# Patient Record
Sex: Female | Born: 1955 | Race: Black or African American | Hispanic: No | Marital: Married | Smoking: Never smoker
Health system: Southern US, Community
[De-identification: ages and names within clinical notes are randomized; demographics above are authoritative.]

## PROBLEM LIST (undated history)

## (undated) DIAGNOSIS — T451X5A Adverse effect of antineoplastic and immunosuppressive drugs, initial encounter: Secondary | ICD-10-CM

## (undated) DIAGNOSIS — D509 Iron deficiency anemia, unspecified: Secondary | ICD-10-CM

## (undated) DIAGNOSIS — I10 Essential (primary) hypertension: Secondary | ICD-10-CM

## (undated) DIAGNOSIS — C7800 Secondary malignant neoplasm of unspecified lung: Secondary | ICD-10-CM

## (undated) DIAGNOSIS — G620 Drug-induced polyneuropathy: Secondary | ICD-10-CM

## (undated) DIAGNOSIS — C569 Malignant neoplasm of unspecified ovary: Secondary | ICD-10-CM

## (undated) DIAGNOSIS — C541 Malignant neoplasm of endometrium: Secondary | ICD-10-CM

## (undated) DIAGNOSIS — I82409 Acute embolism and thrombosis of unspecified deep veins of unspecified lower extremity: Secondary | ICD-10-CM

## (undated) DIAGNOSIS — Z803 Family history of malignant neoplasm of breast: Secondary | ICD-10-CM

## (undated) DIAGNOSIS — R188 Other ascites: Secondary | ICD-10-CM

## (undated) DIAGNOSIS — Z86018 Personal history of other benign neoplasm: Secondary | ICD-10-CM

## (undated) DIAGNOSIS — Z8742 Personal history of other diseases of the female genital tract: Secondary | ICD-10-CM

## (undated) DIAGNOSIS — C779 Secondary and unspecified malignant neoplasm of lymph node, unspecified: Secondary | ICD-10-CM

## (undated) DIAGNOSIS — Z923 Personal history of irradiation: Secondary | ICD-10-CM

## (undated) DIAGNOSIS — Z860101 Personal history of adenomatous and serrated colon polyps: Secondary | ICD-10-CM

## (undated) DIAGNOSIS — Z86010 Personal history of colonic polyps: Secondary | ICD-10-CM

## (undated) HISTORY — DX: Essential (primary) hypertension: I10

## (undated) HISTORY — PX: UMBILICAL HERNIA REPAIR: SHX196

## (undated) HISTORY — PX: TUBAL LIGATION: SHX77

## (undated) HISTORY — DX: Personal history of irradiation: Z92.3

## (undated) HISTORY — DX: Family history of malignant neoplasm of breast: Z80.3

---

## 1997-08-11 ENCOUNTER — Ambulatory Visit (HOSPITAL_COMMUNITY): Admission: RE | Admit: 1997-08-11 | Discharge: 1997-08-11 | Payer: Self-pay | Admitting: Obstetrics & Gynecology

## 2004-12-14 ENCOUNTER — Other Ambulatory Visit: Admission: RE | Admit: 2004-12-14 | Discharge: 2004-12-14 | Payer: Self-pay | Admitting: Obstetrics and Gynecology

## 2007-02-11 ENCOUNTER — Emergency Department (HOSPITAL_COMMUNITY): Admission: EM | Admit: 2007-02-11 | Discharge: 2007-02-11 | Payer: Self-pay | Admitting: Emergency Medicine

## 2008-01-02 HISTORY — PX: ABDOMINAL HYSTERECTOMY: SHX81

## 2008-01-31 ENCOUNTER — Inpatient Hospital Stay (HOSPITAL_COMMUNITY): Admission: EM | Admit: 2008-01-31 | Discharge: 2008-02-04 | Payer: Self-pay | Admitting: Emergency Medicine

## 2008-02-23 ENCOUNTER — Ambulatory Visit: Payer: Self-pay | Admitting: Internal Medicine

## 2008-02-25 ENCOUNTER — Ambulatory Visit: Payer: Self-pay | Admitting: *Deleted

## 2008-03-03 ENCOUNTER — Other Ambulatory Visit: Payer: Self-pay | Admitting: Emergency Medicine

## 2008-03-03 ENCOUNTER — Inpatient Hospital Stay (HOSPITAL_COMMUNITY): Admission: AD | Admit: 2008-03-03 | Discharge: 2008-03-05 | Payer: Self-pay | Admitting: Obstetrics and Gynecology

## 2008-03-18 ENCOUNTER — Ambulatory Visit: Payer: Self-pay | Admitting: Obstetrics & Gynecology

## 2008-03-18 ENCOUNTER — Inpatient Hospital Stay (HOSPITAL_COMMUNITY): Admission: RE | Admit: 2008-03-18 | Discharge: 2008-03-20 | Payer: Self-pay | Admitting: Obstetrics & Gynecology

## 2008-03-18 ENCOUNTER — Encounter: Payer: Self-pay | Admitting: Obstetrics & Gynecology

## 2008-03-18 HISTORY — PX: OTHER SURGICAL HISTORY: SHX169

## 2008-03-24 ENCOUNTER — Ambulatory Visit: Payer: Self-pay | Admitting: Oncology

## 2008-03-25 ENCOUNTER — Ambulatory Visit (HOSPITAL_COMMUNITY): Admission: RE | Admit: 2008-03-25 | Discharge: 2008-03-25 | Payer: Self-pay | Admitting: Obstetrics & Gynecology

## 2008-03-25 ENCOUNTER — Inpatient Hospital Stay (HOSPITAL_COMMUNITY): Admission: AD | Admit: 2008-03-25 | Discharge: 2008-03-29 | Payer: Self-pay | Admitting: Obstetrics & Gynecology

## 2008-04-19 LAB — COMPREHENSIVE METABOLIC PANEL
Albumin: 4.2 g/dL (ref 3.5–5.2)
CO2: 23 mEq/L (ref 19–32)
Glucose, Bld: 95 mg/dL (ref 70–99)
Potassium: 3.3 mEq/L — ABNORMAL LOW (ref 3.5–5.3)
Sodium: 141 mEq/L (ref 135–145)
Total Protein: 7.3 g/dL (ref 6.0–8.3)

## 2008-04-19 LAB — CA 125: CA 125: 16.2 U/mL (ref 0.0–30.2)

## 2008-04-19 LAB — CBC WITH DIFFERENTIAL/PLATELET
Basophils Absolute: 0 10*3/uL (ref 0.0–0.1)
Eosinophils Absolute: 0.2 10*3/uL (ref 0.0–0.5)
HGB: 11.9 g/dL (ref 11.6–15.9)
LYMPH%: 16.8 % (ref 14.0–49.7)
MONO#: 0.5 10*3/uL (ref 0.1–0.9)
NEUT#: 5.1 10*3/uL (ref 1.5–6.5)
Platelets: 368 10*3/uL (ref 145–400)
RBC: 4.51 10*6/uL (ref 3.70–5.45)
RDW: 17.7 % — ABNORMAL HIGH (ref 11.2–14.5)
WBC: 6.9 10*3/uL (ref 3.9–10.3)

## 2008-04-19 LAB — IRON AND TIBC: %SAT: 16 % — ABNORMAL LOW (ref 20–55)

## 2008-04-30 LAB — CBC WITH DIFFERENTIAL/PLATELET
Basophils Absolute: 0 10*3/uL (ref 0.0–0.1)
Eosinophils Absolute: 0.2 10*3/uL (ref 0.0–0.5)
HGB: 11.1 g/dL — ABNORMAL LOW (ref 11.6–15.9)
MCV: 76.4 fL — ABNORMAL LOW (ref 79.5–101.0)
MONO#: 0 10*3/uL — ABNORMAL LOW (ref 0.1–0.9)
MONO%: 0.8 % (ref 0.0–14.0)
NEUT#: 3.6 10*3/uL (ref 1.5–6.5)
Platelets: 216 10*3/uL (ref 145–400)
RDW: 16.1 % — ABNORMAL HIGH (ref 11.2–14.5)
WBC: 5.1 10*3/uL (ref 3.9–10.3)

## 2008-05-06 ENCOUNTER — Ambulatory Visit: Payer: Self-pay | Admitting: Oncology

## 2008-05-10 LAB — COMPREHENSIVE METABOLIC PANEL
AST: 11 U/L (ref 0–37)
Albumin: 3.9 g/dL (ref 3.5–5.2)
Alkaline Phosphatase: 99 U/L (ref 39–117)
Calcium: 9.3 mg/dL (ref 8.4–10.5)
Chloride: 107 mEq/L (ref 96–112)
Glucose, Bld: 102 mg/dL — ABNORMAL HIGH (ref 70–99)
Potassium: 3.4 mEq/L — ABNORMAL LOW (ref 3.5–5.3)
Sodium: 143 mEq/L (ref 135–145)
Total Protein: 6.8 g/dL (ref 6.0–8.3)

## 2008-05-10 LAB — CBC WITH DIFFERENTIAL/PLATELET
Basophils Absolute: 0.1 10*3/uL (ref 0.0–0.1)
EOS%: 2.5 % (ref 0.0–7.0)
HCT: 32 % — ABNORMAL LOW (ref 34.8–46.6)
HGB: 10.7 g/dL — ABNORMAL LOW (ref 11.6–15.9)
MCH: 25.8 pg (ref 25.1–34.0)
MONO#: 0.6 10*3/uL (ref 0.1–0.9)
NEUT#: 1.3 10*3/uL — ABNORMAL LOW (ref 1.5–6.5)
NEUT%: 36 % — ABNORMAL LOW (ref 38.4–76.8)
RDW: 16.7 % — ABNORMAL HIGH (ref 11.2–14.5)
WBC: 3.6 10*3/uL — ABNORMAL LOW (ref 3.9–10.3)
lymph#: 1.5 10*3/uL (ref 0.9–3.3)

## 2008-05-14 LAB — CBC WITH DIFFERENTIAL/PLATELET
BASO%: 0 % (ref 0.0–2.0)
Basophils Absolute: 0 10*3/uL (ref 0.0–0.1)
EOS%: 0 % (ref 0.0–7.0)
MCH: 26.1 pg (ref 25.1–34.0)
MCHC: 34.2 g/dL (ref 31.5–36.0)
MCV: 76.4 fL — ABNORMAL LOW (ref 79.5–101.0)
MONO%: 1.3 % (ref 0.0–14.0)
RBC: 4.67 10*6/uL (ref 3.70–5.45)
RDW: 16.6 % — ABNORMAL HIGH (ref 11.2–14.5)
lymph#: 1.7 10*3/uL (ref 0.9–3.3)

## 2008-05-14 LAB — BASIC METABOLIC PANEL
BUN: 15 mg/dL (ref 6–23)
Potassium: 3.5 mEq/L (ref 3.5–5.3)
Sodium: 139 mEq/L (ref 135–145)

## 2008-06-01 LAB — CBC WITH DIFFERENTIAL/PLATELET
BASO%: 0.7 % (ref 0.0–2.0)
LYMPH%: 28.5 % (ref 14.0–49.7)
MCHC: 34.1 g/dL (ref 31.5–36.0)
MONO#: 0.7 10*3/uL (ref 0.1–0.9)
Platelets: 321 10*3/uL (ref 145–400)
RBC: 3.69 10*6/uL — ABNORMAL LOW (ref 3.70–5.45)
RDW: 20.1 % — ABNORMAL HIGH (ref 11.2–14.5)
WBC: 5.5 10*3/uL (ref 3.9–10.3)

## 2008-06-01 LAB — COMPREHENSIVE METABOLIC PANEL
ALT: 14 U/L (ref 0–35)
Alkaline Phosphatase: 90 U/L (ref 39–117)
CO2: 23 mEq/L (ref 19–32)
Potassium: 3.3 mEq/L — ABNORMAL LOW (ref 3.5–5.3)
Sodium: 142 mEq/L (ref 135–145)
Total Bilirubin: 0.4 mg/dL (ref 0.3–1.2)
Total Protein: 7 g/dL (ref 6.0–8.3)

## 2008-06-01 LAB — CA 125: CA 125: 5.1 U/mL (ref 0.0–30.2)

## 2008-06-10 ENCOUNTER — Ambulatory Visit: Payer: Self-pay | Admitting: Obstetrics & Gynecology

## 2008-06-16 ENCOUNTER — Ambulatory Visit (HOSPITAL_COMMUNITY): Admission: RE | Admit: 2008-06-16 | Discharge: 2008-06-16 | Payer: Self-pay | Admitting: Oncology

## 2008-06-21 ENCOUNTER — Ambulatory Visit: Payer: Self-pay | Admitting: Oncology

## 2008-06-23 LAB — CA 125: CA 125: 4.5 U/mL (ref 0.0–30.2)

## 2008-06-23 LAB — COMPREHENSIVE METABOLIC PANEL
ALT: 12 U/L (ref 0–35)
BUN: 18 mg/dL (ref 6–23)
CO2: 21 mEq/L (ref 19–32)
Calcium: 9.5 mg/dL (ref 8.4–10.5)
Chloride: 106 mEq/L (ref 96–112)
Creatinine, Ser: 1.22 mg/dL — ABNORMAL HIGH (ref 0.40–1.20)
Glucose, Bld: 102 mg/dL — ABNORMAL HIGH (ref 70–99)
Total Bilirubin: 0.4 mg/dL (ref 0.3–1.2)

## 2008-06-23 LAB — CBC WITH DIFFERENTIAL/PLATELET
BASO%: 0.8 % (ref 0.0–2.0)
Basophils Absolute: 0 10*3/uL (ref 0.0–0.1)
Eosinophils Absolute: 0.1 10*3/uL (ref 0.0–0.5)
HCT: 28.9 % — ABNORMAL LOW (ref 34.8–46.6)
HGB: 10.4 g/dL — ABNORMAL LOW (ref 11.6–15.9)
LYMPH%: 35.1 % (ref 14.0–49.7)
MCHC: 36.1 g/dL — ABNORMAL HIGH (ref 31.5–36.0)
MONO#: 0.6 10*3/uL (ref 0.1–0.9)
NEUT%: 50.8 % (ref 38.4–76.8)
Platelets: 251 10*3/uL (ref 145–400)
WBC: 4.9 10*3/uL (ref 3.9–10.3)
lymph#: 1.7 10*3/uL (ref 0.9–3.3)

## 2008-07-12 LAB — CBC WITH DIFFERENTIAL/PLATELET
BASO%: 0.5 % (ref 0.0–2.0)
Eosinophils Absolute: 0 10*3/uL (ref 0.0–0.5)
LYMPH%: 32.1 % (ref 14.0–49.7)
MONO#: 0.8 10*3/uL (ref 0.1–0.9)
NEUT#: 2.6 10*3/uL (ref 1.5–6.5)
Platelets: 326 10*3/uL (ref 145–400)
RBC: 3.06 10*6/uL — ABNORMAL LOW (ref 3.70–5.45)
WBC: 5.1 10*3/uL (ref 3.9–10.3)
lymph#: 1.6 10*3/uL (ref 0.9–3.3)

## 2008-07-12 LAB — COMPREHENSIVE METABOLIC PANEL
Albumin: 4 g/dL (ref 3.5–5.2)
Alkaline Phosphatase: 85 U/L (ref 39–117)
BUN: 22 mg/dL (ref 6–23)
CO2: 22 mEq/L (ref 19–32)
Calcium: 9.5 mg/dL (ref 8.4–10.5)
Chloride: 106 mEq/L (ref 96–112)
Glucose, Bld: 97 mg/dL (ref 70–99)
Potassium: 3.4 mEq/L — ABNORMAL LOW (ref 3.5–5.3)
Sodium: 140 mEq/L (ref 135–145)
Total Protein: 6.7 g/dL (ref 6.0–8.3)

## 2008-07-12 LAB — CA 125: CA 125: 4.2 U/mL (ref 0.0–30.2)

## 2008-08-02 ENCOUNTER — Ambulatory Visit: Payer: Self-pay | Admitting: Oncology

## 2008-08-04 LAB — CBC WITH DIFFERENTIAL/PLATELET
BASO%: 0.3 % (ref 0.0–2.0)
EOS%: 0.9 % (ref 0.0–7.0)
Eosinophils Absolute: 0 10*3/uL (ref 0.0–0.5)
LYMPH%: 25.6 % (ref 14.0–49.7)
MCHC: 34.9 g/dL (ref 31.5–36.0)
MCV: 87.9 fL (ref 79.5–101.0)
MONO%: 15.3 % — ABNORMAL HIGH (ref 0.0–14.0)
NEUT#: 2.3 10*3/uL (ref 1.5–6.5)
Platelets: 343 10*3/uL (ref 145–400)
RBC: 3.17 10*6/uL — ABNORMAL LOW (ref 3.70–5.45)
RDW: 20.5 % — ABNORMAL HIGH (ref 11.2–14.5)
WBC: 4 10*3/uL (ref 3.9–10.3)

## 2008-08-04 LAB — COMPREHENSIVE METABOLIC PANEL
AST: 12 U/L (ref 0–37)
Alkaline Phosphatase: 99 U/L (ref 39–117)
BUN: 11 mg/dL (ref 6–23)
Creatinine, Ser: 1.14 mg/dL (ref 0.40–1.20)
Potassium: 3.4 mEq/L — ABNORMAL LOW (ref 3.5–5.3)
Total Bilirubin: 0.6 mg/dL (ref 0.3–1.2)

## 2008-08-13 LAB — COMPREHENSIVE METABOLIC PANEL
Albumin: 3.9 g/dL (ref 3.5–5.2)
Alkaline Phosphatase: 85 U/L (ref 39–117)
BUN: 16 mg/dL (ref 6–23)
CO2: 21 mEq/L (ref 19–32)
Glucose, Bld: 101 mg/dL — ABNORMAL HIGH (ref 70–99)
Potassium: 3.5 mEq/L (ref 3.5–5.3)
Total Bilirubin: 0.4 mg/dL (ref 0.3–1.2)

## 2008-08-13 LAB — CBC WITH DIFFERENTIAL/PLATELET
Basophils Absolute: 0 10*3/uL (ref 0.0–0.1)
Eosinophils Absolute: 0 10*3/uL (ref 0.0–0.5)
HGB: 8.9 g/dL — ABNORMAL LOW (ref 11.6–15.9)
LYMPH%: 30.5 % (ref 14.0–49.7)
MCV: 89.2 fL (ref 79.5–101.0)
MONO%: 2.1 % (ref 0.0–14.0)
NEUT#: 2.1 10*3/uL (ref 1.5–6.5)
Platelets: 200 10*3/uL (ref 145–400)

## 2008-08-13 LAB — CA 125: CA 125: 3.4 U/mL (ref 0.0–30.2)

## 2008-08-16 ENCOUNTER — Encounter: Payer: Self-pay | Admitting: Nurse Practitioner

## 2008-09-08 ENCOUNTER — Ambulatory Visit (HOSPITAL_COMMUNITY): Admission: RE | Admit: 2008-09-08 | Discharge: 2008-09-08 | Payer: Self-pay | Admitting: Oncology

## 2008-09-08 ENCOUNTER — Encounter: Payer: Self-pay | Admitting: Nurse Practitioner

## 2008-09-13 ENCOUNTER — Ambulatory Visit: Payer: Self-pay | Admitting: Oncology

## 2008-09-15 ENCOUNTER — Ambulatory Visit: Admission: RE | Admit: 2008-09-15 | Discharge: 2008-09-15 | Payer: Self-pay | Admitting: Gynecologic Oncology

## 2008-09-15 ENCOUNTER — Encounter: Payer: Self-pay | Admitting: Nurse Practitioner

## 2008-09-15 LAB — CBC WITH DIFFERENTIAL/PLATELET
BASO%: 0.5 % (ref 0.0–2.0)
Basophils Absolute: 0 10*3/uL (ref 0.0–0.1)
EOS%: 1.8 % (ref 0.0–7.0)
MCH: 30.6 pg (ref 25.1–34.0)
MCHC: 34.7 g/dL (ref 31.5–36.0)
MCV: 88.2 fL (ref 79.5–101.0)
MONO%: 10 % (ref 0.0–14.0)
NEUT%: 57.5 % (ref 38.4–76.8)
RDW: 14.6 % — ABNORMAL HIGH (ref 11.2–14.5)
lymph#: 1.7 10*3/uL (ref 0.9–3.3)

## 2008-09-21 ENCOUNTER — Encounter: Payer: Self-pay | Admitting: Nurse Practitioner

## 2008-09-21 ENCOUNTER — Telehealth: Payer: Self-pay | Admitting: Gastroenterology

## 2008-09-21 ENCOUNTER — Ambulatory Visit (HOSPITAL_COMMUNITY): Admission: RE | Admit: 2008-09-21 | Discharge: 2008-09-21 | Payer: Self-pay | Admitting: Gynecologic Oncology

## 2008-09-23 ENCOUNTER — Ambulatory Visit: Payer: Self-pay | Admitting: Gastroenterology

## 2008-09-23 DIAGNOSIS — I10 Essential (primary) hypertension: Secondary | ICD-10-CM | POA: Insufficient documentation

## 2008-09-23 DIAGNOSIS — R933 Abnormal findings on diagnostic imaging of other parts of digestive tract: Secondary | ICD-10-CM

## 2008-09-24 ENCOUNTER — Telehealth (INDEPENDENT_AMBULATORY_CARE_PROVIDER_SITE_OTHER): Payer: Self-pay | Admitting: *Deleted

## 2008-09-27 ENCOUNTER — Encounter: Payer: Self-pay | Admitting: Gastroenterology

## 2008-09-27 ENCOUNTER — Ambulatory Visit: Payer: Self-pay | Admitting: Nurse Practitioner

## 2008-09-27 ENCOUNTER — Ambulatory Visit: Payer: Self-pay | Admitting: Gastroenterology

## 2008-09-27 HISTORY — PX: COLONOSCOPY W/ POLYPECTOMY: SHX1380

## 2008-09-27 LAB — CONVERTED CEMR LAB: CEA: 0.8 ng/mL (ref 0.0–5.0)

## 2008-09-29 ENCOUNTER — Encounter: Payer: Self-pay | Admitting: Gastroenterology

## 2008-10-05 ENCOUNTER — Ambulatory Visit: Admission: RE | Admit: 2008-10-05 | Discharge: 2008-11-28 | Payer: Self-pay | Admitting: Radiation Oncology

## 2008-10-07 ENCOUNTER — Encounter (HOSPITAL_COMMUNITY): Admission: RE | Admit: 2008-10-07 | Discharge: 2008-12-30 | Payer: Self-pay | Admitting: Oncology

## 2008-10-07 LAB — CBC WITH DIFFERENTIAL/PLATELET
BASO%: 0.3 % (ref 0.0–2.0)
Basophils Absolute: 0 10*3/uL (ref 0.0–0.1)
EOS%: 1.9 % (ref 0.0–7.0)
HGB: 6.9 g/dL — CL (ref 11.6–15.9)
MCH: 30.8 pg (ref 25.1–34.0)
RBC: 2.24 10*6/uL — ABNORMAL LOW (ref 3.70–5.45)
RDW: 14.9 % — ABNORMAL HIGH (ref 11.2–14.5)
lymph#: 1.8 10*3/uL (ref 0.9–3.3)
nRBC: 0 % (ref 0–0)

## 2008-10-07 LAB — COMPREHENSIVE METABOLIC PANEL
ALT: 24 U/L (ref 0–35)
AST: 27 U/L (ref 0–37)
Alkaline Phosphatase: 84 U/L (ref 39–117)
CO2: 21 mEq/L (ref 19–32)
Creatinine, Ser: 1.12 mg/dL (ref 0.40–1.20)
Sodium: 140 mEq/L (ref 135–145)
Total Bilirubin: 0.3 mg/dL (ref 0.3–1.2)
Total Protein: 6.2 g/dL (ref 6.0–8.3)

## 2008-10-07 LAB — CA 125: CA 125: 4.8 U/mL (ref 0.0–30.2)

## 2008-10-11 ENCOUNTER — Ambulatory Visit: Payer: Self-pay | Admitting: Oncology

## 2008-10-13 LAB — CBC WITH DIFFERENTIAL/PLATELET
BASO%: 0.8 % (ref 0.0–2.0)
Basophils Absolute: 0 10*3/uL (ref 0.0–0.1)
EOS%: 1.8 % (ref 0.0–7.0)
HCT: 30.5 % — ABNORMAL LOW (ref 34.8–46.6)
HGB: 10.6 g/dL — ABNORMAL LOW (ref 11.6–15.9)
LYMPH%: 15.5 % (ref 14.0–49.7)
MCH: 31.6 pg (ref 25.1–34.0)
MCHC: 34.8 g/dL (ref 31.5–36.0)
MCV: 90.8 fL (ref 79.5–101.0)
NEUT%: 72.7 % (ref 38.4–76.8)
Platelets: 274 10*3/uL (ref 145–400)

## 2008-11-30 ENCOUNTER — Ambulatory Visit: Payer: Self-pay | Admitting: Oncology

## 2008-12-06 ENCOUNTER — Ambulatory Visit: Admission: RE | Admit: 2008-12-06 | Discharge: 2008-12-31 | Payer: Self-pay | Admitting: Radiation Oncology

## 2008-12-06 LAB — CBC WITH DIFFERENTIAL/PLATELET
Eosinophils Absolute: 0.2 10*3/uL (ref 0.0–0.5)
MCH: 31.5 pg (ref 25.1–34.0)
MCHC: 35.4 g/dL (ref 31.5–36.0)
MCV: 88.8 fL (ref 79.5–101.0)
Platelets: 218 10*3/uL (ref 145–400)
RBC: 3.18 10*6/uL — ABNORMAL LOW (ref 3.70–5.45)
RDW: 15.1 % — ABNORMAL HIGH (ref 11.2–14.5)

## 2008-12-06 LAB — COMPREHENSIVE METABOLIC PANEL
ALT: 21 U/L (ref 0–35)
Albumin: 3.9 g/dL (ref 3.5–5.2)
BUN: 21 mg/dL (ref 6–23)
CO2: 22 mEq/L (ref 19–32)
Calcium: 9.1 mg/dL (ref 8.4–10.5)
Chloride: 105 mEq/L (ref 96–112)
Creatinine, Ser: 1.2 mg/dL (ref 0.40–1.20)

## 2008-12-06 LAB — CA 125: CA 125: 4.5 U/mL (ref 0.0–30.2)

## 2009-01-28 ENCOUNTER — Ambulatory Visit: Payer: Self-pay | Admitting: Oncology

## 2009-02-11 LAB — CBC WITH DIFFERENTIAL/PLATELET
Basophils Absolute: 0 10*3/uL (ref 0.0–0.1)
Eosinophils Absolute: 0.2 10*3/uL (ref 0.0–0.5)
HCT: 30.3 % — ABNORMAL LOW (ref 34.8–46.6)
HGB: 10.4 g/dL — ABNORMAL LOW (ref 11.6–15.9)
MCV: 88.7 fL (ref 79.5–101.0)
MONO%: 11.5 % (ref 0.0–14.0)
NEUT#: 2.8 10*3/uL (ref 1.5–6.5)
NEUT%: 70.1 % (ref 38.4–76.8)
RDW: 15.2 % — ABNORMAL HIGH (ref 11.2–14.5)

## 2009-02-11 LAB — COMPREHENSIVE METABOLIC PANEL
Albumin: 4 g/dL (ref 3.5–5.2)
Alkaline Phosphatase: 80 U/L (ref 39–117)
BUN: 15 mg/dL (ref 6–23)
Calcium: 8.9 mg/dL (ref 8.4–10.5)
Chloride: 107 mEq/L (ref 96–112)
Creatinine, Ser: 1.07 mg/dL (ref 0.40–1.20)
Glucose, Bld: 101 mg/dL — ABNORMAL HIGH (ref 70–99)
Potassium: 3.5 mEq/L (ref 3.5–5.3)

## 2009-02-25 ENCOUNTER — Ambulatory Visit: Payer: Self-pay | Admitting: Oncology

## 2009-03-08 ENCOUNTER — Ambulatory Visit: Admission: RE | Admit: 2009-03-08 | Discharge: 2009-03-08 | Payer: Self-pay | Admitting: Radiation Oncology

## 2009-03-08 ENCOUNTER — Other Ambulatory Visit: Admission: RE | Admit: 2009-03-08 | Discharge: 2009-03-08 | Payer: Self-pay | Admitting: Radiation Oncology

## 2009-04-06 ENCOUNTER — Ambulatory Visit: Payer: Self-pay | Admitting: Oncology

## 2009-04-25 LAB — CBC WITH DIFFERENTIAL/PLATELET
BASO%: 0.7 % (ref 0.0–2.0)
Basophils Absolute: 0 10*3/uL (ref 0.0–0.1)
Eosinophils Absolute: 0.2 10*3/uL (ref 0.0–0.5)
HCT: 30.3 % — ABNORMAL LOW (ref 34.8–46.6)
HGB: 10.5 g/dL — ABNORMAL LOW (ref 11.6–15.9)
LYMPH%: 11.9 % — ABNORMAL LOW (ref 14.0–49.7)
MCHC: 34.7 g/dL (ref 31.5–36.0)
MONO#: 0.5 10*3/uL (ref 0.1–0.9)
NEUT#: 3.8 10*3/uL (ref 1.5–6.5)
NEUT%: 74 % (ref 38.4–76.8)
Platelets: 224 10*3/uL (ref 145–400)
WBC: 5.1 10*3/uL (ref 3.9–10.3)
lymph#: 0.6 10*3/uL — ABNORMAL LOW (ref 0.9–3.3)

## 2009-05-11 ENCOUNTER — Ambulatory Visit: Admission: RE | Admit: 2009-05-11 | Discharge: 2009-05-11 | Payer: Self-pay | Admitting: Gynecologic Oncology

## 2009-05-11 ENCOUNTER — Other Ambulatory Visit: Admission: RE | Admit: 2009-05-11 | Discharge: 2009-05-11 | Payer: Self-pay | Admitting: Gynecologic Oncology

## 2009-06-02 ENCOUNTER — Ambulatory Visit: Payer: Self-pay | Admitting: Oncology

## 2009-07-20 ENCOUNTER — Ambulatory Visit: Payer: Self-pay | Admitting: Oncology

## 2009-07-22 LAB — CBC WITH DIFFERENTIAL/PLATELET
BASO%: 0 % (ref 0.0–2.0)
HCT: 31.3 % — ABNORMAL LOW (ref 34.8–46.6)
LYMPH%: 26.1 % (ref 14.0–49.7)
MCH: 29.3 pg (ref 25.1–34.0)
MCHC: 34.7 g/dL (ref 31.5–36.0)
MCV: 84.5 fL (ref 79.5–101.0)
MONO#: 0.2 10*3/uL (ref 0.1–0.9)
MONO%: 3.6 % (ref 0.0–14.0)
NEUT%: 67.6 % (ref 38.4–76.8)
Platelets: 214 10*3/uL (ref 145–400)

## 2009-07-22 LAB — COMPREHENSIVE METABOLIC PANEL
ALT: 13 U/L (ref 0–35)
Alkaline Phosphatase: 92 U/L (ref 39–117)
CO2: 21 mEq/L (ref 19–32)
Creatinine, Ser: 1.08 mg/dL (ref 0.40–1.20)
Total Bilirubin: 0.4 mg/dL (ref 0.3–1.2)

## 2009-07-22 LAB — CA 125: CA 125: 4.4 U/mL (ref 0.0–30.2)

## 2009-08-02 ENCOUNTER — Ambulatory Visit (HOSPITAL_COMMUNITY): Admission: RE | Admit: 2009-08-02 | Discharge: 2009-08-02 | Payer: Self-pay | Admitting: Oncology

## 2009-09-06 ENCOUNTER — Ambulatory Visit: Payer: Self-pay | Admitting: Oncology

## 2009-09-08 ENCOUNTER — Encounter (INDEPENDENT_AMBULATORY_CARE_PROVIDER_SITE_OTHER): Payer: Self-pay | Admitting: *Deleted

## 2009-09-09 LAB — CBC WITH DIFFERENTIAL/PLATELET
EOS%: 2.8 % (ref 0.0–7.0)
HGB: 10.8 g/dL — ABNORMAL LOW (ref 11.6–15.9)
MCH: 27.5 pg (ref 25.1–34.0)
MCV: 82.4 fL (ref 79.5–101.0)
MONO%: 8.3 % (ref 0.0–14.0)
NEUT#: 3.2 10*3/uL (ref 1.5–6.5)
RBC: 3.93 10*6/uL (ref 3.70–5.45)
RDW: 15.5 % — ABNORMAL HIGH (ref 11.2–14.5)
lymph#: 1.5 10*3/uL (ref 0.9–3.3)
nRBC: 0 % (ref 0–0)

## 2009-09-09 LAB — COMPREHENSIVE METABOLIC PANEL
ALT: 12 U/L (ref 0–35)
AST: 15 U/L (ref 0–37)
Albumin: 3.9 g/dL (ref 3.5–5.2)
Alkaline Phosphatase: 108 U/L (ref 39–117)
Potassium: 3.5 mEq/L (ref 3.5–5.3)
Sodium: 141 mEq/L (ref 135–145)
Total Bilirubin: 0.3 mg/dL (ref 0.3–1.2)
Total Protein: 6.5 g/dL (ref 6.0–8.3)

## 2009-09-12 ENCOUNTER — Ambulatory Visit: Admission: RE | Admit: 2009-09-12 | Discharge: 2009-09-12 | Payer: Self-pay | Admitting: Radiation Oncology

## 2009-09-12 ENCOUNTER — Other Ambulatory Visit: Admission: RE | Admit: 2009-09-12 | Discharge: 2009-09-12 | Payer: Self-pay | Admitting: Radiation Oncology

## 2009-09-14 ENCOUNTER — Ambulatory Visit (HOSPITAL_COMMUNITY): Admission: RE | Admit: 2009-09-14 | Discharge: 2009-09-14 | Payer: Self-pay | Admitting: Oncology

## 2009-11-02 ENCOUNTER — Ambulatory Visit: Payer: Self-pay | Admitting: Oncology

## 2009-11-10 ENCOUNTER — Ambulatory Visit
Admission: RE | Admit: 2009-11-10 | Discharge: 2009-11-10 | Payer: Self-pay | Source: Home / Self Care | Admitting: Gynecologic Oncology

## 2009-12-06 ENCOUNTER — Ambulatory Visit (HOSPITAL_COMMUNITY)
Admission: RE | Admit: 2009-12-06 | Discharge: 2009-12-06 | Payer: Self-pay | Source: Home / Self Care | Attending: Gynecologic Oncology | Admitting: Gynecologic Oncology

## 2009-12-28 ENCOUNTER — Ambulatory Visit: Payer: Self-pay | Admitting: Oncology

## 2010-01-22 ENCOUNTER — Encounter: Payer: Self-pay | Admitting: Internal Medicine

## 2010-01-31 NOTE — Letter (Signed)
Summary: Colonoscopy Letter  [facility]  [address] [place], [place] [address]   Phone: [phone]  Fax: [phone]      September 08, 2009 MRN: [id number]   DIANDRA CIMINI [address] [place], [place]  [address]   Dear Ms. WORTHINGTON,   According to your medical record, it is time for you to schedule a Colonoscopy. The American Cancer Society recommends this procedure as a method to detect early colon cancer. Patients with a family history of colon cancer, or a personal history of colon polyps or inflammatory bowel disease are at increased risk.  This letter has beeen generated based on the recommendations made at the time of your procedure. If you feel that in your particular situation this may no longer apply, please contact our office.  Please call our office at [phone] to schedule this appointment or to update your records at your earliest convenience.  Thank you for cooperating with [place] to provide you with the very best care possible.   Sincerely,   Vania Rea. Jarold Motto, M.D.  [facility] [phone]

## 2010-02-20 ENCOUNTER — Encounter (HOSPITAL_BASED_OUTPATIENT_CLINIC_OR_DEPARTMENT_OTHER): Payer: Self-pay | Admitting: Oncology

## 2010-02-20 ENCOUNTER — Other Ambulatory Visit: Payer: Self-pay | Admitting: Oncology

## 2010-02-20 DIAGNOSIS — G5790 Unspecified mononeuropathy of unspecified lower limb: Secondary | ICD-10-CM

## 2010-02-20 DIAGNOSIS — C549 Malignant neoplasm of corpus uteri, unspecified: Secondary | ICD-10-CM

## 2010-02-20 DIAGNOSIS — C569 Malignant neoplasm of unspecified ovary: Secondary | ICD-10-CM

## 2010-02-20 LAB — CBC WITH DIFFERENTIAL/PLATELET
Basophils Absolute: 0 10*3/uL (ref 0.0–0.1)
EOS%: 3 % (ref 0.0–7.0)
HCT: 31.3 % — ABNORMAL LOW (ref 34.8–46.6)
HGB: 10.7 g/dL — ABNORMAL LOW (ref 11.6–15.9)
MONO#: 0.4 10*3/uL (ref 0.1–0.9)
NEUT#: 3 10*3/uL (ref 1.5–6.5)
NEUT%: 63.2 % (ref 38.4–76.8)
RDW: 16 % — ABNORMAL HIGH (ref 11.2–14.5)
WBC: 4.8 10*3/uL (ref 3.9–10.3)
lymph#: 1.2 10*3/uL (ref 0.9–3.3)

## 2010-02-20 LAB — COMPREHENSIVE METABOLIC PANEL
ALT: 25 U/L (ref 0–35)
AST: 26 U/L (ref 0–37)
Albumin: 3.9 g/dL (ref 3.5–5.2)
BUN: 12 mg/dL (ref 6–23)
CO2: 24 mEq/L (ref 19–32)
Calcium: 9.1 mg/dL (ref 8.4–10.5)
Chloride: 107 mEq/L (ref 96–112)
Creatinine, Ser: 0.89 mg/dL (ref 0.40–1.20)
Potassium: 3.3 mEq/L — ABNORMAL LOW (ref 3.5–5.3)

## 2010-02-20 LAB — CA 125: CA 125: 3.2 U/mL (ref 0.0–30.2)

## 2010-03-14 LAB — CA 125: CA 125: 3.1 U/mL (ref 0.0–30.2)

## 2010-03-14 LAB — BUN: BUN: 16 mg/dL (ref 6–23)

## 2010-04-06 LAB — CROSSMATCH

## 2010-04-13 LAB — CROSSMATCH
ABO/RH(D): O POS
ABO/RH(D): O POS
Antibody Screen: NEGATIVE
Antibody Screen: NEGATIVE
Antibody Screen: NEGATIVE

## 2010-04-13 LAB — POCT I-STAT, CHEM 8
BUN: 9 mg/dL (ref 6–23)
Calcium, Ion: 1.11 mmol/L — ABNORMAL LOW (ref 1.12–1.32)
Chloride: 105 mEq/L (ref 96–112)
HCT: 22 % — ABNORMAL LOW (ref 36.0–46.0)
Potassium: 3.4 mEq/L — ABNORMAL LOW (ref 3.5–5.1)

## 2010-04-13 LAB — CBC
HCT: 22.3 % — ABNORMAL LOW (ref 36.0–46.0)
HCT: 25.1 % — ABNORMAL LOW (ref 36.0–46.0)
HCT: 28 % — ABNORMAL LOW (ref 36.0–46.0)
HCT: 32.2 % — ABNORMAL LOW (ref 36.0–46.0)
HCT: 22.3 % — ABNORMAL LOW (ref 36.0–46.0)
HCT: 34.7 % — ABNORMAL LOW (ref 36.0–46.0)
Hemoglobin: 10.4 g/dL — ABNORMAL LOW (ref 12.0–15.0)
Hemoglobin: 10.7 g/dL — ABNORMAL LOW (ref 12.0–15.0)
Hemoglobin: 7.4 g/dL — CL (ref 12.0–15.0)
Hemoglobin: 9.2 g/dL — ABNORMAL LOW (ref 12.0–15.0)
Hemoglobin: 4.2 g/dL — CL (ref 12.0–15.0)
Hemoglobin: 11.5 g/dL — ABNORMAL LOW (ref 12.0–15.0)
Hemoglobin: 7.3 g/dL — CL (ref 12.0–15.0)
MCHC: 32.3 g/dL (ref 30.0–36.0)
MCHC: 32.7 g/dL (ref 30.0–36.0)
MCHC: 33 g/dL (ref 30.0–36.0)
MCHC: 33.2 g/dL (ref 30.0–36.0)
MCHC: 33.3 g/dL (ref 30.0–36.0)
MCV: 84 fL (ref 78.0–100.0)
MCV: 86.4 fL (ref 78.0–100.0)
MCV: 86.6 fL (ref 78.0–100.0)
MCV: 82.7 fL (ref 78.0–100.0)
Platelets: 310 10*3/uL (ref 150–400)
Platelets: 415 10*3/uL — ABNORMAL HIGH (ref 150–400)
Platelets: 719 10*3/uL — ABNORMAL HIGH (ref 150–400)
Platelets: 726 10*3/uL — ABNORMAL HIGH (ref 150–400)
Platelets: 362 10*3/uL (ref 150–400)
RBC: 2.71 MIL/uL — ABNORMAL LOW (ref 3.87–5.11)
RBC: 3.36 MIL/uL — ABNORMAL LOW (ref 3.87–5.11)
RBC: 3.72 MIL/uL — ABNORMAL LOW (ref 3.87–5.11)
RBC: 1.73 MIL/uL — ABNORMAL LOW (ref 3.87–5.11)
RBC: 4.03 MIL/uL (ref 3.87–5.11)
RDW: 16.2 % — ABNORMAL HIGH (ref 11.5–15.5)
RDW: 16.4 % — ABNORMAL HIGH (ref 11.5–15.5)
RDW: 16.7 % — ABNORMAL HIGH (ref 11.5–15.5)
RDW: 16.9 % — ABNORMAL HIGH (ref 11.5–15.5)
RDW: 17.1 % — ABNORMAL HIGH (ref 11.5–15.5)
RDW: 18.1 % — ABNORMAL HIGH (ref 11.5–15.5)
RDW: 17.6 % — ABNORMAL HIGH (ref 11.5–15.5)
RDW: 22.9 % — ABNORMAL HIGH (ref 11.5–15.5)
WBC: 10.4 10*3/uL (ref 4.0–10.5)
WBC: 16.2 10*3/uL — ABNORMAL HIGH (ref 4.0–10.5)
WBC: 9 10*3/uL (ref 4.0–10.5)
WBC: 13.5 10*3/uL — ABNORMAL HIGH (ref 4.0–10.5)

## 2010-04-13 LAB — COMPREHENSIVE METABOLIC PANEL
ALT: 10 U/L (ref 0–35)
Alkaline Phosphatase: 73 U/L (ref 39–117)
BUN: 8 mg/dL (ref 6–23)
BUN: 4 mg/dL — ABNORMAL LOW (ref 6–23)
CO2: 23 mEq/L (ref 19–32)
CO2: 26 mEq/L (ref 19–32)
Calcium: 8.2 mg/dL — ABNORMAL LOW (ref 8.4–10.5)
Creatinine, Ser: 0.76 mg/dL (ref 0.4–1.2)
GFR calc non Af Amer: >60 mL/min (ref >60)
GFR calc non Af Amer: >60 mL/min (ref >60)
Glucose, Bld: 107 mg/dL — ABNORMAL HIGH (ref 70–99)
Glucose, Bld: 115 mg/dL — ABNORMAL HIGH (ref 70–99)
Potassium: 3 mEq/L — ABNORMAL LOW (ref 3.5–5.1)
Sodium: 136 mEq/L (ref 135–145)
Total Protein: 5.2 g/dL — ABNORMAL LOW (ref 6.0–8.3)

## 2010-04-13 LAB — URINALYSIS, ROUTINE W REFLEX MICROSCOPIC
Nitrite: NEGATIVE
Specific Gravity, Urine: 1.031 — ABNORMAL HIGH (ref 1.005–1.030)
Urobilinogen, UA: 0.2 mg/dL (ref 0.0–1.0)

## 2010-04-13 LAB — BASIC METABOLIC PANEL
BUN: 2 mg/dL — ABNORMAL LOW (ref 6–23)
CO2: 20 mEq/L (ref 19–32)
CO2: 22 mEq/L (ref 19–32)
Calcium: 8.6 mg/dL (ref 8.4–10.5)
Chloride: 109 mEq/L (ref 96–112)
Creatinine, Ser: 0.67 mg/dL (ref 0.4–1.2)
Creatinine, Ser: 0.87 mg/dL (ref 0.4–1.2)
GFR calc Af Amer: >60 mL/min (ref >60)
Glucose, Bld: 107 mg/dL — ABNORMAL HIGH (ref 70–99)

## 2010-04-13 LAB — URINE CULTURE

## 2010-04-13 LAB — DIFFERENTIAL
Basophils Relative: 1 % (ref 0–1)
Eosinophils Absolute: 0.2 10*3/uL (ref 0.0–0.7)
Eosinophils Relative: 1 % (ref 0–5)
Lymphocytes Relative: 6 % — ABNORMAL LOW (ref 12–46)
Neutrophils Relative %: 87 % — ABNORMAL HIGH (ref 43–77)

## 2010-04-13 LAB — POCT PREGNANCY, URINE: Preg Test, Ur: NEGATIVE

## 2010-04-13 LAB — ABO/RH: ABO/RH(D): O POS

## 2010-04-13 LAB — URINE MICROSCOPIC-ADD ON

## 2010-04-13 LAB — PROTIME-INR
INR: 1.2 (ref 0.00–1.49)
Prothrombin Time: 15.2 seconds (ref 11.6–15.2)

## 2010-04-13 LAB — APTT: aPTT: 27 seconds (ref 24–37)

## 2010-04-13 LAB — PREPARE RBC (CROSSMATCH)

## 2010-04-13 LAB — ELECTROLYTE PANEL: Sodium: 139 mEq/L (ref 135–145)

## 2010-04-17 LAB — CROSSMATCH
ABO/RH(D): O POS
Antibody Screen: NEGATIVE

## 2010-04-17 LAB — RETICULOCYTES
RBC.: 3.87 MIL/uL (ref 3.87–5.11)
Retic Count, Absolute: 34.8 10*3/uL (ref 19.0–186.0)

## 2010-04-17 LAB — DIFFERENTIAL
Basophils Absolute: 0 10*3/uL (ref 0.0–0.1)
Eosinophils Absolute: 0.2 10*3/uL (ref 0.0–0.7)
Lymphs Abs: 1.3 10*3/uL (ref 0.7–4.0)
Monocytes Absolute: 0.8 10*3/uL (ref 0.1–1.0)
Neutro Abs: 8.9 10*3/uL — ABNORMAL HIGH (ref 1.7–7.7)

## 2010-04-17 LAB — ABO/RH: ABO/RH(D): O POS

## 2010-04-17 LAB — CBC
Hemoglobin: 5 g/dL — CL (ref 12.0–15.0)
MCV: 64.2 fL — ABNORMAL LOW (ref 78.0–100.0)
RBC: 2.62 MIL/uL — ABNORMAL LOW (ref 3.87–5.11)
WBC: 11.2 10*3/uL — ABNORMAL HIGH (ref 4.0–10.5)

## 2010-04-17 LAB — URINE MICROSCOPIC-ADD ON

## 2010-04-17 LAB — COMPREHENSIVE METABOLIC PANEL
ALT: 22 U/L (ref 0–35)
AST: 19 U/L (ref 0–37)
CO2: 23 mEq/L (ref 19–32)
Chloride: 106 mEq/L (ref 96–112)
GFR calc Af Amer: >60 mL/min (ref >60)
GFR calc non Af Amer: >60 mL/min (ref >60)
Glucose, Bld: 121 mg/dL — ABNORMAL HIGH (ref 70–99)
Sodium: 138 mEq/L (ref 135–145)
Total Bilirubin: 0.7 mg/dL (ref 0.3–1.2)

## 2010-04-17 LAB — IRON AND TIBC
Iron: 12 ug/dL — ABNORMAL LOW (ref 42–135)
UIBC: 328 ug/dL

## 2010-04-17 LAB — RAPID URINE DRUG SCREEN, HOSP PERFORMED
Amphetamines: NOT DETECTED
Barbiturates: NOT DETECTED
Tetrahydrocannabinol: NOT DETECTED

## 2010-04-17 LAB — URINALYSIS, ROUTINE W REFLEX MICROSCOPIC
Bilirubin Urine: NEGATIVE
Specific Gravity, Urine: 1.01 (ref 1.005–1.030)
Urobilinogen, UA: 0.2 mg/dL (ref 0.0–1.0)
pH: 7 (ref 5.0–8.0)

## 2010-04-17 LAB — VITAMIN B12: Vitamin B-12: 642 pg/mL (ref 211–911)

## 2010-04-17 LAB — PREPARE RBC (CROSSMATCH)

## 2010-04-17 LAB — HEMOGLOBIN AND HEMATOCRIT, BLOOD: Hemoglobin: 9.2 g/dL — ABNORMAL LOW (ref 12.0–15.0)

## 2010-04-18 LAB — BASIC METABOLIC PANEL
BUN: 11 mg/dL (ref 6–23)
CO2: 23 mEq/L (ref 19–32)
CO2: 24 mEq/L (ref 19–32)
Calcium: 8.5 mg/dL (ref 8.4–10.5)
Calcium: 8.8 mg/dL (ref 8.4–10.5)
Chloride: 100 mEq/L (ref 96–112)
Creatinine, Ser: 1.14 mg/dL (ref 0.4–1.2)
GFR calc Af Amer: >60 mL/min (ref >60)
GFR calc non Af Amer: 49 mL/min — ABNORMAL LOW (ref >60)
GFR calc non Af Amer: >60 mL/min (ref >60)
Glucose, Bld: 111 mg/dL — ABNORMAL HIGH (ref 70–99)
Glucose, Bld: 93 mg/dL (ref 70–99)
Potassium: 3.5 mEq/L (ref 3.5–5.1)
Sodium: 130 mEq/L — ABNORMAL LOW (ref 135–145)
Sodium: 138 mEq/L (ref 135–145)

## 2010-04-18 LAB — CBC
HCT: 30.1 % — ABNORMAL LOW (ref 36.0–46.0)
Hemoglobin: 8.8 g/dL — ABNORMAL LOW (ref 12.0–15.0)
Hemoglobin: 9.6 g/dL — ABNORMAL LOW (ref 12.0–15.0)
MCHC: 32.2 g/dL (ref 30.0–36.0)
MCV: 75.9 fL — ABNORMAL LOW (ref 78.0–100.0)
RBC: 3.61 MIL/uL — ABNORMAL LOW (ref 3.87–5.11)
RBC: 3.94 MIL/uL (ref 3.87–5.11)
RDW: 29.8 % — ABNORMAL HIGH (ref 11.5–15.5)
WBC: 11.2 10*3/uL — ABNORMAL HIGH (ref 4.0–10.5)

## 2010-05-08 ENCOUNTER — Other Ambulatory Visit (HOSPITAL_COMMUNITY)
Admission: RE | Admit: 2010-05-08 | Discharge: 2010-05-08 | Disposition: A | Discharge status: Home / Self Care | Payer: Self-pay | Source: Ambulatory Visit | Attending: Radiation Oncology | Admitting: Radiation Oncology

## 2010-05-08 ENCOUNTER — Ambulatory Visit: Payer: Self-pay | Attending: Radiation Oncology | Admitting: Radiation Oncology

## 2010-05-08 ENCOUNTER — Other Ambulatory Visit: Payer: Self-pay | Admitting: Radiation Oncology

## 2010-05-08 DIAGNOSIS — C549 Malignant neoplasm of corpus uteri, unspecified: Secondary | ICD-10-CM | POA: Insufficient documentation

## 2010-05-16 NOTE — Group Therapy Note (Signed)
NAME:  [name], MATURA NO.:  [id number]   MEDICAL RECORD NO.:  [id number]          PATIENT TYPE:  WOC   LOCATION:  [facility]                   FACILITY:  [facility]   PHYSICIAN:  Allie Bossier, MD        DATE OF BIRTH:  14-Sep-1955   DATE OF SERVICE:                                  CLINIC NOTE   [name] is a 55 year old married lady who underwent a TAH-BSO and  partial omentectomy 12 weeks ago.  I initially was doing a TAH-BSO for  dysfunctional uterine bleeding and an ovarian cyst.  However, during the  case, it became apparent that she had invasive ovarian cancer.  Her  surgery was uncomplicated.  The pathology came back as a primary ovarian  cancer and a primary uterine cancer.  She has been seeing the GYN  oncologist at [facility], and she is now status post three of her six  rounds of chemotherapy.  Besides the hair loss and a few body aches, she  is doing amazingly well.  She has intended to and has successfully lost  30 pounds, taking her weight from 200 to 170 pounds for her 5 feet 2  inch frame.  She is exercising and actually feels well.   On exam, her cuff is well-healed.  Her vagina has significant atrophy,  and her incision is certainly well-healed.   ASSESSMENT/PLAN:  Postoperatively stable.  She will follow up here as  necessary.  I have told that I would be happy to take care of any exams  the oncologists feel comfortable with having done here instead of [facility].      Allie Bossier, MD     MCD/MEDQ  D:  06/10/2008  T:  06/10/2008  Job:  [id number]

## 2010-05-16 NOTE — Discharge Summary (Signed)
[name], [name] NO.:  [id number]   MEDICAL RECORD NO.:  [id number]          PATIENT TYPE:  INP   LOCATION:  9372                          [facility]   PHYSICIAN:  Tilda Burrow, M.D. DATE OF BIRTH:  11-Jan-1955   DATE OF ADMISSION:  03/25/2008  DATE OF DISCHARGE:  03/29/2008                               DISCHARGE SUMMARY   ADMISSION DIAGNOSES:  1. Partial small bowel obstruction.  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  3. 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   DISCHARGE DIAGNOSES:  1. Postoperative partial small bowel obstruction versus ileus,      resolved.  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  3. 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   PROCEDURES:  Nasogastric suction times two days.   DISCHARGE MEDICATIONS:  1. Clonidine 0.1 mg p.o. b.i.d.  2. Lisinopril 20 mg one p.o. daily.  3. Zantac 150 mg p.o. b.i.d.  4. Tandem Plus one tablet p.o. daily.  5. Megestrol AC 20 mg p.o. as directed.   HOSPITAL SUMMARY/HISTORY OF PRESENT ILLNESS:  This 55 year old female,  gravida 3, para 3 status post total abdominal hysterectomy and bilateral  salpingo oophorectomy and omentectomy by Dr. Nicholaus Bloom on March 18, 2008, for menorrhagia and a complex cystic structure, felt to be a  hydrosalpinx, was found at the time of surgery to have high grade  ovarian as well as high grade endometrial cancers as described in that  HPI.  After discharged from the hospital in stable condition tolerating  p.o., she was readmitted on March 25, 2008, after presenting for CT of  the abdomen, pelvis, and chest as part of the metastatic workup and  being found to have two days of symptomatic nausea and vomiting with x-  rays suggesting partial small bowel obstruction.   PAST MEDICAL HISTORY:  Hypertension.   PAST SURGICAL HISTORY:  1. Tubal ligation.  2. Umbilical hernia repair.  3.  TAH BSO.  4. Omentectomy on March 18, 2008, through a Pfannenstiel incision.   FAMILY HISTORY:  Negative for breast tumor and colon malignancies.   SOCIAL HISTORY:  Negative for tobacco, alcohol and drug use.   PHYSICAL EXAMINATION:  ABDOMEN:  No rebound or guarding, but had absent  bowel sounds.  X-rays suggested ileus versus small bowel obstruction.  The patient was placed on n.p.o. with IV fluids, nasogastric tube and IV  Protonix with conversion of antihypertensive to Labetalol IV and  Clonidine patch.   LABORATORY DATA:  Potassium 3.0, BUN 8, creatinine 0.80, EGFR greater  than 60 with hemoglobin 9.2, hematocrit 28.0, platelets 733,000.  White  count 9300, albumin low at 3.1.  The patient remained afebrile during  the hospital course.  On March 26, 2007, the patient had nasogastric  suction obtaining 50 cc of bilious fluid per hour initially with gradual  progression and improvement.  This diminished significantly by March 26, 2008.  First days efforts involved significant manipulation of  antihypertensives in order to control her blood  pressures which ranged  from 170-190/80-95.  She was given clonidine patch, initially attempted  on labetalol 20 mg IV doses as needed alternating with hydralazine 10 mg  IV doses.  This was supplemented by placing her on an ace inhibitor  intravenously.  Surgical consult was obtained as well.  The patient was  placed on Enalapril (Vasotec) 1.5 mg IV q.6 h increasing at 2.5 mg IV  q.6 h.  When pressures persisted and elevated into the 170s systolic  with wide pulse pressure, Labetalol drip was initiated, and it became  necessary to reduce the labetalol drip due to overcorrection of blood  pressures.  On March 28, 2008, the patient was tolerating p.o.  adequately.  Nasogastric tube removed, and she was restarted on her oral  medications with Vasotec and intravenous dosing discontinued.  The  patient was placed on lisinopril 20 mg p.o. daily as  well as resumption  of clonidine 0.1 mg p.o. b.i.d.   Hemoglobin diminished to 8.4 and 25.9 and so the patient was transfused  two units of paced cells on March 28, 2008, and post transfusion  hemoglobin returned 10.4 and hematocrit 31.4.  The patient was observed  overnight and is stable for discharge this a.m.  She has follow up  appointment at 10 a.m. today with Dr. Cleda Mccreedy at [facility]  GYN[facility]On[facility], and will be discharged this a.m. for follow up  consultation and planning of subsequent Oncology management.      Tilda Burrow, M.D.  Electronically Signed     JVF/MEDQ  D:  03/29/2008  T:  03/29/2008  Job:  [id number]   cc:   De Blanch, M.D.  [address]  [place]  [place] [address]

## 2010-05-16 NOTE — Discharge Summary (Signed)
[name], [name]             ACCOUNT NO.:  [id number]   MEDICAL RECORD NO.:  [id number]          PATIENT TYPE:  INP   LOCATION:  9304                          [facility]   PHYSICIAN:  Lesly Dukes, M.D. DATE OF BIRTH:  [birth year]   DATE OF ADMISSION:  03/18/2008  DATE OF DISCHARGE:  03/20/2008                               DISCHARGE SUMMARY   The patient is a 55 year old female who was operated on by Dr. Marice Potter on  March 18, 2008, for dysfunctional uterine bleeding that had required  blood transfusion in the past.  During the operation, the ovaries looked  worrisome for cancer and a frozen section showed borderline ovarian  cancer/tumor of low malignant potential.  She also had her washings come  back positive later for adenocarcinoma.  The patient had a TAH BSO and  omentectomy.  There was no other staging done due to lack of  GYN/oncologist in this hospital.  Postoperatively, the patient did well.  She has slight oozing of small amount of blood from her incision on  March 19, 2008.  She was kept an extra day, her incision is still  intact.  She remained afebrile, voiding, passing flatus, and ambulating.  The patient also tolerated orals well.  Her discharge hemoglobin is 7.4,  this is stable from a hemoglobin of 7.3 done earlier on the day of  discharge.   CONDITION AT DISCHARGE:  Stable.   MEDICATIONS:  1. Clonidine 0.1 mg p.o. daily.  2. Vicodin 1-2 tablets p.o. q.4-6 h. p.r.n., prescription given.   FOLLOWUP APPOINTMENT:  In 1 week for incision check and then in 5 weeks  for postoperative check.   DISCHARGE INSTRUCTIONS:  1. No heavy lifting for 6 weeks.  2. Vaginal rest for 6 weeks.  3. No driving while on Vicodin.  4. Follow up with Dr. De Blanch at the  [facility]      for cancer treatment.  5. Return to ER for severe abdominal pain, intractable nausea or      vomiting, opening of incision.      Lesly Dukes, M.D.  Electronically  Signed     KHL/MEDQ  D:  03/20/2008  T:  03/21/2008  Job:  [id number]

## 2010-05-16 NOTE — Discharge Summary (Signed)
[name], [name]             ACCOUNT NO.:  [id number]   MEDICAL RECORD NO.:  [id number]          PATIENT TYPE:  INP   LOCATION:  9304                          [facility]   PHYSICIAN:  Allie Bossier, MD        DATE OF BIRTH:  01-29-1955   DATE OF ADMISSION:  03/03/2008  DATE OF DISCHARGE:  03/05/2008                               DISCHARGE SUMMARY   DISCHARGE DIAGNOSES:  1. Dysfunctional uterine bleeding.  2. Anemia.  3. Mixed fibroid uterus.  4. Bilateral ovarian cysts.   REASON FOR ADMISSION:  [name] is a 56 year old female who  presented to the MAU with a history of worsening vaginal bleeding and  abdominal cramping.  She notes that her vaginal bleeding has became much  heavier over the last few days, soaking according to the patient up to  several pads per hour.  She had been passing blood and clots vaginally.  She was recently told she had fibroids in her uterus.  On evaluation in  the MAU, she was found to have a hemoglobin of 4.2 and she was admitted  for transfusion and further evaluation.   HOSPITAL COURSE:  The patient was admitted and received a transfusion  for total of 6 units of packed red blood cells.  A transabdominal  ultrasound of her pelvis revealed a 9 x 5 x 9 uterus with a 6 x 6 x 5  fibroid, a 4 x 3 x 3 fibroid, and a 3 x 2 x 2 fibroids.  Her hemoglobin  after the transfusions was 11.5, hematocrit 34.7, platelets 355.  The  patient also received a shot of Depo-Provera by the time of discharge.  She complains of still having moderate bleeding.   MEDICATIONS AT DISCHARGE:  The patient will continue her clonidine for  her hypertension.  The patient was given a prescription for Megace to  take 40 mg twice daily for 5 days followed by 20 mg twice daily for 5  days, then 20 mg daily.   The patient was instructed on use for medications.  Additionally, she  was instructed that she has a surgical date and was given instructions  on when and  where to show up for that.  Instructed to call our clinic if  she has any concerns or problems in the interim or to return to the  [facility] to the Admissions Unit for bleeding that is worsening.  The patient voiced understanding of these instructions and her questions  are answered.   CONDITION AT DISCHARGE:  Good.     Odie Sera, DO  Electronically Signed     ______________________________  Allie Bossier, MD   MC/MEDQ  D:  03/05/2008  T:  03/06/2008  Job:  [id number]

## 2010-05-16 NOTE — Discharge Summary (Signed)
[name], [name]             ACCOUNT NO.:  [id number]   MEDICAL RECORD NO.:  [id number]          PATIENT TYPE:  INP   LOCATION:  1444                         FACILITY:  [facility]   PHYSICIAN:  Hillery Aldo, M.D.   DATE OF BIRTH:  [birth year]   DATE OF ADMISSION:  01/31/2008  DATE OF DISCHARGE:  02/04/2008                               DISCHARGE SUMMARY   PRIMARY CARE PHYSICIAN:  Dr. Della Goo.   GYNECOLOGIST:  Dr. Estanislado Pandy.   DISCHARGE DIAGNOSES:  1. Chronic blood loss anemia.  2. Dysfunctional uterine bleeding.  3. Bronchitis.  4. Acute renal insufficiency.  5. Hyponatremia.  6. Hypokalemia.  7. Hypertension.  8. Leukocytosis.  9. Uterine fibroid disease.  10.Bilateral complex cysts of the ovary.  Follow-up recommended.   DISCHARGE MEDICATIONS:  1. Albuterol HFA 2 puffs q.4-6 hours p.r.n. wheezing or shortness of      breath.  2. Clonidine 0.1 mg b.i.d.  3. Femcon 1 tablet t.i.d. through January 05, 2008, then decrease to      b.i.d. x3 days, then daily.  4. Phenergan 25 mg q.6 hours p.r.n. nausea.  5. Vicodin 5/500 one to two tabs q.6 hours p.r.n. pain.  6. Iron sulfate 325 mg t.i.d.   CONSULTATIONS:  None in-house.  Telephone consultation made with Dr.  Estanislado Pandy.   PROCEDURES AND DIAGNOSTIC STUDIES:  Pelvic/transvaginal ultrasound on  February 02, 2008 showed uterine fibroid disease with dominant 5.4-cm  fundal fibroid.  A 5-cm complex cystic abnormality of the left  ovary/adnexa requiring follow-up ultrasound.  Cystic neoplasm cannot be  completely excluded.  Follow-up recommended in 6-8 weeks.  A 4-cm  complex cyst of the right ovary.  Follow-up also recommended.  Endometrium was homogenous and measured approximately 4 mm in thickness.   DISCHARGE LABORATORY VALUES:  Sodium was 130, potassium 4.0, chloride  100, bicarb 23, BUN 13, creatinine 1.14, glucose 93.  White blood cell  count was 16.5, hemoglobin 8.8, hematocrit 27.4, platelets 501.   HOSPITAL  COURSE BY PROBLEM:  1. Chronic blood loss anemia:  The patient's blood loss is due to      severe menorrhagia and dysfunctional uterine bleeding.  Her      hemoglobin on presentation was 5.0.  The patient received 4 units      of packed red blood cells and her hemoglobin has remained stable,      though trending down over time.  At this point, the bleeding has      stopped with hormonal treatment.  The patient does have a follow-up      GYN appointment with Dr. Estanislado Pandy this Friday where she will need an      endometrial biopsy and further treatment.  The patient was also      started on iron supplementation therapy.  2. Dysfunctional uterine bleeding/uterine fibroids:  The patient was      admitted, and her anemia was addressed by transfusion.  Telephone      consultation was made with Dr. Estanislado Pandy who recommended Premarin 25      mg IV q.6 hours until vaginal bleeding stops.  She also  recommended      initiation of Femcon 1 tablet t.i.d. x3 days, then b.i.d. x3 days,      then daily.  The patient did not have any history of blood clotting      disorder.  There was no family history of blood clots.  The patient      is a nonsmoker, and this therapy was felt to be acceptable.  By      February 04, 2008 the patient reported the vaginal bleeding had all      but stopped.  Premarin is being discontinued, and she will continue      with Femcon as outlined above.  3. Bronchitis.  The patient originally presented to the hospital with      complaints of weakness, cough, and wheezing.  She was treated with      azithromycin for bronchitis.  The patient is no longer complaining      of significant cough or wheeze.  The azithromycin will be      discontinued on discharge, and she can continue to take albuterol      HFA 2 puffs q.4 hours p.r.n.  4. Renal insufficiency:  The patient was noted to be renally      insufficient when started on hydrochlorothiazide therapy.  Her      initial creatinine was  0.74 and after initiating      hydrochlorothiazide, it went up to 1.16.  The hydrochlorothiazide      has now been discontinued, and her blood pressure has been managed      with an alternative regimen.  5. Hyponatremia:  The patient was not hyponatremic on admission.  This      was also felt to be due to a side effect of hydrochlorothiazide      which has been discontinued.  Her sodium should trend back up into      the normal range.  6. Hypokalemia:  Again, likely due to therapy with      hydrochlorothiazide.  She was appropriately repleted, and the      hydrochlorothiazide was discontinued.  7. Hypertension:  The patient was initially put on hydrochlorothiazide      and clonidine.  Clonidine appears to be controlling her blood      pressure adequately.  The hydrochlorothiazide has been discontinued      due to the above-noted problems.  8. Leukocytosis:  The patient's white blood cell count has risen over      time.  This may be a stress response to her significant acute on      chronic blood loss anemia.  She should follow up with Dr. Lovell Sheehan,      and have a repeat of her CBC done in 1-2 weeks to ensure that it is      trending down.  9. Complex bilateral cysts on the ovaries:  The patient will follow up      with Dr. Estanislado Pandy where she can have a further GYN evaluation done.   DISPOSITION:  The patient is medically stable, and will be discharged  home.  She has follow-up scheduled with Dr. Estanislado Pandy on February 06, 2008  at 1 p.m.Marland Kitchen  She is to call Dr. Lovell Sheehan and arrange for follow-up with  her in 1-2 weeks.      Hillery Aldo, M.D.  Electronically Signed     CR/MEDQ  D:  02/04/2008  T:  02/04/2008  Job:  [id number]   cc:   Della Goo,  M.D.  Fax: [phone]   Dois Davenport A. Rivard, M.D.  Fax: [phone]

## 2010-05-16 NOTE — Op Note (Signed)
[name], [name]             ACCOUNT NO.:  [id number]   MEDICAL RECORD NO.:  [id number]          PATIENT TYPE:  INP   LOCATION:  9304                          [facility]   PHYSICIAN:  Allie Bossier, MD        DATE OF BIRTH:  04/11/1955   DATE OF PROCEDURE:  DATE OF DISCHARGE:                               OPERATIVE REPORT   PREOPERATIVE DIAGNOSES:  1. Fibroids.  2. Severe anemia requiring transfusion of 6 units of packed cells.   POSTOPERATIVE DIAGNOSES:  1. Fibroids.  2. Severe anemia requiring transfusion of 6 units of packed cells.  3. Ovarian cancer.   PROCEDURES:  1. Total abdominal hysterectomy.  2. Pelvic washings.  3. Exploratory laparotomy.  4. Bilateral salpingo-oophorectomy.  5. Partial omentectomy   SURGEON:  Myra C. Marice Potter, MD   ASSISTANT:  Marden Noble, MD.   ANESTHESIA:  Dorena Bodo Tyrone Apple Malen Gauze, MD   COMPLICATIONS:  None.   ESTIMATED BLOOD LOSS:  500 mL.   SPECIMENS:  Uterus, tubes, ovaries, and omentum.   Frozen section done on the 10-cm right ovarian mass showed borderline  cancer.  The left ovary appeared to have frankly gross carcinoma.  The  oviducts appeared normal.  The uterus had an approximately 6-cm fundal  fibroid.  The omentum appeared to be free of disease.   DETAILED PROCEDURE AND FINDINGS:  Preoperatively, the risks, benefits,  and alternatives of the surgery were explained, understood and accepted.  I discussed common risks of surgery including but not limited to  infection, bleeding, damage to bowel, bladder, and ureters.  Her SCDs  were placed and IV Ancef was given.  She was taken to the operating room  and general anesthesia was applied without complication.  Her abdomen  and vagina were prepped and draped in usual sterile fashion.  A Foley  catheter was placed, which drained clear urine throughout the case.  A  transverse incision was made approximately 2 cm above the symphysis  pubis.  Incision was carried down through  the subcutaneous tissue to the  fascia.  Fascia was scored in the midline.  Fascial incision was  extended bilaterally and curved slightly upwards.  The middle 50% of the  rectus muscles were separated in transverse fashion using  electrosurgical technique.  The inferior epigastric vessels were left  intact.  Preoperatively, we were able to feel what we believed to be the  uterus as a 14-week size uterus.  Upon entering the peritoneum, there  was a moderate amount of ascitic fluid and a large right ovarian mass  was noted.  It appeared to have some features that would be suspicious  for ovarian cancer and I did pelvic washings.  I identified the ureter  on her right and the infundibulopelvic ligament on her right and the  utero-ovarian ligament, removing the right adnexal mass was sent for  frozen section, which came back as listed above.  At this point, I then  packed the bowel out of the abdomen placed an O'Connor-O'Sullivan self-  retaining retractor and visualized the pelvis.  The left ovary had gross  papillations and fronds growing from it.  It was adherent to the uterus  and it was very suspicious for cancer.  The ureter on the left was  identified and the IP ligament on the left was clamped, cut, and doubly  ligated.  Please note that 2-0 Vicryl sutures used throughout this case  unless otherwise specified.  The round ligaments were then clamped, cut,  and ligated.  A bladder flap was created anteriorly.  The uterine  vessels were identified, skeletonized, clamped, cut, and doubly ligated.  The relatively long cervix was then separated from its pelvic  attachments using clamp, cut, and ligate technique.  The cervix was  removed intact and sent to Pathology.  The remainder of the vaginal cuff  was closed with a 0 Vicryl running-locking suture.  Excellent hemostasis  was noted.  The pelvis was irrigated with 2 liters of warm sterile  water.  All the pedicles were inspected and noted  to be hemostatic.  A  perineal closure over the pedicles and the cuff was done with a 2-0  Vicryl running nonlocking suture.  At this point, the pelvis was clean  and I removed the packing and the self-retaining retractor.  By  maximizing her muscle relaxants, I was able to adequately visualize the  omentum up to the point of the transverse colon.  I initially  contemplated making a J incision for a vertical incision at this point;  however, I felt that I could get adequate visualization of her omentum  and safely remove it.  With excellent retraction, I was able to  painstakingly and carefully remove the omentum with using Kelly clamps  and 2-0 Vicryl sutures.  I tagged the first and last sutures on the  omentum and inspected it at the end.  There was no bleeding from any of  the sites.  I then rechecked the pelvis.  It was all hemostatic.  I  closed the fascia with a 0 Vicryl running nonlocking suture.  Subcutaneous tissue was irrigated, cleaned, and dried.  It was then  infiltrated with 30 mL of 0.5% Marcaine.  A subcuticular closure was  done with 3-0 Vicryl suture.  Steri-Strips placed across her incision.  Her Foley catheter drained clear urine throughout.  Please note that at  the end of the case I visualized the functioning ureters.  The  instruments, sponge, and needle counts were correct.  She was taken to  recovery room in stable condition.      Allie Bossier, MD  Electronically Signed     MCD/MEDQ  D:  03/18/2008  T:  03/19/2008  Job:  [id number]

## 2010-05-16 NOTE — Discharge Summary (Signed)
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  Pending final pathology.   CONDITION:  Stable.   DISPOSITION:  Home.   DIET:  As tolerated.   FOLLOWUP:  She is to follow up with the GYN oncologist in [place] on  March 29, 2008.  Contact number and information has been given to Ms.  Balentine.  She will follow up with me in 6 weeks or sooner as necessary.   MEDICATIONS:  Newly prescribed medication is Vicodin one p.o. q.4 h  p.r.n. pain #30, no refills.   BRIEF HISTORY OF PRESENT ILLNESS AND HOSPITAL COURSE:  [name] is a  menstruating 55 year old married, black gravida 3, para 3 who was first  seen by me on March 03, 2008, in the MAU when she came in with  dysfunctional uterine bleeding and hemoglobin of 4.2.  She was admitted  overnight and given 6 units of packed red blood cells and scheduled for  surgery.  Please note her ultrasound at that time showed a 6.5 cm fundal  fibroid.  The left ovary was not visualized and there was a right  adnexal complex seen.  It was mentioned that it might be a hydrosalpinx.  Please note during her admission on March 03, 2008, she was given a Depo-  Provera shot.  When she came in for her surgery on the 18th, her  bleeding was much better and her  hemoglobin was actually up to 10.7.  She underwent exploratory laparotomy and TAH-BSO.  Her abdomen was  opened for her hysterectomy on March 18, 2008 under general anesthesia.  Upon opening the abdomen, the right ovary was clearly not normal.  I  obtained pelvic washing and the removed the right ovary without cyst  spillage and had it sent for frozen section.  The frozen section showed  at least borderline tumor.  Upon inspection, the left ovary was much  smaller than the right ovary, but it did appear to have features  consistent with invasive ovarian cancer.  At this time a CA-125 was  drawn and sent.  This result has subsequently come back as 173.  There  was a small amount of ascitic fluid in her pelvis upon opening the  abdomen.  The omentum appeared free from disease as did the remainder of  her abdominopelvic exam.  After completing the TAH-BSO portion of the  case, I did a partial omentectomy without complication.  Her hemoglobin  preop was 10.7.  On postoperative day #1, it was 8.4 and by  postoperative day #2, it was 7.3, but repeat on that was stable at 7.4.  She remained afebrile throughout the case and had normal vital signs.  She was ambulating, tolerating p.o., and voiding without dysuria by the  day of discharge.  Her abdominal exam  revealed a benign abdomen with normoactive bowel sounds.  There was no  distention and nontender.  The final pathology is not available at the  time of this dictation.   I did speak with Ms. Galster yesterday and she is doing well  postoperatively.      Allie Bossier, MD  Electronically Signed     MCD/MEDQ  D:  03/24/2008  T:  03/25/2008  Job:  [id number]

## 2010-05-16 NOTE — H&P (Signed)
NAMEMarland Kitchen  TONA, QUALLEY NO.:  [id number]   MEDICAL RECORD NO.:  [id number]          PATIENT TYPE:  INP   LOCATION:  9318                          [facility]ACILITY:  [facility]   PHYSICIAN:  Lesly Dukes, M.D. DATE OF BIRTH:  1955/03/17   DATE OF ADMISSION:  03/25/2008  DATE OF DISCHARGE:                              HISTORY & PHYSICAL   The patient is a 55 year old para 3 female who was operated on by Dr.  Nicholaus Bloom on March 18, 2008, for menorrhagia and a complex cystic  structure though it was felt to be hydrosalpinx and that the patient has  high-grade ovarian/endometrial cancer.  So, she was discharged from the  hospital on the 20th, afebrile, tolerating p.o., ambulating, and passing  flatus.  We got her appointment with Dr. Stanford Breed in [facility]  and on a preappointment CT of the abdomen, pelvis, and chest, it was  found that the patient has a small bowel obstruction.  There is no dye  extravasating.  The patient was symptomatic with nausea and vomiting.  She told this to the radiologist, she did not call our office.  She has  been having this nausea and vomiting for 2 days.  The patient denies  fevers or severe abdominal pain, chest pain, shortness of breath.  The  patient was called to come in for admission and treatment for small  bowel obstruction.   PAST MEDICAL HISTORY:  Hypertension.   PAST SURGICAL HISTORY:  Tubal ligation, umbilical hernia repair, and  TAH/BSO and omentectomy which turned out to be a GYN malignancy.   FAMILY HISTORY:  Negative for breast, GYN, and colon malignancies.   SOCIAL HISTORY:  Negative for tobacco, alcohol, or drug use.   ALLERGIES:  No known drug allergies.   MEDICATIONS:  1. Iron.  2. Clonidine 0.1 mg p.o. b.i.d.  3. Percocet.  4. Docusate sodium.   REVIEW OF SYSTEMS:  Nausea and vomiting as described above.  No  headache, chest pain, shortness of breath, vaginal bleeding, extremity  pain.   PHYSICAL EXAMINATION:   VITAL SIGNS:  Afebrile.  Blood pressure 163/100,  it was likely due to not being able to take clonidine.  GENERAL:  A well-nourished, well-developed in no apparent distress.  HEENT:  Normocephalic and atraumatic.  HEART:  Tachycardic.  Regular rhythm.  LUNGS:  Clear to auscultation bilaterally.  ABDOMEN:  Slightly distended, no rebound, no guarding.  No bowel sounds.  Incision has Steri-Strips about it and still looks intact.  EXTREMITIES:  Nontender, negative Homans'.   ASSESSMENT AND PLAN:  A 55 year old female status post total abdominal  hysterectomy and bilateral salpingo-oophorectomy on March 18, 2008 with  small bowel obstruction.   1. IV fluids, n.p.o., NG-tube, Protonix.  2. We will treat blood pressure p.r.n. labetalol IV, we will try to      pass oral medications in a day.  3. General Surgery consult in a.m.  4. CBC and CMP.      Lesly Dukes, M.D.  Electronically Signed     KHL/MEDQ  D:  03/25/2008  T:  03/26/2008  Job:  [id number] 

## 2010-05-16 NOTE — H&P (Signed)
[name], [name]             ACCOUNT NO.:  [id number]   MEDICAL RECORD NO.:  [id number]          PATIENT TYPE:  INP   LOCATION:  1444                         FACILITY:  [facility]   PHYSICIAN:  Della Goo, M.D. DATE OF BIRTH:  09-Jul-1955   DATE OF ADMISSION:  01/31/2008  DATE OF DISCHARGE:                              HISTORY & PHYSICAL   PRIMARY CARE PHYSICIAN:  None.   CHIEF COMPLAINT:  Weakness, cough and wheezing.   HISTORY OF PRESENT ILLNESS:  This is a 55 year old female who presents  to the emergency department with complaints of worsening weakness over  the past 2 weeks.  The patient reports having fatigue and shortness of  breath as well.  She also reports being lightheaded.  She also states  that she began to have an upper respiratory infection, Chest congestion,  wheezing, cough for the past 6 days.  She reports having chills, but  denies having any fevers that she knows of.  She does report having  transient nausea, vomiting and mild loose stools.  She denies having any  chest pain.  She denies having any hematemesis, hematochezia or melena  passage.   The patient reports having heavy menses and dysfunctional uterine  bleeding which she was diagnosed with a few years ago.  She states that  she was also told that she has uterine fibroids.  Her last menstrual  period started 2 weeks ago, and she also states still continuing to have  bleeding at this time.   PAST MEDICAL HISTORY:  Self-reported history of uterine fibroids and  also history of menorrhagia.   PAST SURGICAL HISTORY:  None.   MEDICATIONS:  None.   ALLERGIES:  NO KNOWN DRUG ALLERGIES.   SOCIAL HISTORY:  The patient is married.  She is a nonsmoker,  nondrinker.   FAMILY HISTORY:  Positive for hypertension and bone cancer in her  maternal grandmother.  No history of coronary artery disease or diabetes  mellitus in her family that she knows of.   REVIEW OF SYSTEMS:  Pertinents are mentioned  above.  All other systems  are negative.   PHYSICAL EXAMINATION FINDINGS:  GENERAL:  This is a 55 year old obese  female in mild discomfort but no acute distress.  VITAL SIGNS:  Temperature 99.0, blood pressure 178/86, heart rate 93,  respirations 20, O2 saturations 96-100%.  HEENT:  Normocephalic, atraumatic.  There is no scleral icterus.  There  is mild conjunctival pallor.  Pupils are equally round, reactive to  light.  Extraocular movements are intact.  Funduscopic benign.  Oropharynx is clear.  NECK:  Supple, full range of motion.  No thyromegaly, adenopathy or  jugular venous distention.  CARDIOVASCULAR:  Regular rate and rhythm.  No murmurs, gallops or rubs.  LUNGS:  Clear to auscultation bilaterally.  ABDOMEN:  Positive bowel sounds, soft, nontender, nondistended.  EXTREMITIES:  Without cyanosis, clubbing or edema.  NEUROLOGIC:  The patient is alert and oriented x3.  There are no focal  deficits.   LABORATORY STUDIES:  White blood cell count 11.2, hemoglobin 5.0,  hematocrit 16.8, MCV 64.2, platelets 477, neutrophils 79%, lymphocytes  12%.  Chemistry with a sodium of 138, potassium 3.2, chloride 106,  carbon dioxide 23, BUN 6, creatinine 0.74, calcium 8.6, albumin 3.0, AST  19, ALT 22, alcohol level less than 5, protime 14.0, INR 1.1.  PTT 22.  Urinalysis reveals moderate urine hemoglobin, leukocyte esterase trace,  urine nitrites are negative.  Please note the patient reports that she  does have current vaginal bleeding.  Which is mild.   LABORATORY STUDIES:  Urine HCG is negative.   ASSESSMENT:  A 55 year old female being admitted with:  1. Severe microcytic anemia which appears to be chronic.  2. Menorrhagia.  3. Dysfunctional uterine bleeding.  4. Upper respiratory infection/acute bronchitis.  5. Mild hypokalemia.  6. Elevated blood pressure.   PLAN:  The patient will be admitted and transfused a total of 4 units of  packed red blood cells, one unit will be placed  on hold.  An anemia  panel will also be ordered.  A transvaginal ultrasound study will also  be ordered.  The patient will be placed in SCDs for DVT prophylaxis and  GI prophylaxis has also been ordered and her potassium will be repleted.  Further workup regarding her dysfunctional uterine bleeding will be  arranged while the patient is hospitalized whether this is to be done as  an inpatient or arranged as an outpatient.      Della Goo, M.D.  Electronically Signed     HJ/MEDQ  D:  02/01/2008  T:  02/01/2008  Job:  [id number]

## 2010-05-16 NOTE — H&P (Signed)
[name], LAMISON NO.:  [id number]   MEDICAL RECORD NO.:  [id number]          PATIENT TYPE:  INP   LOCATION:  9304                          FACILITY:  [facility]   PHYSICIAN:  Allie Bossier, MD        DATE OF BIRTH:  1955-02-07   DATE OF ADMISSION:  03/18/2008  DATE OF DISCHARGE:                              HISTORY & PHYSICAL   Ms. Mentel is a 55 year old married white gravida 3, para 3.  I met her  first in the MAU on March 03, 2008 when she came in with dysfunctional  uterine bleeding and a hemoglobin of 4.2.  She says that she was soaking  several pads up to the hour.  She has actually been having cyclic  bleeding, but never this heavy.  An ultrasound was done during that MAU  visit that showed her uterus measuring 9.4 x 9.6 cm with a 6.5 cm fundal  fibroid.  There were few other fibroids as well.  Endometrial lining  measured 9.9 mm with fluid in the endometrial cavity.  The right ovary  was read as measuring 3.6 x 2.2 cm and the left ovary was not  visualized.  On an ultrasound done last month, the left ovary was  visualized, also dictated was adjacent to the right ovary appearing to  be separate from the right ovary is a complex cystic structure which may  represent a hydrosalpinx.  She was admitted to the hospital that day and  given 6 units of packed red blood cells along with a shot of Depo-  Provera to help with her bleeding.  She was also discharged home with  Megace and she was scheduled for surgery and given that date and was  instructed to follow up.   PAST MEDICAL HISTORY:  Obesity and hypertension.   PAST SURGICAL HISTORY:  Tubal ligation and umbilical hernia repair.   FAMILY HISTORY:  Negative for breast, GYN, and colon malignancy.   SOCIAL HISTORY:  Negative for tobacco, alcohol, or drug use.   ALLERGIES:  No known drug allergies.   MEDICATIONS:  1. Iron daily.  She did not have her iron pills for the last 3 days      due to constipation.  2. Megace daily.  3. Clonidine 1.1 mg b.i.d.   REVIEW OF SYSTEMS:  She is not currently sexually active due to the  vaginal bleeding, but prior to that had no dyspareunia.   PHYSICAL EXAMINATION:  VITAL SIGNS:  Stable.  Afebrile.  GENERAL:  A very pleasant female in no apparent distress.  HEENT:  Normal.  HEART:  Regular rate and rhythm.  LUNGS:  Clear to auscultation bilaterally.  ABDOMEN:  No hepatosplenomegaly palpable.  The uterus appeared to be  about 14 weeks' size uterus.  BIMANUAL:  Uterus is palpable to 14 week's size, I was unable to feel  either adnexa separately.   ASSESSMENT AND PLAN:  Dysfunctional uterine bleeding resulting in severe  anemia requiring 6 units of packed red blood cells with a 6.5-cm fibroid  seen on ultrasound, have gotten her scheduled for a TAH  and BSO.  I have  also  recommended that due to her age and the finding on ultrasound that we  removed her ovaries and tubes as well.  She has no objection to this.  I  have discussed the risks of surgery including but not limited to  infection, bleeding, damage to bowel, bladder, and ureters.       Allie Bossier, MD  Electronically Signed     MCD/MEDQ  D:  03/18/2008  T:  03/19/2008  Job:  [id number]

## 2010-05-16 NOTE — Consult Note (Signed)
NAMERASHEA, HOSKIE NO.:  [id number]   MEDICAL RECORD NO.:  [id number]          PATIENT TYPE:  INP   LOCATION:  9318                          FACILITY:  [facility]   PHYSICIAN:  Ollen Gross. Vernell Morgans, M.D. DATE OF BIRTH:  1955-11-05   DATE OF CONSULTATION:  03/26/2008  DATE OF DISCHARGE:                                 CONSULTATION   NOTATION:  This is actually at [facility].   REASON FOR CONSULTATION:  Ms. Escalona is a 55 year old black female who  is now eight days out from a TAH/BSO and omentectomy for ovarian cancer.  She initially did well and went home a couple of days after surgery.  At  that point in time, she was tolerating her diet, passing gas and things  seemed to be going well.  Then three days ago, she started throwing up  again.  She came back to the hospital.  She had a follow-up CT scan done  for ovarian cancer, and it was suggestive of a small bowel obstruction.  She was admitted last night and had an NG tube placed, as they began  correcting her electrolytes.  This morning she did feel as though she  passed a little gas.  She is not really having any complaints, other  than just some nausea.   PAST MEDICAL HISTORY:  1. Significant for ovarian cancer.  2. Obesity.  3. Hypertension.   PAST SURGICAL HISTORY:  1. Significant for a tubal ligation.  2. Umbilical hernia repair.  3. TAH/BSO.  4. Omentectomy.   MEDICATIONS:  1. Iron.  2. Megace.  3. Clonidine.   ALLERGIES:  No known drug allergies.   SOCIAL HISTORY:  She denies the use of alcohol or tobacco products.   FAMILY HISTORY:  Noncontributory.   PHYSICAL EXAMINATION:  VITAL SIGNS:  Temperature 98.4 degrees, pulse 80,  blood pressure 180/90.  GENERAL:  She is a well-developed and well-nourished black female, in no  acute distress.  SKIN:  Warm and dry, no jaundice.  HEENT:  Eyes:  Extraocular movements intact.  Pupils equal, round, react  to light.  Sclerae anicteric.  LUNGS:   Clear bilaterally with no use of accessory respiratory muscles.  HEART:  Regular rate and rhythm with no impulse in the left chest.  ABDOMEN:  Soft, mild distention.  Nontender and quiet.  She has a well-  healed low transverse incision.  EXTREMITIES:  No clubbing, cyanosis or edema.  NEUROLOGIC:  Good strength in her arms and legs.  PSYCHOLOGIC:  She is alert and oriented x3 with no anxiety or  depression.   LABORATORY DATA:  White count is normal.  Potassium is 3.3.   A CT scan was suggestive of a partial obstruction.   ASSESSMENT/PLAN:  This is a 55 year old black female, eight days out  from a previous abdominal surgery, with what looks like a partial small  bowel obstruction:  I think at this point the most likely cause is  probably adhesions or ileus.  It seems less likely that it could be  caused by the previous abdominal closure, given that she actually  did  well and opened up before going home and was passing lots of gas and  able to tolerate her diet.  I would agree at this point with bowel rest  and NG suctioning and correction of her electrolytes.  Will repeat some  abdominal x-rays in the morning and continue to follow her closely with  you.      Ollen Gross. Vernell Morgans, M.D.  Electronically Signed     PST/MEDQ  D:  03/26/2008  T:  03/26/2008  Job:  [id number]

## 2010-05-23 ENCOUNTER — Encounter (HOSPITAL_BASED_OUTPATIENT_CLINIC_OR_DEPARTMENT_OTHER): Payer: Self-pay | Admitting: Oncology

## 2010-05-23 DIAGNOSIS — Z452 Encounter for adjustment and management of vascular access device: Secondary | ICD-10-CM

## 2010-05-23 DIAGNOSIS — C569 Malignant neoplasm of unspecified ovary: Secondary | ICD-10-CM

## 2010-05-23 DIAGNOSIS — C549 Malignant neoplasm of corpus uteri, unspecified: Secondary | ICD-10-CM

## 2010-06-02 HISTORY — PX: OTHER SURGICAL HISTORY: SHX169

## 2010-06-07 ENCOUNTER — Other Ambulatory Visit (HOSPITAL_COMMUNITY): Payer: Self-pay | Admitting: General Surgery

## 2010-06-07 ENCOUNTER — Encounter (HOSPITAL_COMMUNITY)
Admission: RE | Admit: 2010-06-07 | Discharge: 2010-06-07 | Disposition: A | Discharge status: Home / Self Care | Payer: Self-pay | Source: Ambulatory Visit | Attending: General Surgery | Admitting: General Surgery

## 2010-06-07 ENCOUNTER — Ambulatory Visit (HOSPITAL_COMMUNITY)
Admission: RE | Admit: 2010-06-07 | Discharge: 2010-06-07 | Disposition: A | Discharge status: Home / Self Care | Payer: Self-pay | Source: Ambulatory Visit | Attending: General Surgery | Admitting: General Surgery

## 2010-06-07 DIAGNOSIS — I517 Cardiomegaly: Secondary | ICD-10-CM | POA: Insufficient documentation

## 2010-06-07 DIAGNOSIS — C569 Malignant neoplasm of unspecified ovary: Secondary | ICD-10-CM

## 2010-06-07 DIAGNOSIS — Z01812 Encounter for preprocedural laboratory examination: Secondary | ICD-10-CM | POA: Insufficient documentation

## 2010-06-07 DIAGNOSIS — Z01810 Encounter for preprocedural cardiovascular examination: Secondary | ICD-10-CM | POA: Insufficient documentation

## 2010-06-07 DIAGNOSIS — Z01818 Encounter for other preprocedural examination: Secondary | ICD-10-CM | POA: Insufficient documentation

## 2010-06-07 LAB — SURGICAL PCR SCREEN
MRSA, PCR: NEGATIVE
Staphylococcus aureus: NEGATIVE

## 2010-06-07 LAB — CBC
MCH: 27.6 pg (ref 26.0–34.0)
MCV: 81.1 fL (ref 78.0–100.0)
Platelets: 223 10*3/uL (ref 150–400)
RBC: 4.28 MIL/uL (ref 3.87–5.11)
RDW: 15.4 % (ref 11.5–15.5)

## 2010-06-07 LAB — BASIC METABOLIC PANEL
BUN: 15 mg/dL (ref 6–23)
Calcium: 9.4 mg/dL (ref 8.4–10.5)
Chloride: 106 mEq/L (ref 96–112)
Creatinine, Ser: 1 mg/dL (ref 0.4–1.2)
GFR calc Af Amer: >60 mL/min (ref >60)
GFR calc non Af Amer: 57 mL/min — ABNORMAL LOW (ref >60)

## 2010-06-09 ENCOUNTER — Other Ambulatory Visit: Payer: Self-pay | Admitting: General Surgery

## 2010-06-09 ENCOUNTER — Ambulatory Visit (HOSPITAL_COMMUNITY)
Admission: RE | Admit: 2010-06-09 | Discharge: 2010-06-09 | Disposition: A | Discharge status: Home / Self Care | Payer: Self-pay | Source: Ambulatory Visit | Attending: General Surgery | Admitting: General Surgery

## 2010-06-09 DIAGNOSIS — C549 Malignant neoplasm of corpus uteri, unspecified: Secondary | ICD-10-CM | POA: Insufficient documentation

## 2010-06-09 DIAGNOSIS — C569 Malignant neoplasm of unspecified ovary: Secondary | ICD-10-CM | POA: Insufficient documentation

## 2010-06-09 DIAGNOSIS — Z452 Encounter for adjustment and management of vascular access device: Secondary | ICD-10-CM | POA: Insufficient documentation

## 2010-06-16 NOTE — Op Note (Signed)
  NAMEROSEANA, RHINE NO.:  [id number]  MEDICAL RECORD NO.:  [id number]  LOCATION:                                 FACILITY:  PHYSICIAN:  Gabrielle Dare. Janee Morn, M.D.DATE OF BIRTH:  12/02/1955  DATE OF PROCEDURE:  06/09/2010 DATE OF DISCHARGE:                              OPERATIVE REPORT   PREOPERATIVE DIAGNOSIS:  Port-A-Cath in place no longer needed.  POSTOPERATIVE DIAGNOSIS:  Port-A-Cath in place no longer needed.  PROCEDURE:  Removal of Port-A-Cath.  SURGEON:  Gabrielle Dare. Janee Morn, MD  ANESTHESIA:  MAC.  HISTORY OF PRESENT ILLNESS:  Ms. Komperda is a 55 year old female who completed treatment for ovarian and endometrial carcinoma.  She no longer needs her Port-A-Cath.  She presents for Port-A-Cath removal.  PROCEDURE IN DETAIL:  Informed consent was obtained.  The patient was identified in the preop holding area.  Her site was marked.  She received intravenous antibiotics.  She was brought to the operating room.  MAC anesthesia was administered by the anesthesia staff.  Her right upper chest and neck were prepped and draped in sterile fashion. Marcaine 0.25% with epinephrine was injected along her previous scar just above her palpable Port-A-Cath.  Incision was made along her previous scar.  Subcutaneous tissues were dissected down revealing the Port-A-Cath.  It had somewhat of a fibrous capsule around it.  This was dissected around.  Any sutures holding that in place seemed to be partially dissolved.  These were trimmed away from the Port-A-Cath.  The catheter was then removed from the vein, it came out in one piece. Pressure was then held for several minutes of the entry site in her IJ. Next, the remainder of the fibrous capsule was divided off the Port-A- Cath and it was removed intact and sent to Pathology for gross identification only.  The wound was irrigated.  Meticulous hemostasis was ensured.  Skin was then closed with running 4-0  Monocryl subcuticular stitch followed by Dermabond.  Sponge, needle, and instrument counts were all correct.  The patient tolerated the procedure well without apparent complication and was taken to recovery room in stable condition.     Gabrielle Dare Janee Morn, M.D.    BET/MEDQ  D:  06/09/2010  T:  06/09/2010  Job:  [id number]  cc:   Reece Packer, M.D.  Electronically Signed by Violeta Gelinas M.D. on 06/16/2010 06:32:54 PM

## 2010-08-02 ENCOUNTER — Other Ambulatory Visit: Payer: Self-pay | Admitting: Oncology

## 2010-08-02 ENCOUNTER — Encounter: Payer: Self-pay | Admitting: Oncology

## 2010-09-22 LAB — CBC
HCT: 32.3 — ABNORMAL LOW
Hemoglobin: 10.7 — ABNORMAL LOW
MCV: 76.6 — ABNORMAL LOW
Platelets: 371
WBC: 6.9

## 2010-09-22 LAB — DIFFERENTIAL
Basophils Relative: 1
Eosinophils Relative: 2
Lymphocytes Relative: 22
Lymphs Abs: 1.5
Monocytes Relative: 8
Neutro Abs: 4.6

## 2010-09-22 LAB — WET PREP, GENITAL
Trich, Wet Prep: NONE SEEN
Yeast Wet Prep HPF POC: NONE SEEN

## 2010-11-13 ENCOUNTER — Other Ambulatory Visit: Payer: Self-pay | Admitting: Lab

## 2010-11-13 ENCOUNTER — Ambulatory Visit: Payer: Self-pay | Admitting: Oncology

## 2010-11-20 ENCOUNTER — Encounter: Payer: Self-pay | Admitting: *Deleted

## 2010-11-20 ENCOUNTER — Ambulatory Visit: Payer: Self-pay | Admitting: Radiation Oncology

## 2010-11-20 DIAGNOSIS — C563 Malignant neoplasm of bilateral ovaries: Secondary | ICD-10-CM | POA: Insufficient documentation

## 2010-11-20 DIAGNOSIS — C561 Malignant neoplasm of right ovary: Secondary | ICD-10-CM | POA: Insufficient documentation

## 2010-11-20 DIAGNOSIS — C541 Malignant neoplasm of endometrium: Secondary | ICD-10-CM

## 2010-11-21 ENCOUNTER — Encounter: Payer: Self-pay | Admitting: Radiation Oncology

## 2010-11-21 ENCOUNTER — Other Ambulatory Visit (HOSPITAL_COMMUNITY)
Admission: RE | Admit: 2010-11-21 | Discharge: 2010-11-21 | Disposition: A | Discharge status: Home / Self Care | Payer: Self-pay | Source: Ambulatory Visit | Attending: Radiation Oncology | Admitting: Radiation Oncology

## 2010-11-21 ENCOUNTER — Ambulatory Visit: Admission: RE | Admit: 2010-11-21 | Payer: Self-pay | Source: Ambulatory Visit | Admitting: Radiation Oncology

## 2010-11-21 ENCOUNTER — Ambulatory Visit
Admission: RE | Admit: 2010-11-21 | Discharge: 2010-11-21 | Disposition: A | Discharge status: Home / Self Care | Payer: Self-pay | Source: Ambulatory Visit | Attending: Radiation Oncology | Admitting: Radiation Oncology

## 2010-11-21 VITALS — BP 150/84 | HR 78 | Temp 98.0°F | Resp 20 | Wt 205.5 lb

## 2010-11-21 DIAGNOSIS — Z01419 Encounter for gynecological examination (general) (routine) without abnormal findings: Secondary | ICD-10-CM | POA: Insufficient documentation

## 2010-11-21 DIAGNOSIS — C541 Malignant neoplasm of endometrium: Secondary | ICD-10-CM

## 2010-11-21 NOTE — Progress Notes (Signed)
Encounter addended by: Delynn Flavin, RN on: 11/21/2010  3:07 PM<BR>     Documentation filed: Orders

## 2010-11-21 NOTE — Progress Notes (Signed)
Pt here for f/u endometria & ovar y ca, radiationtherapy tx 10/21/08-11/23/08 and 12/6,12/13/and 12/23-10 Here for pap collection also No pain,  nkda

## 2010-11-21 NOTE — Progress Notes (Signed)
CC:   Lisa A. Duard Brady, MD Lisa Bossier, MD [name], M.D.  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  INTERVAL SINCE RADIATION THERAPY:  One year and 11 months.  NARRATIVE:  [name] comes in today for routine followup.  She clinically seems to be doing well at this time.  The patient continues to follow up with Dr. Duard Madden approximately every 6 months.  The patient denies any new medical issues.  She denies any pelvic pain, abdominal pain or cramping.  She denies any rectal bleeding, vaginal bleeding or blood in her urine.  The patient continues to use her vaginal dilator as recommended.  PHYSICAL EXAMINATION:  The patient's weight is 205 pounds, which is up 3 pounds since her weighing in May of this year.  Examination of the neck and supraclavicular region reveals no evidence of adenopathy.  The axillary areas are free of adenopathy.  Examination of the lungs reveals them to be clear.  The heart has regular rhythm and rate.  Examination of the abdomen reveals it to be soft and nontender with normal bowel sounds.  There is no obvious hepatosplenomegaly.  The inguinal areas are free of adenopathy.  On pelvic examination the external genitalia are unremarkable.  A speculum exam was performed.  Only a small speculum could be used for the exam.  There were no mucosal lesions noted in the vaginal vault.  A Pap smear was obtained of the vaginal cuff region.  On bimanual and rectovaginal examination there are no pelvic masses appreciated.  IMPRESSION AND PLAN:  Clinically NED (no evidence of disease), Pap smear pending.  The patient will return for routine followup in 6 months and in the interim will see Dr. Duard Madden.    ______________________________ Billie Lade, Ph.D., M.D. JDK/MEDQ  D:  11/21/2010  T:  11/21/2010  Job:  [id number]

## 2011-05-07 ENCOUNTER — Ambulatory Visit: Payer: Self-pay | Admitting: Radiation Oncology

## 2011-05-09 ENCOUNTER — Ambulatory Visit: Admission: RE | Admit: 2011-05-09 | Payer: Self-pay | Source: Ambulatory Visit | Admitting: Radiation Oncology

## 2011-05-14 ENCOUNTER — Ambulatory Visit: Payer: Self-pay | Admitting: Radiation Oncology

## 2011-05-21 ENCOUNTER — Ambulatory Visit: Payer: Self-pay | Admitting: Radiation Oncology

## 2011-05-28 ENCOUNTER — Ambulatory Visit: Payer: Self-pay | Admitting: Radiation Oncology

## 2011-09-18 ENCOUNTER — Encounter: Payer: Self-pay | Admitting: Gastroenterology

## 2012-01-22 ENCOUNTER — Other Ambulatory Visit: Payer: Self-pay | Admitting: Oncology

## 2012-01-22 DIAGNOSIS — Z1231 Encounter for screening mammogram for malignant neoplasm of breast: Secondary | ICD-10-CM

## 2012-02-07 ENCOUNTER — Ambulatory Visit (HOSPITAL_COMMUNITY)
Admission: RE | Admit: 2012-02-07 | Discharge: 2012-02-07 | Disposition: A | Discharge status: Home / Self Care | Payer: Self-pay | Source: Ambulatory Visit | Attending: Oncology | Admitting: Oncology

## 2012-02-07 DIAGNOSIS — Z1231 Encounter for screening mammogram for malignant neoplasm of breast: Secondary | ICD-10-CM

## 2014-06-18 ENCOUNTER — Encounter: Payer: Self-pay | Admitting: Gastroenterology

## 2015-02-02 DIAGNOSIS — R188 Other ascites: Secondary | ICD-10-CM

## 2015-02-02 HISTORY — DX: Other ascites: R18.8

## 2015-02-12 ENCOUNTER — Inpatient Hospital Stay (HOSPITAL_COMMUNITY)
Admission: EM | Admit: 2015-02-12 | Discharge: 2015-02-17 | DRG: 264 | Disposition: A | Discharge status: Home / Self Care | Payer: Medicaid Other | Attending: Internal Medicine | Admitting: Internal Medicine

## 2015-02-12 ENCOUNTER — Emergency Department (HOSPITAL_COMMUNITY): Payer: Medicaid Other

## 2015-02-12 ENCOUNTER — Encounter (HOSPITAL_COMMUNITY): Payer: Self-pay | Admitting: Emergency Medicine

## 2015-02-12 DIAGNOSIS — I878 Other specified disorders of veins: Secondary | ICD-10-CM

## 2015-02-12 DIAGNOSIS — R591 Generalized enlarged lymph nodes: Secondary | ICD-10-CM

## 2015-02-12 DIAGNOSIS — C801 Malignant (primary) neoplasm, unspecified: Secondary | ICD-10-CM

## 2015-02-12 DIAGNOSIS — M7989 Other specified soft tissue disorders: Secondary | ICD-10-CM | POA: Diagnosis present

## 2015-02-12 DIAGNOSIS — Z808 Family history of malignant neoplasm of other organs or systems: Secondary | ICD-10-CM | POA: Diagnosis not present

## 2015-02-12 DIAGNOSIS — Z8543 Personal history of malignant neoplasm of ovary: Secondary | ICD-10-CM

## 2015-02-12 DIAGNOSIS — Z90710 Acquired absence of both cervix and uterus: Secondary | ICD-10-CM | POA: Diagnosis not present

## 2015-02-12 DIAGNOSIS — R918 Other nonspecific abnormal finding of lung field: Secondary | ICD-10-CM | POA: Diagnosis present

## 2015-02-12 DIAGNOSIS — Z79899 Other long term (current) drug therapy: Secondary | ICD-10-CM | POA: Diagnosis not present

## 2015-02-12 DIAGNOSIS — Z923 Personal history of irradiation: Secondary | ICD-10-CM

## 2015-02-12 DIAGNOSIS — D72829 Elevated white blood cell count, unspecified: Secondary | ICD-10-CM | POA: Diagnosis present

## 2015-02-12 DIAGNOSIS — I10 Essential (primary) hypertension: Secondary | ICD-10-CM | POA: Diagnosis present

## 2015-02-12 DIAGNOSIS — C55 Malignant neoplasm of uterus, part unspecified: Secondary | ICD-10-CM | POA: Diagnosis present

## 2015-02-12 DIAGNOSIS — E669 Obesity, unspecified: Secondary | ICD-10-CM | POA: Diagnosis present

## 2015-02-12 DIAGNOSIS — T502X5A Adverse effect of carbonic-anhydrase inhibitors, benzothiadiazides and other diuretics, initial encounter: Secondary | ICD-10-CM | POA: Diagnosis present

## 2015-02-12 DIAGNOSIS — C774 Secondary and unspecified malignant neoplasm of inguinal and lower limb lymph nodes: Secondary | ICD-10-CM | POA: Diagnosis present

## 2015-02-12 DIAGNOSIS — R109 Unspecified abdominal pain: Secondary | ICD-10-CM | POA: Diagnosis present

## 2015-02-12 DIAGNOSIS — Z9119 Patient's noncompliance with other medical treatment and regimen: Secondary | ICD-10-CM

## 2015-02-12 DIAGNOSIS — I82492 Acute embolism and thrombosis of other specified deep vein of left lower extremity: Secondary | ICD-10-CM

## 2015-02-12 DIAGNOSIS — Z6832 Body mass index (BMI) 32.0-32.9, adult: Secondary | ICD-10-CM

## 2015-02-12 DIAGNOSIS — C541 Malignant neoplasm of endometrium: Secondary | ICD-10-CM

## 2015-02-12 DIAGNOSIS — I82402 Acute embolism and thrombosis of unspecified deep veins of left lower extremity: Secondary | ICD-10-CM

## 2015-02-12 DIAGNOSIS — R5383 Other fatigue: Secondary | ICD-10-CM | POA: Diagnosis present

## 2015-02-12 DIAGNOSIS — D638 Anemia in other chronic diseases classified elsewhere: Secondary | ICD-10-CM | POA: Diagnosis present

## 2015-02-12 DIAGNOSIS — E876 Hypokalemia: Secondary | ICD-10-CM | POA: Diagnosis present

## 2015-02-12 DIAGNOSIS — D509 Iron deficiency anemia, unspecified: Secondary | ICD-10-CM

## 2015-02-12 DIAGNOSIS — E43 Unspecified severe protein-calorie malnutrition: Secondary | ICD-10-CM | POA: Diagnosis present

## 2015-02-12 DIAGNOSIS — C569 Malignant neoplasm of unspecified ovary: Secondary | ICD-10-CM

## 2015-02-12 DIAGNOSIS — R599 Enlarged lymph nodes, unspecified: Secondary | ICD-10-CM | POA: Diagnosis present

## 2015-02-12 LAB — PROTIME-INR
INR: 1.19 (ref 0.00–1.49)
PROTHROMBIN TIME: 15.2 s (ref 11.6–15.2)

## 2015-02-12 LAB — CBC
HCT: 27.8 % — ABNORMAL LOW (ref 36.0–46.0)
Hemoglobin: 8.4 g/dL — ABNORMAL LOW (ref 12.0–15.0)
MCH: 21.3 pg — ABNORMAL LOW (ref 26.0–34.0)
MCHC: 30.2 g/dL (ref 30.0–36.0)
MCV: 70.4 fL — AB (ref 78.0–100.0)
PLATELETS: 404 10*3/uL — AB (ref 150–400)
RBC: 3.95 MIL/uL (ref 3.87–5.11)
RDW: 19.7 % — AB (ref 11.5–15.5)
WBC: 10.6 10*3/uL — AB (ref 4.0–10.5)

## 2015-02-12 LAB — BASIC METABOLIC PANEL
ANION GAP: 12 (ref 5–15)
BUN: 14 mg/dL (ref 6–20)
CALCIUM: 9.2 mg/dL (ref 8.9–10.3)
CO2: 26 mmol/L (ref 22–32)
Chloride: 101 mmol/L (ref 101–111)
Creatinine, Ser: 0.89 mg/dL (ref 0.44–1.00)
GFR calc Af Amer: >60 mL/min (ref >60)
GLUCOSE: 106 mg/dL — AB (ref 65–99)
Potassium: 3.1 mmol/L — ABNORMAL LOW (ref 3.5–5.1)
Sodium: 139 mmol/L (ref 135–145)

## 2015-02-12 LAB — APTT: aPTT: 31 seconds (ref 24–37)

## 2015-02-12 LAB — POC OCCULT BLOOD, ED: Fecal Occult Bld: NEGATIVE

## 2015-02-12 MED ORDER — IOHEXOL 300 MG/ML  SOLN
100.0000 mL | Freq: Once | INTRAMUSCULAR | Status: AC | PRN
  Administered 2015-02-12: 100 mL via INTRAVENOUS

## 2015-02-12 MED ORDER — IOHEXOL 300 MG/ML  SOLN
25.0000 mL | Freq: Once | INTRAMUSCULAR | Status: AC | PRN
  Administered 2015-02-12: 25 mL via ORAL

## 2015-02-12 MED ORDER — HEPARIN BOLUS VIA INFUSION
4000.0000 [IU] | Freq: Once | INTRAVENOUS | Status: AC
  Administered 2015-02-12: 4000 [IU] via INTRAVENOUS
  Filled 2015-02-12: qty 4000

## 2015-02-12 MED ORDER — HYDROMORPHONE HCL 1 MG/ML IJ SOLN
1.0000 mg | Freq: Once | INTRAMUSCULAR | Status: AC
  Administered 2015-02-12: 1 mg via INTRAVENOUS
  Filled 2015-02-12: qty 1

## 2015-02-12 MED ORDER — HYDRALAZINE HCL 20 MG/ML IJ SOLN
10.0000 mg | INTRAMUSCULAR | Status: DC | PRN
  Administered 2015-02-13 (×2): 10 mg via INTRAVENOUS
  Filled 2015-02-12 (×2): qty 1

## 2015-02-12 MED ORDER — DIPHENHYDRAMINE HCL 50 MG/ML IJ SOLN
25.0000 mg | Freq: Once | INTRAMUSCULAR | Status: AC
  Administered 2015-02-12: 25 mg via INTRAVENOUS
  Filled 2015-02-12: qty 1

## 2015-02-12 MED ORDER — ONDANSETRON HCL 4 MG/2ML IJ SOLN
4.0000 mg | Freq: Once | INTRAMUSCULAR | Status: AC
  Administered 2015-02-12: 4 mg via INTRAVENOUS
  Filled 2015-02-12: qty 2

## 2015-02-12 MED ORDER — POTASSIUM CHLORIDE CRYS ER 20 MEQ PO TBCR
40.0000 meq | EXTENDED_RELEASE_TABLET | Freq: Once | ORAL | Status: AC
  Administered 2015-02-12: 40 meq via ORAL
  Filled 2015-02-12: qty 2

## 2015-02-12 MED ORDER — HEPARIN (PORCINE) IN NACL 100-0.45 UNIT/ML-% IJ SOLN
1400.0000 [IU]/h | INTRAMUSCULAR | Status: DC
  Administered 2015-02-12: 1200 [IU]/h via INTRAVENOUS
  Administered 2015-02-13: 1350 [IU]/h via INTRAVENOUS
  Administered 2015-02-14 – 2015-02-15 (×2): 1400 [IU]/h via INTRAVENOUS
  Filled 2015-02-12 (×4): qty 250

## 2015-02-12 NOTE — H&P (Signed)
[facility] Admission History and Physical       [name] [id number] DOB: 1955-02-23 DOA: 02/12/2015  Referring physician: EDP PCP: No primary care provider on file.  Specialists:   Chief Complaint: Left Leg Swelling     HPI: [name] is a 60 y.o. female with a history of Uterine Cancer dx in 2010 S/P TAH/BSO , XRT and Chemotherapy, history of HTN who presents to the ED with complaints of left leg swelling and pain x 3 weeks.   She also reports having ABD and Flank pain for the past 3 months and a "knot" in her right groin area x 1.5  To 2 months.    A Ct scan of the ABD was performed in the ED and revealed evidence of recurrence of neoplastic disease, along with an acute DVT of the LLE.    An IV heparin drip was started and she was referred for admission.     Review of Systems:  Constitutional: No Weight Loss, No Weight Gain, Night Sweats, Fevers, Chills, Dizziness, Light Headedness, Fatigue, or Generalized Weakness HEENT: No Headaches, Difficulty Swallowing,Tooth/Dental Problems,Sore Throat,  No Sneezing, Rhinitis, Ear Ache, Nasal Congestion, or Post Nasal Drip,  Cardio-vascular:  No Chest pain, Orthopnea, PND, +Edema in Lower Extremities, Anasarca, Dizziness, Palpitations  Resp: No Dyspnea, No DOE, No Productive Cough, No Non-Productive Cough, No Hemoptysis, No Wheezing.    GI: No Heartburn, Indigestion, +Abdominal Pain, Nausea, Vomiting, Diarrhea, Constipation, Hematemesis, Hematochezia, Melena, Change in Bowel Habits,  Loss of Appetite  GU: No Dysuria, No Change in Color of Urine, No Urgency or Urinary Frequency, +Right Groin Pain.  Musculoskeletal: No Joint Pain or Swelling, No Decreased Range of Motion, +Back Pain.  Neurologic: No Syncope, No Seizures, Muscle Weakness, Paresthesia, Vision Disturbance or Loss, No Diplopia, No Vertigo, No Difficulty Walking,  Skin: No Rash or Lesions. Psych: No Change in Mood or Affect, No Depression or Anxiety, No Memory  loss, No Confusion, or Hallucinations   Past Medical History  Diagnosis Date  . History of radiation therapy 10/21/08-11/23/08 & 12/07/10/13/12/23 2010    ENDOMETRIOID   . Uterine cancer (HCC)     ENDOMETRIAL CA& OVARY CANCER  . Hypertension      Past Surgical History  Procedure Laterality Date  . Tubal ligation        Prior to Admission medications   Medication Sig Start Date End Date Taking? Authorizing Provider  cloNIDine (CATAPRES) 0.1 MG tablet Take 0.1 mg by mouth at bedtime.     Yes Historical Provider, MD  hydrochlorothiazide (HYDRODIURIL) 25 MG tablet Take 25 mg by mouth daily.   11/01/08  Yes Historical Provider, MD  naproxen sodium (ANAPROX) 220 MG tablet Take 440 mg by mouth daily as needed (pain).    Yes Historical Provider, MD     No Known Allergies  Social History:  reports that she has never smoked. She does not have any smokeless tobacco history on file. She reports that she does not drink alcohol or use illicit drugs.    Family History  Problem Relation Age of Onset  . Bone cancer Paternal Grandmother     OSSEOUS METASTASIS       Physical Exam:  GEN:  Pleasant Obese 60 y.o. African American female examined and in no acute distress; cooperative with exam Filed Vitals:   02/12/15 1945 02/12/15 2000 02/12/15 2100 02/12/15 2138  BP:    226/92  Pulse: 100 87 77 76  Temp:    97.7 F (36.5  C)  TempSrc:    Oral  Resp: 16 19 20 14   SpO2: 98% 97% 93% 89%   Blood pressure 226/92, pulse 76, temperature 97.7 F (36.5 C), temperature source Oral, resp. rate 14, SpO2 89 %. PSYCH: She is alert and oriented x4; does not appear anxious does not appear depressed; affect is normal HEENT: Normocephalic and Atraumatic, Mucous membranes pink; PERRLA; EOM intact; Fundi:  Benign;  No scleral icterus, Nares: Patent, Oropharynx: Clear,+ Upper palate Torus present, Fair Dentition,    Neck:  FROM, No Cervical Lymphadenopathy nor Thyromegaly or Carotid Bruit; No  JVD; Breasts:: Not examined CHEST WALL: No tenderness CHEST: Normal respiration, clear to auscultation bilaterally HEART: Regular rate and rhythm; no murmurs rubs or gallops BACK: No kyphosis or scoliosis; No CVA tenderness ABDOMEN: Positive Bowel Sounds, Scaphoid, Obese, Soft Non-Tender, No Rebound or Guarding; No Masses, No Organomegaly, . Rectal Exam: Not done EXTREMITIES: No Cyanosis, Clubbing, or +LLE 2+Edema; Genitalia: not examined PULSES: 2+ and symmetric SKIN: Normal hydration no rash or ulceration CNS:  Alert and Oriented x 4, No Focal Deficits Vascular: pulses palpable throughout    Labs on Admission:  Basic Metabolic Panel:  Recent Labs Lab 02/12/15 1615  NA 139  K 3.1*  CL 101  CO2 26  GLUCOSE 106*  BUN 14  CREATININE 0.89  CALCIUM 9.2   Liver Function Tests: No results for input(s): AST, ALT, ALKPHOS, BILITOT, PROT, ALBUMIN in the last 168 hours. No results for input(s): LIPASE, AMYLASE in the last 168 hours. No results for input(s): AMMONIA in the last 168 hours. CBC:  Recent Labs Lab 02/12/15 1615  WBC 10.6*  HGB 8.4*  HCT 27.8*  MCV 70.4*  PLT 404*   Cardiac Enzymes: No results for input(s): CKTOTAL, CKMB, CKMBINDEX, TROPONINI in the last 168 hours.  BNP (last 3 results) No results for input(s): BNP in the last 8760 hours.  ProBNP (last 3 results) No results for input(s): PROBNP in the last 8760 hours.  CBG: No results for input(s): GLUCAP in the last 168 hours.  Radiological Exams on Admission: Ct Abdomen Pelvis W Contrast  02/12/2015  CLINICAL DATA:  Back and flank pain, left leg swelling/ pain, palpable abnormality in right groin, history of uterine cancer status post XRT EXAM: CT ABDOMEN AND PELVIS WITH CONTRAST TECHNIQUE: Multidetector CT imaging of the abdomen and pelvis was performed using the standard protocol following bolus administration of intravenous contrast. CONTRAST:  139mL OMNIPAQUE IOHEXOL 300 MG/ML  SOLN COMPARISON:   12/06/2009 FINDINGS: Lower chest: 2.4 x 1.9 cm left lower lobe nodule (series 6/ image 3), suspicious for metastasis. Additional subcentimeter nodules in the visualized lung bases. Hepatobiliary: Liver is within normal limits. No suspicious/enhancing hepatic lesions. Gallbladder is unremarkable. No intrahepatic or extrahepatic ductal dilatation. Pancreas: Within normal limits. Spleen: Within normal limits. Adrenals/Urinary Tract: Adrenal glands are within normal limits. 5.8 cm cyst in the lateral right lower kidney (series 2/ image 35). Additional 10 mm lateral right lower pole renal cyst (series 2/image 28). 6 mm nonobstructing right upper pole renal calculus (series 2/ image 26). No hydronephrosis. Left kidney is within normal limits. Bladder is within normal limits. Stomach/Bowel: Stomach is notable for a small hiatal hernia. No evidence of bowel obstruction. Normal appendix (series 2/image 55). Vascular/Lymphatic: No evidence of abdominal aortic aneurysm. Thrombus within the left external iliac vein (series 2/image 56) extending into the left common femoral vein (series 2/ image 68) and distally into the left lower extremity (series 2/image 81), likely secondary to  extrinsic compression by pelvic tumor. Retroperitoneal/pelvic nodal metastases, including: --1.8 cm short axis left para-aortic node (series 2/ image 29) --1.0 cm short axis inferior aortocaval node (series 2/image 39) --2.7 cm short axis necrotic left external iliac node (series 2/image 63) --8.5 x 7.1 cm necrotic right inguinal node (series 2/image 33), corresponding the palpable abnormality Reproductive: Status post hysterectomy. Bilateral ovaries are favored to be surgically absent. Other: No abdominopelvic ascites. Tiny fat containing periumbilical hernia (series 2/ image 65). Musculoskeletal: 4.7 x 5.2 x 6.1 cm low-density lesion involving the left psoas muscle (series 2/ image 47), favored to reflect tumor, less likely infection. Associated  sclerosis involving the left aspect of the L4 and L5 vertebral bodies (series 2/ images 43 and 47), favored to reflect osseous metastases, less likely infection. IMPRESSION: Status post hysterectomy. 8.5 x 7.1 cm necrotic right inguinal node, corresponding to the palpable abnormality, suspicious for nodal metastasis. Additional retroperitoneal/ left pelvic nodal metastases, as above. 6.1 cm low-density lesion in the left psoas muscle, favored to reflect tumor, less likely infection. Secondary involvement of the left L4 and L5 vertebral bodies. Associated acute deep venous thrombosis involving the left external iliac vein and extending into the left lower extremity, likely secondary to extrinsic compression by pelvic tumor. Pulmonary nodules/metastases at the lung bases, measuring up to 2.4 cm in the left lower lobe. These results were called by telephone at the time of interpretation on 02/12/2015 at 9:37 pm to Dr. Lacretia Leigh, who verbally acknowledged these results. Electronically Signed   By: Julian Hy M.D.   On: 02/12/2015 21:39       Assessment/Plan:     60 y.o. female with  Principal Problem:    1.    DVT (deep venous thrombosis), left    IV Heparin Drip   Active Problems:     2.   Uterine cancer [facility])- recurrence and spread on CT scan    Consult Oncology in AM to evaluate for plan of care       3.   Hypokalemia- due to HCTZ Rx    Replace with Oral KCl    Check Magnesium level    4.   Essential hypertension    Continue on Clonidine and HCTZ Rx   5.   DVT  Prophylaxis    On IV Heparin Drip    Code Status:     FULL CODE       Family Communication:   Daughter at Bedside    Disposition Plan:    Inpatient  Status        Time spent:  62 Minutes      Theressa Millard [facility] Pager [phone]   If [place] Please Contact the Day Rounding Team MD for [facility]  If 7PM-7AM, Please Contact Night-Floor Coverage  [email or web address] Password [email or web address] 02/12/2015,  10:23 PM     ADDENDUM:   Patient was seen and examined on 02/12/2015

## 2015-02-12 NOTE — ED Notes (Signed)
Pt reports left leg swelling/pain and knot to right groin; both complaint for 3 weeks.

## 2015-02-12 NOTE — ED Notes (Signed)
Pt has swelling in left leg with increased pain. Pt is able to ambulate. Pulses are palpable

## 2015-02-12 NOTE — ED Notes (Signed)
Hospitalist at bedside 

## 2015-02-12 NOTE — Progress Notes (Signed)
ANTICOAGULATION CONSULT NOTE - Initial Consult  Pharmacy Consult for Heparin Indication: DVT  No Known Allergies  Patient Measurements: Height: 5\' 2"  (157.5 cm) Weight: 205 lb (92.987 kg) IBW/kg (Calculated) : 50.1 Heparin Dosing Weight: 71 kg  Vital Signs: Temp: 97.7 F (36.5 C) (02/11 2138) Temp Source: Oral (02/11 2138) BP: 226/92 mmHg (02/11 2138) Pulse Rate: 76 (02/11 2138)  Labs:  Recent Labs  02/12/15 1615  HGB 8.4*  HCT 27.8*  PLT 404*  CREATININE 0.89    Estimated Creatinine Clearance: 70.5 mL/min (by C-G formula based on Cr of 0.89).   Medical History: Past Medical History  Diagnosis Date  . History of radiation therapy 10/21/08-11/23/08 & 12/07/10/13/12/23 2010    ENDOMETRIOID   . Uterine cancer (HCC)     ENDOMETRIAL CA& OVARY CANCER  . Hypertension     Medications:  Scheduled:  . heparin  4,000 Units Intravenous Once   Infusions:  . heparin      Assessment:  60 yr female with h/o uterine cancer presents with left leg swelling/pain and knot to groin  Abdominal CT shows +DVT  Pt on no oral anticoagulation PTA  Goal of Therapy:  Heparin level 0.3-0.7 units/ml Monitor platelets by anticoagulation protocol: Yes   Plan:   Obtain baseline aPTT and PT/INR  Heparin 4000 unit IV bolus x 1 followed by infusion @ 1200 units/hr  Check heparin level 6 hr after heparin started  Follow heparin level & CBC daily   [name], Toribio Harbour, PharmD 02/12/2015,10:50 PM

## 2015-02-12 NOTE — ED Provider Notes (Signed)
CSN: [id number]     Arrival date & time 02/12/15  1552 History   First MD Initiated Contact with Patient 02/12/15 1917     Chief Complaint  Patient presents with  . Leg Swelling  . Knot on Leg      (Consider location/radiation/quality/duration/timing/severity/associated sxs/prior Treatment) HPI Comments: Patient here complaining of three-week history of left-sided flank pain that radiates to her left thigh. She also complains of having swelling to her right groin times several months. The right groin swelling was thought to be an abscess and was lanced by her daughter whose pediatrician. She noted copious drainage at that time. Says that gradually over the past several weeks it is a reticulated. She denies any fever or chills. Denies any dysuria or hematuria. Pain is sharp and persistent and worse with standing and better with rest. No treatment used prior to arrival  The history is provided by the patient.    Past Medical History  Diagnosis Date  . History of radiation therapy 10/21/08-11/23/08 & 12/07/10/13/12/23 2010    ENDOMETRIOID   . Uterine cancer (HCC)     ENDOMETRIAL CA& OVARY CANCER  . Hypertension    Past Surgical History  Procedure Laterality Date  . Tubal ligation     Family History  Problem Relation Age of Onset  . Bone cancer Paternal Grandmother     OSSEOUS METASTASIS   Social History  Substance Use Topics  . Smoking status: Never Smoker   . Smokeless tobacco: None  . Alcohol Use: No   OB History    No data available     Review of Systems  All other systems reviewed and are negative.     Allergies  Review of patient's allergies indicates no known allergies.  Home Medications   Prior to Admission medications   Medication Sig Start Date End Date Taking? Authorizing Provider  cloNIDine (CATAPRES) 0.1 MG tablet Take 0.1 mg by mouth at bedtime.     Yes Historical Provider, MD  hydrochlorothiazide (HYDRODIURIL) 25 MG tablet Take 25 mg by mouth daily.    11/01/08  Yes Historical Provider, MD  naproxen sodium (ANAPROX) 220 MG tablet Take 440 mg by mouth daily as needed (pain).    Yes Historical Provider, MD   BP 173/93 mmHg  Pulse 109  Temp(Src) 98 F (36.7 C) (Oral)  Resp 18  SpO2 100% Physical Exam  Constitutional: She is oriented to person, place, and time. She appears well-developed and well-nourished.  Non-toxic appearance. No distress.  HENT:  Head: Normocephalic and atraumatic.  Eyes: Conjunctivae, EOM and lids are normal. Pupils are equal, round, and reactive to light.  Neck: Normal range of motion. Neck supple. No tracheal deviation present. No thyroid mass present.  Cardiovascular: Regular rhythm and normal heart sounds.  Tachycardia present.  Exam reveals no gallop.   No murmur heard. Pulmonary/Chest: Effort normal and breath sounds normal. No stridor. No respiratory distress. She has no decreased breath sounds. She has no wheezes. She has no rhonchi. She has no rales.  Abdominal: Soft. Normal appearance and bowel sounds are normal. She exhibits no distension. There is no tenderness. There is no rebound and no CVA tenderness.    Musculoskeletal: Normal range of motion. She exhibits no edema or tenderness.       Legs: Neurological: She is alert and oriented to person, place, and time. She has normal strength. No cranial nerve deficit or sensory deficit. GCS eye subscore is 4. GCS verbal subscore is 5. GCS motor subscore  is 6.  Skin: Skin is warm and dry. No abrasion and no rash noted.  Psychiatric: She has a normal mood and affect. Her speech is normal and behavior is normal.  Nursing note and vitals reviewed.   ED Course  Procedures (including critical care time) Labs Review Labs Reviewed  CBC - Abnormal; Notable for the following:    WBC 10.6 (*)    Hemoglobin 8.4 (*)    HCT 27.8 (*)    MCV 70.4 (*)    MCH 21.3 (*)    RDW 19.7 (*)    Platelets 404 (*)    All other components within normal limits  BASIC METABOLIC  PANEL - Abnormal; Notable for the following:    Potassium 3.1 (*)    Glucose, Bld 106 (*)    All other components within normal limits  POC OCCULT BLOOD, ED    Imaging Review No results found. I have personally reviewed and evaluated these images and lab results as part of my medical decision-making.   EKG Interpretation None      MDM   Final diagnoses:  None    Patient's abdominal CT noted and is positive for metastatic uterine cancer as well as DVT. She was given pain medication here as well as her potassium was replenished. Will be admitted to the hospitalist    Lacretia Leigh, MD 02/12/15 2157

## 2015-02-13 DIAGNOSIS — I82422 Acute embolism and thrombosis of left iliac vein: Secondary | ICD-10-CM

## 2015-02-13 DIAGNOSIS — R591 Generalized enlarged lymph nodes: Secondary | ICD-10-CM

## 2015-02-13 LAB — BASIC METABOLIC PANEL
Anion gap: 11 (ref 5–15)
BUN: 11 mg/dL (ref 6–20)
CALCIUM: 8.9 mg/dL (ref 8.9–10.3)
CHLORIDE: 102 mmol/L (ref 101–111)
CO2: 25 mmol/L (ref 22–32)
CREATININE: 0.73 mg/dL (ref 0.44–1.00)
GFR calc Af Amer: >60 mL/min (ref >60)
GFR calc non Af Amer: >60 mL/min (ref >60)
GLUCOSE: 117 mg/dL — AB (ref 65–99)
Potassium: 3.7 mmol/L (ref 3.5–5.1)
Sodium: 138 mmol/L (ref 135–145)

## 2015-02-13 LAB — CBC
HCT: 25.2 % — ABNORMAL LOW (ref 36.0–46.0)
Hemoglobin: 7.6 g/dL — ABNORMAL LOW (ref 12.0–15.0)
MCH: 21.3 pg — AB (ref 26.0–34.0)
MCHC: 30.2 g/dL (ref 30.0–36.0)
MCV: 70.6 fL — AB (ref 78.0–100.0)
PLATELETS: 379 10*3/uL (ref 150–400)
RBC: 3.57 MIL/uL — ABNORMAL LOW (ref 3.87–5.11)
RDW: 19.7 % — ABNORMAL HIGH (ref 11.5–15.5)
WBC: 13.9 10*3/uL — ABNORMAL HIGH (ref 4.0–10.5)

## 2015-02-13 LAB — IRON AND TIBC
IRON: 14 ug/dL — AB (ref 28–170)
SATURATION RATIOS: 7 % — AB (ref 10.4–31.8)
TIBC: 214 ug/dL — AB (ref 250–450)
UIBC: 200 ug/dL

## 2015-02-13 LAB — MAGNESIUM: Magnesium: 1.9 mg/dL (ref 1.7–2.4)

## 2015-02-13 LAB — HEPARIN LEVEL (UNFRACTIONATED)
HEPARIN UNFRACTIONATED: 0.31 [IU]/mL (ref 0.30–0.70)
Heparin Unfractionated: 0.2 IU/mL — ABNORMAL LOW (ref 0.30–0.70)
Heparin Unfractionated: 0.46 IU/mL (ref 0.30–0.70)

## 2015-02-13 LAB — FERRITIN: FERRITIN: 216 ng/mL (ref 11–307)

## 2015-02-13 LAB — TSH: TSH: 1.67 u[IU]/mL (ref 0.350–4.500)

## 2015-02-13 LAB — FOLATE: FOLATE: 14.8 ng/mL (ref >5.9)

## 2015-02-13 LAB — VITAMIN B12: Vitamin B-12: 399 pg/mL (ref 180–914)

## 2015-02-13 MED ORDER — SODIUM CHLORIDE 0.9 % IV SOLN: 250.0000 mL | INTRAVENOUS | Status: DC | PRN

## 2015-02-13 MED ORDER — ACETAMINOPHEN 650 MG RE SUPP: 650.0000 mg | Freq: Four times a day (QID) | RECTAL | Status: DC | PRN

## 2015-02-13 MED ORDER — SODIUM CHLORIDE 0.9% FLUSH
3.0000 mL | Freq: Two times a day (BID) | INTRAVENOUS | Status: DC
  Administered 2015-02-13 – 2015-02-16 (×5): 3 mL via INTRAVENOUS

## 2015-02-13 MED ORDER — DIPHENHYDRAMINE HCL 25 MG PO CAPS
25.0000 mg | ORAL_CAPSULE | Freq: Four times a day (QID) | ORAL | Status: DC | PRN
  Administered 2015-02-13 – 2015-02-16 (×5): 25 mg via ORAL
  Filled 2015-02-13 (×5): qty 1

## 2015-02-13 MED ORDER — CLONIDINE HCL 0.1 MG PO TABS
0.1000 mg | ORAL_TABLET | Freq: Three times a day (TID) | ORAL | Status: DC
  Administered 2015-02-13 – 2015-02-17 (×13): 0.1 mg via ORAL
  Filled 2015-02-13 (×13): qty 1

## 2015-02-13 MED ORDER — ZOLPIDEM TARTRATE 5 MG PO TABS
5.0000 mg | ORAL_TABLET | Freq: Every evening | ORAL | Status: DC | PRN
  Administered 2015-02-13 – 2015-02-15 (×4): 5 mg via ORAL
  Filled 2015-02-13 (×4): qty 1

## 2015-02-13 MED ORDER — OXYCODONE HCL 5 MG PO TABS: 5.0000 mg | ORAL_TABLET | ORAL | Status: DC | PRN

## 2015-02-13 MED ORDER — ALUM & MAG HYDROXIDE-SIMETH 200-200-20 MG/5ML PO SUSP: 30.0000 mL | Freq: Four times a day (QID) | ORAL | Status: DC | PRN

## 2015-02-13 MED ORDER — CLONIDINE HCL 0.1 MG PO TABS
0.1000 mg | ORAL_TABLET | Freq: Every day | ORAL | Status: DC
  Administered 2015-02-13: 0.1 mg via ORAL
  Filled 2015-02-13: qty 1

## 2015-02-13 MED ORDER — HYDROMORPHONE HCL 1 MG/ML IJ SOLN
0.5000 mg | INTRAMUSCULAR | Status: DC | PRN
  Administered 2015-02-13 – 2015-02-14 (×2): 1 mg via INTRAVENOUS
  Filled 2015-02-13 (×2): qty 1

## 2015-02-13 MED ORDER — HYDROMORPHONE HCL 1 MG/ML IJ SOLN
0.5000 mg | INTRAMUSCULAR | Status: DC | PRN
  Administered 2015-02-13: 1 mg via INTRAVENOUS
  Filled 2015-02-13: qty 1

## 2015-02-13 MED ORDER — OXYCODONE HCL 5 MG PO TABS
5.0000 mg | ORAL_TABLET | ORAL | Status: DC | PRN
  Administered 2015-02-13: 5 mg via ORAL
  Filled 2015-02-13: qty 1

## 2015-02-13 MED ORDER — ACETAMINOPHEN 325 MG PO TABS
650.0000 mg | ORAL_TABLET | Freq: Four times a day (QID) | ORAL | Status: DC | PRN
Start: 2015-02-13 — End: 2015-02-17

## 2015-02-13 MED ORDER — ONDANSETRON HCL 4 MG PO TABS
4.0000 mg | ORAL_TABLET | Freq: Four times a day (QID) | ORAL | Status: DC | PRN
Start: 2015-02-13 — End: 2015-02-17
  Administered 2015-02-15: 4 mg via ORAL
  Filled 2015-02-13: qty 1

## 2015-02-13 MED ORDER — ONDANSETRON HCL 4 MG/2ML IJ SOLN
4.0000 mg | Freq: Four times a day (QID) | INTRAMUSCULAR | Status: DC | PRN
  Administered 2015-02-13 – 2015-02-14 (×3): 4 mg via INTRAVENOUS
  Filled 2015-02-13 (×3): qty 2

## 2015-02-13 MED ORDER — LABETALOL HCL 100 MG PO TABS
100.0000 mg | ORAL_TABLET | Freq: Three times a day (TID) | ORAL | Status: DC
  Administered 2015-02-13 – 2015-02-16 (×11): 100 mg via ORAL
  Filled 2015-02-13 (×12): qty 1

## 2015-02-13 MED ORDER — HYDROCHLOROTHIAZIDE 25 MG PO TABS
25.0000 mg | ORAL_TABLET | Freq: Every day | ORAL | Status: DC
  Administered 2015-02-13 – 2015-02-17 (×6): 25 mg via ORAL
  Filled 2015-02-13 (×6): qty 1

## 2015-02-13 MED ORDER — SODIUM CHLORIDE 0.9% FLUSH
3.0000 mL | Freq: Two times a day (BID) | INTRAVENOUS | Status: DC
  Administered 2015-02-13 – 2015-02-17 (×8): 3 mL via INTRAVENOUS

## 2015-02-13 MED ORDER — ENSURE ENLIVE PO LIQD
237.0000 mL | Freq: Two times a day (BID) | ORAL | Status: DC
  Administered 2015-02-13 – 2015-02-17 (×6): 237 mL via ORAL

## 2015-02-13 MED ORDER — SODIUM CHLORIDE 0.9% FLUSH: 3.0000 mL | INTRAVENOUS | Status: DC | PRN

## 2015-02-13 NOTE — Progress Notes (Signed)
Initial Nutrition Assessment  DOCUMENTATION CODES:   Severe malnutrition in context of chronic illness, Obesity unspecified  INTERVENTION:   -Continue Ensure Enlive po BID, each supplement provides 350 kcal and 20 grams of protein -Place on meal order with assist per patient request -Encourage PO intake -RD to continue to monitor  NUTRITION DIAGNOSIS:   Malnutrition related to chronic illness as evidenced by energy intake < or equal to 75% for > or equal to 1 month, percent weight loss.  GOAL:   Patient will meet greater than or equal to 90% of their needs  MONITOR:   PO intake, Supplement acceptance, Labs, Weight trends, I & O's  REASON FOR ASSESSMENT:   Malnutrition Screening Tool    ASSESSMENT:   60 y.o. female with a history of Uterine Cancer dx in 2010 S/P TAH/BSO , XRT and Chemotherapy, history of HTN who presents to the ED with complaints of left leg swelling and pain x 3 weeks. She also reports having ABD and Flank pain for the past 3 months and a "knot" in her right groin area x 1.5 To 2 months.   Patient in room with family present at bedside. Pt reports poor appetite for the past 1.5 months which has resulted in weight loss. Pt states that she has been in so much pain she has been drinking Ensure supplements mainly. Pt has not eaten anything today as she has been falling asleep and forgetting to order meals. Will place on meal order with assist. She did drink 1 Ensure. Per patient, she weighed 205 lb 1.5 months ago (12% weight loss x 1.5 months, significant for time frame). Nutrition focused physical exam shows no sign of depletion of muscle mass or body fat.  Labs and meds reviewed.   Diet Order:  Diet Heart Room service appropriate?: Yes; Fluid consistency:: Thin Diet NPO time specified Except for: Sips with Meds  Skin:  Reviewed, no issues  Last BM:  2/11  Height:   Ht Readings from Last 1 Encounters:  02/13/15 5\' 2"  (1.575 m)    Weight:   Wt  Readings from Last 1 Encounters:  02/13/15 180 lb 3.2 oz (81.738 kg)    Ideal Body Weight:  50 kg  BMI:  Body mass index is 32.95 kg/(m^2).  Estimated Nutritional Needs:   Kcal:  1600-1800  Protein:  70-80g  Fluid:  1.8L/day  EDUCATION NEEDS:   No education needs identified at this time  Clayton Bibles, MS, RD, LDN Pager: [phone] After Hours Pager: [phone]

## 2015-02-13 NOTE — H&P (Signed)
Chief Complaint: Enlarged groin lymph node  Referring Physician(s): SHORT, MACKENZIE  History of Present Illness: [name] is a 60 y.o. female with a history of Uterine Cancer dx in 2010 S/P TAH/BSO , XRT and Chemotherapy who presented to the ED with complaints of left leg swelling and pain x 3 weeks.   She also reports having ABD and Flank pain for the past 3 months and a "knot" in her right groin area x 1.5 To 2 months.   CTscan revealed evidence of recurrence of neoplastic disease, along with an acute DVT of the LLE.   We are asked to evaluate her for biopsy.  An IV heparin drip was started to treat the DVT.  She denies any fever or chills.  She did not previously take any blood thinners.  Past Medical History  Diagnosis Date  . History of radiation therapy 10/21/08-11/23/08 & 12/07/10/13/12/23 2010    ENDOMETRIOID   . Uterine cancer (HCC)     ENDOMETRIAL CA& OVARY CANCER  . Hypertension     Past Surgical History  Procedure Laterality Date  . Tubal ligation      Allergies: Review of patient's allergies indicates no known allergies.  Medications: Prior to Admission medications   Medication Sig Start Date End Date Taking? Authorizing Provider  cloNIDine (CATAPRES) 0.1 MG tablet Take 0.1 mg by mouth at bedtime.     Yes Historical Provider, MD  hydrochlorothiazide (HYDRODIURIL) 25 MG tablet Take 25 mg by mouth daily.   11/01/08  Yes Historical Provider, MD  naproxen sodium (ANAPROX) 220 MG tablet Take 440 mg by mouth daily as needed (pain).    Yes Historical Provider, MD     Family History  Problem Relation Age of Onset  . Bone cancer Paternal Grandmother     OSSEOUS METASTASIS    Social History   Social History  . Marital Status: Married    Spouse Name: N/A  . Number of Children: N/A  . Years of Education: N/A   Social History Main Topics  . Smoking status: Never Smoker   . Smokeless tobacco: None  . Alcohol Use: No  . Drug Use: No   . Sexual Activity: Not Asked   Other Topics Concern  . None   Social History Narrative    Review of Systems: A 12 point ROS discussed and pertinent positives are indicated in the HPI above.  All other systems are negative.  Review of Systems  Constitutional: Negative for fever, chills, activity change, appetite change and fatigue.  HENT: Negative.   Respiratory: Negative for chest tightness, shortness of breath and wheezing.   Cardiovascular: Positive for leg swelling. Negative for chest pain.  Gastrointestinal: Negative for nausea, vomiting, abdominal pain and abdominal distention.  Musculoskeletal:       "Knot in right groin"  Skin: Negative.     Vital Signs: BP 184/72 mmHg  Pulse 89  Temp(Src) 98.4 F (36.9 C) (Oral)  Resp 16  Ht 5\' 2"  (1.575 m)  Wt 180 lb 3.2 oz (81.738 kg)  BMI 32.95 kg/m2  SpO2 98%  Physical Exam  Constitutional: She is oriented to person, place, and time. She appears well-developed and well-nourished.  HENT:  Head: Normocephalic and atraumatic.  Eyes: EOM are normal.  Neck: Normal range of motion. Neck supple.  Cardiovascular: Normal rate, regular rhythm and normal heart sounds.   Pulmonary/Chest: Effort normal and breath sounds normal.  Abdominal: Soft. Bowel sounds are normal. She exhibits no distension. There is no  tenderness.  Musculoskeletal: Normal range of motion.  Large palpable lymph node right groin  Neurological: She is alert and oriented to person, place, and time.  Skin: Skin is warm and dry.  Psychiatric: She has a normal mood and affect. Her behavior is normal. Judgment and thought content normal.  Vitals reviewed.   Mallampati Score:  MD Evaluation Airway: WNL Heart: WNL Abdomen: WNL Chest/ Lungs: WNL ASA  Classification: 3 Mallampati/Airway Score: Two  Imaging: Ct Abdomen Pelvis W Contrast  02/12/2015  CLINICAL DATA:  Back and flank pain, left leg swelling/ pain, palpable abnormality in right groin, history of  uterine cancer status post XRT EXAM: CT ABDOMEN AND PELVIS WITH CONTRAST TECHNIQUE: Multidetector CT imaging of the abdomen and pelvis was performed using the standard protocol following bolus administration of intravenous contrast. CONTRAST:  175mL OMNIPAQUE IOHEXOL 300 MG/ML  SOLN COMPARISON:  12/06/2009 FINDINGS: Lower chest: 2.4 x 1.9 cm left lower lobe nodule (series 6/ image 3), suspicious for metastasis. Additional subcentimeter nodules in the visualized lung bases. Hepatobiliary: Liver is within normal limits. No suspicious/enhancing hepatic lesions. Gallbladder is unremarkable. No intrahepatic or extrahepatic ductal dilatation. Pancreas: Within normal limits. Spleen: Within normal limits. Adrenals/Urinary Tract: Adrenal glands are within normal limits. 5.8 cm cyst in the lateral right lower kidney (series 2/ image 35). Additional 10 mm lateral right lower pole renal cyst (series 2/image 28). 6 mm nonobstructing right upper pole renal calculus (series 2/ image 26). No hydronephrosis. Left kidney is within normal limits. Bladder is within normal limits. Stomach/Bowel: Stomach is notable for a small hiatal hernia. No evidence of bowel obstruction. Normal appendix (series 2/image 55). Vascular/Lymphatic: No evidence of abdominal aortic aneurysm. Thrombus within the left external iliac vein (series 2/image 56) extending into the left common femoral vein (series 2/ image 68) and distally into the left lower extremity (series 2/image 81), likely secondary to extrinsic compression by pelvic tumor. Retroperitoneal/pelvic nodal metastases, including: --1.8 cm short axis left para-aortic node (series 2/ image 29) --1.0 cm short axis inferior aortocaval node (series 2/image 39) --2.7 cm short axis necrotic left external iliac node (series 2/image 63) --8.5 x 7.1 cm necrotic right inguinal node (series 2/image 33), corresponding the palpable abnormality Reproductive: Status post hysterectomy. Bilateral ovaries are  favored to be surgically absent. Other: No abdominopelvic ascites. Tiny fat containing periumbilical hernia (series 2/ image 86). Musculoskeletal: 4.7 x 5.2 x 6.1 cm low-density lesion involving the left psoas muscle (series 2/ image 47), favored to reflect tumor, less likely infection. Associated sclerosis involving the left aspect of the L4 and L5 vertebral bodies (series 2/ images 43 and 47), favored to reflect osseous metastases, less likely infection. IMPRESSION: Status post hysterectomy. 8.5 x 7.1 cm necrotic right inguinal node, corresponding to the palpable abnormality, suspicious for nodal metastasis. Additional retroperitoneal/ left pelvic nodal metastases, as above. 6.1 cm low-density lesion in the left psoas muscle, favored to reflect tumor, less likely infection. Secondary involvement of the left L4 and L5 vertebral bodies. Associated acute deep venous thrombosis involving the left external iliac vein and extending into the left lower extremity, likely secondary to extrinsic compression by pelvic tumor. Pulmonary nodules/metastases at the lung bases, measuring up to 2.4 cm in the left lower lobe. These results were called by telephone at the time of interpretation on 02/12/2015 at 9:37 pm to Dr. Lacretia Leigh, who verbally acknowledged these results. Electronically Signed   By: Julian Hy M.D.   On: 02/12/2015 21:39    Labs:  CBC:  Recent Labs  02/12/15 1615 02/13/15 0127  WBC 10.6* 13.9*  HGB 8.4* 7.6*  HCT 27.8* 25.2*  PLT 404* 379    COAGS:  Recent Labs  02/12/15 2305  INR 1.19  APTT 31    BMP:  Recent Labs  02/12/15 1615 02/13/15 0127  NA 139 138  K 3.1* 3.7  CL 101 102  CO2 26 25  GLUCOSE 106* 117*  BUN 14 11  CALCIUM 9.2 8.9  CREATININE 0.89 0.73  GFRNONAA >60 >60  GFRAA >60 >60    LIVER FUNCTION TESTS: No results for input(s): BILITOT, AST, ALT, ALKPHOS, PROT, ALBUMIN in the last 8760 hours.  TUMOR MARKERS: No results for input(s): AFPTM, CEA,  CA199, CHROMGRNA in the last 8760 hours.  Assessment and Plan:  History of GYN cancer  Now with large lymph node in the right groin concerning for metastatic disease. Also retroperitoneal node.  Dr. Pascal Lux has reviewed CT scan.  Will proceed with either US guided biopsy of the right groin node or CT guided biopsy of the retroperitoneal node.  Thank you for this interesting consult.  I greatly enjoyed meeting [name] and look forward to participating in their care.  A copy of this report was sent to the requesting provider on this date.  Electronically Signed: Murrell Redden PA-C 02/13/2015, 11:39 AM   I spent a total of 40 Minutes in face to face in clinical consultation, greater than 50% of which was counseling/coordinating care for image guided biopsy.

## 2015-02-13 NOTE — Consult Note (Signed)
Reason for Consult: History of IC clear cell ovarian cancer, IB uterine cancer with likely metastatic disease Referring Physician: Janece Canterbury, MD  [name] is an 60 y.o. female. 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  She subsequently was treated with 6 cycles of carboplatin/paclitaxel and radiation therapy.  She had a good response to treatment and was felt to have no evidence of disease.  She presents now with several weeks of left lower extremity swelling and a mass in her right groin.  She also complains of fatigue and weight loss.  She denies any changes in her bowel or bladder habits.       Pertinent Gynecological History:  Previous GYN Procedures: 2010--TAH/BSO/omentectomy  Last mammogram: normal Date: 2014 Last pap: normal Date: 2012    Menstrual History:  No LMP recorded. Patient has had a hysterectomy.    Past Medical History  Diagnosis Date  . History of radiation therapy 10/21/08-11/23/08 & 12/07/10/13/12/23 2010    ENDOMETRIOID   . Uterine cancer (HCC)     ENDOMETRIAL CA& OVARY CANCER  . Hypertension     Past Surgical History  Procedure Laterality Date  . Tubal ligation      Family History  Problem Relation Age of Onset  . Bone cancer Paternal Grandmother     OSSEOUS METASTASIS    Social History:  reports that she has never smoked. She does not have any smokeless tobacco history on file. She reports that she does not drink alcohol or use illicit drugs.  Allergies: No Known Allergies  Medications:  Current facility-administered medications:  .  0.9 %  sodium chloride infusion, 250 mL, Intravenous, PRN, Theressa Millard, MD .  acetaminophen (TYLENOL) tablet 650 mg, 650 mg, Oral, Q6H PRN **OR** acetaminophen (TYLENOL) suppository 650 mg, 650 mg, Rectal, Q6H PRN,  Theressa Millard, MD .  alum & mag hydroxide-simeth (MAALOX/MYLANTA) 200-200-20 MG/5ML suspension 30 mL, 30 mL, Oral, Q6H PRN, Theressa Millard, MD .  cloNIDine (CATAPRES) tablet 0.1 mg, 0.1 mg, Oral, TID, Janece Canterbury, MD, 0.1 mg at 02/13/15 1545 .  diphenhydrAMINE (BENADRYL) capsule 25 mg, 25 mg, Oral, Q6H PRN, Janece Canterbury, MD, 25 mg at 02/13/15 1456 .  feeding supplement (ENSURE ENLIVE) (ENSURE ENLIVE) liquid 237 mL, 237 mL, Oral, BID BM, Janece Canterbury, MD, 237 mL at 02/13/15 1406 .  [COMPLETED] heparin bolus via infusion 4,000 Units, 4,000 Units, Intravenous, Once, 4,000 Units at 02/12/15 2324 **FOLLOWED BY** heparin ADULT infusion 100 units/mL (25000 units/250 mL), 1,400 Units/hr, Intravenous, Continuous, Janece Canterbury, MD, Last Rate: 14 mL/hr at 02/13/15 1625, 1,400 Units/hr at 02/13/15 1625 .  hydrALAZINE (APRESOLINE) injection 10 mg, 10 mg, Intravenous, Q4H PRN, Theressa Millard, MD, 10 mg at 02/13/15 1042 .  hydrochlorothiazide (HYDRODIURIL) tablet 25 mg, 25 mg, Oral, Daily, Theressa Millard, MD, 25 mg at 02/13/15 0827 .  HYDROmorphone (DILAUDID) injection 0.5-1 mg, 0.5-1 mg, Intravenous, Q3H PRN, Janece Canterbury, MD, 1 mg at 02/13/15 1456 .  labetalol (NORMODYNE) tablet 100 mg, 100 mg, Oral, TID, Janece Canterbury, MD, 100 mg at 02/13/15 1545 .  ondansetron (ZOFRAN) tablet 4 mg, 4 mg, Oral, Q6H PRN **OR** ondansetron (ZOFRAN) injection 4 mg, 4 mg, Intravenous, Q6H PRN, Theressa Millard, MD, 4 mg at 02/13/15 1456 .  oxyCODONE (Oxy IR/ROXICODONE) immediate release tablet 5-10 mg, 5-10 mg, Oral, Q4H  PRN, Janece Canterbury, MD .  sodium chloride flush (NS) 0.9 % injection 3 mL, 3 mL, Intravenous, Q12H, Theressa Millard, MD, 3 mL at 02/13/15 1000 .  sodium chloride flush (NS) 0.9 % injection 3 mL, 3 mL, Intravenous, Q12H, Theressa Millard, MD, 3 mL at 02/13/15 1000 .  sodium chloride flush (NS) 0.9 % injection 3 mL, 3 mL, Intravenous, PRN, Theressa Millard, MD .  zolpidem  (AMBIEN) tablet 5 mg, 5 mg, Oral, QHS PRN, Dianne Dun, NP, 5 mg at 02/13/15 0123  Review of Systems  Constitutional: Positive for weight loss and malaise/fatigue.  Respiratory: Negative for shortness of breath.   Gastrointestinal: Negative for nausea, vomiting, abdominal pain and constipation.    Blood pressure 158/67, pulse 88, temperature 98.3 F (36.8 C), temperature source Other (Comment), resp. rate 16, height _0  (1.575 m), weight 180 lb 3.2 oz (81.738 kg), SpO2 98 %. Physical Exam  Constitutional: She appears well-developed.  HENT:  Head: Normocephalic.  Cardiovascular: Normal rate, regular rhythm and normal heart sounds.   Respiratory: Effort normal and breath sounds normal.  GI: Soft. She exhibits no distension. There is no tenderness.  Musculoskeletal: Edema: LLE.  Lymphadenopathy:       Right: Inguinal (large- 5 x 3 cm) adenopathy present.    Results for orders placed or performed during the hospital encounter of 02/12/15 (from the past 48 hour(s))  CBC     Status: Abnormal   Collection Time: 02/12/15  4:15 PM  Result Value Ref Range   WBC 10.6 (H) 4.0 - 10.5 K/uL   RBC 3.95 3.87 - 5.11 MIL/uL   Hemoglobin 8.4 (L) 12.0 - 15.0 g/dL   HCT 27.8 (L) 36.0 - 46.0 %   MCV 70.4 (L) 78.0 - 100.0 fL   MCH 21.3 (L) 26.0 - 34.0 pg   MCHC 30.2 30.0 - 36.0 g/dL   RDW 19.7 (H) 11.5 - 15.5 %   Platelets 404 (H) 150 - 400 K/uL  Basic metabolic panel     Status: Abnormal   Collection Time: 02/12/15  4:15 PM  Result Value Ref Range   Sodium 139 135 - 145 mmol/L   Potassium 3.1 (L) 3.5 - 5.1 mmol/L   Chloride 101 101 - 111 mmol/L   CO2 26 22 - 32 mmol/L   Glucose, Bld 106 (H) 65 - 99 mg/dL   BUN 14 6 - 20 mg/dL   Creatinine, Ser 0.89 0.44 - 1.00 mg/dL   Calcium 9.2 8.9 - 10.3 mg/dL   GFR calc non Af Amer >60 >60 mL/min   GFR calc Af Amer >60 >60 mL/min    Comment: (NOTE) The eGFR has been calculated using the CKD EPI equation. This calculation has not been  validated in all clinical situations. eGFR's persistently <60 mL/min signify possible Chronic Kidney Disease.    Anion gap 12 5 - 15  POC occult blood, ED RN will collect     Status: None   Collection Time: 02/12/15  7:48 PM  Result Value Ref Range   Fecal Occult Bld NEGATIVE NEGATIVE  APTT     Status: None   Collection Time: 02/12/15 11:05 PM  Result Value Ref Range   aPTT 31 24 - 37 seconds  Protime-INR     Status: None   Collection Time: 02/12/15 11:05 PM  Result Value Ref Range   Prothrombin Time 15.2 11.6 - 15.2 seconds   INR 1.19 0.00 - 1.49  Heparin level (unfractionated)  Status: Abnormal   Collection Time: 02/12/15 11:13 PM  Result Value Ref Range   Heparin Unfractionated <0.10 (L) 0.30 - 0.70 IU/mL    Comment:        IF HEPARIN RESULTS ARE BELOW EXPECTED VALUES, AND PATIENT DOSAGE HAS BEEN CONFIRMED, SUGGEST FOLLOW UP TESTING OF ANTITHROMBIN III LEVELS.   Magnesium     Status: None   Collection Time: 02/13/15  1:27 AM  Result Value Ref Range   Magnesium 1.9 1.7 - 2.4 mg/dL  Basic metabolic panel     Status: Abnormal   Collection Time: 02/13/15  1:27 AM  Result Value Ref Range   Sodium 138 135 - 145 mmol/L   Potassium 3.7 3.5 - 5.1 mmol/L   Chloride 102 101 - 111 mmol/L   CO2 25 22 - 32 mmol/L   Glucose, Bld 117 (H) 65 - 99 mg/dL   BUN 11 6 - 20 mg/dL   Creatinine, Ser 0.73 0.44 - 1.00 mg/dL   Calcium 8.9 8.9 - 10.3 mg/dL   GFR calc non Af Amer >60 >60 mL/min   GFR calc Af Amer >60 >60 mL/min    Comment: (NOTE) The eGFR has been calculated using the CKD EPI equation. This calculation has not been validated in all clinical situations. eGFR's persistently <60 mL/min signify possible Chronic Kidney Disease.    Anion gap 11 5 - 15  CBC     Status: Abnormal   Collection Time: 02/13/15  1:27 AM  Result Value Ref Range   WBC 13.9 (H) 4.0 - 10.5 K/uL   RBC 3.57 (L) 3.87 - 5.11 MIL/uL   Hemoglobin 7.6 (L) 12.0 - 15.0 g/dL   HCT 25.2 (L) 36.0 - 46.0 %    MCV 70.6 (L) 78.0 - 100.0 fL   MCH 21.3 (L) 26.0 - 34.0 pg   MCHC 30.2 30.0 - 36.0 g/dL   RDW 19.7 (H) 11.5 - 15.5 %   Platelets 379 150 - 400 K/uL  Heparin level (unfractionated)     Status: Abnormal   Collection Time: 02/13/15  6:58 AM  Result Value Ref Range   Heparin Unfractionated 0.20 (L) 0.30 - 0.70 IU/mL    Comment:        IF HEPARIN RESULTS ARE BELOW EXPECTED VALUES, AND PATIENT DOSAGE HAS BEEN CONFIRMED, SUGGEST FOLLOW UP TESTING OF ANTITHROMBIN III LEVELS.   Heparin level (unfractionated)     Status: None   Collection Time: 02/13/15  2:15 PM  Result Value Ref Range   Heparin Unfractionated 0.31 0.30 - 0.70 IU/mL    Comment:        IF HEPARIN RESULTS ARE BELOW EXPECTED VALUES, AND PATIENT DOSAGE HAS BEEN CONFIRMED, SUGGEST FOLLOW UP TESTING OF ANTITHROMBIN III LEVELS.   TSH     Status: None   Collection Time: 02/13/15  2:15 PM  Result Value Ref Range   TSH 1.670 0.350 - 4.500 uIU/mL    Ct Abdomen Pelvis W Contrast  02/12/2015  CLINICAL DATA:  Back and flank pain, left leg swelling/ pain, palpable abnormality in right groin, history of uterine cancer status post XRT EXAM: CT ABDOMEN AND PELVIS WITH CONTRAST TECHNIQUE: Multidetector CT imaging of the abdomen and pelvis was performed using the standard protocol following bolus administration of intravenous contrast. CONTRAST:  147m OMNIPAQUE IOHEXOL 300 MG/ML  SOLN COMPARISON:  12/06/2009 FINDINGS: Lower chest: 2.4 x 1.9 cm left lower lobe nodule (series 6/ image 3), suspicious for metastasis. Additional subcentimeter nodules in the visualized lung bases. Hepatobiliary:  Liver is within normal limits. No suspicious/enhancing hepatic lesions. Gallbladder is unremarkable. No intrahepatic or extrahepatic ductal dilatation. Pancreas: Within normal limits. Spleen: Within normal limits. Adrenals/Urinary Tract: Adrenal glands are within normal limits. 5.8 cm cyst in the lateral right lower kidney (series 2/ image 35). Additional 10  mm lateral right lower pole renal cyst (series 2/image 28). 6 mm nonobstructing right upper pole renal calculus (series 2/ image 26). No hydronephrosis. Left kidney is within normal limits. Bladder is within normal limits. Stomach/Bowel: Stomach is notable for a small hiatal hernia. No evidence of bowel obstruction. Normal appendix (series 2/image 55). Vascular/Lymphatic: No evidence of abdominal aortic aneurysm. Thrombus within the left external iliac vein (series 2/image 56) extending into the left common femoral vein (series 2/ image 68) and distally into the left lower extremity (series 2/image 81), likely secondary to extrinsic compression by pelvic tumor. Retroperitoneal/pelvic nodal metastases, including: --1.8 cm short axis left para-aortic node (series 2/ image 29) --1.0 cm short axis inferior aortocaval node (series 2/image 39) --2.7 cm short axis necrotic left external iliac node (series 2/image 63) --8.5 x 7.1 cm necrotic right inguinal node (series 2/image 33), corresponding the palpable abnormality Reproductive: Status post hysterectomy. Bilateral ovaries are favored to be surgically absent. Other: No abdominopelvic ascites. Tiny fat containing periumbilical hernia (series 2/ image 32). Musculoskeletal: 4.7 x 5.2 x 6.1 cm low-density lesion involving the left psoas muscle (series 2/ image 47), favored to reflect tumor, less likely infection. Associated sclerosis involving the left aspect of the L4 and L5 vertebral bodies (series 2/ images 43 and 47), favored to reflect osseous metastases, less likely infection. IMPRESSION: Status post hysterectomy. 8.5 x 7.1 cm necrotic right inguinal node, corresponding to the palpable abnormality, suspicious for nodal metastasis. Additional retroperitoneal/ left pelvic nodal metastases, as above. 6.1 cm low-density lesion in the left psoas muscle, favored to reflect tumor, less likely infection. Secondary involvement of the left L4 and L5 vertebral bodies. Associated  acute deep venous thrombosis involving the left external iliac vein and extending into the left lower extremity, likely secondary to extrinsic compression by pelvic tumor. Pulmonary nodules/metastases at the lung bases, measuring up to 2.4 cm in the left lower lobe. These results were called by telephone at the time of interpretation on 02/12/2015 at 9:37 pm to Dr. Lacretia Leigh, who verbally acknowledged these results. Electronically Signed   By: Julian Hy M.D.   On: 02/12/2015 21:39    Assessment/Plan: History of synchronous ovarian clear cell EOC and endometrial carcinoma with imaging worrisome for metastatic disease Left external iliac DVT secondary to tumor compression Severe hypochromic,microcytic anemia  >CA-125 >agree with U/S-guided FNA of the right groin node >will likely be a candidate for chemotherapy/radiation >will follow with you   JACKSON-MOORE,Kimmerly Lora A 02/13/2015

## 2015-02-13 NOTE — Progress Notes (Addendum)
ANTICOAGULATION CONSULT NOTE - Follow-up Consult  Pharmacy Consult for Heparin Indication: DVT  No Known Allergies  Patient Measurements: Height: 5\' 2"  (157.5 cm) Weight: 180 lb 3.2 oz (81.738 kg) IBW/kg (Calculated) : 50.1 Heparin Dosing Weight: 71 kg  Vital Signs: Temp: 98.4 F (36.9 C) (02/12 0500) Temp Source: Oral (02/12 0500) BP: 192/98 mmHg (02/12 0500) Pulse Rate: 89 (02/12 0500)  Labs:  Recent Labs  02/12/15 1615 02/12/15 2305 02/12/15 2313 02/13/15 0127 02/13/15 0658  HGB 8.4*  --   --  7.6*  --   HCT 27.8*  --   --  25.2*  --   PLT 404*  --   --  379  --   APTT  --  31  --   --   --   LABPROT  --  15.2  --   --   --   INR  --  1.19  --   --   --   HEPARINUNFRC  --   --  <0.10*  --  0.20*  CREATININE 0.89  --   --  0.73  --     Estimated Creatinine Clearance: 73.1 mL/min (by C-G formula based on Cr of 0.73).   Medical History: Past Medical History  Diagnosis Date  . History of radiation therapy 10/21/08-11/23/08 & 12/07/10/13/12/23 2010    ENDOMETRIOID   . Uterine cancer (HCC)     ENDOMETRIAL CA& OVARY CANCER  . Hypertension     Medications:  Scheduled:  . cloNIDine  0.1 mg Oral TID  . feeding supplement (ENSURE ENLIVE)  237 mL Oral BID BM  . hydrochlorothiazide  25 mg Oral Daily  . sodium chloride flush  3 mL Intravenous Q12H  . sodium chloride flush  3 mL Intravenous Q12H   Infusions:  . heparin 1,200 Units/hr (02/12/15 2321)    Assessment:  60 yr female with h/o uterine cancer presents with left leg swelling/pain and knot to groin  Abdominal CT shows evidence of recurrence of neoplastic disease and acute DVT of LLE.   Pt not on oral anticoagulation PTA  2/12:  -Heparin infusion at 1200 units/hr (~17 units/kg/hr) -Heparin level this morning rising but still subtherapeutic @ 0.20 (goal 0.3-0.7).  -CBC: Hgb low and continues trending down, today 7.6, plts WNL.  No bleeding noted per RN.    -Renal fxn stable, CrCl >30   Goal of  Therapy:  Heparin level 0.3-0.7 units/ml Monitor platelets by anticoagulation protocol: Yes   Plan:   No bolus due to low hemoglobin  Increase heparin infusion to 1350 units/hr = 13.5 ml/hr  Recheck heparin level in 6 hours  Follow heparin level & CBC daily   Ralene Bathe, PharmD, BCPS 02/13/2015, 8:11 AM  Pager: [phone]   2/12 PM update: Noted plans for possible biopsy 2/13 or 2/14 with apixaban post procedure Heparin level this afternoon is therapeutic @ 0.31 though near low end of range.  Increase heparin gtt to 1400 units/hr and recheck in 6 hours.  No issues per RN.  Ralene Bathe, PharmD, BCPS 02/13/2015, 4:16 PM  Pager: [phone]

## 2015-02-13 NOTE — Progress Notes (Signed)
[facility] PROGRESS NOTE  [name] [id number] DOB: 1955/07/01 DOA: 02/12/2015 PCP: No primary care provider on file.  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 She presented with a three-day history of increasing left lower extremity swelling and pain and a three-month history of a large knot in the right groin area. In the emergency department, she was found to have an acute DVT of the left lower extremity, and multiple enlarged lymph nodes, numerous pulmonary nodules up to 2.4 cm, and a left psoas muscle mass concerning for recurrent cancer.  She was placed on a heparin drip and admitted for further evaluation.  Assessment/Plan  Acute left lower extremity DVT - Continue heparin drip -  Okay to briefly discontinue heparin drip after she has been on anticoagulation for at least 24-48 hours for procedure -  Plan to start apixaban post-procedure  Enlarged lymph nodes, pulmonary nodules, and left psoas mass concerning for recurrence of malignancy -  Discussed case with gynecology-oncology who will see the patient in consultation -  Interventional radiology consult for possible biopsy tomorrow or Tuesday  Hypokalemia, resolved with oral potassium supplementation  Essential hypertension with uncontrolled blood pressures over [id number] systolic - Increase clonidine to 3 times a day -  Add labetalol TID -  Continue HCTZ -  Continue when necessary hydralazine  Microcytic anemia, I suspect a combination of iron deficiency and anemia of chronic inflammation - Occult stool negative -  Iron studies, B12, folate -  TSH -  Repeat hgb in AM -  Transfuse to keep hemoglobin greater than 8 mg/dL prior to procedure  Diet:  Regular, then nothing by mouth at  midnight Access:  PIV IVF:  Off Proph:  Heparin drip  Code Status: Full code Family Communication: Patient and her husband Disposition Plan: Possibly home later tomorrow after biopsy or Tuesday if concerns for post procedure bleeding   Consultants:  GYN-ONC  IR  Procedures:  CT abd/pelvis  Antibiotics:  none   HPI/Subjective:  Continues to have pain in her left lower extremity in the anterior hip area, but calf area is less painful today.  Denies chest pain, pleuritic chest pain, shortness of breath, cough, abdominal pain. Tolerating diet. Denies constipation or difficulty urinating.  Objective: Filed Vitals:   02/13/15 0021 02/13/15 0500 02/13/15 0827 02/13/15 1028  BP: 212/88 192/98 193/86 184/72  Pulse: 110 89    Temp: 98.6 F (37 C) 98.4 F (36.9 C)    TempSrc: Oral Oral    Resp: 16 16    Height: 5\' 2"  (1.575 m)     Weight: 81.738 kg (180 lb 3.2 oz)     SpO2: 98% 98%      Intake/Output Summary (Last 24 hours) at 02/13/15 1341 Last data filed at 02/13/15 0700  Gross per 24 hour  Intake   91.8 ml  Output      0 ml  Net   91.8 ml   Filed Weights   02/12/15 2243 02/13/15 0021  Weight: 92.987 kg (205 lb) 81.738 kg (180 lb 3.2 oz)   Body mass index is 32.95 kg/(m^2).  Exam:   General:  Adult female, No acute distress  HEENT:  NCAT, MMM  Cardiovascular:  RRR, nl S1, S2 no mrg, 2+ pulses, warm extremities  Respiratory:  CTAB, no increased  WOB  Abdomen:   NABS, soft, NT/ND  MSK:   Normal tone and bulk, 2+ left lower extremity edema, large palpable nodule in the right groin area mildly tender to palpation, no surrounding or overlying erythema or induration  Neuro:  Grossly intact  Data Reviewed: Basic Metabolic Panel:  Recent Labs Lab 02/12/15 1615 02/13/15 0127  NA 139 138  K 3.1* 3.7  CL 101 102  CO2 26 25  GLUCOSE 106* 117*  BUN 14 11  CREATININE 0.89 0.73  CALCIUM 9.2 8.9  MG  --  1.9   Liver Function Tests: No results for  input(s): AST, ALT, ALKPHOS, BILITOT, PROT, ALBUMIN in the last 168 hours. No results for input(s): LIPASE, AMYLASE in the last 168 hours. No results for input(s): AMMONIA in the last 168 hours. CBC:  Recent Labs Lab 02/12/15 1615 02/13/15 0127  WBC 10.6* 13.9*  HGB 8.4* 7.6*  HCT 27.8* 25.2*  MCV 70.4* 70.6*  PLT 404* 379    No results found for this or any previous visit (from the past 240 hour(s)).   Studies: Ct Abdomen Pelvis W Contrast  02/12/2015  CLINICAL DATA:  Back and flank pain, left leg swelling/ pain, palpable abnormality in right groin, history of uterine cancer status post XRT EXAM: CT ABDOMEN AND PELVIS WITH CONTRAST TECHNIQUE: Multidetector CT imaging of the abdomen and pelvis was performed using the standard protocol following bolus administration of intravenous contrast. CONTRAST:  171mL OMNIPAQUE IOHEXOL 300 MG/ML  SOLN COMPARISON:  12/06/2009 FINDINGS: Lower chest: 2.4 x 1.9 cm left lower lobe nodule (series 6/ image 3), suspicious for metastasis. Additional subcentimeter nodules in the visualized lung bases. Hepatobiliary: Liver is within normal limits. No suspicious/enhancing hepatic lesions. Gallbladder is unremarkable. No intrahepatic or extrahepatic ductal dilatation. Pancreas: Within normal limits. Spleen: Within normal limits. Adrenals/Urinary Tract: Adrenal glands are within normal limits. 5.8 cm cyst in the lateral right lower kidney (series 2/ image 35). Additional 10 mm lateral right lower pole renal cyst (series 2/image 28). 6 mm nonobstructing right upper pole renal calculus (series 2/ image 26). No hydronephrosis. Left kidney is within normal limits. Bladder is within normal limits. Stomach/Bowel: Stomach is notable for a small hiatal hernia. No evidence of bowel obstruction. Normal appendix (series 2/image 55). Vascular/Lymphatic: No evidence of abdominal aortic aneurysm. Thrombus within the left external iliac vein (series 2/image 56) extending into the left  common femoral vein (series 2/ image 68) and distally into the left lower extremity (series 2/image 81), likely secondary to extrinsic compression by pelvic tumor. Retroperitoneal/pelvic nodal metastases, including: --1.8 cm [name] axis left para-aortic node (series 2/ image 29) --1.0 cm Saahir Prude axis inferior aortocaval node (series 2/image 39) --2.7 cm Gor Vestal axis necrotic left external iliac node (series 2/image 63) --8.5 x 7.1 cm necrotic right inguinal node (series 2/image 33), corresponding the palpable abnormality Reproductive: Status post hysterectomy. Bilateral ovaries are favored to be surgically absent. Other: No abdominopelvic ascites. Tiny fat containing periumbilical hernia (series 2/ image 45). Musculoskeletal: 4.7 x 5.2 x 6.1 cm low-density lesion involving the left psoas muscle (series 2/ image 47), favored to reflect tumor, less likely infection. Associated sclerosis involving the left aspect of the L4 and L5 vertebral bodies (series 2/ images 43 and 47), favored to reflect osseous metastases, less likely infection. IMPRESSION: Status post hysterectomy. 8.5 x 7.1 cm necrotic right inguinal node, corresponding to the palpable abnormality, suspicious for nodal metastasis. Additional retroperitoneal/ left pelvic nodal metastases, as above. 6.1 cm low-density lesion in the  left psoas muscle, favored to reflect tumor, less likely infection. Secondary involvement of the left L4 and L5 vertebral bodies. Associated acute deep venous thrombosis involving the left external iliac vein and extending into the left lower extremity, likely secondary to extrinsic compression by pelvic tumor. Pulmonary nodules/metastases at the lung bases, measuring up to 2.4 cm in the left lower lobe. These results were called by telephone at the time of interpretation on 02/12/2015 at 9:37 pm to Dr. Lacretia Leigh, who verbally acknowledged these results. Electronically Signed   By: Julian Hy M.D.   On: 02/12/2015 21:39     Scheduled Meds: . cloNIDine  0.1 mg Oral TID  . feeding supplement (ENSURE ENLIVE)  237 mL Oral BID BM  . hydrochlorothiazide  25 mg Oral Daily  . sodium chloride flush  3 mL Intravenous Q12H  . sodium chloride flush  3 mL Intravenous Q12H   Continuous Infusions: . heparin 1,350 Units/hr (02/13/15 [id number])    Principal Problem:   DVT (deep venous thrombosis), left Active Problems:   Essential hypertension   Uterine cancer (HCC)   Hypokalemia   Acute deep vein thrombosis (DVT) ([place])    Time spent: 30 min    Sharmaine Bain, [facility] Pager [phone]. If 7PM-7AM, please contact night-coverage at [email or web address], password [facility] 02/13/2015, 1:41 PM  LOS: 1 day

## 2015-02-13 NOTE — Progress Notes (Signed)
ANTICOAGULATION CONSULT NOTE - Follow-up Consult  Pharmacy Consult for Heparin Indication: DVT  No Known Allergies  Patient Measurements: Height: 5\' 2"  (157.5 cm) Weight: 180 lb 3.2 oz (81.738 kg) IBW/kg (Calculated) : 50.1 Heparin Dosing Weight: 71 kg  Vital Signs: Temp: 98.6 F (37 C) (02/12 2249) Temp Source: Oral (02/12 2249) BP: 150/58 mmHg (02/12 2249) Pulse Rate: 77 (02/12 2249)  Labs:  Recent Labs  02/12/15 1615 02/12/15 2305  02/13/15 0127 02/13/15 0658 02/13/15 1415 02/13/15 2236  HGB 8.4*  --   --  7.6*  --   --   --   HCT 27.8*  --   --  25.2*  --   --   --   PLT 404*  --   --  379  --   --   --   APTT  --  31  --   --   --   --   --   LABPROT  --  15.2  --   --   --   --   --   INR  --  1.19  --   --   --   --   --   HEPARINUNFRC  --   --   < >  --  0.20* 0.31 0.46  CREATININE 0.89  --   --  0.73  --   --   --   < > = values in this interval not displayed.  Estimated Creatinine Clearance: 73.1 mL/min (by C-G formula based on Cr of 0.73).   Medical History: Past Medical History  Diagnosis Date  . History of radiation therapy 10/21/08-11/23/08 & 12/07/10/13/12/23 2010    ENDOMETRIOID   . Uterine cancer (HCC)     ENDOMETRIAL CA& OVARY CANCER  . Hypertension     Medications:  Scheduled:  . cloNIDine  0.1 mg Oral TID  . feeding supplement (ENSURE ENLIVE)  237 mL Oral BID BM  . hydrochlorothiazide  25 mg Oral Daily  . labetalol  100 mg Oral TID  . sodium chloride flush  3 mL Intravenous Q12H  . sodium chloride flush  3 mL Intravenous Q12H   Infusions:  . heparin 1,400 Units/hr (02/13/15 1625)    Assessment:  60 yr female with h/o uterine cancer presents with left leg swelling/pain and knot to groin  Abdominal CT shows evidence of recurrence of neoplastic disease and acute DVT of LLE.   Pt not on oral anticoagulation PTA  2/12:  -Heparin infusion at 1400 units/hr with heparin level = 0.46 -CBC: Hgb low and continues trending down, today  7.6, plts WNL.   No complications of therapy noted  Goal of Therapy:  Heparin level 0.3-0.7 units/ml Monitor platelets by anticoagulation protocol: Yes   Plan:   Continue heparin infusion @ 1400 units/hr   Follow heparin level & CBC daily   Leone Haven, PharmD  02/13/2015, 11:20 PM

## 2015-02-14 ENCOUNTER — Inpatient Hospital Stay (HOSPITAL_COMMUNITY): Payer: Medicaid Other

## 2015-02-14 DIAGNOSIS — D638 Anemia in other chronic diseases classified elsewhere: Secondary | ICD-10-CM

## 2015-02-14 DIAGNOSIS — R590 Localized enlarged lymph nodes: Secondary | ICD-10-CM

## 2015-02-14 DIAGNOSIS — D509 Iron deficiency anemia, unspecified: Secondary | ICD-10-CM

## 2015-02-14 LAB — BASIC METABOLIC PANEL
Anion gap: 10 (ref 5–15)
BUN: 16 mg/dL (ref 6–20)
CALCIUM: 8.9 mg/dL (ref 8.9–10.3)
CO2: 26 mmol/L (ref 22–32)
CREATININE: 0.93 mg/dL (ref 0.44–1.00)
Chloride: 99 mmol/L — ABNORMAL LOW (ref 101–111)
GFR calc Af Amer: >60 mL/min (ref >60)
GFR calc non Af Amer: >60 mL/min (ref >60)
GLUCOSE: 116 mg/dL — AB (ref 65–99)
Potassium: 3.7 mmol/L (ref 3.5–5.1)
Sodium: 135 mmol/L (ref 135–145)

## 2015-02-14 LAB — HEPARIN LEVEL (UNFRACTIONATED): Heparin Unfractionated: 0.52 IU/mL (ref 0.30–0.70)

## 2015-02-14 MED ORDER — APIXABAN 5 MG PO TABS
10.0000 mg | ORAL_TABLET | Freq: Two times a day (BID) | ORAL | Status: DC
  Administered 2015-02-14 – 2015-02-15 (×2): 10 mg via ORAL
  Filled 2015-02-14 (×4): qty 2
  Filled 2015-02-14: qty 4

## 2015-02-14 MED ORDER — FENTANYL CITRATE (PF) 100 MCG/2ML IJ SOLN
INTRAMUSCULAR | Status: AC | PRN
  Administered 2015-02-14: 25 ug via INTRAVENOUS
  Administered 2015-02-14: 50 ug via INTRAVENOUS

## 2015-02-14 MED ORDER — APIXABAN 5 MG PO TABS: 5.0000 mg | ORAL_TABLET | Freq: Two times a day (BID) | ORAL | Status: DC

## 2015-02-14 MED ORDER — FENTANYL CITRATE (PF) 100 MCG/2ML IJ SOLN
INTRAMUSCULAR | Status: AC
  Filled 2015-02-14: qty 4

## 2015-02-14 MED ORDER — MORPHINE SULFATE 15 MG PO TABS
15.0000 mg | ORAL_TABLET | ORAL | Status: DC | PRN
  Administered 2015-02-14 – 2015-02-17 (×6): 15 mg via ORAL
  Filled 2015-02-14 (×6): qty 1

## 2015-02-14 MED ORDER — MIDAZOLAM HCL 2 MG/2ML IJ SOLN
INTRAMUSCULAR | Status: AC | PRN
  Administered 2015-02-14 (×2): 1 mg via INTRAVENOUS

## 2015-02-14 MED ORDER — MIDAZOLAM HCL 2 MG/2ML IJ SOLN
INTRAMUSCULAR | Status: AC
  Filled 2015-02-14: qty 6

## 2015-02-14 MED ORDER — FERROUS SULFATE 325 (65 FE) MG PO TABS
325.0000 mg | ORAL_TABLET | Freq: Two times a day (BID) | ORAL | Status: DC
Start: 2015-02-14 — End: 2015-02-17
  Administered 2015-02-14 – 2015-02-17 (×5): 325 mg via ORAL
  Filled 2015-02-14 (×5): qty 1

## 2015-02-14 NOTE — Procedures (Signed)
Interventional Radiology Procedure Note  Procedure:  Ultrasound guided aspirate and core biopsy of right inguinal lymph node  Complications:  None  Estimated Blood Loss: 15-20 mL  Aspiration of fluid component of right inguinal lymph node yielded 60 mL of brownish fluid. Additional 16 G core biopsy of solid component of node x 5 submitted in saline.  [name]. Kathlene Cote, M.D Pager:  [phone]

## 2015-02-14 NOTE — Sedation Documentation (Signed)
Pt resting with eyes closed in no acute distress.

## 2015-02-14 NOTE — Consult Note (Signed)
Consult Note: Gyn-Onc  Consult was requested by Dr. Sheran Fava for the evaluation of [name] 60 y.o. female with recurrent endometrial/ovarian cancer  CC:  Chief Complaint  Patient presents with  . Leg Swelling  . Knot on Leg     Assessment/Plan:  [name]  is a 60 y.o.  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 Symptomatic with LLE DVT and left back pain. Right groin lymphadenopathy is quite asymptomatic at present though impressive on exam.  I performed a history, physical examination, and personally reviewed the patient's imaging films including the films from the CT abdo/pelvis from 02/12/15.  Recommend confirmatory biopsy. Recommend chest CT to better delineate chest disease.  I discussed with the patient and her husband that this is most likely a recurrence of one of her gynecologic cancers. Due to the disseminated presentation, she is not a candidate for surgery. She requires systemic therapy (chemotherapy). Given her long interval since prior treatment, it is reasonable to retreat with carboplatin and paclitaxel. Goals of therapy will be palliative.  I have discussed her case with my colleague, Medical Oncologist Dr Evlyn Clines. We will present the patient at our [facility] to discuss if there is a potential role for radiation in palliating her symptoms (particularly from left psoas mass).  HPI: [name] is a 60 year old woman with a remote (2010) history of a TAH, BSO and omental biopsy performed by Dr Hulan Fray for presumed fibroids. Final pathology revealed a stage IC clear cell ovarian cancer (though lymphadenectomy was not performed, but washings were positive and there was cyst rupture). There was a synchronous stage I high grade endometrial cancer found.   She was seen by Dr Nancy Marus at [facility] and a plan was made for adjuvant therapy with carboplatin and paclitaxel chemotherapy (with Dr Marko Plume)  and external beam radiation (with Dr Sondra Come).  She completed therapy with no issues. She was NED at completion of treatment. She followed up for one year (2011) and was then lost to follow-up.   In December 2016 the patient reports noticing a right groin mass developing it was painless. In late January 2017 she began experiencing left lower extremity edema and pain and back and flank pain radiating down into her leg patient   She presented to the [facility] ER on 02/12/15 with symptoms of left leg pain. A DVT was disgnosed on the left and she was started on anticoagulation. CT scan of the abdo/pelvis was performed which revealed: 8.5 x 7.1 cm necrotic right inguinal node, corresponding to the palpable abnormality, suspicious for nodal metastasis. Additional retroperitoneal/ left pelvic nodal metastases, as above. 6.1 cm low-density lesion in the left psoas muscle, favored to reflect tumor, less likely infection. Secondary involvement of the left L4 and L5 vertebral bodies. Associated acute deep venous thrombosis involving the left external iliac vein and extending into the left lower extremity, likely secondary to extrinsic compression by pelvic tumor. Pulmonary nodules/metastases at the lung bases, measuring up to 2.4 cm in the left lower lobe.  Current Meds:  No current facility-administered medications on file prior to encounter.   Current Outpatient Prescriptions on File Prior to Encounter  Medication Sig Dispense Refill  . cloNIDine (CATAPRES) 0.1 MG tablet Take 0.1 mg by mouth at bedtime.      . hydrochlorothiazide (HYDRODIURIL) 25 MG tablet Take 25 mg by mouth daily.      . naproxen sodium (ANAPROX) 220 MG tablet  Take 440 mg by mouth daily as needed (pain).        Allergy: No Known Allergies  Social Hx:   Social History   Social History  . Marital Status: Married    Spouse Name: N/A  . Number of Children: N/A  . Years of Education: N/A   Occupational History  . Not on file.    Social History Main Topics  . Smoking status: Never Smoker   . Smokeless tobacco: Not on file  . Alcohol Use: No  . Drug Use: No  . Sexual Activity: Not on file   Other Topics Concern  . Not on file   Social History Narrative    Past Surgical Hx:  Past Surgical History  Procedure Laterality Date  . Tubal ligation      Past Medical Hx:  Past Medical History  Diagnosis Date  . History of radiation therapy 10/21/08-11/23/08 & 12/07/10/13/12/23 2010    ENDOMETRIOID   . Uterine cancer (HCC)     ENDOMETRIAL CA& OVARY CANCER  . Hypertension     Past Gynecological History:  Ovarian and endometrial cancer in 2010 No LMP recorded. Patient has had a hysterectomy.  Family Hx:  Family History  Problem Relation Age of Onset  . Bone cancer Paternal Grandmother     OSSEOUS METASTASIS    Review of Systems:  Constitutional  Feels well,    ENT Normal appearing ears and nares bilaterally Skin/Breast  No rash, sores, jaundice, itching, dryness Cardiovascular  No chest pain, shortness of breath, or edema  Pulmonary  No cough or wheeze.  Gastro Intestinal  No nausea, vomitting, or diarrhoea. No bright red blood per rectum, no abdominal pain, change in bowel movement, or constipation.  Genito Urinary  No frequency, urgency, dysuria, no bleeding Musculo Skeletal  + LE edema  Neurologic  + pain left back into left leg Psychology  No depression, anxiety, insomnia.   Vitals:  Blood pressure 156/63, pulse 83, temperature 98.4 F (36.9 C), temperature source Oral, resp. rate 20, height 5\' 2"  (1.575 m), weight 180 lb 3.2 oz (81.738 kg), SpO2 97 %.  Physical Exam: WD in NAD Neck  Supple NROM, without any enlargements.  Lymph Node Survey No cervical supraclavicular. Rt sided 6-8cm mass bulging in right groin. Nontender. Cardiovascular  Pulse normal rate, regularity and rhythm. S1 and S2 normal.  Lungs  Clear to auscultation bilateraly, without wheezes/crackles/rhonchi. Good  air movement.  Skin  No rash/lesions/breakdown  Psychiatry  Alert and oriented to person, place, and time  Abdomen  Normoactive bowel sounds, abdomen soft, non-tender and obese without evidence of hernia.  Back No CVA tenderness Genito Urinary  Vulva/vagina: deferred Rectal  Good tone, no masses no cul de sac nodularity.  Extremities  2+ edema LLE   Donaciano Eva, MD  02/14/2015, 9:47 AM

## 2015-02-14 NOTE — Progress Notes (Signed)
ANTICOAGULATION CONSULT NOTE - Follow-up Consult  Pharmacy Consult for Heparin Indication: DVT  No Known Allergies  Patient Measurements: Height: 5\' 2"  (157.5 cm) Weight: 180 lb 3.2 oz (81.738 kg) IBW/kg (Calculated) : 50.1 Heparin Dosing Weight: 71 kg  Vital Signs: Temp: 98.4 F (36.9 C) (02/13 0610) Temp Source: Oral (02/13 0610) BP: 156/63 mmHg (02/13 0610) Pulse Rate: 83 (02/13 0610)  Labs:  Recent Labs  02/12/15 1615 02/12/15 2305  02/13/15 0127  02/13/15 1415 02/13/15 2236 02/14/15 0556  HGB 8.4*  --   --  7.6*  --   --   --   --   HCT 27.8*  --   --  25.2*  --   --   --   --   PLT 404*  --   --  379  --   --   --   --   APTT  --  31  --   --   --   --   --   --   LABPROT  --  15.2  --   --   --   --   --   --   INR  --  1.19  --   --   --   --   --   --   HEPARINUNFRC  --   --   < >  --   < > 0.31 0.46 0.52  CREATININE 0.89  --   --  0.73  --   --   --  0.93  < > = values in this interval not displayed.  Estimated Creatinine Clearance: 62.9 mL/min (by C-G formula based on Cr of 0.93).   Medical History: Past Medical History  Diagnosis Date  . History of radiation therapy 10/21/08-11/23/08 & 12/07/10/13/12/23 2010    ENDOMETRIOID   . Uterine cancer (HCC)     ENDOMETRIAL CA& OVARY CANCER  . Hypertension     Medications:  Scheduled:  . cloNIDine  0.1 mg Oral TID  . feeding supplement (ENSURE ENLIVE)  237 mL Oral BID BM  . hydrochlorothiazide  25 mg Oral Daily  . labetalol  100 mg Oral TID  . sodium chloride flush  3 mL Intravenous Q12H  . sodium chloride flush  3 mL Intravenous Q12H   Infusions:  . heparin Stopped (02/14/15 0857)    Assessment:  60 yr female with h/o uterine cancer presents with left leg swelling/pain and knot to groin  Abdominal CT shows evidence of recurrence of neoplastic disease and acute DVT of LLE.   Pt not on oral anticoagulation PTA  Today, 02/14/2015:  Hgb trending down on 2/12; Plt wnl.  MD thinks dilutional /  chronic anemia, Ok to continue heparin.  No CBC this AM  Heparin level therapeutic this AM  0900 heparin turned off for Biopsy; per MD to transition to Eliquis after procedure  Goal of Therapy:  Heparin level 0.3-0.7 units/ml Monitor platelets by anticoagulation protocol: Yes   Plan:   Holding heparin  Await instructions for post-procedural anticoagulation  Would recheck CBC given recent drop in Hgb  Reuel Boom, PharmD, BCPS Pager: [phone] 02/14/2015, 11:52 AM

## 2015-02-14 NOTE — Progress Notes (Signed)
[facility] PROGRESS NOTE  [name] [id number] DOB: [birth year] DOA: 02/12/2015 PCP: No primary care provider on file.  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 She presented with a three-day history of increasing left lower extremity swelling and pain and a three-month history of a large knot in the right groin area. In the emergency department, she was found to have an acute DVT of the left lower extremity, and multiple enlarged lymph nodes, numerous pulmonary nodules up to 2.4 cm, and a left psoas muscle mass concerning for recurrent cancer.  She was placed on a heparin drip and admitted for further evaluation.  Assessment/Plan  Acute left lower extremity DVT -  Resume heparin drip this afternoon and then transition to request this evening -  Apply TED hose to left leg  Enlarged lymph nodes, pulmonary nodules, and left psoas mass concerning for recurrence of malignancy -  Appreciate gynecology-oncology assistance -  Appreciate Interventional radiology assistance with biopsy today  -  Follow-up pathology from right inguinal lymph node biopsy  Hypokalemia, resolved with oral potassium supplementation  Essential hypertension with uncontrolled blood pressures over [id number] systolic - Continue clonidine to 3 times a day -  Continue labetalol TID -  Continue HCTZ -  Continue when necessary hydralazine  Microcytic anemia, I suspect a combination of iron deficiency and anemia of chronic inflammation -  Repeat hgb in AM  Iron deficiency anemia and anemia of chronic disease, occult stool negative, B12, folate, TSH wnl -  Start iron supplementation  Diet:  Regular, then nothing by mouth at midnight Access:  PIV IVF:  Off Proph:  Heparin drip  Code  Status: Full code Family Communication: Patient and her husband Disposition Plan:   Likely discharge home tomorrow if tolerating anticoagulation with eliquis   Consultants:  GYN-ONC  IR  Procedures:  CT abd/pelvis  Antibiotics:  none   HPI/Subjective:  Continues to have pain in her left lower extremity in the anterior hip area.  Denies chest pain, shortness of breath.  Objective: Filed Vitals:   02/14/15 1326 02/14/15 1335 02/14/15 1400 02/14/15 1415  BP: 168/67 154/69 140/62 138/56  Pulse: 76 73 73 65  Temp:   98.1 F (36.7 C) 98.2 F (36.8 C)  TempSrc:   Axillary Oral  Resp: 18 18 18 18   Height:      Weight:      SpO2: 97% 98% 93% 95%    Intake/Output Summary (Last 24 hours) at 02/14/15 1438 Last data filed at 02/14/15 0940  Gross per 24 hour  Intake 689.24 ml  Output      0 ml  Net 689.24 ml   Filed Weights   02/12/15 2243 02/13/15 0021  Weight: 92.987 kg (205 lb) 81.738 kg (180 lb 3.2 oz)   Body mass index is 32.95 kg/(m^2).  Exam:   General:  Adult female, No acute distress  HEENT:  NCAT, MMM  Cardiovascular:  RRR, nl S1, S2 no mrg, 2+ pulses, warm extremities  Respiratory:  CTAB, no increased WOB  Abdomen:   NABS, soft, NT/ND  MSK:   Normal tone and bulk, 2+ left lower extremity edema, large palpable nodule in the right groin area mildly tender to palpation, no surrounding or overlying erythema or induration  Neuro:  Grossly intact  Data Reviewed: Basic  Metabolic Panel:  Recent Labs Lab 02/12/15 1615 02/13/15 0127 02/14/15 0556  NA 139 138 135  K 3.1* 3.7 3.7  CL 101 102 99*  CO2 26 25 26   GLUCOSE 106* 117* 116*  BUN 14 11 16   CREATININE 0.89 0.73 0.93  CALCIUM 9.2 8.9 8.9  MG  --  1.9  --    Liver Function Tests: No results for input(s): AST, ALT, ALKPHOS, BILITOT, PROT, ALBUMIN in the last 168 hours. No results for input(s): LIPASE, AMYLASE in the last 168 hours. No results for input(s): AMMONIA in the last 168  hours. CBC:  Recent Labs Lab 02/12/15 1615 02/13/15 0127  WBC 10.6* 13.9*  HGB 8.4* 7.6*  HCT 27.8* 25.2*  MCV 70.4* 70.6*  PLT 404* 379    No results found for this or any previous visit (from the past 240 hour(s)).   Studies: Ct Abdomen Pelvis W Contrast  02/12/2015  CLINICAL DATA:  Back and flank pain, left leg swelling/ pain, palpable abnormality in right groin, history of uterine cancer status post XRT EXAM: CT ABDOMEN AND PELVIS WITH CONTRAST TECHNIQUE: Multidetector CT imaging of the abdomen and pelvis was performed using the standard protocol following bolus administration of intravenous contrast. CONTRAST:  152mL OMNIPAQUE IOHEXOL 300 MG/ML  SOLN COMPARISON:  12/06/2009 FINDINGS: Lower chest: 2.4 x 1.9 cm left lower lobe nodule (series 6/ image 3), suspicious for metastasis. Additional subcentimeter nodules in the visualized lung bases. Hepatobiliary: Liver is within normal limits. No suspicious/enhancing hepatic lesions. Gallbladder is unremarkable. No intrahepatic or extrahepatic ductal dilatation. Pancreas: Within normal limits. Spleen: Within normal limits. Adrenals/Urinary Tract: Adrenal glands are within normal limits. 5.8 cm cyst in the lateral right lower kidney (series 2/ image 35). Additional 10 mm lateral right lower pole renal cyst (series 2/image 28). 6 mm nonobstructing right upper pole renal calculus (series 2/ image 26). No hydronephrosis. Left kidney is within normal limits. Bladder is within normal limits. Stomach/Bowel: Stomach is notable for a small hiatal hernia. No evidence of bowel obstruction. Normal appendix (series 2/image 55). Vascular/Lymphatic: No evidence of abdominal aortic aneurysm. Thrombus within the left external iliac vein (series 2/image 56) extending into the left common femoral vein (series 2/ image 68) and distally into the left lower extremity (series 2/image 81), likely secondary to extrinsic compression by pelvic tumor. Retroperitoneal/pelvic  nodal metastases, including: --1.8 cm Meagan Spease axis left para-aortic node (series 2/ image 29) --1.0 cm Marlea Gambill axis inferior aortocaval node (series 2/image 39) --2.7 cm Luv Mish axis necrotic left external iliac node (series 2/image 63) --8.5 x 7.1 cm necrotic right inguinal node (series 2/image 33), corresponding the palpable abnormality Reproductive: Status post hysterectomy. Bilateral ovaries are favored to be surgically absent. Other: No abdominopelvic ascites. Tiny fat containing periumbilical hernia (series 2/ image 68). Musculoskeletal: 4.7 x 5.2 x 6.1 cm low-density lesion involving the left psoas muscle (series 2/ image 47), favored to reflect tumor, less likely infection. Associated sclerosis involving the left aspect of the L4 and L5 vertebral bodies (series 2/ images 43 and 47), favored to reflect osseous metastases, less likely infection. IMPRESSION: Status post hysterectomy. 8.5 x 7.1 cm necrotic right inguinal node, corresponding to the palpable abnormality, suspicious for nodal metastasis. Additional retroperitoneal/ left pelvic nodal metastases, as above. 6.1 cm low-density lesion in the left psoas muscle, favored to reflect tumor, less likely infection. Secondary involvement of the left L4 and L5 vertebral bodies. Associated acute deep venous thrombosis involving the left external iliac vein and extending into the left  lower extremity, likely secondary to extrinsic compression by pelvic tumor. Pulmonary nodules/metastases at the lung bases, measuring up to 2.4 cm in the left lower lobe. These results were called by telephone at the time of interpretation on 02/12/2015 at 9:37 pm to Dr. Lacretia Leigh, who verbally acknowledged these results. Electronically Signed   By: Julian Hy M.D.   On: 02/12/2015 21:39    Scheduled Meds: . cloNIDine  0.1 mg Oral TID  . feeding supplement (ENSURE ENLIVE)  237 mL Oral BID BM  . hydrochlorothiazide  25 mg Oral Daily  . labetalol  100 mg Oral TID  . sodium  chloride flush  3 mL Intravenous Q12H  . sodium chloride flush  3 mL Intravenous Q12H   Continuous Infusions: . heparin 1,400 Units/hr (02/14/15 1347)    Principal Problem:   DVT (deep venous thrombosis), left Active Problems:   Essential hypertension   Uterine cancer (HCC)   Hypokalemia   Acute deep vein thrombosis (DVT) ([place])    Time spent: 30 min    Lenzy Kerschner, [facility] Pager [phone]. If 7PM-7AM, please contact night-coverage at [email or web address], password [facility] 02/14/2015, 2:38 PM  LOS: 2 days

## 2015-02-14 NOTE — Progress Notes (Signed)
ANTICOAGULATION CONSULT NOTE - Initial Consult  Pharmacy Consult for eliquis Indication: DVT  No Known Allergies  Patient Measurements: Height: 5\' 2"  (157.5 cm) Weight: 180 lb 3.2 oz (81.738 kg) IBW/kg (Calculated) : 50.1   Vital Signs: Temp: 97.7 F (36.5 C) (02/13 1500) Temp Source: Oral (02/13 1500) BP: 143/59 mmHg (02/13 1500) Pulse Rate: 65 (02/13 1500)  Labs:  Recent Labs  02/12/15 1615 02/12/15 2305  02/13/15 0127  02/13/15 1415 02/13/15 2236 02/14/15 0556  HGB 8.4*  --   --  7.6*  --   --   --   --   HCT 27.8*  --   --  25.2*  --   --   --   --   PLT 404*  --   --  379  --   --   --   --   APTT  --  31  --   --   --   --   --   --   LABPROT  --  15.2  --   --   --   --   --   --   INR  --  1.19  --   --   --   --   --   --   HEPARINUNFRC  --   --   < >  --   < > 0.31 0.46 0.52  CREATININE 0.89  --   --  0.73  --   --   --  0.93  < > = values in this interval not displayed.  Estimated Creatinine Clearance: 62.9 mL/min (by C-G formula based on Cr of 0.93).   Medical History: Past Medical History  Diagnosis Date  . History of radiation therapy 10/21/08-11/23/08 & 12/07/10/13/12/23 2010    ENDOMETRIOID   . Uterine cancer (HCC)     ENDOMETRIAL CA& OVARY CANCER  . Hypertension      Assessment:  60 yr female with h/o uterine cancer presents with left leg swelling/pain and knot to groin  Abdominal CT shows evidence of recurrence of neoplastic disease and acute DVT of LLE.   Pt initially started on heparin drip, therapeutic this am, turned off at 9am for biopsy  Transition to eliquis   Goal of Therapy:  eliquis per renal function and indication   Plan:  -Start eliquis 10mg  po twice daily for 7 days then take eliquis 5mg  twice daily thereafter. -pharmacy to education patient  Dolly Rias RPh 02/14/2015, 3:20 PM Pager [phone]

## 2015-02-14 NOTE — Progress Notes (Signed)
Pt setup with [employer] [facility] for medications and she understands that this is only once in one year use from today's date until 02/14/16. Pt has appointment at [facility] on 02/21/15 at 9 AM and can purchase medications there. Pt states, she understands and will keep her appointment.

## 2015-02-15 ENCOUNTER — Inpatient Hospital Stay (HOSPITAL_COMMUNITY): Payer: Medicaid Other

## 2015-02-15 ENCOUNTER — Encounter (HOSPITAL_COMMUNITY): Payer: Self-pay | Admitting: Radiology

## 2015-02-15 DIAGNOSIS — R918 Other nonspecific abnormal finding of lung field: Secondary | ICD-10-CM

## 2015-02-15 LAB — PREPARE RBC (CROSSMATCH)

## 2015-02-15 LAB — CBC
HEMATOCRIT: 22.6 % — AB (ref 36.0–46.0)
HEMOGLOBIN: 6.7 g/dL — AB (ref 12.0–15.0)
MCH: 21.1 pg — ABNORMAL LOW (ref 26.0–34.0)
MCHC: 29.6 g/dL — ABNORMAL LOW (ref 30.0–36.0)
MCV: 71.1 fL — ABNORMAL LOW (ref 78.0–100.0)
Platelets: 367 10*3/uL (ref 150–400)
RBC: 3.18 MIL/uL — ABNORMAL LOW (ref 3.87–5.11)
RDW: 20.1 % — AB (ref 11.5–15.5)
WBC: 9.5 10*3/uL (ref 4.0–10.5)

## 2015-02-15 LAB — BASIC METABOLIC PANEL
ANION GAP: 9 (ref 5–15)
BUN: 15 mg/dL (ref 6–20)
CALCIUM: 9 mg/dL (ref 8.9–10.3)
CO2: 28 mmol/L (ref 22–32)
Chloride: 101 mmol/L (ref 101–111)
Creatinine, Ser: 1.06 mg/dL — ABNORMAL HIGH (ref 0.44–1.00)
GFR calc Af Amer: >60 mL/min (ref >60)
GFR, EST NON AFRICAN AMERICAN: 55 mL/min — AB (ref >60)
Glucose, Bld: 124 mg/dL — ABNORMAL HIGH (ref 65–99)
POTASSIUM: 4.1 mmol/L (ref 3.5–5.1)
SODIUM: 138 mmol/L (ref 135–145)

## 2015-02-15 LAB — CA 125: CA 125: 642.9 U/mL — ABNORMAL HIGH (ref 0.0–38.1)

## 2015-02-15 LAB — HEPARIN LEVEL (UNFRACTIONATED): Heparin Unfractionated: >2.2 IU/mL — ABNORMAL HIGH (ref 0.30–0.70)

## 2015-02-15 LAB — APTT: aPTT: 38 seconds — ABNORMAL HIGH (ref 24–37)

## 2015-02-15 MED ORDER — HEPARIN (PORCINE) IN NACL 100-0.45 UNIT/ML-% IJ SOLN
1400.0000 [IU]/h | INTRAMUSCULAR | Status: DC
  Administered 2015-02-15: 1400 [IU]/h via INTRAVENOUS
  Filled 2015-02-15: qty 250

## 2015-02-15 MED ORDER — IOHEXOL 300 MG/ML  SOLN
75.0000 mL | Freq: Once | INTRAMUSCULAR | Status: AC | PRN
  Administered 2015-02-15: 75 mL via INTRAVENOUS

## 2015-02-15 MED ORDER — SODIUM CHLORIDE 0.9 % IV SOLN
Freq: Once | INTRAVENOUS | Status: AC
  Administered 2015-02-15: 12:00:00 via INTRAVENOUS

## 2015-02-15 NOTE — Discharge Instructions (Signed)
Information on my medicine - ELIQUIS (apixaban)  This medication education was reviewed with me or my healthcare representative as part of my discharge preparation.  The pharmacist that spoke with me during my hospital stay was:  Darvin Neighbours, PHARMD CANDIDATE  Why was Eliquis prescribed for you? Eliquis was prescribed to treat blood clots that may have been found in the veins of your legs (deep vein thrombosis) or in your lungs (pulmonary embolism) and to reduce the risk of them occurring again.  What do You need to know about Eliquis ? The starting dose is 10 mg (two 5 mg tablets) taken TWICE daily for the FIRST SEVEN (7) DAYS, then on (enter date)  02/22/2015  the dose is reduced to ONE 5 mg tablet taken TWICE daily.  Eliquis may be taken with or without food.   Try to take the dose about the same time in the morning and in the evening. If you have difficulty swallowing the tablet whole please discuss with your pharmacist how to take the medication safely.  Take Eliquis exactly as prescribed and DO NOT stop taking Eliquis without talking to the doctor who prescribed the medication.  Stopping may increase your risk of developing a new blood clot.  Refill your prescription before you run out.  After discharge, you should have regular check-up appointments with your healthcare provider that is prescribing your Eliquis.    What do you do if you miss a dose? If a dose of ELIQUIS is not taken at the scheduled time, take it as soon as possible on the same day and twice-daily administration should be resumed. The dose should not be doubled to make up for a missed dose.  Important Safety Information A possible side effect of Eliquis is bleeding. You should call your healthcare provider right away if you experience any of the following: ? Bleeding from an injury or your nose that does not stop. ? Unusual colored urine (red or dark brown) or unusual colored stools (red or black). ? Unusual  bruising for unknown reasons. ? A serious fall or if you hit your head (even if there is no bleeding).  Some medicines may interact with Eliquis and might increase your risk of bleeding or clotting while on Eliquis. To help avoid this, consult your healthcare provider or pharmacist prior to using any new prescription or non-prescription medications, including herbals, vitamins, non-steroidal anti-inflammatory drugs (NSAIDs) and supplements.  This website has more information on Eliquis (apixaban): http://www.eliquis.com/eliquis/home

## 2015-02-15 NOTE — Progress Notes (Signed)
ANTICOAGULATION CONSULT NOTE - Initial Consult  Pharmacy Consult for Heparin Indication: DVT  No Known Allergies  Patient Measurements: Height: 5\' 2"  (157.5 cm) Weight: 180 lb 3.2 oz (81.738 kg) IBW/kg (Calculated) : 50.1   Vital Signs: Temp: 98.2 F (36.8 C) (02/14 1707) Temp Source: Oral (02/14 1707) BP: 144/61 mmHg (02/14 1707) Pulse Rate: 73 (02/14 1707)  Labs:  Recent Labs  02/12/15 2305  02/13/15 0127  02/13/15 1415 02/13/15 2236 02/14/15 0556 02/15/15 0519  HGB  --   --  7.6*  --   --   --   --  6.7*  HCT  --   --  25.2*  --   --   --   --  22.6*  PLT  --   --  379  --   --   --   --  367  APTT 31  --   --   --   --   --   --   --   LABPROT 15.2  --   --   --   --   --   --   --   INR 1.19  --   --   --   --   --   --   --   HEPARINUNFRC  --   < >  --   < > 0.31 0.46 0.52  --   CREATININE  --   --  0.73  --   --   --  0.93 1.06*  < > = values in this interval not displayed.  Estimated Creatinine Clearance: 55.2 mL/min (by C-G formula based on Cr of 1.06).   Medical History: Past Medical History  Diagnosis Date  . History of radiation therapy 10/21/08-11/23/08 & 12/07/10/13/12/23 2010    ENDOMETRIOID   . Uterine cancer (HCC)     ENDOMETRIAL CA& OVARY CANCER  . Hypertension      Assessment:  60 yr female with h/o uterine cancer presents with left leg swelling/pain and knot to groin  Abdominal CT shows evidence of recurrence of neoplastic disease and acute DVT of LLE.   Pt initially started on heparin drip, underwent biopsy and transitioned to Eliquis on 2/13  MD notes possible further procedures and has d/c'ed Eliquis and consulted pharmacy to resume IV heparin  Will check baseline aPTT and heparin level prior to starting heparin to determine which to titrate heparin from as Eliquis just recently started.  If Heparin level < 0.1 & aPTT < 47, will use only heparin level in the future.  If HL > 0.1 and does not correlate with aPTT, will titrate  according to aPTT until both labs correlate.  Last dose of Eliquis was charted as given on 2/14 @ 10:43.  IV heparin to not begin until > 12 hr after Eliquis dose   Goal of Therapy:  Heparin Level = 0.3-0.7  APTT  = 66-102 sec   Plan:   At 22:00 obtain baseline aPTT and HL  At 23:00 will restart IV heparin @ 1400 units/hr (previously therapeutic dose)  Check aPTT and heparin level 6 hr after heparin started  Follow Heparin level & CBC daily  Follow aPTT to guide heparin dosing until aPTT and HL correlate   Leone Haven, PharmD  02/15/2015, 6:34 PM

## 2015-02-15 NOTE — Progress Notes (Signed)
[facility] PROGRESS NOTE  [name] GUR:[id number] DOB: Jul 14, 1955 DOA: 02/12/2015 PCP: No primary care provider on file.  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 She presented with a three-day history of increasing left lower extremity swelling and pain and a three-month history of a large knot in the right groin area. In the emergency department, she was found to have an acute DVT of the left lower extremity, and multiple enlarged lymph nodes, numerous pulmonary nodules up to 2.4 cm, and a left psoas muscle mass concerning for recurrent cancer.  She was placed on a heparin drip and admitted for further evaluation.  Assessment/Plan  Acute left lower extremity DVT -  Started on eliquis on 2/13 and received two doses, but may need additional procedure. -  Resume heparin drip this evening -  Apply TED hose to left leg - Case manager has met with patient and set her up with free Eliquis through [facility]   Enlarged lymph nodes, pulmonary nodules, and left psoas mass concerning for recurrence of malignancy (either ovarian or endometrial) however CT scan demonstrates a possible pulmonary primary with a large left upper lobe mass that is obstructing the lingular bronchus -  Appreciate gynecology-oncology assistance -  Appreciate Interventional radiology assistance with biopsy  -  Pathology and cytology from right inguinal lymph node biopsy pending, however report may be ready by Wednesday morning -  I spoke again with oncology who recommended presuming that the pathology report will be nondiagnostic -  Pulmonology to consult on 2/15 in preparation for a possible bronchoscopy with biopsy -  Transition to heparin drip and will make patient  nothing by mouth at midnight -  If pathology report demonstrates squamous cell or small cell cancer consistent with lung primary this may be adequate, however at least we are set up for additional biopsy if necessary  Hypokalemia, resolved with oral potassium supplementation  Essential hypertension with  -  Continue clonidine to 3 times a day -  Continue labetalol TID -  Continue HCTZ -  Continue when necessary hydralazine  Microcytic anemia, I suspect a combination of iron deficiency and anemia of chronic inflammation -  Repeat hgb in AM  Iron deficiency anemia and anemia of chronic disease, occult stool negative, B12, folate, TSH wnl, progressive anemia - Continue iron supplementation -  Transfuse 2 units PRBCs  Diet:  Regular, then nothing by mouth at midnight Access:  PIV IVF:  Off Proph:  Heparin drip  Code Status: Full code Family Communication: Patient and extended family  Disposition Plan:   Home in a few days, pending possible bronchoscopy with biopsy. If her pathology report is diagnostic, would need to touch base with oncology and gynecology oncology, but could discharge home on Eliquis with close follow-up if hemoglobin improved   Consultants:  GYN-ONC  IR  Procedures:  CT abd/pelvis  CT chest  Antibiotics:  none   HPI/Subjective:  Pain in left leg marginally improved. She feels that her swelling has also gone down somewhat. When she takes pain medication she falls asleep but she is okay with this. She denies dyspnea but has a dry cough. She denies nausea, vomiting, rectal bleeding. She has had a small amount of oozing from her biopsy site in her right groin. She is saturated one  pad this morning, but has been okay since.  Objective: Filed Vitals:   02/15/15 1258 02/15/15 1615 02/15/15 1642 02/15/15 1707  BP: 128/58 139/57 137/51 144/61  Pulse: 70 69 76 73  Temp: 98.3 F (36.8 C) 98.2 F (36.8 C) 98.2 F (36.8 C) 98.2 F (36.8 C)  TempSrc: Oral  Oral Oral Oral  Resp: _0 Height:      Weight:      SpO2: 97% 96% 97% 96%    Intake/Output Summary (Last 24 hours) at 02/15/15 1753 Last data filed at 02/15/15 1652  Gross per 24 hour  Intake 875.33 ml  Output      0 ml  Net 875.33 ml   Filed Weights   02/12/15 2243 02/13/15 0021  Weight: 92.987 kg (205 lb) 81.738 kg (180 lb 3.2 oz)   Body mass index is 32.95 kg/(m^2).  Exam:   General:  Adult female, No acute distress  HEENT:  NCAT, MMM  Cardiovascular:  RRR, nl S1, S2 no mrg, 2+ pulses, warm extremities  Respiratory:  CTAB, no increased WOB  Abdomen:   NABS, soft, NT/ND  MSK:   Normal tone and bulk, 2+ left lower extremity edema, large palpable nodule in the right groin area mildly tender to palpation, no surrounding or overlying erythema or induration. A 3 centimeter area of dried blood on her ABD pad, no obvious bleeding from biopsy site at this time.   Neuro:  Grossly intact  Data Reviewed: Basic Metabolic Panel:  Recent Labs Lab 02/12/15 1615 02/13/15 0127 02/14/15 0556 02/15/15 0519  NA 139 138 135 138  K 3.1* 3.7 3.7 4.1  CL 101 102 99* 101  CO2 _1 GLUCOSE 106* 117* 116* 124*  BUN _2 CREATININE 0.89 0.73 0.93 1.06*  CALCIUM 9.2 8.9 8.9 9.0  MG  --  1.9  --   --    Liver Function Tests: No results for input(s): AST, ALT, ALKPHOS, BILITOT, PROT, ALBUMIN in the last 168 hours. No results for input(s): LIPASE, AMYLASE in the last 168 hours. No results for input(s): AMMONIA in the last 168 hours. CBC:  Recent Labs Lab 02/12/15 1615 02/13/15 0127 02/15/15 0519  WBC 10.6* 13.9* 9.5  HGB 8.4* 7.6* 6.7*  HCT 27.8* 25.2* 22.6*  MCV 70.4* 70.6* 71.1*  PLT 404* 379 367    No results found for this or any previous visit (from the past 240 hour(s)).   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 Visualized lung bases on CT abdomen and pelvis 3 days ago demonstrated multiple pulmonary nodules. CT chest requested for complete evaluation. EXAM: CT CHEST WITH CONTRAST TECHNIQUE: Multidetector CT imaging of the chest was performed during intravenous contrast administration. CONTRAST:  46m OMNIPAQUE IOHEXOL 300 MG/ML IV. COMPARISON:  Visualized lung windows all CT abdomen and pelvis 02/12/2015. CT chest 03/25/2008. FINDINGS: Cardiovascular: Cardiac silhouette mildly enlarged, unchanged. No visible coronary atherosclerosis. No significant pericardial effusion. Moderate atherosclerosis involving the thoracic aorta without evidence of aneurysm. Bovine aortic arch anatomy (left common carotid artery arises from the innominate artery). Mediastinum/Lymph Nodes: Numerous enlarged mediastinal lymph nodes. An AP window (station 5) node measures approximately 4.0 x 3.2 x 5.4 cm. An index low right paratracheal (station 4R) node measures approximately 2.4 x 2.1 x  2.7 cm. Left hilar nodal mass extending into the mediastinum and contiguous with a a mass in the central left upper lobe, measured below. No pathologic right hilar lymphadenopathy. No pathologic axillary lymphadenopathy. Small hiatal hernia. Normal-appearing esophagus. Approximate 1.8 cm nodule arising from the right side of the thyroid isthmus, best seen on the coronal images. Lungs/Pleura: Innumerable nodules throughout both lungs. Large mass centrally in the left upper lobe, contiguous with left hilar lymphadenopathy and with extension into the mediastinum, approximate measurements 6.2 x 6.7 x 6.9 cm (measured on soft tissue window images). The mass occludes the lingular bronchus. A mass in the posterior left lower lobe measures approximately 3.5 x 3.8 x 4.7 cm. No pleural masses.  No pleural effusions. Upper abdomen: Please see the report of the CT abdomen and pelvis performed 3  days ago. Musculoskeletal: Mild degenerative disc disease and spondylosis involving the upper and mid thoracic spine. No evidence of osseous metastatic disease. IMPRESSION: 1. Large mass approximating 7 cm in the central left upper lobe with extension into the left hilum and into the mediastinum, occluding the lingular bronchus. While this may represent metastatic disease from the prior gynecologic malignancy, it is worrisome for a primary pulmonary malignancy. 2. Multiple bilateral pulmonary parenchymal metastases, the next largest mass approximating 5 cm in the left lower lobe. 3. Pathologic mediastinal lymphadenopathy. 4. Approximate 1.8 cm nodule arising from the right side of the thyroid isthmus. Given the patient's comorbidities, further imaging evaluation is not felt necessary. This follows ACR consensus guidelines: Managing Incidental Thyroid Nodules Detected on Imaging: White Paper of the ACR Incidental Thyroid Findings Committee. J Am Coll Radiol 2015; 12:143-150. Electronically Signed   By: Evangeline Dakin M.D.   On: 02/15/2015 10:28   US Biopsy  02/15/2015  CLINICAL DATA:  History of endometrial and ovarian malignancy with large necrotic right inguinal lymph node, pelvic lymphadenopathy and additional retroperitoneal lymphadenopathy by CT. EXAM: ULTRASOUND GUIDED NEEDLE ASPIRATE AND CORE BIOPSY OF RIGHT INGUINAL LYMPH NODE COMPARISON:  CT of the abdomen and pelvis on 02/12/2015. MEDICATIONS: 2.0 mg IV Versed; 75 mcg IV Fentanyl Total Moderate Sedation Time: 20 minutes. The patient's level of consciousness and physiologic status were continuously monitored during the procedure by Radiology nursing. PROCEDURE: The procedure, risks, benefits, and alternatives were explained to the patient. Questions regarding the procedure were encouraged and answered. The patient understands and consents to the procedure. A time-out was performed prior to the procedure. The right inguinal region was prepped with  chlorhexidine in a sterile fashion, and a sterile drape was applied covering the operative field. A sterile gown and sterile gloves were used for the procedure. Local anesthesia was provided with 1% Lidocaine. Initially, liquefied component of a necrotic right inguinal lymph node was aspirated via a Yueh centesis catheter. Aspirated fluid was sent for cytologic analysis. Core biopsy was performed of the solid component of the enlarged right inguinal lymph node with a 16 gauge core needle device. A total of 5 core samples were obtained and submitted in saline. Additional sonographic imaging was performed following biopsy. COMPLICATIONS: None. FINDINGS: Massively enlarged and partially necrotic right inguinal lymph node identified by ultrasound measuring approximately 8.5 cm in greatest diameter. Initial aspiration of a more superficial fluid component yielded 60 mL of dark brown fluid. This resulted in significant decompression of the fluid component. Significant solid component of the lymph node is also present with distorted and heterogeneous soft tissue present. Core biopsy yielded solid tissue. IMPRESSION: Aspirate and core biopsy performed of enlarged  and necrotic right inguinal lymph node. Aspirated fluid was sent for cytologic analysis. Solid tissue was sent for histopathologic analysis. Electronically Signed   By: Aletta Edouard M.D.   On: 02/15/2015 07:44   US Aspiration  02/15/2015  CLINICAL DATA:  History of endometrial and ovarian malignancy with large necrotic right inguinal lymph node, pelvic lymphadenopathy and additional retroperitoneal lymphadenopathy by CT. EXAM: ULTRASOUND GUIDED NEEDLE ASPIRATE AND CORE BIOPSY OF RIGHT INGUINAL LYMPH NODE COMPARISON:  CT of the abdomen and pelvis on 02/12/2015. MEDICATIONS: 2.0 mg IV Versed; 75 mcg IV Fentanyl Total Moderate Sedation Time: 20 minutes. The patient's level of consciousness and physiologic status were continuously monitored during the procedure  by Radiology nursing. PROCEDURE: The procedure, risks, benefits, and alternatives were explained to the patient. Questions regarding the procedure were encouraged and answered. The patient understands and consents to the procedure. A time-out was performed prior to the procedure. The right inguinal region was prepped with chlorhexidine in a sterile fashion, and a sterile drape was applied covering the operative field. A sterile gown and sterile gloves were used for the procedure. Local anesthesia was provided with 1% Lidocaine. Initially, liquefied component of a necrotic right inguinal lymph node was aspirated via a Yueh centesis catheter. Aspirated fluid was sent for cytologic analysis. Core biopsy was performed of the solid component of the enlarged right inguinal lymph node with a 16 gauge core needle device. A total of 5 core samples were obtained and submitted in saline. Additional sonographic imaging was performed following biopsy. COMPLICATIONS: None. FINDINGS: Massively enlarged and partially necrotic right inguinal lymph node identified by ultrasound measuring approximately 8.5 cm in greatest diameter. Initial aspiration of a more superficial fluid component yielded 60 mL of dark brown fluid. This resulted in significant decompression of the fluid component. Significant solid component of the lymph node is also present with distorted and heterogeneous soft tissue present. Core biopsy yielded solid tissue. IMPRESSION: Aspirate and core biopsy performed of enlarged and necrotic right inguinal lymph node. Aspirated fluid was sent for cytologic analysis. Solid tissue was sent for histopathologic analysis. Electronically Signed   By: Aletta Edouard M.D.   On: 02/15/2015 07:44    Scheduled Meds: . apixaban  10 mg Oral BID   Followed by  . [START ON 02/21/2015] apixaban  5 mg Oral BID  . cloNIDine  0.1 mg Oral TID  . feeding supplement (ENSURE ENLIVE)  237 mL Oral BID BM  . ferrous sulfate  325 mg Oral  BID WC  . hydrochlorothiazide  25 mg Oral Daily  . labetalol  100 mg Oral TID  . sodium chloride flush  3 mL Intravenous Q12H  . sodium chloride flush  3 mL Intravenous Q12H   Continuous Infusions:    Principal Problem:   DVT (deep venous thrombosis), left Active Problems:   Essential hypertension   Uterine cancer (HCC)   Hypokalemia   Acute deep vein thrombosis (DVT) (HCC)   Anemia of chronic disease   Anemia, iron deficiency    Time spent: 30 min    Saidee Geremia, [facility] Pager [phone]. If 7PM-7AM, please contact night-coverage at [email or web address], password [facility] 02/15/2015, 5:53 PM  LOS: 3 days

## 2015-02-16 ENCOUNTER — Other Ambulatory Visit: Payer: Self-pay | Admitting: Oncology

## 2015-02-16 DIAGNOSIS — I82402 Acute embolism and thrombosis of unspecified deep veins of left lower extremity: Principal | ICD-10-CM

## 2015-02-16 DIAGNOSIS — Z8543 Personal history of malignant neoplasm of ovary: Secondary | ICD-10-CM

## 2015-02-16 DIAGNOSIS — C801 Malignant (primary) neoplasm, unspecified: Secondary | ICD-10-CM

## 2015-02-16 DIAGNOSIS — D509 Iron deficiency anemia, unspecified: Secondary | ICD-10-CM

## 2015-02-16 DIAGNOSIS — Z8542 Personal history of malignant neoplasm of other parts of uterus: Secondary | ICD-10-CM

## 2015-02-16 DIAGNOSIS — I10 Essential (primary) hypertension: Secondary | ICD-10-CM

## 2015-02-16 DIAGNOSIS — E876 Hypokalemia: Secondary | ICD-10-CM

## 2015-02-16 DIAGNOSIS — D638 Anemia in other chronic diseases classified elsewhere: Secondary | ICD-10-CM

## 2015-02-16 LAB — CBC
HCT: 29.6 % — ABNORMAL LOW (ref 36.0–46.0)
HEMOGLOBIN: 9.2 g/dL — AB (ref 12.0–15.0)
MCH: 23.2 pg — ABNORMAL LOW (ref 26.0–34.0)
MCHC: 31.1 g/dL (ref 30.0–36.0)
MCV: 74.6 fL — ABNORMAL LOW (ref 78.0–100.0)
PLATELETS: 373 10*3/uL (ref 150–400)
RBC: 3.97 MIL/uL (ref 3.87–5.11)
RDW: 19.8 % — ABNORMAL HIGH (ref 11.5–15.5)
WBC: 11.2 10*3/uL — ABNORMAL HIGH (ref 4.0–10.5)

## 2015-02-16 LAB — TYPE AND SCREEN
ABO/RH(D): O POS
Antibody Screen: NEGATIVE
Unit division: 0
Unit division: 0

## 2015-02-16 LAB — URINALYSIS, ROUTINE W REFLEX MICROSCOPIC
BILIRUBIN URINE: NEGATIVE
Glucose, UA: NEGATIVE mg/dL
HGB URINE DIPSTICK: NEGATIVE
Ketones, ur: NEGATIVE mg/dL
Nitrite: NEGATIVE
PH: 6.5 (ref 5.0–8.0)
Protein, ur: NEGATIVE mg/dL
SPECIFIC GRAVITY, URINE: 1.014 (ref 1.005–1.030)

## 2015-02-16 LAB — COMPREHENSIVE METABOLIC PANEL
ALT: 15 U/L (ref 14–54)
AST: 15 U/L (ref 15–41)
Albumin: 2.8 g/dL — ABNORMAL LOW (ref 3.5–5.0)
Alkaline Phosphatase: 90 U/L (ref 38–126)
Anion gap: 9 (ref 5–15)
BUN: 13 mg/dL (ref 6–20)
CHLORIDE: 102 mmol/L (ref 101–111)
CO2: 28 mmol/L (ref 22–32)
CREATININE: 0.9 mg/dL (ref 0.44–1.00)
Calcium: 9.5 mg/dL (ref 8.9–10.3)
GFR calc Af Amer: >60 mL/min (ref >60)
GFR calc non Af Amer: >60 mL/min (ref >60)
Glucose, Bld: 132 mg/dL — ABNORMAL HIGH (ref 65–99)
Potassium: 4 mmol/L (ref 3.5–5.1)
Sodium: 139 mmol/L (ref 135–145)
Total Bilirubin: 0.5 mg/dL (ref 0.3–1.2)
Total Protein: 7.2 g/dL (ref 6.5–8.1)

## 2015-02-16 LAB — URINE MICROSCOPIC-ADD ON: RBC / HPF: NONE SEEN RBC/hpf (ref 0–5)

## 2015-02-16 LAB — BASIC METABOLIC PANEL
Anion gap: 10 (ref 5–15)
BUN: 15 mg/dL (ref 6–20)
CHLORIDE: 100 mmol/L — AB (ref 101–111)
CO2: 26 mmol/L (ref 22–32)
CREATININE: 0.98 mg/dL (ref 0.44–1.00)
Calcium: 9.3 mg/dL (ref 8.9–10.3)
GFR calc Af Amer: >60 mL/min (ref >60)
GFR calc non Af Amer: >60 mL/min (ref >60)
Glucose, Bld: 129 mg/dL — ABNORMAL HIGH (ref 65–99)
POTASSIUM: 4.3 mmol/L (ref 3.5–5.1)
SODIUM: 136 mmol/L (ref 135–145)

## 2015-02-16 LAB — APTT
APTT: 60 s — AB (ref 24–37)
aPTT: 58 seconds — ABNORMAL HIGH (ref 24–37)

## 2015-02-16 LAB — HEPARIN LEVEL (UNFRACTIONATED)

## 2015-02-16 MED ORDER — APIXABAN 5 MG PO TABS
10.0000 mg | ORAL_TABLET | Freq: Two times a day (BID) | ORAL | Status: DC
  Administered 2015-02-16 – 2015-02-17 (×3): 10 mg via ORAL
  Filled 2015-02-16 (×3): qty 2

## 2015-02-16 MED ORDER — HEPARIN (PORCINE) IN NACL 100-0.45 UNIT/ML-% IJ SOLN: 1500.0000 [IU]/h | INTRAMUSCULAR | Status: DC

## 2015-02-16 MED ORDER — APIXABAN 5 MG PO TABS: 5.0000 mg | ORAL_TABLET | Freq: Two times a day (BID) | ORAL | Status: DC

## 2015-02-16 MED ORDER — FERUMOXYTOL INJECTION 510 MG/17 ML
510.0000 mg | Freq: Once | INTRAVENOUS | Status: AC
  Administered 2015-02-16: 510 mg via INTRAVENOUS
  Filled 2015-02-16: qty 17

## 2015-02-16 NOTE — Progress Notes (Addendum)
Patient ID: [name], female   DOB: 1955-03-18, 60 y.o.   MRN: [id number] [facility] PROGRESS NOTE  [name] [id number] DOB: May 22, 1955 DOA: 02/12/2015 PCP: Oncology - Dr. 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 She presented with a three-day history of increasing left lower extremity swelling and pain and a three-month history of a large knot in the right groin area.  In the emergency department, she was found to have an acute DVT of the left lower extremity and multiple enlarged lymph nodes, numerous pulmonary nodules up to 2.4 cm and a left psoas muscle mass concerning for recurrent cancer.Cytology of one lymph node positive for metastatic adenocarcinoma.   Assessment/Plan:    Principal Problem: Acute left lower extremity DVT / Leukocytosis  - Now on Eliquis and she will be discharged home with eliquis    Active Problems: Enlarged lymph nodes, pulmonary nodules, and left psoas mass - Concerning for recurrence of malignancy (either ovarian or endometrial) however CT scan demonstrates a possible pulmonary primary with a large left upper lobe mass that is obstructing the lingular bronchus - She had LN biopsy consistent with metastatic adenocarcinoma - Appreciate Dr. Hans Eden recommendations - I dont think that lung biopsy is necessarily at this time since cytology of LN points towards adenocarcinoma. Pulm agrees. Will call if needed.  Hypokalemia - Due to Hctz - Supplemented   Essential hypertension   - Continue clonidine 3 times a day - Continue labetalol TID - Continue HCTZ  Iron deficiency anemia and anemia of chronic disease - Due to malignancy - Hemoglobin stable - Had had 2 U RBC transfusion - Continue iron  supplementation    DVT Prophylaxis  - Continue apixaban    Code Status: Full.  Family Communication:  plan of care discussed with the patient Disposition Plan: Home likely by 02/18/2015  IV access:  Peripheral IV  Procedures and diagnostic studies:    Ct Chest W Contrast 02/15/2015  1. Large mass approximating 7 cm in the central left upper lobe with extension into the left hilum and into the mediastinum, occluding the lingular bronchus. While this may represent metastatic disease from the prior gynecologic malignancy, it is worrisome for a primary pulmonary malignancy. 2. Multiple bilateral pulmonary parenchymal metastases, the next largest mass approximating 5 cm in the left lower lobe. 3. Pathologic mediastinal lymphadenopathy. 4. Approximate 1.8 cm nodule arising from the right side of the thyroid isthmus. Given the patient's comorbidities, further imaging evaluation is not felt necessary. This follows ACR consensus guidelines: Managing Incidental Thyroid Nodules Detected on Imaging: White Paper of the ACR Incidental Thyroid Findings Committee. J Am Coll Radiol 2015; 12:143-150. Electronically Signed   By: Evangeline Dakin M.D.   On: 02/15/2015 10:28   Ct Abdomen Pelvis W Contrast 02/12/2015  Status post hysterectomy. 8.5 x 7.1 cm necrotic right inguinal node, corresponding to the palpable abnormality, suspicious for nodal metastasis. Additional retroperitoneal/ left pelvic nodal metastases, as above. 6.1 cm low-density lesion in the left psoas muscle, favored to reflect tumor, less likely infection. Secondary involvement of the left L4 and L5 vertebral bodies. Associated acute deep venous thrombosis involving the left external iliac vein and extending into the left lower extremity, likely secondary to extrinsic compression by pelvic tumor. Pulmonary nodules/metastases at the lung bases, measuring up  to 2.4 cm in the left lower lobe. These results were called by telephone at the time of  interpretation on 02/12/2015 at 9:37 pm to Dr. Lacretia Leigh, who verbally acknowledged these results. Electronically Signed   By: Julian Hy M.D.   On: 02/12/2015 21:39   US Biopsy 02/15/2015 Aspirate and core biopsy performed of enlarged and necrotic right inguinal lymph node. Aspirated fluid was sent for cytologic analysis. Solid tissue was sent for histopathologic analysis. Electronically Signed   By: Aletta Edouard M.D.   On: 02/15/2015 07:44   US Aspiration 02/15/2015  Aspirate and core biopsy performed of enlarged and necrotic right inguinal lymph node. Aspirated fluid was sent for cytologic analysis. Solid tissue was sent for histopathologic analysis.   Medical Consultants:  GYN-onc IR  IAnti-Infectives:   None    Leisa Lenz, MD  [facility] Pager [phone]  Time spent in minutes: 25 minutes  If 7PM-7AM, please contact night-coverage [email or web address] Password [email or web address] 02/16/2015, 4:16 PM   LOS: 4 days    HPI/Subjective: No acute overnight events. Patient says she feels okay.   Objective: Filed Vitals:   02/15/15 1707 02/15/15 2001 02/16/15 0604 02/16/15 1454  BP: 144/61 155/62 167/79 154/59  Pulse: 73 71 63 64  Temp: 98.2 F (36.8 C) 98.4 F (36.9 C) 98.3 F (36.8 C) 98.7 F (37.1 C)  TempSrc: Oral Oral Oral Oral  Resp: 22 20 20 18   Height:      Weight:      SpO2: 96% 96% 96% 96%    Intake/Output Summary (Last 24 hours) at 02/16/15 1616 Last data filed at 02/15/15 2352  Gross per 24 hour  Intake  625.5 ml  Output      0 ml  Net  625.5 ml    Exam:   General:  Pt is alert, follows commands appropriately, not in acute distress  Cardiovascular: Regular rate and rhythm, S1/S2 (+)  Respiratory: Clear to auscultation bilaterally, no wheezing, no crackles, no rhonchi  Abdomen: Soft, non tender, non distended, bowel sounds present  Extremities: LE edema, pulses DP and PT palpable bilaterally  Neuro: Grossly nonfocal  Data Reviewed: Basic  Metabolic Panel:  Recent Labs Lab 02/13/15 0127 02/14/15 0556 02/15/15 0519 02/16/15 0455 02/16/15 1455  NA 138 135 138 136 139  K 3.7 3.7 4.1 4.3 4.0  CL 102 99* 101 100* 102  CO2 25 26 28 26 28   GLUCOSE 117* 116* 124* 129* 132*  BUN 11 16 15 15 13   CREATININE 0.73 0.93 1.06* 0.98 0.90  CALCIUM 8.9 8.9 9.0 9.3 9.5  MG 1.9  --   --   --   --    Liver Function Tests:  Recent Labs Lab 02/16/15 1455  AST 15  ALT 15  ALKPHOS 90  BILITOT 0.5  PROT 7.2  ALBUMIN 2.8*   No results for input(s): LIPASE, AMYLASE in the last 168 hours. No results for input(s): AMMONIA in the last 168 hours. CBC:  Recent Labs Lab 02/12/15 1615 02/13/15 0127 02/15/15 0519 02/16/15 0455  WBC 10.6* 13.9* 9.5 11.2*  HGB 8.4* 7.6* 6.7* 9.2*  HCT 27.8* 25.2* 22.6* 29.6*  MCV 70.4* 70.6* 71.1* 74.6*  PLT 404* 379 367 373   Cardiac Enzymes: No results for input(s): CKTOTAL, CKMB, CKMBINDEX, TROPONINI in the last 168 hours. BNP: Invalid input(s): POCBNP CBG: No results for input(s): GLUCAP in the last 168 hours.  No results found for this or any previous visit (from the past 240 hour(s)).  Scheduled Meds: . apixaban  10 mg Oral BID  . [START ON 02/23/2015] apixaban  5 mg Oral BID  . cloNIDine  0.1 mg Oral TID  . feeding supplement (ENSURE ENLIVE)  237 mL Oral BID BM  . ferrous sulfate  325 mg Oral BID WC  . hydrochlorothiazide  25 mg Oral Daily  . labetalol  100 mg Oral TID  . sodium chloride flush  3 mL Intravenous Q12H  . sodium chloride flush  3 mL Intravenous Q12H   Continuous Infusions:

## 2015-02-16 NOTE — Progress Notes (Signed)
MEDICAL ONCOLOGY   Reason for Consult:metastatic gyn adenocarcinoma Referring Physician: Everitt Amber  Case discussed directly with Dr Denman George on 02-14-15.  [name] is an 60 y.o. female who is seen at the request of Dr Denman George, with new diagnosis of metastatic gyn adenocarcinoma, with complication of LLE DVT. She is seen together with daughter and 3 family members now. 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 Complete records from 2010 will be requested from [facility]. Last follow up visit for the gyn cancers appears to have been with Dr Sondra Come in 11-201, apparently lost to follow up since then.  Patient reports that she does not have a PCP and has not seen any physicians in ~ 5 years. A previous physician has refilled antihypertensive medications.  HPI: Patient reports right inguinal mass for ~ 2 months, then LLE pain for ~ 3.5 weeks prior to presenting to ED on 02-12-15. She had recently made some changes in diet (a "Ileene Musa" thru her church with only fruits and vegetables), but also appetite not great so that she lost "2 dress sizes" in past 1-2 months. Only pain was in right groin, this improved since necrotic nodal mass was aspirated for 60 cc dark fluid on 02-15-15. She denies fever or other symptoms of infection and had no bleeding PTA. She denies SOB, cough, chest pain, wheezing. She denies HA, other neurologic changes other than drowsiness with medication in hospital. Bowels have been moving, denies melanotic stools. Bladder asymptomatic, apparently no hematuria. Some nausea. Had been less active over last couple of weeks with LE discomfort. Peripheral IV access has not been easy in hospital. In ED on 02-12-15 CT AP showed nodules in lung bases suspicious for metastatic disease, liver not  remarkable, no hydronephrosis, 6 mm right renal calculus, thrombus left external iliac vein into left common femoral vein, retroperitoneal and pelvic adenopathy, an 8.5 x 7.1 cm necrotic right inguinal node, post hyst ooph, no ascites, likely tumor involvement in left psoas with adjacent involvement of L4 and L5 vertebral bodies. Patient was admitted, begun on heparin qtt which was transitioned to Eliquis on 02-14-15. She had aspiration and core needle biopsy of right inguinal nodal mass on 02-15-15. CA 125 from 02-14-15 was 642.9, this having been 3.6 in 08-2010. She was seen in consultation by Dr Denman George on 02-14-15. Pathology finalized today shows metastatic adenocarcinoma with immunostains consistent with gyn primary ([id number]). She was transfused 2 units PRBCs for hgb 6.7, and begun on oral iron. Iron studies 2-12 with serum iron 14 and %sat 7.   ROS as above, remainder of 10 point Review of Systems negative.  Allergies: No Known Allergies Past Medical History  Diagnosis Date  . History of radiation therapy 10/21/08-11/23/08 & 12/07/10/13/12/23 2010    ENDOMETRIOID   . Uterine cancer (HCC)     ENDOMETRIAL CA& OVARY CANCER  . Hypertension     Past Surgical History  Procedure Laterality Date  . Tubal ligation      Family History  Problem Relation Age of Onset  . Bone cancer Paternal Grandmother     OSSEOUS METASTASIS    Social History:  reports that she has never smoked. She does not have any smokeless tobacco history on file. She reports that she does not drink alcohol or use illicit drugs. Lives with  husband in [place]  Medications: reviewed as listed in EMR. Has been on clonidine and recently HCTZ additionally for HTN  Blood pressure 167/79, pulse 63, temperature 98.3 F (36.8 C), temperature source Oral, resp. rate 20, height 5' 2"  (1.575 m), weight 180 lb 3.2 oz (81.738 kg), SpO2 96 %. Physical Exam Pleasant lady, somewhat drowsy, looks stated age, NAD sitting in bed.  Respirations not labored RA. Normal hair pattern. Oral mucosa moist, somewhat pale, clear. No obvious acute dental problems. Neck supple without JVD or thyroid mass. No cervical, supraclavicular adenopathy. Lungs without wheezes or rales, no use of accessory muscles. Heart RRR no gallop. Peripheral IV right lower forearm and NSL in right antecubital; access does not look great. Abdomen soft, not obviously distended, no HSM or clear intraabdominal mass. ~ 6 cm firm mass right inguinal, slight bloody drainage on clothes there.  Swelling RLE not tight. Feet warm. Moves all extremities easily in bed. Speech fluent, no focal deficits on bed exam. PSYCH appropriate mood and affect   Results for orders placed or performed during the hospital encounter of 02/12/15 (from the past 48 hour(s))  Basic metabolic panel     Status: Abnormal   Collection Time: 02/15/15  5:19 AM  Result Value Ref Range   Sodium 138 135 - 145 mmol/L   Potassium 4.1 3.5 - 5.1 mmol/L   Chloride 101 101 - 111 mmol/L   CO2 28 22 - 32 mmol/L   Glucose, Bld 124 (H) 65 - 99 mg/dL   BUN 15 6 - 20 mg/dL   Creatinine, Ser 1.06 (H) 0.44 - 1.00 mg/dL   Calcium 9.0 8.9 - 10.3 mg/dL   GFR calc non Af Amer 55 (L) >60 mL/min   GFR calc Af Amer >60 >60 mL/min    Comment: (NOTE) The eGFR has been calculated using the CKD EPI equation. This calculation has not been validated in all clinical situations. eGFR's persistently <60 mL/min signify possible Chronic Kidney Disease.    Anion gap 9 5 - 15  CBC     Status: Abnormal   Collection Time: 02/15/15  5:19 AM  Result Value Ref Range   WBC 9.5 4.0 - 10.5 K/uL   RBC 3.18 (L) 3.87 - 5.11 MIL/uL   Hemoglobin 6.7 (LL) 12.0 - 15.0 g/dL    Comment: CRITICAL RESULT CALLED TO, READ BACK BY AND VERIFIED WITH: CORCORAN,R RN [id number] COVINGTON,N    HCT 22.6 (L) 36.0 - 46.0 %   MCV 71.1 (L) 78.0 - 100.0 fL   MCH 21.1 (L) 26.0 - 34.0 pg   MCHC 29.6 (L) 30.0 - 36.0 g/dL   RDW 20.1 (H) 11.5 - 15.5  %   Platelets 367 150 - 400 K/uL  Type and screen [place]     Status: None   Collection Time: 02/15/15  6:58 AM  Result Value Ref Range   ABO/RH(D) O POS    Antibody Screen NEG    Sample Expiration 02/18/2015    Unit Number [id number]    Blood Component Type RED CELLS,LR    Unit division 00    Status of Unit ISSUED,FINAL    Transfusion Status OK TO TRANSFUSE    Crossmatch Result Compatible    Unit Number [id number]    Blood Component Type RBC LR PHER1    Unit division 00    Status of Unit ISSUED,FINAL    Transfusion Status OK TO TRANSFUSE    Crossmatch Result Compatible   Prepare RBC  Status: None   Collection Time: 02/15/15  6:58 AM  Result Value Ref Range   Order Confirmation ORDER PROCESSED BY BLOOD BANK   APTT     Status: Abnormal   Collection Time: 02/15/15  9:31 PM  Result Value Ref Range   aPTT 38 (H) 24 - 37 seconds    Comment:        IF BASELINE aPTT IS ELEVATED, SUGGEST PATIENT RISK ASSESSMENT BE USED TO DETERMINE APPROPRIATE ANTICOAGULANT THERAPY.   Heparin level (unfractionated)     Status: Abnormal   Collection Time: 02/15/15  9:31 PM  Result Value Ref Range   Heparin Unfractionated >2.20 (H) 0.30 - 0.70 IU/mL    Comment: RESULTS CONFIRMED BY MANUAL DILUTION        IF HEPARIN RESULTS ARE BELOW EXPECTED VALUES, AND PATIENT DOSAGE HAS BEEN CONFIRMED, SUGGEST FOLLOW UP TESTING OF ANTITHROMBIN III LEVELS.   Basic metabolic panel     Status: Abnormal   Collection Time: 02/16/15  4:55 AM  Result Value Ref Range   Sodium 136 135 - 145 mmol/L   Potassium 4.3 3.5 - 5.1 mmol/L   Chloride 100 (L) 101 - 111 mmol/L   CO2 26 22 - 32 mmol/L   Glucose, Bld 129 (H) 65 - 99 mg/dL   BUN 15 6 - 20 mg/dL   Creatinine, Ser 0.98 0.44 - 1.00 mg/dL   Calcium 9.3 8.9 - 10.3 mg/dL   GFR calc non Af Amer >60 >60 mL/min   GFR calc Af Amer >60 >60 mL/min    Comment: (NOTE) The eGFR has been calculated using the CKD EPI equation. This  calculation has not been validated in all clinical situations. eGFR's persistently <60 mL/min signify possible Chronic Kidney Disease.    Anion gap 10 5 - 15  CBC     Status: Abnormal   Collection Time: 02/16/15  4:55 AM  Result Value Ref Range   WBC 11.2 (H) 4.0 - 10.5 K/uL   RBC 3.97 3.87 - 5.11 MIL/uL   Hemoglobin 9.2 (L) 12.0 - 15.0 g/dL    Comment: DELTA CHECK NOTED REPEATED TO VERIFY POST TRANSFUSION SPECIMEN    HCT 29.6 (L) 36.0 - 46.0 %   MCV 74.6 (L) 78.0 - 100.0 fL   MCH 23.2 (L) 26.0 - 34.0 pg   MCHC 31.1 30.0 - 36.0 g/dL   RDW 19.8 (H) 11.5 - 15.5 %   Platelets 373 150 - 400 K/uL  APTT     Status: Abnormal   Collection Time: 02/16/15  4:55 AM  Result Value Ref Range   aPTT 60 (H) 24 - 37 seconds    Comment:        IF BASELINE aPTT IS ELEVATED, SUGGEST PATIENT RISK ASSESSMENT BE USED TO DETERMINE APPROPRIATE ANTICOAGULANT THERAPY.   Heparin level (unfractionated)     Status: Abnormal   Collection Time: 02/16/15  4:55 AM  Result Value Ref Range   Heparin Unfractionated >2.20 (H) 0.30 - 0.70 IU/mL    Comment: RESULTS CONFIRMED BY MANUAL DILUTION        IF HEPARIN RESULTS ARE BELOW EXPECTED VALUES, AND PATIENT DOSAGE HAS BEEN CONFIRMED, SUGGEST FOLLOW UP TESTING OF ANTITHROMBIN III LEVELS.   APTT     Status: Abnormal   Collection Time: 02/16/15 12:44 PM  Result Value Ref Range   aPTT 58 (H) 24 - 37 seconds    Comment:        IF BASELINE aPTT IS ELEVATED, SUGGEST PATIENT RISK ASSESSMENT BE  USED TO DETERMINE APPROPRIATE ANTICOAGULANT THERAPY.     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 Visualized lung bases on CT abdomen and pelvis 3 days ago demonstrated multiple pulmonary nodules. CT chest requested for complete evaluation. EXAM: CT CHEST WITH CONTRAST  TECHNIQUE: Multidetector CT imaging of the chest was performed during intravenous contrast administration. CONTRAST:  44m OMNIPAQUE IOHEXOL 300 MG/ML IV. COMPARISON:  Visualized lung windows all CT abdomen and pelvis 02/12/2015. CT chest 03/25/2008. FINDINGS: Cardiovascular: Cardiac silhouette mildly enlarged, unchanged. No visible coronary atherosclerosis. No significant pericardial effusion. Moderate atherosclerosis involving the thoracic aorta without evidence of aneurysm. Bovine aortic arch anatomy (left common carotid artery arises from the innominate artery). Mediastinum/Lymph Nodes: Numerous enlarged mediastinal lymph nodes. An AP window (station 5) node measures approximately 4.0 x 3.2 x 5.4 cm. An index low right paratracheal (station 4R) node measures approximately 2.4 x 2.1 x 2.7 cm. Left hilar nodal mass extending into the mediastinum and contiguous with a a mass in the central left upper lobe, measured below. No pathologic right hilar lymphadenopathy. No pathologic axillary lymphadenopathy. Small hiatal hernia. Normal-appearing esophagus. Approximate 1.8 cm nodule arising from the right side of the thyroid isthmus, best seen on the coronal images. Lungs/Pleura: Innumerable nodules throughout both lungs. Large mass centrally in the left upper lobe, contiguous with left hilar lymphadenopathy and with extension into the mediastinum, approximate measurements 6.2 x 6.7 x 6.9 cm (measured on soft tissue window images). The mass occludes the lingular bronchus. A mass in the posterior left lower lobe measures approximately 3.5 x 3.8 x 4.7 cm. No pleural masses.  No pleural effusions. Upper abdomen: Please see the report of the CT abdomen and pelvis performed 3 days ago. Musculoskeletal: Mild degenerative disc disease and spondylosis involving the upper and mid thoracic spine. No evidence of osseous metastatic disease. IMPRESSION: 1. Large mass approximating 7 cm in the central left upper lobe with extension  into the left hilum and into the mediastinum, occluding the lingular bronchus. While this may represent metastatic disease from the prior gynecologic malignancy, it is worrisome for a primary pulmonary malignancy. 2. Multiple bilateral pulmonary parenchymal metastases, the next largest mass approximating 5 cm in the left lower lobe. 3. Pathologic mediastinal lymphadenopathy. 4. Approximate 1.8 cm nodule arising from the right side of the thyroid isthmus. Given the patient's comorbidities, further imaging evaluation is not felt necessary. This follows ACR consensus guidelines: Managing Incidental Thyroid Nodules Detected on Imaging: White Paper of the ACR Incidental Thyroid Findings Committee. J Am Coll Radiol 2015; 12:143-150. Electronically Signed   By: TEvangeline DakinM.D.   On: 02/15/2015 10:28      CT ABDOMEN AND PELVIS WITH CONTRAST 02-12-2015  FINDINGS: Lower chest: 2.4 x 1.9 cm left lower lobe nodule (series 6/ image 3), suspicious for metastasis. Additional subcentimeter nodules in the visualized lung bases.  Hepatobiliary: Liver is within normal limits. No suspicious/enhancing hepatic lesions.  Gallbladder is unremarkable. No intrahepatic or extrahepatic ductal dilatation.  Pancreas: Within normal limits.  Spleen: Within normal limits.  Adrenals/Urinary Tract: Adrenal glands are within normal limits.  5.8 cm cyst in the lateral right lower kidney (series 2/ image 35). Additional 10 mm lateral right lower pole renal cyst (series 2/image 28). 6 mm nonobstructing right upper pole renal calculus (series 2/ image 26). No hydronephrosis.  Left kidney  is within normal limits.  Bladder is within normal limits.  Stomach/Bowel: Stomach is notable for a small hiatal hernia.  No evidence of bowel obstruction.  Normal appendix (series 2/image 55).  Vascular/Lymphatic: No evidence of abdominal aortic aneurysm.  Thrombus within the left external iliac vein (series  2/image 56) extending into the left common femoral vein (series 2/ image 68) and distally into the left lower extremity (series 2/image 81), likely secondary to extrinsic compression by pelvic tumor.  Retroperitoneal/pelvic nodal metastases, including:  --1.8 cm short axis left para-aortic node (series 2/ image 29)  --1.0 cm short axis inferior aortocaval node (series 2/image 39)  --2.7 cm short axis necrotic left external iliac node (series 2/image 63)  --8.5 x 7.1 cm necrotic right inguinal node (series 2/image 33), corresponding the palpable abnormality  Reproductive: Status post hysterectomy.  Bilateral ovaries are favored to be surgically absent.  Other: No abdominopelvic ascites.  Tiny fat containing periumbilical hernia (series 2/ image 38).  Musculoskeletal: 4.7 x 5.2 x 6.1 cm low-density lesion involving the left psoas muscle (series 2/ image 47), favored to reflect tumor, less likely infection.  Associated sclerosis involving the left aspect of the L4 and L5 vertebral bodies (series 2/ images 43 and 47), favored to reflect osseous metastases, less likely infection.  IMPRESSION: Status post hysterectomy.  8.5 x 7.1 cm necrotic right inguinal node, corresponding to the palpable abnormality, suspicious for nodal metastasis. Additional retroperitoneal/ left pelvic nodal metastases, as above.  6.1 cm low-density lesion in the left psoas muscle, favored to reflect tumor, less likely infection. Secondary involvement of the left L4 and L5 vertebral bodies.  Associated acute deep venous thrombosis involving the left external iliac vein and extending into the left lower extremity, likely secondary to extrinsic compression by pelvic tumor.  Pulmonary nodules/metastases at the lung bases, measuring up to 2.4 cm in the left lower lobe.    SURGICAL PATHOLOGY  FINAL for [name], [name] ([id number]) Patient: [name], [name] Collected: 02/14/2015  Client: [facility] Accession: [id number] Received: 02/14/2015 Harlene Ramus PATHOLOGY FINAL DIAGNOSIS Diagnosis Lymph node, needle/core biopsy, right inguinal METASTATIC CARCINOMA WITH PREDOMINANT COAGULATIVE NECROSIS CONSISTENT WITH GYN PRIMARY Microscopic Comment The biopsy consist of metastatic carcinoma with coagulative necrosis. The neoplasm stains positive for ck7 and ER, supporting these are GYN primary.        [facility] of [place], [place] [address] [phone]  REPORT OF SURGICAL PATHOLOGY  Case #: [id number] Patient Name: [name], [name]. Office Chart Number: N/A  MRN: [id number] Pathologist: H. Lynnell Chad, MD DOB/Age September 11, 1955 (Age: 88) Gender: F Date Taken: 03/18/2008 Date Received: 03/18/2008  FINAL DIAGNOSIS  MICROSCOPIC EXAMINATION AND DIAGNOSIS 1. RIGHT OVARY, EXCISION: - HIGH GRADE SURFACE EPITHELIAL CARCINOMA, 2.7 CM, CONFINED WITHIN RIGHT OVARY. - NO ANGIOLYMPHATIC INVASION IDENTIFIED. - MATURE CYSTIC TERATOMA. - FALLOPIAN TUBE: NO PATHOLOGIC ABNORMALITIES. - PLEASE SEE ONCOLOGY TEMPLATE  2. 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 - ANGIOLYMPHATIC INVASION PRESENT. - LEIOMYOMATA. - CERVIX: BENIGN SQUAMOUS MUCOSA AND ENDOCERVICAL MUCOSA, NO DYSPLASIA OR MALIGNANCY IDENTIFIED.  LEFT OVARY: - HIGH GRADE SURFACE EPITHELIAL CARCINOMA, 5.5 CM, CAPSULE RUPTURED; NO ANGIOLYMPHATIC INVASION SEEN. - FALLOPIAN TUBAL TISSUE WITH PARATUBAL CYST. - PLEASE SEE ONCOLOGY TEMPLATE.  3. OMENTUM, RESECTION: NEGATIVE FOR MALIGNANCY.    COMMENT ONCOLOGY TABLE OVARY CARCINOMA  1. Specimen(s): Uterus and cervix, bilateral ovaries and fallopian tubes  2. Procedure(s): Excision 3. Lymph node sampling performed: Not  performed 4. Primary tumor site: Bilateral ovaries 5. Ovarian surface involvement: Tumor invading through the left ovarian capsule 6. Maximum tumor size (cm): Right ovary: 2.7 cm; Left ovary: 5.5 cm, gross measurement 7. Histologic type: Surface epithelial carcinoma with predominant clear cell carcinoma component 8. Grade: High grade 9. Peritoneal implants: N/A 10. Pelvic extension: N/A 11. Peritoneal washings: Positive (WHC10-22) 12. Lymph nodes: # examined N/A; # positive N/A 13. TNM code: at least pT1c, pNX, pMX 14. FIGO Stage (based on pathologic findings, needs clinical correlation): IC 15. Comments: Section of the right ovary show high grade clear cell carcinoma with stromal invasion, confined within ovarian capsule. There is a 5.5 cm high grade surface epithelial carcinoma with predominant clear cell carcinoma component in the left ovary invading through the capsule. The peritoneal washing is positive for adenocarcinoma.  ONCOLOGY TABLE-UTERUS, CARCINOMA  16. Specimen: Uterus and cervix, bilateral ovaries 17. Procedure: Excision 18. Lymph node sampling performed: No 19. Specimen integrity: Intact 20. Maximum tumor size (cm): 7.0 cm 21. Histologic type: Endometrioid adenocarcinoma. 22. Grade: High grade 23. Myometrial invasion: 2.4 cm where myometrium is 2.9 cm in thickness 24. Cervical stromal involvement: No 25. Extent of involvement of other organs: N/A 26. Lymph vascular invasion: Present 27. Peritoneal washings: Positive (WHC10-22) 28. Lymph nodes: # examined N/A ; # positive N/A 29. TNM code: at least pT1b, pNX, pMX 67. FIGO Stage (based on pathologic findings, needs clinical correlation): 1B 31. Comments: There is a 7.0 cm high grade endometrioid adenocarcinoma invading into the outer half of the myometrium. Angiolymphatic is present.       DISCUSSION All of history above reviewed with patient and family. We have  discussed pathology information, available shortly prior to my consultation today. Patient is a nonsmoker and has never been exposed to significant cigarette smoke; it seems entirely likely that the pulmonary metastatic involvement is also related to recurrent metastatic gyn cancer. I have been clear that we do not have interventions that can cure this recurrent cancer, tho we can try to shrink and control the disease with systemic therapy. Although we did not discuss now, will ask radiation oncology if they suggest radiation to the large right inguinal node and/or to the LS involvement. As she is out over 6 years from completion of adjuvant carboplatin taxol, those agents would be reasonable to retry. I am concerned that peripheral IV access will be difficult for chemo, tho we may be able to start with that if unable to get PAC placed. If PAC could be done in hospital, could cover with heparin qtt for the procedure prior to resuming Eliquis.  I am also not clear if assistance for Eliquis will be ongoing or is only for a month's supply -? Reason for severe iron deficiency not clear. Will heme check stools and check urine. Suggest IV iron as feraheme or equivalent, at least first of 2  necessary doses hopefully to be given in hospital, as otherwise she will certainly require further transfusion support as we attempt chemotherapy.     Assessment/Plan: 1.metastatic adenocarcinoma of gyn primary, in patient with history of IC clear cell ovarian and IA high grade endometrial carcinomas 03-2008, treated with incomplete staging, TAH BSO, adjuvant carboplatin taxol x 6 cycles and radiation. Present metastatic disease involves large necrotic right inguinal node, paraaortic/ aortocaval/ external iliac nodes, and multiple bilateral pulmonary nodes. Patient seems in favor of beginning chemotherapy as outpatient. 2.LLE DVT: begun on Eliquis, I  believe due to availability of some assistance. I need to get clarification on  exactly what this assistance includes.  3.severe iron deficiency anemia: etiology not clear. Will heme check stools (patient tells me this was done in ED, tho I have not located that information in EMR) and UA. Suggest IV iron due to extremely low levels and upcoming chemotherapy 4.difficult IV access: really needs PAC for chemo, however placement complicated by Eliquis and acute, extensive LLE DVT. I do not think she should be off anticoagulation x 48 hours at this point, as is IR protocol for PAC and Eliquis. Will either need to manage with peripheral IVs to start + PAC later as outpatient, or possibly cover with heparin in hosptial with PAC placement this admission, then back to Eliquis. 5.overdue mammograms, general medical care, colonoscopy. Noncompliance with follow up after cancer diagnoses and treatment after ~ 2012 6.history HTN 7.recent weight loss, in part intentional.  Thank you for asking me to see this nice lady again. Please page me if need to discuss  [phone]. I will try to get first chemo set up at [facility] for next week. Time spent 90 min  [place] February 16, 2015, 2:39 PM

## 2015-02-16 NOTE — Progress Notes (Signed)
ANTICOAGULATION CONSULT NOTE - f/u Consult  Pharmacy Consult for Heparin Indication: DVT  No Known Allergies  Patient Measurements: Height: 5\' 2"  (157.5 cm) Weight: 180 lb 3.2 oz (81.738 kg) IBW/kg (Calculated) : 50.1   Vital Signs: Temp: 98.3 F (36.8 C) (02/15 0604) Temp Source: Oral (02/15 0604) BP: 167/79 mmHg (02/15 0604) Pulse Rate: 63 (02/15 0604)  Labs:  Recent Labs  02/14/15 0556 02/15/15 0519 02/15/15 2131 02/16/15 0455  HGB  --  6.7*  --  9.2*  HCT  --  22.6*  --  29.6*  PLT  --  367  --  373  APTT  --   --  38* 60*  HEPARINUNFRC 0.52  --  >2.20* >2.20*  CREATININE 0.93 1.06*  --  0.98    Estimated Creatinine Clearance: 59.7 mL/min (by C-G formula based on Cr of 0.98).   Medical History: Past Medical History  Diagnosis Date  . History of radiation therapy 10/21/08-11/23/08 & 12/07/10/13/12/23 2010    ENDOMETRIOID   . Uterine cancer (HCC)     ENDOMETRIAL CA& OVARY CANCER  . Hypertension      Assessment:  60 yr female with h/o uterine cancer presents with left leg swelling/pain and knot to groin  Abdominal CT shows evidence of recurrence of neoplastic disease and acute DVT of LLE.   Pt initially started on heparin drip, underwent biopsy and transitioned to Eliquis on 2/13  MD notes possible further procedures and has d/c'ed Eliquis and consulted pharmacy to resume IV heparin  Will check baseline aPTT and heparin level prior to starting heparin to determine which to titrate heparin from as Eliquis just recently started.  If Heparin level < 0.1 & aPTT < 47, will use only heparin level in the future.  If HL > 0.1 and does not correlate with aPTT, will titrate according to aPTT until both labs correlate.  Last dose of Eliquis was charted as given on 2/14 @ 10:43.  IV heparin to not begin until > 12 hr after Eliquis dose Today, 2/15  0455 HL=>2.20 and aPtt=60 sec, no IV interuptions or bleeding noted per RN.  Going for bronch today time  unclear.  HL still elevated secondary to Eliquis doses, will only use aPtt to adjust heparin drip for now.    Goal of Therapy:  Heparin Level = 0.3-0.7  APTT  = 66-102 sec   Plan:   Increase heparin drip to 1500 units/hr   Check aPTT in 6 hours  Follow Heparin level & CBC daily  Follow aPTT to guide heparin dosing until aPTT and HL correlate  Going for bronch today, time unclear  Dorrene German 02/16/2015, 6:15 AM

## 2015-02-16 NOTE — Progress Notes (Signed)
ANTICOAGULATION CONSULT NOTE - Initial Consult  Pharmacy Consult for eliquis Indication: DVT  No Known Allergies  Patient Measurements: Height: 5\' 2"  (157.5 cm) Weight: 180 lb 3.2 oz (81.738 kg) IBW/kg (Calculated) : 50.1   Vital Signs: Temp: 98.3 F (36.8 C) (02/15 0604) Temp Source: Oral (02/15 0604) BP: 167/79 mmHg (02/15 0604) Pulse Rate: 63 (02/15 0604)  Labs:  Recent Labs  02/14/15 0556 02/15/15 0519 02/15/15 2131 02/16/15 0455  HGB  --  6.7*  --  9.2*  HCT  --  22.6*  --  29.6*  PLT  --  367  --  373  APTT  --   --  38* 60*  HEPARINUNFRC 0.52  --  >2.20* >2.20*  CREATININE 0.93 1.06*  --  0.98    Estimated Creatinine Clearance: 59.7 mL/min (by C-G formula based on Cr of 0.98).   Medical History: Past Medical History  Diagnosis Date  . History of radiation therapy 10/21/08-11/23/08 & 12/07/10/13/12/23 2010    ENDOMETRIOID   . Uterine cancer (HCC)     ENDOMETRIAL CA& OVARY CANCER  . Hypertension      Assessment: 60 yr female with h/o uterine cancer presents with left leg swelling/pain and knot to groin.  Abdominal CT shows evidence of recurrence of neoplastic disease and acute DVT of LLE. Pt started on heparin 2/11, then changed to Eliquis 2/13 but had to be transitioned back to heparin 2/14 with possible bronchoscopy.  Pulmonary feels bronch not necessary, OK to switch to Eliquis for VTE treatment.  Goal of Therapy:  eliquis per renal function and indication   Plan:   Stop heparin at 1400 today  At 1400 today, start Eliquis 10 mg po twice daily for 7 days then take  5mg  twice daily thereafter.  Education already provided to patient  Reuel Boom, PharmD, BCPS Pager: [phone] 02/16/2015, 1:04 PM

## 2015-02-17 LAB — CBC
HEMATOCRIT: 31 % — AB (ref 36.0–46.0)
Hemoglobin: 9.6 g/dL — ABNORMAL LOW (ref 12.0–15.0)
MCH: 23.3 pg — ABNORMAL LOW (ref 26.0–34.0)
MCHC: 31 g/dL (ref 30.0–36.0)
MCV: 75.2 fL — ABNORMAL LOW (ref 78.0–100.0)
PLATELETS: 369 10*3/uL (ref 150–400)
RBC: 4.12 MIL/uL (ref 3.87–5.11)
RDW: 20.6 % — AB (ref 11.5–15.5)
WBC: 12.2 10*3/uL — AB (ref 4.0–10.5)

## 2015-02-17 LAB — BASIC METABOLIC PANEL
ANION GAP: 12 (ref 5–15)
BUN: 18 mg/dL (ref 6–20)
CALCIUM: 9.9 mg/dL (ref 8.9–10.3)
CO2: 28 mmol/L (ref 22–32)
Chloride: 100 mmol/L — ABNORMAL LOW (ref 101–111)
Creatinine, Ser: 1.03 mg/dL — ABNORMAL HIGH (ref 0.44–1.00)
GFR, EST NON AFRICAN AMERICAN: 57 mL/min — AB (ref >60)
Glucose, Bld: 140 mg/dL — ABNORMAL HIGH (ref 65–99)
Potassium: 4.4 mmol/L (ref 3.5–5.1)
Sodium: 140 mmol/L (ref 135–145)

## 2015-02-17 MED ORDER — MORPHINE SULFATE 15 MG PO TABS: 15.0000 mg | ORAL_TABLET | ORAL | Status: DC | PRN

## 2015-02-17 MED ORDER — APIXABAN 5 MG PO TABS: 10.0000 mg | ORAL_TABLET | Freq: Two times a day (BID) | ORAL | Status: DC

## 2015-02-17 MED ORDER — FERROUS SULFATE 325 (65 FE) MG PO TABS: 325.0000 mg | ORAL_TABLET | Freq: Two times a day (BID) | ORAL | Status: DC

## 2015-02-17 MED ORDER — ONDANSETRON HCL 4 MG PO TABS: 4.0000 mg | ORAL_TABLET | Freq: Four times a day (QID) | ORAL | Status: DC | PRN

## 2015-02-17 MED ORDER — APIXABAN 5 MG PO TABS: 5.0000 mg | ORAL_TABLET | Freq: Two times a day (BID) | ORAL | Status: DC

## 2015-02-17 MED ORDER — LABETALOL HCL 100 MG PO TABS: 100.0000 mg | ORAL_TABLET | Freq: Three times a day (TID) | ORAL | Status: DC

## 2015-02-17 MED ORDER — ZOLPIDEM TARTRATE 5 MG PO TABS: 5.0000 mg | ORAL_TABLET | Freq: Every evening | ORAL | Status: DC | PRN

## 2015-02-17 NOTE — Discharge Summary (Addendum)
Physician Discharge Summary  DREXEL ALAVEZ [id number] DOB: 08-May-1955 DOA: 02/12/2015  PCP: will follow up in [facility] on discharge   Admit date: 02/12/2015 Discharge date: 02/17/2015  Recommendations for Outpatient Follow-up:  Take apixaban 10 mg twice a day through 02/22/2015 and then continue 5 mg twice daily starting 02/23/2015 indefinitely.  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 She presented with a three-day history of increasing left lower extremity swelling and pain and a three-month history of a large knot in the right groin area.  In the emergency department, she was found to have an acute DVT of the left lower extremity and multiple enlarged lymph nodes, numerous pulmonary nodules up to 2.4 cm and a left psoas muscle mass concerning for recurrent cancer.Cytology of one lymph node positive for metastatic adenocarcinoma.  Hospital Course:   Assessment/Plan:    Principal Problem: Acute left lower extremity DVT / Leukocytosis  - Continue Eliquis as noted above 10 mg twice daily through 02/22/2015 and then 5 mg twice daily indefinitely  Active Problems: Enlarged lymph nodes, pulmonary nodules, and left psoas mass - Concerning for recurrence of malignancy (either ovarian or endometrial) however CT scan demonstrates a possible pulmonary  primary with a large left upper lobe mass that is obstructing the lingular bronchus - She had LN biopsy consistent with metastatic adenocarcinoma - Appreciate Dr. Hans Eden recommendations - Outpatient Port-A-Cath placement  Hypokalemia - Due to Hctz - Supplemented   Essential hypertension  - Continue clonidine 3 times a day - Continue labetalol TID - Continue HCTZ  Iron deficiency anemia and anemia of chronic disease - Due to malignancy - Had had 2 U RBC transfusion - Continue iron supplementation  Severe protein calorie malnutrition - In the context of chronic illness - Eating well     DVT Prophylaxis  - Continue apixaban    Code Status: Full.  Family Communication: plan of care discussed with the patient   IV access:  Peripheral IV  Procedures and diagnostic studies:   Ct Chest W Contrast 02/15/2015 1. Large mass approximating 7 cm in the central left upper lobe with extension into the left hilum and into the mediastinum, occluding the lingular bronchus. While this may represent metastatic disease from the prior gynecologic malignancy, it is worrisome for a primary pulmonary malignancy. 2. Multiple bilateral pulmonary parenchymal metastases, the next largest mass approximating 5 cm in the left lower lobe. 3. Pathologic mediastinal lymphadenopathy. 4. Approximate 1.8 cm nodule arising from the right side of the thyroid isthmus. Given the patient's comorbidities, further imaging evaluation is not felt necessary. This follows ACR consensus guidelines: Managing Incidental Thyroid Nodules Detected on Imaging: White Paper of the ACR Incidental Thyroid Findings Committee. J Am Coll Radiol 2015; 12:143-150. Electronically Signed By: Evangeline Dakin M.D. On: 02/15/2015 10:28   Ct Abdomen Pelvis W Contrast 02/12/2015 Status post hysterectomy. 8.5 x 7.1 cm necrotic right inguinal node, corresponding to the  palpable abnormality, suspicious for nodal metastasis.  Additional retroperitoneal/ left pelvic nodal metastases, as above. 6.1 cm low-density lesion in the left psoas muscle, favored to reflect tumor, less likely infection. Secondary involvement of the left L4 and L5 vertebral bodies. Associated acute deep venous thrombosis involving the left external iliac vein and extending into the left lower extremity, likely secondary to extrinsic compression by pelvic tumor. Pulmonary nodules/metastases at the lung bases, measuring up to 2.4 cm in the left lower lobe. These results were called by telephone at the time of interpretation on 02/12/2015 at 9:37 pm to Dr. Lacretia Leigh, who verbally acknowledged these results. Electronically Signed By: Julian Hy M.D. On: 02/12/2015 21:39   US Biopsy 02/15/2015 Aspirate and core biopsy performed of enlarged and necrotic right inguinal lymph node. Aspirated fluid was sent for cytologic analysis. Solid tissue was sent for histopathologic analysis. Electronically Signed By: Aletta Edouard M.D. On: 02/15/2015 07:44   US Aspiration 02/15/2015 Aspirate and core biopsy performed of enlarged and necrotic right inguinal lymph node. Aspirated fluid was sent for cytologic analysis. Solid tissue was sent for histopathologic analysis.   Medical Consultants:  GYN-onc IR  IAnti-Infectives:   None   Signed:  Leisa Lenz, MD  [facility] 02/17/2015, 10:32 AM  Pager #: [phone]  Time spent in minutes: less than 30 minutes   Discharge Exam: Filed Vitals:   02/16/15 2228 02/17/15 0627  BP: 176/76 154/64  Pulse: 65 71  Temp: 98.4 F (36.9 C) 98.6 F (37 C)  Resp: 18    Filed Vitals:   02/16/15 0604 02/16/15 1454 02/16/15 2228 02/17/15 0627  BP: 167/79 154/59 176/76 154/64  Pulse: 63 64 65 71  Temp: 98.3 F (36.8 C) 98.7 F (37.1 C) 98.4 F (36.9 C) 98.6 F (37 C)  TempSrc: Oral Oral Oral Oral  Resp: 20 18 18    Height:      Weight:      SpO2: 96% 96% 94% 94%    General: Pt is  alert, follows commands appropriately, not in acute distress Cardiovascular: Regular rate and rhythm, S1/S2 +, no murmurs Respiratory: Clear to auscultation bilaterally, no wheezing, no crackles, no rhonchi Abdominal: Soft, non tender, non distended, bowel sounds +, no guarding Extremities: no cyanosis, pulses palpable bilaterally DP and PT Neuro: Grossly nonfocal  Discharge Instructions  Discharge Instructions    Call MD for:  difficulty breathing, headache or visual disturbances    Complete by:  As directed      Call MD for:  persistant dizziness or light-headedness    Complete by:  As directed      Call MD for:  persistant nausea and vomiting    Complete by:  As directed      Call MD for:  severe uncontrolled pain    Complete by:  As directed      Diet - low sodium heart healthy    Complete by:  As directed      Discharge instructions    Complete by:  As directed   Take apixaban 10 mg twice a day through 02/22/2015 and then continue 5 mg twice daily starting 02/23/2015 indefinitely.  Continue lipase level, clonidine and hydrochlorothiazide for blood pressure control     Increase activity slowly    Complete by:  As directed             Medication List    STOP taking these medications        naproxen sodium 220 MG tablet  Commonly known  as:  ANAPROX      TAKE these medications        apixaban 5 MG Tabs tablet  Commonly known as:  ELIQUIS  Take 2 tablets (10 mg total) by mouth 2 (two) times daily.     apixaban 5 MG Tabs tablet  Commonly known as:  ELIQUIS  Take 1 tablet (5 mg total) by mouth 2 (two) times daily.  Start taking on:  02/23/2015     CATAPRES 0.1 MG tablet  Generic drug:  cloNIDine  Take 0.1 mg by mouth at bedtime.     ferrous sulfate 325 (65 FE) MG tablet  Take 1 tablet (325 mg total) by mouth 2 (two) times daily with a meal.     hydrochlorothiazide 25 MG tablet  Commonly known as:  HYDRODIURIL  Take 25 mg by mouth daily.     labetalol 100 MG  tablet  Commonly known as:  NORMODYNE  Take 1 tablet (100 mg total) by mouth 3 (three) times daily.     morphine 15 MG tablet  Commonly known as:  MSIR  Take 1 tablet (15 mg total) by mouth every 4 (four) hours as needed for moderate pain or severe pain.     ondansetron 4 MG tablet  Commonly known as:  ZOFRAN  Take 1 tablet (4 mg total) by mouth every 6 (six) hours as needed for nausea.     zolpidem 5 MG tablet  Commonly known as:  AMBIEN  Take 1 tablet (5 mg total) by mouth at bedtime as needed for sleep.           Follow-up Information    Follow up with [place] On 02/21/2015.   Why:  Appointment at 9:00 AM on Monday. Please keep appointment.   Contact information:   [address] [place] [phone]       The results of significant diagnostics from this hospitalization (including imaging, microbiology, ancillary and laboratory) are listed below for reference.    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 Visualized lung bases on CT abdomen and pelvis 3 days ago demonstrated multiple pulmonary nodules. CT chest requested for complete evaluation. EXAM: CT CHEST WITH CONTRAST TECHNIQUE: Multidetector CT imaging of the chest was performed during intravenous contrast administration. CONTRAST:  2mL OMNIPAQUE IOHEXOL 300 MG/ML IV. COMPARISON:  Visualized lung windows all CT abdomen and pelvis 02/12/2015. CT chest 03/25/2008. FINDINGS: Cardiovascular: Cardiac silhouette mildly enlarged, unchanged. No visible coronary atherosclerosis. No significant pericardial effusion. Moderate atherosclerosis involving the thoracic aorta without evidence of aneurysm. Bovine aortic arch anatomy (left common  carotid artery arises from the innominate artery). Mediastinum/Lymph Nodes: Numerous enlarged mediastinal lymph nodes. An AP window (station 5) node measures approximately 4.0 x 3.2 x 5.4 cm. An index low right paratracheal (station 4R) node measures approximately 2.4 x 2.1 x 2.7 cm. Left hilar nodal mass extending into the mediastinum and contiguous with a a mass in the central left upper lobe, measured below. No pathologic right hilar lymphadenopathy. No pathologic axillary lymphadenopathy. Small hiatal hernia. Normal-appearing esophagus. Approximate 1.8 cm nodule arising from the right side of the thyroid isthmus, best seen on the coronal images. Lungs/Pleura: Innumerable nodules throughout both lungs. Large mass centrally in the left upper lobe, contiguous with left hilar  lymphadenopathy and with extension into the mediastinum, approximate measurements 6.2 x 6.7 x 6.9 cm (measured on soft tissue window images). The mass occludes the lingular bronchus. A mass in the posterior left lower lobe measures approximately 3.5 x 3.8 x 4.7 cm. No pleural masses.  No pleural effusions. Upper abdomen: Please see the report of the CT abdomen and pelvis performed 3 days ago. Musculoskeletal: Mild degenerative disc disease and spondylosis involving the upper and mid thoracic spine. No evidence of osseous metastatic disease. IMPRESSION: 1. Large mass approximating 7 cm in the central left upper lobe with extension into the left hilum and into the mediastinum, occluding the lingular bronchus. While this may represent metastatic disease from the prior gynecologic malignancy, it is worrisome for a primary pulmonary malignancy. 2. Multiple bilateral pulmonary parenchymal metastases, the next largest mass approximating 5 cm in the left lower lobe. 3. Pathologic mediastinal lymphadenopathy. 4. Approximate 1.8 cm nodule arising from the right side of the thyroid isthmus. Given the patient's comorbidities, further imaging evaluation is  not felt necessary. This follows ACR consensus guidelines: Managing Incidental Thyroid Nodules Detected on Imaging: White Paper of the ACR Incidental Thyroid Findings Committee. J Am Coll Radiol 2015; 12:143-150. Electronically Signed   By: Evangeline Dakin M.D.   On: 02/15/2015 10:28   Ct Abdomen Pelvis W Contrast  02/12/2015  CLINICAL DATA:  Back and flank pain, left leg swelling/ pain, palpable abnormality in right groin, history of uterine cancer status post XRT EXAM: CT ABDOMEN AND PELVIS WITH CONTRAST TECHNIQUE: Multidetector CT imaging of the abdomen and pelvis was performed using the standard protocol following bolus administration of intravenous contrast. CONTRAST:  154mL OMNIPAQUE IOHEXOL 300 MG/ML  SOLN COMPARISON:  12/06/2009 FINDINGS: Lower chest: 2.4 x 1.9 cm left lower lobe nodule (series 6/ image 3), suspicious for metastasis. Additional subcentimeter nodules in the visualized lung bases. Hepatobiliary: Liver is within normal limits. No suspicious/enhancing hepatic lesions. Gallbladder is unremarkable. No intrahepatic or extrahepatic ductal dilatation. Pancreas: Within normal limits. Spleen: Within normal limits. Adrenals/Urinary Tract: Adrenal glands are within normal limits. 5.8 cm cyst in the lateral right lower kidney (series 2/ image 35). Additional 10 mm lateral right lower pole renal cyst (series 2/image 28). 6 mm nonobstructing right upper pole renal calculus (series 2/ image 26). No hydronephrosis. Left kidney is within normal limits. Bladder is within normal limits. Stomach/Bowel: Stomach is notable for a small hiatal hernia. No evidence of bowel obstruction. Normal appendix (series 2/image 55). Vascular/Lymphatic: No evidence of abdominal aortic aneurysm. Thrombus within the left external iliac vein (series 2/image 56) extending into the left common femoral vein (series 2/ image 68) and distally into the left lower extremity (series 2/image 81), likely secondary to extrinsic compression  by pelvic tumor. Retroperitoneal/pelvic nodal metastases, including: --1.8 cm short axis left para-aortic node (series 2/ image 29) --1.0 cm short axis inferior aortocaval node (series 2/image 39) --2.7 cm short axis necrotic left external iliac node (series 2/image 63) --8.5 x 7.1 cm necrotic right inguinal node (series 2/image 33), corresponding the palpable abnormality Reproductive: Status post hysterectomy. Bilateral ovaries are favored to be surgically absent. Other: No abdominopelvic ascites. Tiny fat containing periumbilical hernia (series 2/ image 102). Musculoskeletal: 4.7 x 5.2 x 6.1 cm low-density lesion involving the left psoas muscle (series 2/ image 47), favored to reflect tumor, less likely infection. Associated sclerosis involving the left aspect of the L4 and L5 vertebral bodies (series 2/ images 43 and 47), favored to reflect osseous metastases, less likely infection.  IMPRESSION: Status post hysterectomy. 8.5 x 7.1 cm necrotic right inguinal node, corresponding to the palpable abnormality, suspicious for nodal metastasis. Additional retroperitoneal/ left pelvic nodal metastases, as above. 6.1 cm low-density lesion in the left psoas muscle, favored to reflect tumor, less likely infection. Secondary involvement of the left L4 and L5 vertebral bodies. Associated acute deep venous thrombosis involving the left external iliac vein and extending into the left lower extremity, likely secondary to extrinsic compression by pelvic tumor. Pulmonary nodules/metastases at the lung bases, measuring up to 2.4 cm in the left lower lobe. These results were called by telephone at the time of interpretation on 02/12/2015 at 9:37 pm to Dr. Lacretia Leigh, who verbally acknowledged these results. Electronically Signed   By: Julian Hy M.D.   On: 02/12/2015 21:39   US Biopsy  02/15/2015  CLINICAL DATA:  History of endometrial and ovarian malignancy with large necrotic right inguinal lymph node, pelvic  lymphadenopathy and additional retroperitoneal lymphadenopathy by CT. EXAM: ULTRASOUND GUIDED NEEDLE ASPIRATE AND CORE BIOPSY OF RIGHT INGUINAL LYMPH NODE COMPARISON:  CT of the abdomen and pelvis on 02/12/2015. MEDICATIONS: 2.0 mg IV Versed; 75 mcg IV Fentanyl Total Moderate Sedation Time: 20 minutes. The patient's level of consciousness and physiologic status were continuously monitored during the procedure by Radiology nursing. PROCEDURE: The procedure, risks, benefits, and alternatives were explained to the patient. Questions regarding the procedure were encouraged and answered. The patient understands and consents to the procedure. A time-out was performed prior to the procedure. The right inguinal region was prepped with chlorhexidine in a sterile fashion, and a sterile drape was applied covering the operative field. A sterile gown and sterile gloves were used for the procedure. Local anesthesia was provided with 1% Lidocaine. Initially, liquefied component of a necrotic right inguinal lymph node was aspirated via a Yueh centesis catheter. Aspirated fluid was sent for cytologic analysis. Core biopsy was performed of the solid component of the enlarged right inguinal lymph node with a 16 gauge core needle device. A total of 5 core samples were obtained and submitted in saline. Additional sonographic imaging was performed following biopsy. COMPLICATIONS: None. FINDINGS: Massively enlarged and partially necrotic right inguinal lymph node identified by ultrasound measuring approximately 8.5 cm in greatest diameter. Initial aspiration of a more superficial fluid component yielded 60 mL of dark brown fluid. This resulted in significant decompression of the fluid component. Significant solid component of the lymph node is also present with distorted and heterogeneous soft tissue present. Core biopsy yielded solid tissue. IMPRESSION: Aspirate and core biopsy performed of enlarged and necrotic right inguinal lymph  node. Aspirated fluid was sent for cytologic analysis. Solid tissue was sent for histopathologic analysis. Electronically Signed   By: Aletta Edouard M.D.   On: 02/15/2015 07:44   US Aspiration  02/15/2015  CLINICAL DATA:  History of endometrial and ovarian malignancy with large necrotic right inguinal lymph node, pelvic lymphadenopathy and additional retroperitoneal lymphadenopathy by CT. EXAM: ULTRASOUND GUIDED NEEDLE ASPIRATE AND CORE BIOPSY OF RIGHT INGUINAL LYMPH NODE COMPARISON:  CT of the abdomen and pelvis on 02/12/2015. MEDICATIONS: 2.0 mg IV Versed; 75 mcg IV Fentanyl Total Moderate Sedation Time: 20 minutes. The patient's level of consciousness and physiologic status were continuously monitored during the procedure by Radiology nursing. PROCEDURE: The procedure, risks, benefits, and alternatives were explained to the patient. Questions regarding the procedure were encouraged and answered. The patient understands and consents to the procedure. A time-out was performed prior to the procedure. The right inguinal  region was prepped with chlorhexidine in a sterile fashion, and a sterile drape was applied covering the operative field. A sterile gown and sterile gloves were used for the procedure. Local anesthesia was provided with 1% Lidocaine. Initially, liquefied component of a necrotic right inguinal lymph node was aspirated via a Yueh centesis catheter. Aspirated fluid was sent for cytologic analysis. Core biopsy was performed of the solid component of the enlarged right inguinal lymph node with a 16 gauge core needle device. A total of 5 core samples were obtained and submitted in saline. Additional sonographic imaging was performed following biopsy. COMPLICATIONS: None. FINDINGS: Massively enlarged and partially necrotic right inguinal lymph node identified by ultrasound measuring approximately 8.5 cm in greatest diameter. Initial aspiration of a more superficial fluid component yielded 60 mL of dark  brown fluid. This resulted in significant decompression of the fluid component. Significant solid component of the lymph node is also present with distorted and heterogeneous soft tissue present. Core biopsy yielded solid tissue. IMPRESSION: Aspirate and core biopsy performed of enlarged and necrotic right inguinal lymph node. Aspirated fluid was sent for cytologic analysis. Solid tissue was sent for histopathologic analysis. Electronically Signed   By: Aletta Edouard M.D.   On: 02/15/2015 07:44    Microbiology: No results found for this or any previous visit (from the past 240 hour(s)).   Labs: Basic Metabolic Panel:  Recent Labs Lab 02/13/15 0127 02/14/15 0556 02/15/15 0519 02/16/15 0455 02/16/15 1455 02/17/15 0448  NA 138 135 138 136 139 140  K 3.7 3.7 4.1 4.3 4.0 4.4  CL 102 99* 101 100* 102 100*  CO2 25 26 28 26 28 28   GLUCOSE 117* 116* 124* 129* 132* 140*  BUN 11 16 15 15 13 18   CREATININE 0.73 0.93 1.06* 0.98 0.90 1.03*  CALCIUM 8.9 8.9 9.0 9.3 9.5 9.9  MG 1.9  --   --   --   --   --    Liver Function Tests:  Recent Labs Lab 02/16/15 1455  AST 15  ALT 15  ALKPHOS 90  BILITOT 0.5  PROT 7.2  ALBUMIN 2.8*   No results for input(s): LIPASE, AMYLASE in the last 168 hours. No results for input(s): AMMONIA in the last 168 hours. CBC:  Recent Labs Lab 02/12/15 1615 02/13/15 0127 02/15/15 0519 02/16/15 0455 02/17/15 0448  WBC 10.6* 13.9* 9.5 11.2* 12.2*  HGB 8.4* 7.6* 6.7* 9.2* 9.6*  HCT 27.8* 25.2* 22.6* 29.6* 31.0*  MCV 70.4* 70.6* 71.1* 74.6* 75.2*  PLT 404* 379 367 373 369   Cardiac Enzymes: No results for input(s): CKTOTAL, CKMB, CKMBINDEX, TROPONINI in the last 168 hours. BNP: BNP (last 3 results) No results for input(s): BNP in the last 8760 hours.  ProBNP (last 3 results) No results for input(s): PROBNP in the last 8760 hours.  CBG: No results for input(s): GLUCAP in the last 168 hours.

## 2015-02-18 ENCOUNTER — Telehealth: Payer: Self-pay | Admitting: Oncology

## 2015-02-18 ENCOUNTER — Other Ambulatory Visit: Payer: Self-pay | Admitting: Oncology

## 2015-02-18 NOTE — Telephone Encounter (Signed)
Lt mess regarding hospital f/u appointment for this coming Monday 2/20

## 2015-02-19 NOTE — Telephone Encounter (Signed)
Appointments complete per 2/15 pof. Patient contacted via HIM re appointments for 2/20 and will get schedule at 2/20 appointments. Left a 2nd message for patient today confirming 2/20 appointments.

## 2015-02-20 ENCOUNTER — Other Ambulatory Visit: Payer: Self-pay | Admitting: Oncology

## 2015-02-20 DIAGNOSIS — C541 Malignant neoplasm of endometrium: Secondary | ICD-10-CM

## 2015-02-21 ENCOUNTER — Ambulatory Visit (HOSPITAL_BASED_OUTPATIENT_CLINIC_OR_DEPARTMENT_OTHER): Payer: Self-pay

## 2015-02-21 ENCOUNTER — Encounter: Payer: Self-pay | Admitting: General Practice

## 2015-02-21 ENCOUNTER — Inpatient Hospital Stay: Payer: Self-pay

## 2015-02-21 ENCOUNTER — Other Ambulatory Visit: Payer: Self-pay

## 2015-02-21 ENCOUNTER — Ambulatory Visit (HOSPITAL_BASED_OUTPATIENT_CLINIC_OR_DEPARTMENT_OTHER): Payer: Self-pay | Admitting: Oncology

## 2015-02-21 ENCOUNTER — Telehealth: Payer: Self-pay | Admitting: Oncology

## 2015-02-21 VITALS — BP 211/84 | HR 98 | Temp 97.7°F | Resp 18 | Ht 62.0 in | Wt 175.4 lb

## 2015-02-21 DIAGNOSIS — Z8543 Personal history of malignant neoplasm of ovary: Secondary | ICD-10-CM

## 2015-02-21 DIAGNOSIS — C801 Malignant (primary) neoplasm, unspecified: Secondary | ICD-10-CM

## 2015-02-21 DIAGNOSIS — I82402 Acute embolism and thrombosis of unspecified deep veins of left lower extremity: Secondary | ICD-10-CM

## 2015-02-21 DIAGNOSIS — K5903 Drug induced constipation: Secondary | ICD-10-CM

## 2015-02-21 DIAGNOSIS — Z9114 Patient's other noncompliance with medication regimen: Secondary | ICD-10-CM

## 2015-02-21 DIAGNOSIS — Z8542 Personal history of malignant neoplasm of other parts of uterus: Secondary | ICD-10-CM

## 2015-02-21 DIAGNOSIS — I878 Other specified disorders of veins: Secondary | ICD-10-CM

## 2015-02-21 DIAGNOSIS — T451X5A Adverse effect of antineoplastic and immunosuppressive drugs, initial encounter: Secondary | ICD-10-CM

## 2015-02-21 DIAGNOSIS — I10 Essential (primary) hypertension: Secondary | ICD-10-CM

## 2015-02-21 DIAGNOSIS — C569 Malignant neoplasm of unspecified ovary: Secondary | ICD-10-CM

## 2015-02-21 DIAGNOSIS — G893 Neoplasm related pain (acute) (chronic): Secondary | ICD-10-CM

## 2015-02-21 DIAGNOSIS — D509 Iron deficiency anemia, unspecified: Secondary | ICD-10-CM

## 2015-02-21 DIAGNOSIS — G620 Drug-induced polyneuropathy: Secondary | ICD-10-CM

## 2015-02-21 DIAGNOSIS — Z23 Encounter for immunization: Secondary | ICD-10-CM

## 2015-02-21 DIAGNOSIS — D500 Iron deficiency anemia secondary to blood loss (chronic): Secondary | ICD-10-CM

## 2015-02-21 DIAGNOSIS — Z7901 Long term (current) use of anticoagulants: Secondary | ICD-10-CM

## 2015-02-21 DIAGNOSIS — C541 Malignant neoplasm of endometrium: Secondary | ICD-10-CM

## 2015-02-21 LAB — COMPREHENSIVE METABOLIC PANEL
ALT: 13 U/L (ref 0–55)
ANION GAP: 13 meq/L — AB (ref 3–11)
AST: 10 U/L (ref 5–34)
Albumin: 2.9 g/dL — ABNORMAL LOW (ref 3.5–5.0)
Alkaline Phosphatase: 118 U/L (ref 40–150)
BILIRUBIN TOTAL: 0.58 mg/dL (ref 0.20–1.20)
BUN: 11 mg/dL (ref 7.0–26.0)
CALCIUM: 10.2 mg/dL (ref 8.4–10.4)
CO2: 24 mEq/L (ref 22–29)
CREATININE: 0.9 mg/dL (ref 0.6–1.1)
Chloride: 98 mEq/L (ref 98–109)
EGFR: 83 mL/min/{1.73_m2} — ABNORMAL LOW (ref >90)
Glucose: 105 mg/dl (ref 70–140)
Potassium: 3.9 mEq/L (ref 3.5–5.1)
Sodium: 135 mEq/L — ABNORMAL LOW (ref 136–145)
TOTAL PROTEIN: 8.2 g/dL (ref 6.4–8.3)

## 2015-02-21 LAB — CBC WITH DIFFERENTIAL/PLATELET
BASO%: 0.8 % (ref 0.0–2.0)
Basophils Absolute: 0.1 10*3/uL (ref 0.0–0.1)
EOS ABS: 0.3 10*3/uL (ref 0.0–0.5)
EOS%: 2 % (ref 0.0–7.0)
HEMATOCRIT: 33.7 % — AB (ref 34.8–46.6)
HGB: 10.7 g/dL — ABNORMAL LOW (ref 11.6–15.9)
LYMPH#: 1 10*3/uL (ref 0.9–3.3)
LYMPH%: 7.3 % — ABNORMAL LOW (ref 14.0–49.7)
MCH: 23.1 pg — ABNORMAL LOW (ref 25.1–34.0)
MCHC: 31.6 g/dL (ref 31.5–36.0)
MCV: 73.1 fL — ABNORMAL LOW (ref 79.5–101.0)
MONO#: 1.1 10*3/uL — AB (ref 0.1–0.9)
MONO%: 8.2 % (ref 0.0–14.0)
NEUT%: 81.7 % — AB (ref 38.4–76.8)
NEUTROS ABS: 10.7 10*3/uL — AB (ref 1.5–6.5)
PLATELETS: 411 10*3/uL — AB (ref 145–400)
RBC: 4.61 10*6/uL (ref 3.70–5.45)
RDW: 24 % — ABNORMAL HIGH (ref 11.2–14.5)
WBC: 13 10*3/uL — ABNORMAL HIGH (ref 3.9–10.3)

## 2015-02-21 MED ORDER — INFLUENZA VAC SPLIT QUAD 0.5 ML IM SUSY
0.5000 mL | PREFILLED_SYRINGE | Freq: Once | INTRAMUSCULAR | Status: AC
  Administered 2015-02-21: 0.5 mL via INTRAMUSCULAR
  Filled 2015-02-21: qty 0.5

## 2015-02-21 NOTE — Telephone Encounter (Signed)
appt made and avs printed °

## 2015-02-21 NOTE — Progress Notes (Signed)
OFFICE PROGRESS NOTE   February 22, 2015   Physicians: Everitt Amber   (no PCP)  INTERVAL HISTORY:  Patient is seen, together with family member, in follow up of hospital consultation 02-16-15 for newly diagnosed recurrent gyn carcinoma, with 8.5 cm right inguinal node, numerous pulmonary nodules, retroperitoneal, pelvic and chest adenopathy and likely involvement in left psoas to L4-5. Situation is complicated by extensive LLE DVT found at that admission, now on Eliquis by a 30 day assistance program.  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 CA 125 was 61 proiro to surgery in 03-2008. She had one episode of bowel obstruction treated conservatively at [facility] shortly following the initial surgery. See note scanned into this EMR 09-16-08 from Dr Nancy Marus. She has not been seen by med onc or rad onc since Dr Sondra Come in 11-2010 per this EMR.  Per patient, she has not seen any physician in past 5 years until presenting to ED on 02-12-15. CT AP 02-12-15 and CT chest 02-15-15 had findings as above as well as DVT left external iliac/ left common femoral/ distally into LLE. She had aspiration of 60 cc dark fluid + core needle biopsy from the right inguinal mass 02-15-15, pathology showing adenocarcinoma with immunostains consistent with gyn primary. She was transfused 2 units PRBCs for hgb 6.7, and had first feraheme on 02-16-15 for serum iron 14/ %sat 7. She was seen in consultation by Dr Denman George in hospital; she has not been seen back by radiation oncology. Peripheral IV access is difficult, however timing with Eliquis and discharge home on 02-17-15 did not allow central line placement in hospital.   Patient did not take BP meds this AM; I have strongly encouraged her to take these daily (hydrodiuril, labetalol and hs clonidine).  LLE  swelling ~ same, tho not as sore as PTA. No bleeding, has been taking Eliquis. Per our conversation, patient does not know what she needs to do for additional Eliquis assistance beyond 30 day supply dispensed at hospital.  She has used MSIR about once daily, including x1 last pm for back pain. She has been constipated, using Epsom salts,  bowels finally moved well yesterday; I have asked her to start miralax 1-2x daily. Appetite has not been too good with the constipation, ate salad, noodles, oatmeal, water yesterday. No fever or symptoms of infection. Denies SOB with rather limited activity, no significant cough, no chest pain. No significant nausea and no vomiting. Less uncomfortable right inguinal mass since aspiration in hospital.   Prior chemo records requested, but not available as yet due to new EMR. Patient recalls nausea and constipation day 3 of each carbo taxol. Following last chemo she had increased neuropathy in feet which was very bothersome then, has resolved to present continuous tingling which is not otherwise interfering with activity. She never tried gabapentin.  Remainder of 10 point Review of Systems negative.   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 Complete records from 2010 requested from [facility].  Last follow up visit for the gyn cancers appears to have been with Dr Sondra Come in 11-2010, apparently lost to follow up since then.  Patient reports right inguinal mass for ~ 2 months, then LLE pain for ~ 3.5 weeks prior to presenting to ED on 02-12-15. She had recently made some changes in diet (a "Ileene Musa" thru her church with only  fruits and vegetables), but also appetite not great so that she lost "2 dress sizes" in past 1-2 months. Only pain was in right groin, this improved since necrotic nodal mass was aspirated for 60 cc dark fluid on 02-15-15. She denies fever or other symptoms of infection and had no bleeding PTA. She denies SOB, cough, chest pain, wheezing. She denies HA, other neurologic changes other than drowsiness with medication in hospital. Bowels have been moving, denies melanotic stools. Bladder asymptomatic, apparently no hematuria. Some nausea. Had been less active over last couple of weeks with LE discomfort. Peripheral IV access has not been easy in hospital. In ED on 02-12-15 CT AP showed nodules in lung bases suspicious for metastatic disease, liver not remarkable, no hydronephrosis, 6 mm right renal calculus, thrombus left external iliac vein into left common femoral vein, retroperitoneal and pelvic adenopathy, an 8.5 x 7.1 cm necrotic right inguinal node, post hyst ooph, no ascites, likely tumor involvement in left psoas with adjacent involvement of L4 and L5 vertebral bodies. Patient was admitted, begun on heparin qtt which was transitioned to Eliquis on 02-14-15. She had aspiration and core needle biopsy of right inguinal nodal mass on 02-15-15. CA 125 from 02-14-15 was 642.9, this having been 3.6 in 08-2010. She was seen in consultation by Dr Denman George on 02-14-15. Pathology finalized today shows metastatic adenocarcinoma with immunostains consistent with gyn primary ([id number]). She was transfused 2 units PRBCs for hgb 6.7, and begun on oral iron. Iron studies 2-12 with serum iron 14 and %sat 7. .  Objective:  Vital signs in last 24 hours:  BP 211/84 mmHg  Pulse 98  Temp(Src) 97.7 F (36.5 C) (Oral)  Resp 18  Ht 5' 2"  (1.575 m)  Wt 175 lb 6.4 oz (79.561 kg)  BMI 32.07 kg/m2  SpO2 100% Initial BP 195/107 Alert, oriented and appropriate. Ambulatory without assistance  No alopecia  HEENT:PERRL, sclerae not  icteric. Oral mucosa moist without lesions, posterior pharynx clear.  Neck supple. No JVD.  Lymphatics:no cervical or axillary adenopathy. Bilateral supraclavicular fullness. Large mass right inguinal area, not fluctuant or red/ hot, not tender to gentle exam. Resp: diminished BS bilaterally without wheezes or crackles Cardio: regular rate and rhythm. No gallop. GI: soft, nontender, mildly distended, no mass or organomegaly. A few bowel sounds. Surgical incision not remarkable. Musculoskeletal/ Extremities: LLE 2+ swelling to thigh, otherwise RLE, UE without pitting edema, cords, tenderness Neuro: Moderate peripheral neuropathy feet bilaterally. Otherwise nonfocal. PSYCH appropriate mood and affect Skin without rash, ecchymosis, petechiae   Lab Results:  Results for orders placed or performed in visit on 02/21/15  CBC with Differential  Result Value Ref Range   WBC 13.0 (H) 3.9 - 10.3 10e3/uL   NEUT# 10.7 (H) 1.5 - 6.5 10e3/uL   HGB 10.7 (L) 11.6 - 15.9 g/dL   HCT 33.7 (L) 34.8 - 46.6 %   Platelets 411 (H) 145 - 400 10e3/uL   MCV 73.1 (L) 79.5 - 101.0 fL   MCH 23.1 (L) 25.1 - 34.0 pg   MCHC 31.6 31.5 - 36.0 g/dL   RBC 4.61 3.70 - 5.45 10e6/uL  RDW 24.0 (H) 11.2 - 14.5 %   lymph# 1.0 0.9 - 3.3 10e3/uL   MONO# 1.1 (H) 0.1 - 0.9 10e3/uL   Eosinophils Absolute 0.3 0.0 - 0.5 10e3/uL   Basophils Absolute 0.1 0.0 - 0.1 10e3/uL   NEUT% 81.7 (H) 38.4 - 76.8 %   LYMPH% 7.3 (L) 14.0 - 49.7 %   MONO% 8.2 0.0 - 14.0 %   EOS% 2.0 0.0 - 7.0 %   BASO% 0.8 0.0 - 2.0 %  Comprehensive metabolic panel  Result Value Ref Range   Sodium 135 (L) 136 - 145 mEq/L   Potassium 3.9 3.5 - 5.1 mEq/L   Chloride 98 98 - 109 mEq/L   CO2 24 22 - 29 mEq/L   Glucose 105 70 - 140 mg/dl   BUN 11.0 7.0 - 26.0 mg/dL   Creatinine 0.9 0.6 - 1.1 mg/dL   Total Bilirubin 0.58 0.20 - 1.20 mg/dL   Alkaline Phosphatase 118 40 - 150 U/L   AST 10 5 - 34 U/L   ALT 13 0 - 55 U/L   Total Protein 8.2 6.4 - 8.3 g/dL    Albumin 2.9 (L) 3.5 - 5.0 g/dL   Calcium 10.2 8.4 - 10.4 mg/dL   Anion Gap 13 (H) 3 - 11 mEq/L   EGFR 83 (L) >90 ml/min/1.73 m2     Studies/Results:  No results found.  Medications: I have reviewed the patient's current medications. After patient's visit today, I have spoken directly with [facility] hospital case manager M.McGibboney [phone] re Eliquis, then with [facility] pharmacist [phone] re application for additional Eliquis beyond 30 day supply given from hospital, with information that the additional Eliquis application can take 4-5 weeks to process. Message to [facility] financial staff and pharmacist to follow up.  Note daughter is pediatrician, had been prescribing the clonidine and hydrodiuril, as patient refused to follow with other MDs.   Aloxi is included on chemo careplan now.   Miralax daily - bid  Needs one additional dose feraheme, which we will try to coordinate with MD visit after first chemo.    DISCUSSION Patient attended chemotherapy teaching class prior to my visit today, "information sounded very familiar" . I have been clear that this is not situation of potential cure, but that treatment will be given in attempt to improve and control disease as best possible.  With information now about significant residual neuropathy in feet from previous taxol, I have recommended that we change treatment to carboplatin + taxotere, tho still some chance of neuropathy from taxotere which could limit use of that agent also. RN to do teaching for taxotere additionally.  Discussed increased risk of reaction to carboplatin after >6 doses, will use skin test piror to each dose   Aloxi may give better control of nausea first few days after chemo. NOTE she has not met with [place] financial staff/ possibly medicaid eligible.  Meds discussed as above, needs to be sure bowels are moving well daily. Reminded her that pain medication fine if needed, does increase  constipation. Eliquis information available only after visit.   Patient understands that we will start chemo with peripheral IVs if possible,, otherwise could place PICC without stopping Eliquis. With extent of acute LLE DVT, I do not think appropriate to stop anticoagulation for [facility] placement yet. Encouraged patient to be very well hydrated when she comes to office, as this may help IV access.  She is aware that we may consider RT to right inguinal  mass, and possibly to left psoas/ LS area if needed, but will begin chemo this week as first intervention.  Discussed importance of flu vaccine given start of chemo and widespread influenza in community. Patient agreed after discussion (she noted that she has never had flu vaccine and has never had flu).   Assessment/Plan:  1.metastatic adenocarcinoma of gyn primary, in patient with history of IC clear cell ovarian and IA high grade endometrial carcinomas 03-2008, treated with incomplete staging, TAH BSO, adjuvant carboplatin taxol x 6 cycles and radiation. Present metastatic disease involves large necrotic right inguinal node, paraaortic/ aortocaval/ external iliac nodes, and multiple bilateral pulmonary nodes. 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 Will begin chemo with carbo taxotere on 02-24-15, with neulasta support on day 3. 2.LLE DVT documented 02-12-15: on Eliquis, leg less painful, swelling stable. No bleeding. Does not have IVC filter. [place] can get additional assistance for the Eliquis if needed. 3.severe iron deficiency anemia: etiology not clear. Does not seem to have had heme check stools in hospital (patient tells me this was done in ED, tho I have not located that information in EMR) and UA was negative for blood. Had first feraheme 02-16-15 per hospital pharmacist. Will give second dose at [facility] ~ next week. 4.difficult IV access: really needs PAC for chemo, however placement complicated by  Eliquis and acute, extensive LLE DVT. I do not think she should be off anticoagulation x 48 hours at this point, as is IR protocol for PAC and Eliquis. Will either need to manage with peripheral IVs to start + PAC later as outpatient, or possibly PICC. 5.overdue mammograms, general medical care, colonoscopy. Noncompliance with follow up after cancer diagnoses and treatment after ~ 2012 6.history HTN: needs PCP to follow. BP very elevated today as she did not take meds. Note clonidine only at hs (?) 7.recent weight loss, in part intentional. Discussed need for good nutrition 8.information on advance directives given 9.flu vaccine given today   All questions answered and patient has given verbal consent for treatent. Chemo orders changed to [place] taxotere with neulasta, carbo dose decreased to AUC=4 due to previous pelvic RT and ongoing anticoagulation. Fereheme ordered under order entry / sign and held for 03-03-15. I will see her with lab 3-2 and 3-13  [place] financial staff notified. Communication with RNs, hospital case manager, [facility] pharmacist, [facility] managed care, [facility] pharmacist, [facility] to get prior records. TIme spent 60 min including >50% counseling and coordination of care.  Zamariah Seaborn P, MD   02/22/2015, 9:55 AM

## 2015-02-21 NOTE — Progress Notes (Signed)
Spiritual Care Note  Met Ms Dauria and dtr in chemo class this morning, following up in lobby.  Pt was in good spirits, citing good support, not needing a portacath, and especially relief of anxiety upon learning more about dx/tx ("It's not as bad as I was afraid it was.").  She used opportunity to process her feelings of worry, relief, gratitude, and readiness to take action.  Pt/family aware of ongoing Support Team availability, but please also page as needs arise.  Thank you.  [place], [place], [facility] Pager [phone] Voicemail  [phone]

## 2015-02-22 ENCOUNTER — Other Ambulatory Visit: Payer: Self-pay | Admitting: Oncology

## 2015-02-22 ENCOUNTER — Encounter: Payer: Self-pay | Admitting: Oncology

## 2015-02-22 ENCOUNTER — Telehealth: Payer: Self-pay

## 2015-02-22 DIAGNOSIS — I82409 Acute embolism and thrombosis of unspecified deep veins of unspecified lower extremity: Secondary | ICD-10-CM | POA: Insufficient documentation

## 2015-02-22 DIAGNOSIS — I878 Other specified disorders of veins: Secondary | ICD-10-CM | POA: Insufficient documentation

## 2015-02-22 DIAGNOSIS — K5903 Drug induced constipation: Secondary | ICD-10-CM | POA: Insufficient documentation

## 2015-02-22 DIAGNOSIS — Z7901 Long term (current) use of anticoagulants: Secondary | ICD-10-CM | POA: Insufficient documentation

## 2015-02-22 DIAGNOSIS — Z9114 Patient's other noncompliance with medication regimen: Secondary | ICD-10-CM | POA: Insufficient documentation

## 2015-02-22 DIAGNOSIS — G620 Drug-induced polyneuropathy: Secondary | ICD-10-CM | POA: Insufficient documentation

## 2015-02-22 DIAGNOSIS — G893 Neoplasm related pain (acute) (chronic): Secondary | ICD-10-CM | POA: Insufficient documentation

## 2015-02-22 DIAGNOSIS — Z23 Encounter for immunization: Secondary | ICD-10-CM | POA: Insufficient documentation

## 2015-02-22 DIAGNOSIS — D500 Iron deficiency anemia secondary to blood loss (chronic): Secondary | ICD-10-CM | POA: Insufficient documentation

## 2015-02-22 DIAGNOSIS — T451X5A Adverse effect of antineoplastic and immunosuppressive drugs, initial encounter: Secondary | ICD-10-CM

## 2015-02-22 DIAGNOSIS — C569 Malignant neoplasm of unspecified ovary: Secondary | ICD-10-CM | POA: Insufficient documentation

## 2015-02-22 MED ORDER — LORAZEPAM 0.5 MG PO TABS: ORAL_TABLET | ORAL | Status: DC

## 2015-02-22 MED ORDER — ONDANSETRON HCL 8 MG PO TABS: 8.0000 mg | ORAL_TABLET | Freq: Two times a day (BID) | ORAL | Status: DC | PRN

## 2015-02-22 MED ORDER — PROCHLORPERAZINE MALEATE 10 MG PO TABS: 10.0000 mg | ORAL_TABLET | Freq: Four times a day (QID) | ORAL | Status: DC | PRN

## 2015-02-22 MED ORDER — DEXAMETHASONE 4 MG PO TABS: 8.0000 mg | ORAL_TABLET | Freq: Two times a day (BID) | ORAL | Status: DC

## 2015-02-22 NOTE — Telephone Encounter (Signed)
Discussed the rationale for change in chemotherapy from Taxol to Taxotere as noted below by Dr. Marko Plume. Reviewed Taxotere side effects and need for decadron premedication as well as neulasta support and it's side effects, especially the the aches. Sent prescriptions to patient's pharmacy as noted below and in treatment plan in EPIC.  Ms. Picklesimer did not go to the [place]  To fill out paper work.  Will follow up with Raquel in Managed Care to see if paper work can be done At [facility] as noted below.

## 2015-02-22 NOTE — Telephone Encounter (Signed)
Lisa Levan, MD  Baruch Merl, RN; Janace Hoard, RN           Please let her know that chemo has been changed to [place] taxotere because of her peripheral neuropathy.  RN please do teaching by phone for taxotere.   See previous message re antiemetics and decadron for taxotere.  Please go over neulasta used with taxotere - I discussed with her also.   I spoke with [place], and asked Raquel to follow up with patient re Eliquis application for ongoing assistance x 1 year after first 30 day supply that was given to her at hospital - please if RN could also ask patient if she went to [place] within 7 days of hospital DC as instructed, and if so if she already completed the Eliquis paperwork there (otherwise I hope Raquel can get paperwork done at [facility]). Per [facility] pharmacist, it usually takes 4-5 weeks to process the Eliquis forms, so needs to be done ASAP unless she plans to pay out of pocket   thanks

## 2015-02-22 NOTE — Telephone Encounter (Signed)
-----   Message from Gordy Levan, MD sent at 02/21/2015  1:21 PM EST ----- I used chemo order antiemetics, which I believe sends in the scripts for zofran, decadron, compazine. She will need different script for ativan 0.5 mg every 6 hr prn nausea SL or po.   She will have carbo first taxotere on 2-23 RN please speak with her by phone prior to 2-22, to go over times for the decadron   (2 of the 4 mg tabs with food AM and PM x 3 days begin day prior to taxotere, so start AM of 2-22)   thanks

## 2015-02-23 ENCOUNTER — Other Ambulatory Visit (HOSPITAL_COMMUNITY): Payer: Self-pay

## 2015-02-23 MED ORDER — APIXABAN 5 MG PO TABS
5.0000 mg | ORAL_TABLET | Freq: Two times a day (BID) | ORAL | Status: DC
Start: 2015-02-23 — End: 2015-03-03

## 2015-02-23 MED ORDER — APIXABAN 5 MG PO TABS: 5.0000 mg | ORAL_TABLET | Freq: Two times a day (BID) | ORAL | Status: DC

## 2015-02-24 ENCOUNTER — Ambulatory Visit (HOSPITAL_BASED_OUTPATIENT_CLINIC_OR_DEPARTMENT_OTHER): Payer: Self-pay

## 2015-02-24 ENCOUNTER — Other Ambulatory Visit: Payer: Self-pay

## 2015-02-24 ENCOUNTER — Ambulatory Visit: Payer: Self-pay | Admitting: Nutrition

## 2015-02-24 ENCOUNTER — Encounter: Payer: Self-pay | Admitting: Oncology

## 2015-02-24 ENCOUNTER — Other Ambulatory Visit: Payer: Self-pay | Admitting: Oncology

## 2015-02-24 VITALS — BP 210/78 | HR 73 | Temp 98.2°F | Resp 16

## 2015-02-24 DIAGNOSIS — C541 Malignant neoplasm of endometrium: Secondary | ICD-10-CM

## 2015-02-24 DIAGNOSIS — C801 Malignant (primary) neoplasm, unspecified: Secondary | ICD-10-CM

## 2015-02-24 DIAGNOSIS — Z5111 Encounter for antineoplastic chemotherapy: Secondary | ICD-10-CM

## 2015-02-24 DIAGNOSIS — C778 Secondary and unspecified malignant neoplasm of lymph nodes of multiple regions: Secondary | ICD-10-CM

## 2015-02-24 MED ORDER — PALONOSETRON HCL INJECTION 0.25 MG/5ML
INTRAVENOUS | Status: AC
  Filled 2015-02-24: qty 5

## 2015-02-24 MED ORDER — SODIUM CHLORIDE 0.9 % IV SOLN
430.0000 mg | Freq: Once | INTRAVENOUS | Status: AC
  Administered 2015-02-24: 430 mg via INTRAVENOUS
  Filled 2015-02-24: qty 43

## 2015-02-24 MED ORDER — DEXAMETHASONE SODIUM PHOSPHATE 100 MG/10ML IJ SOLN
10.0000 mg | Freq: Once | INTRAMUSCULAR | Status: AC
  Administered 2015-02-24: 10 mg via INTRAVENOUS
  Filled 2015-02-24: qty 1

## 2015-02-24 MED ORDER — PALONOSETRON HCL INJECTION 0.25 MG/5ML
0.2500 mg | Freq: Once | INTRAVENOUS | Status: AC
  Administered 2015-02-24: 0.25 mg via INTRAVENOUS

## 2015-02-24 MED ORDER — HEPARIN SOD (PORK) LOCK FLUSH 100 UNIT/ML IV SOLN
500.0000 [IU] | Freq: Once | INTRAVENOUS | Status: DC | PRN
  Filled 2015-02-24: qty 5

## 2015-02-24 MED ORDER — SODIUM CHLORIDE 0.9% FLUSH
10.0000 mL | INTRAVENOUS | Status: DC | PRN
  Filled 2015-02-24: qty 10

## 2015-02-24 MED ORDER — LORAZEPAM 1 MG PO TABS: 0.5000 mg | ORAL_TABLET | Freq: Once | ORAL | Status: DC | PRN

## 2015-02-24 MED ORDER — SODIUM CHLORIDE 0.9 % IV SOLN
Freq: Once | INTRAVENOUS | Status: AC
  Administered 2015-02-24: 12:00:00 via INTRAVENOUS

## 2015-02-24 MED ORDER — CLONIDINE HCL 0.2 MG PO TABS
0.1000 mg | ORAL_TABLET | Freq: Once | ORAL | Status: AC
  Administered 2015-02-24: 0.1 mg via ORAL
  Filled 2015-02-24: qty 0.5

## 2015-02-24 MED ORDER — CARBOPLATIN CHEMO INTRADERMAL TEST DOSE 100MCG/0.02ML
100.0000 ug | Freq: Once | INTRADERMAL | Status: AC
  Administered 2015-02-24: 100 ug via INTRADERMAL
  Filled 2015-02-24: qty 0.01

## 2015-02-24 MED ORDER — DOCETAXEL CHEMO INJECTION 160 MG/16ML
60.0000 mg/m2 | Freq: Once | INTRAVENOUS | Status: AC
  Administered 2015-02-24: 110 mg via INTRAVENOUS
  Filled 2015-02-24: qty 11

## 2015-02-24 NOTE — Progress Notes (Signed)
Medical Oncology  BPs elevated thru chemo today, also complaining of pain in back tho would not take pain medication until nearly finished chemo. Will give clonidine 0.1 mg now. Will have patient increase home clonidine to 0.1 mg tid for now, and follow BP at home. May do better with patch when needs next prescription.  RN to be sure she has enough pain medication available.  Godfrey Pick, MD

## 2015-02-24 NOTE — Progress Notes (Signed)
Patient was identified to be at risk for malnutrition secondary to poor appetite and weight loss on the MST.  60 year old female diagnosed with metastatic ovarian and endometrial in cancer.  She is a patient of Dr. Marko Plume.  Past medical history includes hypertension and radiation therapy.  Medications include ferrous sulfate, Zofran.  Labs include creatinine 1.03 and glucose 140 on February 16.  Height: 62 inches. Weight: 175.4 pounds. Usual body weight: 200 pounds. BMI: 32.07.  Patient complains of ongoing nausea.  She states Zofran has not really helped her. She attributes nausea to recent flu shot and states lorazepam helped her nausea. Patient is drinking ensure 1-2 times a day.  Patient meets criteria for severe malnutrition in the context of chronic illness secondary to 14% weight loss and less than 75% energy intake for greater than one month.  Nutrition diagnosis: Inadequate oral intake related to diagnosis of metastatic cancer as evidenced by 14% weight loss.  Intervention:  Patient was educated on strategies for consuming small frequent meals and snacks. I reviewed high-calorie high-protein fact sheet with patient and provided her a copy Encouraged patient to discuss nausea with M.D. for further prescriptions as needed. Recommended patient change regular ensure to Ensure Plus twice a day and provided coupons. Reviewed strategies for eating with nausea and vomiting and provided fact sheet Questions were answered.  Teach back method used.  Contact information was given.  Monitoring, evaluation, goals: Patient will tolerate adequate calories and protein to minimize weight loss.  Next visit: To be scheduled as needed.  **Disclaimer: This note was dictated with voice recognition software. Similar sounding words can inadvertently be transcribed and this note may contain transcription errors which may not have been corrected upon publication of note.**

## 2015-02-24 NOTE — Patient Instructions (Addendum)
[facility] Discharge Instructions for Patients Receiving Chemotherapy  Today you received the following chemotherapy agents Taxotere and Carboplatin  To help prevent nausea and vomiting after your treatment, we encourage you to take your nausea medication   If you develop nausea and vomiting that is not controlled by your nausea medication, call the clinic.   BELOW ARE SYMPTOMS THAT SHOULD BE REPORTED IMMEDIATELY:  *FEVER GREATER THAN 100.5 F  *CHILLS WITH OR WITHOUT FEVER  NAUSEA AND VOMITING THAT IS NOT CONTROLLED WITH YOUR NAUSEA MEDICATION  *UNUSUAL SHORTNESS OF BREATH  *UNUSUAL BRUISING OR BLEEDING  TENDERNESS IN MOUTH AND THROAT WITH OR WITHOUT PRESENCE OF ULCERS  *URINARY PROBLEMS  *BOWEL PROBLEMS  UNUSUAL RASH Items with * indicate a potential emergency and should be followed up as soon as possible.  Feel free to call the clinic you have any questions or concerns. The clinic phone number is [phone].  Please show the [facility] at check-in to the Emergency Department and triage nurse.  Carboplatin injection What is this medicine? CARBOPLATIN (KAR boe pla tin) is a chemotherapy drug. It targets fast dividing cells, like cancer cells, and causes these cells to die. This medicine is used to treat ovarian cancer and many other cancers. This medicine may be used for other purposes; ask your health care provider or pharmacist if you have questions. What should I tell my health care provider before I take this medicine? They need to know if you have any of these conditions: -blood disorders -hearing problems -kidney disease -recent or ongoing radiation therapy -an unusual or allergic reaction to carboplatin, cisplatin, other chemotherapy, other medicines, foods, dyes, or preservatives -pregnant or trying to get pregnant -breast-feeding How should I use this medicine? This drug is usually given as an infusion into a vein. It is administered  in a hospital or clinic by a specially trained health care professional. Talk to your pediatrician regarding the use of this medicine in children. Special care may be needed. Overdosage: If you think you have taken too much of this medicine contact a poison control center or emergency room at once. NOTE: This medicine is only for you. Do not share this medicine with others. What if I miss a dose? It is important not to miss a dose. Call your doctor or health care professional if you are unable to keep an appointment. What may interact with this medicine? -medicines for seizures -medicines to increase blood counts like filgrastim, pegfilgrastim, sargramostim -some antibiotics like amikacin, gentamicin, neomycin, streptomycin, tobramycin -vaccines Talk to your doctor or health care professional before taking any of these medicines: -acetaminophen -aspirin -ibuprofen -ketoprofen -naproxen This list may not describe all possible interactions. Give your health care provider a list of all the medicines, herbs, non-prescription drugs, or dietary supplements you use. Also tell them if you smoke, drink alcohol, or use illegal drugs. Some items may interact with your medicine. What should I watch for while using this medicine? Your condition will be monitored carefully while you are receiving this medicine. You will need important blood work done while you are taking this medicine. This drug may make you feel generally unwell. This is not uncommon, as chemotherapy can affect healthy cells as well as cancer cells. Report any side effects. Continue your course of treatment even though you feel ill unless your doctor tells you to stop. In some cases, you may be given additional medicines to help with side effects. Follow all directions for their use. Call  your doctor or health care professional for advice if you get a fever, chills or sore throat, or other symptoms of a cold or flu. Do not treat yourself.  This drug decreases your body's ability to fight infections. Try to avoid being around people who are sick. This medicine may increase your risk to bruise or bleed. Call your doctor or health care professional if you notice any unusual bleeding. Be careful brushing and flossing your teeth or using a toothpick because you may get an infection or bleed more easily. If you have any dental work done, tell your dentist you are receiving this medicine. Avoid taking products that contain aspirin, acetaminophen, ibuprofen, naproxen, or ketoprofen unless instructed by your doctor. These medicines may hide a fever. Do not become pregnant while taking this medicine. Women should inform their doctor if they wish to become pregnant or think they might be pregnant. There is a potential for serious side effects to an unborn child. Talk to your health care professional or pharmacist for more information. Do not breast-feed an infant while taking this medicine. What side effects may I notice from receiving this medicine? Side effects that you should report to your doctor or health care professional as soon as possible: -allergic reactions like skin rash, itching or hives, swelling of the face, lips, or tongue -signs of infection - fever or chills, cough, sore throat, pain or difficulty passing urine -signs of decreased platelets or bleeding - bruising, pinpoint red spots on the skin, black, tarry stools, nosebleeds -signs of decreased red blood cells - unusually weak or tired, fainting spells, lightheadedness -breathing problems -changes in hearing -changes in vision -chest pain -high blood pressure -low blood counts - This drug may decrease the number of white blood cells, red blood cells and platelets. You may be at increased risk for infections and bleeding. -nausea and vomiting -pain, swelling, redness or irritation at the injection site -pain, tingling, numbness in the hands or feet -problems with balance,  talking, walking -trouble passing urine or change in the amount of urine Side effects that usually do not require medical attention (report to your doctor or health care professional if they continue or are bothersome): -hair loss -loss of appetite -metallic taste in the mouth or changes in taste This list may not describe all possible side effects. Call your doctor for medical advice about side effects. You may report side effects to FDA at 1-800-FDA-1088. Where should I keep my medicine? This drug is given in a hospital or clinic and will not be stored at home. NOTE: This sheet is a summary. It may not cover all possible information. If you have questions about this medicine, talk to your doctor, pharmacist, or health care provider.    2016, Elsevier/Gold Standard. (2007-03-25 14:38:05)  Docetaxel injection What is this medicine? DOCETAXEL (doe se TAX el) is a chemotherapy drug. It targets fast dividing cells, like cancer cells, and causes these cells to die. This medicine is used to treat many types of cancers like breast cancer, certain stomach cancers, head and neck cancer, lung cancer, and prostate cancer. This medicine may be used for other purposes; ask your health care provider or pharmacist if you have questions. What should I tell my health care provider before I take this medicine? They need to know if you have any of these conditions: -infection (especially a virus infection such as chickenpox, cold sores, or herpes) -liver disease -low blood counts, like low white cell, platelet, or red cell counts -  an unusual or allergic reaction to docetaxel, polysorbate 80, other chemotherapy agents, other medicines, foods, dyes, or preservatives -pregnant or trying to get pregnant -breast-feeding How should I use this medicine? This drug is given as an infusion into a vein. It is administered in a hospital or clinic by a specially trained health care professional. Talk to your  pediatrician regarding the use of this medicine in children. Special care may be needed. Overdosage: If you think you have taken too much of this medicine contact a poison control center or emergency room at once. NOTE: This medicine is only for you. Do not share this medicine with others. What if I miss a dose? It is important not to miss your dose. Call your doctor or health care professional if you are unable to keep an appointment. What may interact with this medicine? -cyclosporine -erythromycin -ketoconazole -medicines to increase blood counts like filgrastim, pegfilgrastim, sargramostim -vaccines Talk to your doctor or health care professional before taking any of these medicines: -acetaminophen -aspirin -ibuprofen -ketoprofen -naproxen This list may not describe all possible interactions. Give your health care provider a list of all the medicines, herbs, non-prescription drugs, or dietary supplements you use. Also tell them if you smoke, drink alcohol, or use illegal drugs. Some items may interact with your medicine. What should I watch for while using this medicine? Your condition will be monitored carefully while you are receiving this medicine. You will need important blood work done while you are taking this medicine. This drug may make you feel generally unwell. This is not uncommon, as chemotherapy can affect healthy cells as well as cancer cells. Report any side effects. Continue your course of treatment even though you feel ill unless your doctor tells you to stop. In some cases, you may be given additional medicines to help with side effects. Follow all directions for their use. Call your doctor or health care professional for advice if you get a fever, chills or sore throat, or other symptoms of a cold or flu. Do not treat yourself. This drug decreases your body's ability to fight infections. Try to avoid being around people who are sick. This medicine may increase your risk  to bruise or bleed. Call your doctor or health care professional if you notice any unusual bleeding. This medicine may contain alcohol in the product. You may get drowsy or dizzy. Do not drive, use machinery, or do anything that needs mental alertness until you know how this medicine affects you. Do not stand or sit up quickly, especially if you are an older patient. This reduces the risk of dizzy or fainting spells. Avoid alcoholic drinks. Do not become pregnant while taking this medicine. Women should inform their doctor if they wish to become pregnant or think they might be pregnant. There is a potential for serious side effects to an unborn child. Talk to your health care professional or pharmacist for more information. Do not breast-feed an infant while taking this medicine. What side effects may I notice from receiving this medicine? Side effects that you should report to your doctor or health care professional as soon as possible: -allergic reactions like skin rash, itching or hives, swelling of the face, lips, or tongue -low blood counts - This drug may decrease the number of white blood cells, red blood cells and platelets. You may be at increased risk for infections and bleeding. -signs of infection - fever or chills, cough, sore throat, pain or difficulty passing urine -signs of decreased platelets  or bleeding - bruising, pinpoint red spots on the skin, black, tarry stools, nosebleeds -signs of decreased red blood cells - unusually weak or tired, fainting spells, lightheadedness -breathing problems -fast or irregular heartbeat -low blood pressure -mouth sores -nausea and vomiting -pain, swelling, redness or irritation at the injection site -pain, tingling, numbness in the hands or feet -swelling of the ankle, feet, hands -weight gain Side effects that usually do not require medical attention (report to your prescriber or health care professional if they continue or are  bothersome): -bone pain -complete hair loss including hair on your head, underarms, pubic hair, eyebrows, and eyelashes -diarrhea -excessive tearing -changes in the color of fingernails -loosening of the fingernails -nausea -muscle pain -red flush to skin -sweating -weak or tired This list may not describe all possible side effects. Call your doctor for medical advice about side effects. You may report side effects to FDA at 1-800-FDA-1088. Where should I keep my medicine? This drug is given in a hospital or clinic and will not be stored at home. NOTE: This sheet is a summary. It may not cover all possible information. If you have questions about this medicine, talk to your doctor, pharmacist, or health care provider.    2016, Elsevier/Gold Standard. (2014-01-04 16:04:57)

## 2015-02-25 ENCOUNTER — Telehealth: Payer: Self-pay

## 2015-02-25 ENCOUNTER — Ambulatory Visit (INDEPENDENT_AMBULATORY_CARE_PROVIDER_SITE_OTHER): Payer: Self-pay | Admitting: Physician Assistant

## 2015-02-25 VITALS — BP 181/101 | HR 70 | Temp 98.2°F | Resp 16 | Ht 62.0 in | Wt 178.2 lb

## 2015-02-25 DIAGNOSIS — I10 Essential (primary) hypertension: Secondary | ICD-10-CM

## 2015-02-25 DIAGNOSIS — I82402 Acute embolism and thrombosis of unspecified deep veins of left lower extremity: Secondary | ICD-10-CM

## 2015-02-25 DIAGNOSIS — R110 Nausea: Secondary | ICD-10-CM

## 2015-02-25 MED ORDER — ONDANSETRON 4 MG PO TBDP
4.0000 mg | ORAL_TABLET | Freq: Once | ORAL | Status: AC
  Administered 2015-02-25: 4 mg via ORAL

## 2015-02-25 NOTE — Patient Instructions (Addendum)
For nausea - phenergan ? suppository incase you cannot drink  Increase your Labetolol to 2 pills 3x/day - you need to be rechecked in a week.  Please keep track of your BP  Dr Lorelei Pont - [facility] - [address] [name], [place] [address]  Phone: [phone]

## 2015-02-25 NOTE — Telephone Encounter (Addendum)
Reviewed BP reading with Dr. Marko Plume.  Told Ms. Rentz that Dr. Marko Plume would like for her to go to an Urgent Care with Primary Care this afternoon.  Patient has been to the Urgent Care on [address] in the past. The will take patient until 6 pm today.  Daughter will be home at 74. Ms. Depaola will take the afternoon dose of  Clonidine now and call back with her BP reading.

## 2015-02-25 NOTE — Telephone Encounter (Signed)
[name]'s BP this morning was 175/88. No H/A She is going to take clonidine tid as instructed. She vomited one time yesterday.  Used nausea meds and has felt fine since the 1 episode. She is drinking fluids.  Taken  in 24 oz of  fluid today so far.   Drank an ensure this morning.   Bowels moved well yesterday.  None today. Told Ms. Griswold to take a capful of Miraxlax this evening if no BM today.  She can take 1 capful bid to help keep bowels moving. Ms. Chouinard know to call [phone] if any issues or concerns. She will keep appointment tomorrow for neulasta injection as scheduled.

## 2015-02-25 NOTE — Telephone Encounter (Signed)
[place] assistance forms printed yesterday from [employer].  Completed forms with Ms. Eshleman while at [facility] 02-24-15 for treatment. Income documentation was brought in today to be faxed with application. Faxed application to [phone].

## 2015-02-25 NOTE — Telephone Encounter (Signed)
LM for [name] stating that a call back from her was not received regarding BP after clonidine at 1400 as she stated. Encouraged her to go to Urgent care as Dr. Marko Plume recommends.

## 2015-02-25 NOTE — Progress Notes (Signed)
[name]  MRN: [id number] DOB: January 25, 1955  Subjective:  Pt presents to clinic with uncontrolled HTN.  She has a complicated cancer history with a recent diagnosis of metastatic adenocarcinoma and DVT.  She started chemo this week and she feels terrible esp with the nausea.  When she was in the hospital last week she had elevated BP and then increased her clonidine to 0.1mg  tid and added labetolol 100mg  tid.  She was at chemo yesterday and her BP was again high so she was told she needed evaluation.  She currently having no headache or vision changes.  She is able to check her BP at home.  Patient Active Problem List   Diagnosis Date Noted  . Poor venous access 02/22/2015  . Constipation due to pain medication 02/22/2015  . Noncompliance with medication regimen 02/22/2015  . Chronic anticoagulation 02/22/2015  . Leg DVT (deep venous thromboembolism), acute ([place] [facility]) 02/22/2015  . 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 Cancer associated pain 02/22/2015  . Iron deficiency anemia due to chronic blood loss 02/22/2015  . 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 Need for prophylactic vaccination and inoculation against influenza 02/22/2015  . Anemia of chronic disease 02/14/2015  . Anemia, iron deficiency 02/14/2015  . DVT (deep venous thrombosis), left 02/12/2015  . Uterine cancer ([facility]) 02/12/2015  . Hypokalemia 02/12/2015  . Acute deep vein thrombosis (DVT) ([place]) 02/12/2015  . Endometrial cancer ([place]) 11/20/2010  . Essential hypertension 09/23/2008  . NONSPECIFIC ABN FINDING RAD & OTH EXAM GI TRACT 09/23/2008    Current Outpatient Prescriptions on File Prior to Visit  Medication Sig Dispense Refill  . apixaban (ELIQUIS) 5 MG TABS tablet Take 1 tablet (5 mg total) by mouth 2 (two) times daily. 60 tablet 11  . cloNIDine (CATAPRES) 0.1 MG tablet Take 0.1 mg by mouth 3 (three) times daily.     Marland Kitchen  dexamethasone (DECADRON) 4 MG tablet Take 2 tablets (8 mg total) by mouth 2 (two) times daily. For 3 days.  Start the day before Taxotere. 30 tablet 1  . ferrous sulfate 325 (65 FE) MG tablet Take 1 tablet (325 mg total) by mouth 2 (two) times daily with a meal. 60 tablet 3  . hydrochlorothiazide (HYDRODIURIL) 25 MG tablet Take 25 mg by mouth daily.      Marland Kitchen labetalol (NORMODYNE) 100 MG tablet Take 1 tablet (100 mg total) by mouth 3 (three) times daily. 90 tablet 0  . LORazepam (ATIVAN) 0.5 MG tablet Place 1 tablet under the tongue or swallow every 6 hrs as needed for nausea. 20 tablet 0  . morphine (MSIR) 15 MG tablet Take 1 tablet (15 mg total) by mouth every 4 (four) hours as needed for moderate pain or severe pain. 30 tablet 0  . ondansetron (ZOFRAN) 4 MG tablet Take 1 tablet (4 mg total) by mouth every 6 (six) hours as needed for nausea. 20 tablet 0  . ondansetron (ZOFRAN) 8 MG tablet Take 1 tablet (8 mg total) by mouth 2 (two) times daily as needed for refractory nausea / vomiting. Start on day 3 after chemo. 30 tablet 1  . polyethylene glycol (MIRALAX / GLYCOLAX) packet Take 17 g by mouth 2 (two) times daily as needed.    . prochlorperazine (COMPAZINE) 10 MG tablet Take 1 tablet (10 mg total) by mouth every 6 (six) hours as needed (Nausea or vomiting). 30 tablet 1  . zolpidem (AMBIEN) 5 MG tablet Take  1 tablet (5 mg total) by mouth at bedtime as needed for sleep. (Patient not taking: Reported on 02/21/2015) 30 tablet 0   No current facility-administered medications on file prior to visit.    No Known Allergies  Review of Systems  Constitutional: Negative for fever and chills.  Eyes: Negative for visual disturbance.  Cardiovascular: Negative for chest pain.  Gastrointestinal: Positive for nausea.  Neurological: Negative for dizziness and headaches.   Objective:  BP 181/101 mmHg  Pulse 70  Temp(Src) 98.2 F (36.8 C) (Oral)  Resp 16  Ht 5\' 2"  (1.575 m)  Wt 178 lb 3.2 oz (80.831 kg)   BMI 32.58 kg/m2  SpO2 97%  Physical Exam  Constitutional: She is oriented to person, place, and time and well-developed, well-nourished, and in no distress.  Looks like she feel terrible  HENT:  Head: Normocephalic and atraumatic.  Right Ear: Hearing and external ear normal.  Left Ear: Hearing and external ear normal.  Eyes: Conjunctivae are normal.  Neck: Normal range of motion.  Cardiovascular: Normal rate, regular rhythm and normal heart sounds.   No murmur heard. Pulmonary/Chest: Effort normal and breath sounds normal. She has no wheezes.  Neurological: She is alert and oriented to person, place, and time. Gait normal.  Skin: Skin is warm and dry.  Psychiatric: Mood, memory, affect and judgment normal.  Vitals reviewed.  D/w Dr Everlene Farrier Assessment and Plan :  Nausea without vomiting - Plan: ondansetron (ZOFRAN-ODT) disintegrating tablet 4 mg - pt given an additional Zofran in the office and that helped her nausea. She plans to talk tomorrow at her appt about additional nausea medications that are available to her/  Benign essential HTN - uncontrolled - pt is currently on decadron which could be elevating her BP - we will increase her Labatelol to 200mg  tid - she will need close f/u and a family practice center was suggested to her as to reduce her exposure to pathogens in the waiting room -  She needs to monitor her BP at home and she has weekly appts for cancer stuff and that will allow it to be medically monitored but she very well may need additional medication for control.  Windell Hummingbird PA-C  [facility] 02/25/2015 7:14 PM

## 2015-02-25 NOTE — Telephone Encounter (Signed)
-----   Message from Gordy Levan, MD sent at 02/24/2015  5:05 PM EST ----- RN please check on her by phone in AM 2-24 Will need to go to urgent care or ED if BP still elevated

## 2015-02-26 ENCOUNTER — Ambulatory Visit (HOSPITAL_BASED_OUTPATIENT_CLINIC_OR_DEPARTMENT_OTHER): Payer: Self-pay

## 2015-02-26 VITALS — BP 185/92 | HR 78 | Temp 98.1°F

## 2015-02-26 DIAGNOSIS — C541 Malignant neoplasm of endometrium: Secondary | ICD-10-CM

## 2015-02-26 MED ORDER — PEGFILGRASTIM INJECTION 6 MG/0.6ML ~~LOC~~
6.0000 mg | PREFILLED_SYRINGE | Freq: Once | SUBCUTANEOUS | Status: AC
  Administered 2015-02-26: 6 mg via SUBCUTANEOUS

## 2015-02-26 NOTE — Progress Notes (Signed)
Patient reports Dr. Marko Plume has modified her HTN medications as of yesterday; changing the dosing. Patient reports feeling ok at this time.

## 2015-02-26 NOTE — Patient Instructions (Signed)
Pegfilgrastim injection What is this medicine? PEGFILGRASTIM (PEG fil gra stim) is a long-acting granulocyte colony-stimulating factor that stimulates the growth of neutrophils, a type of white blood cell important in the body's fight against infection. It is used to reduce the incidence of fever and infection in patients with certain types of cancer who are receiving chemotherapy that affects the bone marrow, and to increase survival after being exposed to high doses of radiation. This medicine may be used for other purposes; ask your health care provider or pharmacist if you have questions. What should I tell my health care provider before I take this medicine? They need to know if you have any of these conditions: -kidney disease -latex allergy -ongoing radiation therapy -sickle cell disease -skin reactions to acrylic adhesives (On-Body Injector only) -an unusual or allergic reaction to pegfilgrastim, filgrastim, other medicines, foods, dyes, or preservatives -pregnant or trying to get pregnant -breast-feeding How should I use this medicine? This medicine is for injection under the skin. If you get this medicine at home, you will be taught how to prepare and give the pre-filled syringe or how to use the On-body Injector. Refer to the patient Instructions for Use for detailed instructions. Use exactly as directed. Take your medicine at regular intervals. Do not take your medicine more often than directed. It is important that you put your used needles and syringes in a special sharps container. Do not put them in a trash can. If you do not have a sharps container, call your pharmacist or healthcare provider to get one. Talk to your pediatrician regarding the use of this medicine in children. While this drug may be prescribed for selected conditions, precautions do apply. Overdosage: If you think you have taken too much of this medicine contact a poison control center or emergency room at  once. NOTE: This medicine is only for you. Do not share this medicine with others. What if I miss a dose? It is important not to miss your dose. Call your doctor or health care professional if you miss your dose. If you miss a dose due to an On-body Injector failure or leakage, a new dose should be administered as soon as possible using a single prefilled syringe for manual use. What may interact with this medicine? Interactions have not been studied. Give your health care provider a list of all the medicines, herbs, non-prescription drugs, or dietary supplements you use. Also tell them if you smoke, drink alcohol, or use illegal drugs. Some items may interact with your medicine. This list may not describe all possible interactions. Give your health care provider a list of all the medicines, herbs, non-prescription drugs, or dietary supplements you use. Also tell them if you smoke, drink alcohol, or use illegal drugs. Some items may interact with your medicine. What should I watch for while using this medicine? You may need blood work done while you are taking this medicine. If you are going to need a MRI, CT scan, or other procedure, tell your doctor that you are using this medicine (On-Body Injector only). What side effects may I notice from receiving this medicine? Side effects that you should report to your doctor or health care professional as soon as possible: -allergic reactions like skin rash, itching or hives, swelling of the face, lips, or tongue -dizziness -fever -pain, redness, or irritation at site where injected -pinpoint red spots on the skin -red or dark-brown urine -shortness of breath or breathing problems -stomach or side pain, or pain   at the shoulder -swelling -tiredness -trouble passing urine or change in the amount of urine Side effects that usually do not require medical attention (report to your doctor or health care professional if they continue or are  bothersome): -bone pain -muscle pain This list may not describe all possible side effects. Call your doctor for medical advice about side effects. You may report side effects to FDA at 1-800-FDA-1088. Where should I keep my medicine? Keep out of the reach of children. Store pre-filled syringes in a refrigerator between 2 and 8 degrees C (36 and 46 degrees F). Do not freeze. Keep in carton to protect from light. Throw away this medicine if it is left out of the refrigerator for more than 48 hours. Throw away any unused medicine after the expiration date. NOTE: This sheet is a summary. It may not cover all possible information. If you have questions about this medicine, talk to your doctor, pharmacist, or health care provider.    2016, Elsevier/Gold Standard. (2014-01-07 14:30:14)  

## 2015-02-27 ENCOUNTER — Other Ambulatory Visit: Payer: Self-pay | Admitting: Oncology

## 2015-02-27 DIAGNOSIS — C541 Malignant neoplasm of endometrium: Secondary | ICD-10-CM

## 2015-02-28 ENCOUNTER — Encounter: Payer: Self-pay | Admitting: Gynecologic Oncology

## 2015-02-28 ENCOUNTER — Telehealth: Payer: Self-pay

## 2015-02-28 NOTE — Progress Notes (Signed)
Gynecologic Oncology Multi-Disciplinary Disposition Conference Note  Date of the Conference: February 28, 2015  Patient Name: [name]  Referring Provider: Dr. Clovia Cuff Primary GYN Oncologist: Dr. Everitt Amber  Stage/Disposition:  Recurrent clear cell ovarian cancer.  Disposition is to carboplatin and taxotere with consideration for palliative radiation to psoas mass.  Genetics referral for ovarian cancer diagnosis.     This Multidisciplinary conference took place involving physicians from [place], [place], Radiation Oncology, Pathology, Radiology along with the Gynecologic Oncology Nurse Practitioner and RN.  Comprehensive assessment of the patient's malignancy, staging, need for surgery, chemotherapy, radiation therapy, and need for further testing were reviewed. Supportive measures, both inpatient and following discharge were also discussed. The recommended plan of care is documented. Greater than 35 minutes were spent correlating and coordinating this patient's care.

## 2015-02-28 NOTE — Telephone Encounter (Signed)
No return call received from patient.  Dr. Marko Plume notified. Nurse to attempt to reach [name] tomorrow 03-01-15.

## 2015-02-28 NOTE — Telephone Encounter (Signed)
LM requesting that Ms. Stjames call back to Dr. Mariana Kaufman office to give BP readings since labetalol increased as noted below by Dr. Marko Plume.

## 2015-02-28 NOTE — Telephone Encounter (Signed)
-----   Message from Gordy Levan, MD sent at 02/27/2015  7:50 PM EST ----- RN please follow up by phone 2-27, to see what BP is since urgent care increased labetalol to 200 tid on 2-24, in addition to other meds including tid clonidine.  If still high I will try to get cardiology to help  thanks

## 2015-03-01 ENCOUNTER — Telehealth: Payer: Self-pay

## 2015-03-01 NOTE — Telephone Encounter (Signed)
Notification received from [employer] [employer] with approval for eliquis free of charge from 02/28/15 to 02/28/16. Given to Raquel

## 2015-03-01 NOTE — Telephone Encounter (Signed)
lvm to return our call. We are f/u on BP after adjustments in medications.

## 2015-03-02 ENCOUNTER — Encounter: Payer: Self-pay | Admitting: Oncology

## 2015-03-02 NOTE — Progress Notes (Signed)
I recd note from brisol myers   eliquis was approved for her no charge 02/28/15-02/28/16  Case#[id number].

## 2015-03-03 ENCOUNTER — Other Ambulatory Visit (HOSPITAL_BASED_OUTPATIENT_CLINIC_OR_DEPARTMENT_OTHER): Payer: Self-pay

## 2015-03-03 ENCOUNTER — Encounter: Payer: Self-pay | Admitting: Oncology

## 2015-03-03 ENCOUNTER — Ambulatory Visit (HOSPITAL_BASED_OUTPATIENT_CLINIC_OR_DEPARTMENT_OTHER): Payer: Self-pay | Admitting: Oncology

## 2015-03-03 ENCOUNTER — Telehealth: Payer: Self-pay | Admitting: Oncology

## 2015-03-03 ENCOUNTER — Ambulatory Visit (HOSPITAL_BASED_OUTPATIENT_CLINIC_OR_DEPARTMENT_OTHER): Payer: Self-pay

## 2015-03-03 ENCOUNTER — Other Ambulatory Visit: Payer: Self-pay | Admitting: *Deleted

## 2015-03-03 VITALS — BP 146/65 | HR 72 | Temp 98.7°F | Resp 18

## 2015-03-03 VITALS — BP 152/69 | HR 80 | Temp 99.3°F | Resp 18 | Ht 62.0 in | Wt 174.9 lb

## 2015-03-03 DIAGNOSIS — C541 Malignant neoplasm of endometrium: Secondary | ICD-10-CM

## 2015-03-03 DIAGNOSIS — D500 Iron deficiency anemia secondary to blood loss (chronic): Secondary | ICD-10-CM

## 2015-03-03 DIAGNOSIS — D509 Iron deficiency anemia, unspecified: Secondary | ICD-10-CM

## 2015-03-03 DIAGNOSIS — I82402 Acute embolism and thrombosis of unspecified deep veins of left lower extremity: Secondary | ICD-10-CM

## 2015-03-03 DIAGNOSIS — Z9114 Patient's other noncompliance with medication regimen: Secondary | ICD-10-CM

## 2015-03-03 DIAGNOSIS — I878 Other specified disorders of veins: Secondary | ICD-10-CM

## 2015-03-03 DIAGNOSIS — G893 Neoplasm related pain (acute) (chronic): Secondary | ICD-10-CM

## 2015-03-03 DIAGNOSIS — G620 Drug-induced polyneuropathy: Secondary | ICD-10-CM

## 2015-03-03 DIAGNOSIS — T451X5A Adverse effect of antineoplastic and immunosuppressive drugs, initial encounter: Secondary | ICD-10-CM

## 2015-03-03 DIAGNOSIS — Z7901 Long term (current) use of anticoagulants: Secondary | ICD-10-CM

## 2015-03-03 DIAGNOSIS — I10 Essential (primary) hypertension: Secondary | ICD-10-CM

## 2015-03-03 DIAGNOSIS — C801 Malignant (primary) neoplasm, unspecified: Secondary | ICD-10-CM

## 2015-03-03 DIAGNOSIS — C569 Malignant neoplasm of unspecified ovary: Secondary | ICD-10-CM

## 2015-03-03 DIAGNOSIS — R112 Nausea with vomiting, unspecified: Secondary | ICD-10-CM

## 2015-03-03 DIAGNOSIS — Z8543 Personal history of malignant neoplasm of ovary: Secondary | ICD-10-CM

## 2015-03-03 DIAGNOSIS — Z8542 Personal history of malignant neoplasm of other parts of uterus: Secondary | ICD-10-CM

## 2015-03-03 LAB — COMPREHENSIVE METABOLIC PANEL
ALK PHOS: 107 U/L (ref 40–150)
ALT: <9 U/L (ref 0–55)
AST: 8 U/L (ref 5–34)
Albumin: 2.6 g/dL — ABNORMAL LOW (ref 3.5–5.0)
Anion Gap: 10 mEq/L (ref 3–11)
BUN: 10.1 mg/dL (ref 7.0–26.0)
CO2: 25 meq/L (ref 22–29)
Calcium: 9.1 mg/dL (ref 8.4–10.4)
Chloride: 99 mEq/L (ref 98–109)
Creatinine: 0.8 mg/dL (ref 0.6–1.1)
EGFR: 89 mL/min/{1.73_m2} — ABNORMAL LOW (ref >90)
GLUCOSE: 142 mg/dL — AB (ref 70–140)
POTASSIUM: 3.6 meq/L (ref 3.5–5.1)
SODIUM: 133 meq/L — AB (ref 136–145)
TOTAL PROTEIN: 6.5 g/dL (ref 6.4–8.3)
Total Bilirubin: 0.41 mg/dL (ref 0.20–1.20)

## 2015-03-03 LAB — CBC WITH DIFFERENTIAL/PLATELET
BASO%: 0.2 % (ref 0.0–2.0)
Basophils Absolute: 0 10*3/uL (ref 0.0–0.1)
EOS ABS: 0.1 10*3/uL (ref 0.0–0.5)
EOS%: 3.3 % (ref 0.0–7.0)
HCT: 27.8 % — ABNORMAL LOW (ref 34.8–46.6)
HGB: 8.8 g/dL — ABNORMAL LOW (ref 11.6–15.9)
LYMPH%: 16.3 % (ref 14.0–49.7)
MCH: 23.3 pg — AB (ref 25.1–34.0)
MCHC: 31.7 g/dL (ref 31.5–36.0)
MCV: 73.5 fL — AB (ref 79.5–101.0)
MONO#: 0.7 10*3/uL (ref 0.1–0.9)
MONO%: 16.8 % — AB (ref 0.0–14.0)
NEUT#: 2.7 10*3/uL (ref 1.5–6.5)
NEUT%: 63.4 % (ref 38.4–76.8)
PLATELETS: 273 10*3/uL (ref 145–400)
RBC: 3.78 10*6/uL (ref 3.70–5.45)
RDW: 21.2 % — ABNORMAL HIGH (ref 11.2–14.5)
WBC: 4.2 10*3/uL (ref 3.9–10.3)
lymph#: 0.7 10*3/uL — ABNORMAL LOW (ref 0.9–3.3)

## 2015-03-03 MED ORDER — MORPHINE SULFATE 15 MG PO TABS: 15.0000 mg | ORAL_TABLET | ORAL | Status: DC | PRN

## 2015-03-03 MED ORDER — SODIUM CHLORIDE 0.9 % IV SOLN
510.0000 mg | Freq: Once | INTRAVENOUS | Status: AC
  Administered 2015-03-03: 510 mg via INTRAVENOUS
  Filled 2015-03-03: qty 17

## 2015-03-03 MED ORDER — SODIUM CHLORIDE 0.9 % IV SOLN
INTRAVENOUS | Status: DC
  Administered 2015-03-03: 10:00:00 via INTRAVENOUS

## 2015-03-03 MED ORDER — SODIUM CHLORIDE 0.9 % IV SOLN
Freq: Once | INTRAVENOUS | Status: AC
  Administered 2015-03-03: 10:00:00 via INTRAVENOUS
  Filled 2015-03-03: qty 4

## 2015-03-03 MED ORDER — APIXABAN 5 MG PO TABS: 5.0000 mg | ORAL_TABLET | Freq: Two times a day (BID) | ORAL | Status: DC

## 2015-03-03 NOTE — Telephone Encounter (Signed)
appt made and avs printed °

## 2015-03-03 NOTE — Patient Instructions (Signed)

## 2015-03-03 NOTE — Progress Notes (Signed)
OFFICE PROGRESS NOTE   March 03, 2015   Physicians: Everitt Amber  INTERVAL HISTORY:   Patient is seen, together with daughter, in continuing attention to metastatic gyn carcinoma, for which she began carboplatin taxotere on 02-24-15, with carbo skin test, and neulasta on 02-26-15. Case was presented at [facility] on 02-28-15, recommendation for carbo taxotere, RT to left psoas if needed for symptoms, genetics counseling. She continues Eliquis for LLE DVT related to pelvic adenopathy. Per [facility] financial staff 03-02-2015:   brisol myersapprovedeliquis no charge 02/28/15-02/28/16 Case#[id number].  She was seen at urgent care 02-25-15 due to BP still excessively elevated, at 181/101. Labetolol was increased to 200 mg tid in addition to clonidine now 0.1 mg tid and HCTZ 25 mg daily. She was given name of a possible PCP, but has not contacted that office yet. I would also like to try [facility] if they are taking new patients, message to RN.  Patient has had nausea and some vomiting since first chemo, vomiting when she initially arrived home from treatment then ~ 5x. She has used only 4 mg zofran per dose, has tried compazine and apparently ativan also without good relief. She has had no vomiting in last several days,  ate regular diet yesterday, has had Ensure today. Bowels are moving well daily with miralax bid. Swelling in LLE much improved. The right inguinal mass that was aspirated for 60 cc 02-15-15 seems again somewhat larger and somewhat more uncomfortable. She used MSIR x 1 in past 24 hrs for left low back pain. She denies bladder symptoms. No bleeding. 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 Some aching with neulasta, but not complaining of severe aches. She feels very fatigued, likely in part with B blocker and other changes in BP meds. She has checked BP at home but is unable to tell me those values, needs to start a  log and bring that to all visits. No fever known at home, no chills. States peripheral IV access not difficult for chemo, tho this was problematic in hospital; she does not yet have central line due to anticoagulation for extensive acute LLE DVT at presentation. Is ambulatory at home. Denies SOB, cough, chest pain. Denies mucositis symptoms.  Remainder of 10 point Review of Systems negative.   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 Complete records from 2010 requested from [facility]. Last follow up visit for the gyn cancers appears to have been with Dr Sondra Come in 11-2010, apparently lost to follow up since then.  Patient reports right inguinal mass for ~ 2 months, then LLE pain for ~ 3.5 weeks prior to presenting to ED on 02-12-15. She had recently made some changes in diet (a "Ileene Musa" thru her church with only fruits and vegetables), but also appetite not great so that she lost "2 dress sizes" in past 1-2 months. Only pain was in right groin, this improved since necrotic nodal mass was aspirated for 60 cc dark fluid on 02-15-15. She denies fever or other symptoms of infection and had no bleeding PTA. She denies SOB, cough, chest pain, wheezing. She denies HA, other neurologic changes  other than drowsiness with medication in hospital. Bowels have been moving, denies melanotic stools. Bladder asymptomatic, apparently no hematuria. Some nausea. Had been less active over last couple of weeks with LE discomfort. Peripheral IV access has not been easy in hospital. In ED on 02-12-15 CT AP showed nodules in lung bases suspicious for metastatic disease, liver not  remarkable, no hydronephrosis, 6 mm right renal calculus, thrombus left external iliac vein into left common femoral vein, retroperitoneal and pelvic adenopathy, an 8.5 x 7.1 cm necrotic right inguinal node, post hyst ooph, no ascites, likely tumor involvement in left psoas with adjacent involvement of L4 and L5 vertebral bodies. Patient was admitted, begun on heparin qtt which was transitioned to Eliquis on 02-14-15. She had aspiration and core needle biopsy of right inguinal nodal mass on 02-15-15. CA 125 from 02-14-15 was 642.9, this having been 3.6 in 08-2010. She was seen in consultation by Dr Denman George on 02-14-15. Pathology finalized today shows metastatic adenocarcinoma with immunostains consistent with gyn primary ([id number]). She was transfused 2 units PRBCs for hgb 6.7, and begun on oral iron. Iron studies 2-12 with serum iron 14 and %sat 7. First carbo taxotere given 02-24-15 .  Objective:  Vital signs in last 24 hours:  BP 152/69 mmHg  Pulse 80  Temp(Src) 99.3 F (37.4 C) (Oral)  Resp 18  Ht _0  (1.575 m)  Wt 174 lb 14.4 oz (79.334 kg)  BMI 31.98 kg/m2  SpO2 100% Weight down 1 lb Alert, oriented and appropriate, cooperative and pleasant,  does not appear in distress. Daughter very supportive. Using WC for office. Respirations not labored RA.  No alopecia  HEENT:PERRL, sclerae not icteric. Oral mucosa moist without lesions, posterior pharynx clear.  Neck supple. No JVD.  Lymphatics:right inguinal nodal mass some increased in size compared with just after aspiration, mildly tender, not hot or fluctuant Resp: somewhat diminished BS but clear to auscultation bilaterally and no dullness to percussion bilaterally Cardio: regular rate and rhythm. No gallop. GI: soft, nontender, not more distended, no organomegaly. A few bowel sounds. Surgical incision not remarkable. Musculoskeletal/ Extremities: LLE only mildly increased in size upper > lower leg compared with RLE no swelling, without  cords, tenderness. No problems at site of peripheral access for first chemo. Feet warm Neuro: no change peripheral neuropathy feet and fingers.Speech fluent, moves all extremities equally, otherwise nonfocal. PSYCH interacts easily, mood and affect appropriate.  Skin without rash, ecchymosis, petechiae   Lab Results:  Results for orders placed or performed in visit on 02/21/15  CBC with Differential  Result Value Ref Range   WBC 13.0 (H) 3.9 - 10.3 10e3/uL   NEUT# 10.7 (H) 1.5 - 6.5 10e3/uL   HGB 10.7 (L) 11.6 - 15.9 g/dL   HCT 33.7 (L) 34.8 - 46.6 %   Platelets 411 (H) 145 - 400 10e3/uL   MCV 73.1 (L) 79.5 - 101.0 fL   MCH 23.1 (L) 25.1 - 34.0 pg   MCHC 31.6 31.5 - 36.0 g/dL   RBC 4.61 3.70 - 5.45 10e6/uL   RDW 24.0 (H) 11.2 - 14.5 %   lymph# 1.0 0.9 - 3.3 10e3/uL   MONO# 1.1 (H) 0.1 - 0.9 10e3/uL   Eosinophils Absolute 0.3 0.0 - 0.5 10e3/uL   Basophils Absolute 0.1 0.0 - 0.1 10e3/uL   NEUT% 81.7 (H) 38.4 - 76.8 %   LYMPH% 7.3 (L) 14.0 - 49.7 %   MONO% 8.2 0.0 - 14.0 %   EOS% 2.0 0.0 - 7.0 %  BASO% 0.8 0.0 - 2.0 %  Comprehensive metabolic panel  Result Value Ref Range   Sodium 135 (L) 136 - 145 mEq/L   Potassium 3.9 3.5 - 5.1 mEq/L   Chloride 98 98 - 109 mEq/L   CO2 24 22 - 29 mEq/L   Glucose 105 70 - 140 mg/dl   BUN 11.0 7.0 - 26.0 mg/dL   Creatinine 0.9 0.6 - 1.1 mg/dL   Total Bilirubin 0.58 0.20 - 1.20 mg/dL   Alkaline Phosphatase 118 40 - 150 U/L   AST 10 5 - 34 U/L   ALT 13 0 - 55 U/L   Total Protein 8.2 6.4 - 8.3 g/dL   Albumin 2.9 (L) 3.5 - 5.0 g/dL   Calcium 10.2 8.4 - 10.4 mg/dL   Anion Gap 13 (H) 3 - 11 mEq/L   EGFR 83 (L) >90 ml/min/1.73 m2    CBC reviewed with patient at time of visit.   Studies/Results:  No results found.  Medications: I have reviewed the patient's current medications. Increase zofran to 8 mg every 8 hrs prn. Continue Eliquis, BP meds, miralax. Will give zofran 8 mg IV + decadron 6 mg IV with #2 feraheme today. Will increase  antiemetics with subsequent chemotherapy cycles if approved.  Script MSIR #20 given  DISCUSSION: Interval history and recommendations including medication adjustments as above. We are pleased that LLE swelling is much better, hopefully that reflecting extensive DVT and the deep pelvic adenopathy on that side. Increase in right inguinal mass most likely reflecting reaccumulation of fluid, could aspirate again if symptoms require. Discussed gyn onc [place] recommendations, including RT to left psoas region if symptoms require; however, as she needed pain medication only x1 in last 24 hrs and with chemo just begun, we agree to follow this without addition of RT at present. Discussed referral for genetics counseling and possible testing. Patient and daughter both would like this, referral made.  Discussed HTN and need for PCP to manage this more appropriately than can be done at this office. Hopefully they will follow up with the recommendation given by Urgent Care; this office will look into [facility], as they may be taking new patients again starting this month. Patient and daughter very appreciative of Eliquis assistance. Meds as above  Assessment/Plan: 1.metastatic adenocarcinoma of gyn primary, in patient with history of IC clear cell ovarian and IA high grade endometrial carcinomas 03-2008, treated with incomplete staging, TAH BSO, adjuvant carboplatin taxol x 6 cycles and radiation. Present metastatic disease involves large necrotic right inguinal node, paraaortic/ aortocaval/ external iliac nodes, and multiple bilateral pulmonary nodes. 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 First carbo taxotere  02-24-15 with [place] skin test and with neulasta support on day 3. She will see APP with CBC on 3-6 and I will see her with lab on 03-14-15. She will be due cycle 2 carbo taxotere on 03-17-15. Genetics referral . Prn MSIR , could do RT to  left psoas area if needed; have not added zometa yet as chemo initiated just now.  2.LLE DVT documented 02-12-15: on Eliquis, leg no longer painful, swelling improved. No bleeding. Does not have IVC filter. Eliquis approved thru [employer] assistance x 1 year now. 3.severe iron deficiency anemia: etiology not clear. Does not seem to have had heme check stools in hospital (patient tells me this was done in ED, tho I have not located that information in EMR) and UA was negative  for blood. Feraheme 02-16-15 and second dose today.  4.difficult IV access: really needs PAC for chemo, however placement complicated by Eliquis and acute, extensive LLE DVT. I do not think she should be off anticoagulation x 48 hours at this point, as is IR protocol for PAC and Eliquis. Will either need to manage with peripheral IVs to start + PAC later as outpatient, or possibly PICC. 5.overdue mammograms, general medical care, colonoscopy. Noncompliance with follow up after cancer diagnoses and adjuvant treatment after ~ 2012.  Noncompliant with care for HTN over past several years, which I believe is despite family efforts (other daughter is pediatrician) .  Needs PCP.  6.uncontrolled HTN: required several doses clonidine at time of first chemo, now on labetalol 200 mg tid per urgent care + clonidine 0.1 mg tid and HCTZ. BP best today that she has had at 152/69. Needs PCP, or if more problems prior will try to get her to cardiology 7.recent weight loss, in part intentional. Discussed need for good nutrition 8.information on advance directives given 9.flu vaccine 02-21-15 10.chemo nausea and vomiting: will discuss addition of EMEND/ aloxi with managed care prior to my next visit on 3-13 (not discussed with patient yet). Increase zofran to 8 mg q 8 hr prn now. 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 12.Social: Hopefully medicaid pending, no  notes in this regard that I find from [facility] staff, tho Eliquis information as above. Message to [facility] managed care.  Feraheme and antiemetics orders in place now. Will adjust IV antiemetics for cycle 2 chemo 03-17-15 when I hear back from managed care.  All questions answered and patient/ daughter express appreciation for care.  Time spent 40 min including >50% counseling and coordination of care.    Deunta Beneke P, MD   03/03/2015, 12:25 PM

## 2015-03-05 ENCOUNTER — Other Ambulatory Visit: Payer: Self-pay | Admitting: Oncology

## 2015-03-05 DIAGNOSIS — R112 Nausea with vomiting, unspecified: Secondary | ICD-10-CM | POA: Insufficient documentation

## 2015-03-05 DIAGNOSIS — I82502 Chronic embolism and thrombosis of unspecified deep veins of left lower extremity: Secondary | ICD-10-CM | POA: Insufficient documentation

## 2015-03-05 DIAGNOSIS — T451X5A Adverse effect of antineoplastic and immunosuppressive drugs, initial encounter: Secondary | ICD-10-CM

## 2015-03-07 ENCOUNTER — Encounter: Payer: Self-pay | Admitting: Oncology

## 2015-03-07 ENCOUNTER — Other Ambulatory Visit (HOSPITAL_BASED_OUTPATIENT_CLINIC_OR_DEPARTMENT_OTHER): Payer: Self-pay

## 2015-03-07 ENCOUNTER — Ambulatory Visit (HOSPITAL_BASED_OUTPATIENT_CLINIC_OR_DEPARTMENT_OTHER): Payer: Self-pay | Admitting: Oncology

## 2015-03-07 ENCOUNTER — Other Ambulatory Visit: Payer: Self-pay | Admitting: *Deleted

## 2015-03-07 VITALS — BP 167/66 | HR 66 | Temp 97.4°F | Resp 18 | Ht 62.0 in | Wt 174.1 lb

## 2015-03-07 DIAGNOSIS — C801 Malignant (primary) neoplasm, unspecified: Secondary | ICD-10-CM

## 2015-03-07 DIAGNOSIS — I82402 Acute embolism and thrombosis of unspecified deep veins of left lower extremity: Secondary | ICD-10-CM

## 2015-03-07 DIAGNOSIS — G620 Drug-induced polyneuropathy: Secondary | ICD-10-CM

## 2015-03-07 DIAGNOSIS — Z8542 Personal history of malignant neoplasm of other parts of uterus: Secondary | ICD-10-CM

## 2015-03-07 DIAGNOSIS — C541 Malignant neoplasm of endometrium: Secondary | ICD-10-CM

## 2015-03-07 DIAGNOSIS — D509 Iron deficiency anemia, unspecified: Secondary | ICD-10-CM

## 2015-03-07 DIAGNOSIS — Z8543 Personal history of malignant neoplasm of ovary: Secondary | ICD-10-CM

## 2015-03-07 DIAGNOSIS — R112 Nausea with vomiting, unspecified: Secondary | ICD-10-CM

## 2015-03-07 DIAGNOSIS — I10 Essential (primary) hypertension: Secondary | ICD-10-CM

## 2015-03-07 DIAGNOSIS — C569 Malignant neoplasm of unspecified ovary: Secondary | ICD-10-CM

## 2015-03-07 LAB — CBC WITH DIFFERENTIAL/PLATELET
BASO%: 0.2 % (ref 0.0–2.0)
Basophils Absolute: 0 10*3/uL (ref 0.0–0.1)
EOS ABS: 0.1 10*3/uL (ref 0.0–0.5)
EOS%: 0.4 % (ref 0.0–7.0)
HCT: 27.6 % — ABNORMAL LOW (ref 34.8–46.6)
HEMOGLOBIN: 8.8 g/dL — AB (ref 11.6–15.9)
LYMPH#: 1.9 10*3/uL (ref 0.9–3.3)
LYMPH%: 10.4 % — ABNORMAL LOW (ref 14.0–49.7)
MCH: 23.7 pg — AB (ref 25.1–34.0)
MCHC: 31.9 g/dL (ref 31.5–36.0)
MCV: 74.2 fL — AB (ref 79.5–101.0)
MONO#: 1 10*3/uL — ABNORMAL HIGH (ref 0.1–0.9)
MONO%: 5.6 % (ref 0.0–14.0)
NEUT%: 83.4 % — ABNORMAL HIGH (ref 38.4–76.8)
NEUTROS ABS: 14.8 10*3/uL — AB (ref 1.5–6.5)
PLATELETS: 293 10*3/uL (ref 145–400)
RBC: 3.72 10*6/uL (ref 3.70–5.45)
RDW: 22.6 % — AB (ref 11.2–14.5)
WBC: 17.8 10*3/uL — AB (ref 3.9–10.3)

## 2015-03-07 LAB — COMPREHENSIVE METABOLIC PANEL
ALBUMIN: 2.7 g/dL — AB (ref 3.5–5.0)
ALK PHOS: 132 U/L (ref 40–150)
ALT: 10 U/L (ref 0–55)
ANION GAP: 11 meq/L (ref 3–11)
AST: 9 U/L (ref 5–34)
BILIRUBIN TOTAL: 0.44 mg/dL (ref 0.20–1.20)
BUN: 8.5 mg/dL (ref 7.0–26.0)
CALCIUM: 9.2 mg/dL (ref 8.4–10.4)
CO2: 25 mEq/L (ref 22–29)
Chloride: 101 mEq/L (ref 98–109)
Creatinine: 0.8 mg/dL (ref 0.6–1.1)
EGFR: 88 mL/min/{1.73_m2} — AB (ref >90)
GLUCOSE: 106 mg/dL (ref 70–140)
POTASSIUM: 3.5 meq/L (ref 3.5–5.1)
SODIUM: 137 meq/L (ref 136–145)
TOTAL PROTEIN: 6.5 g/dL (ref 6.4–8.3)

## 2015-03-07 MED ORDER — LORAZEPAM 0.5 MG PO TABS: ORAL_TABLET | ORAL | Status: DC

## 2015-03-07 NOTE — Progress Notes (Signed)
OFFICE PROGRESS NOTE   March 07, 2015   Physicians: Everitt Amber  INTERVAL HISTORY:   Patient is seen, together with daughters, in continuing attention to metastatic gyn carcinoma, for which she began carboplatin taxotere on 02-24-15, with carbo skin test, and neulasta on 02-26-15. Case was presented at [facility] on 02-28-15, recommendation for carbo taxotere, RT to left psoas if needed for symptoms, genetics counseling. She continues Eliquis for LLE DVT related to pelvic adenopathy. Per [facility] financial staff 03-02-2015:   brisol myersapprovedeliquis no charge 02/28/15-02/28/16 Case#[id number].  Patient os having fatigue and arthralgias. Thinks arthralgias are due to flu shot and then Neulasta. Has not really taken any Tylenol for pain. Has has intermittent nausea. She has had no vomiting in last several days. Bowels are moving well daily with miralax bid. Swelling in LLE much improved. The right inguinal mass has been stable. She denies bladder symptoms. No bleeding. No increase in residual peripheral neuropathy. No fever known at home, no chills. States peripheral IV access not difficult for chemo, tho this was problematic in hospital; she does not yet have central line due to anticoagulation for extensive acute LLE DVT at presentation. Is ambulatory at home. Denies SOB, cough, chest pain. Denies mucositis symptoms.  Remainder of 10 point Review of Systems negative.   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 Complete records from 2010 requested from [facility]. Last follow up visit for the gyn cancers appears to have been with  Dr Sondra Come in 11-2010, apparently lost to follow up since then.  Patient reports right inguinal mass for ~ 2 months, then LLE pain for ~ 3.5 weeks prior to presenting to ED on 02-12-15. She had recently made some changes in diet (a "Ileene Musa" thru her church with only fruits and vegetables), but also appetite not great so that she lost "2 dress sizes" in past 1-2 months. Only pain was in right groin, this improved since necrotic nodal mass was aspirated for 60 cc dark fluid on 02-15-15. She denies fever or other symptoms of infection and had no bleeding PTA. She denies SOB, cough, chest pain, wheezing. She denies HA, other neurologic changes other than drowsiness with medication in hospital. Bowels have been moving, denies melanotic stools. Bladder asymptomatic, apparently no hematuria. Some nausea. Had been less active over last couple of weeks with LE discomfort. Peripheral IV access has not been easy in hospital. In ED on 02-12-15 CT AP showed nodules in lung bases suspicious for metastatic disease, liver not remarkable, no hydronephrosis, 6 mm right renal calculus, thrombus left external iliac vein into left common femoral vein, retroperitoneal and pelvic adenopathy, an 8.5 x 7.1 cm necrotic right inguinal node, post hyst ooph, no ascites, likely tumor involvement in left psoas with adjacent involvement of L4 and L5 vertebral bodies. Patient was admitted, begun on heparin qtt which was transitioned to Eliquis on 02-14-15. She had aspiration and core needle biopsy of right inguinal nodal mass on 02-15-15. CA 125 from 02-14-15 was 642.9, this having been 3.6 in 08-2010. She was seen in consultation by Dr Denman George on 02-14-15. Pathology finalized today shows metastatic adenocarcinoma with immunostains consistent with gyn primary ([id number]).  She was transfused 2 units PRBCs for hgb 6.7, and begun on oral iron. Iron studies 2-12 with serum iron 14 and %sat 7. First carbo taxotere given 02-24-15 .  Objective:  Vital  signs in last 24 hours:  BP 167/66 mmHg  Pulse 66  Temp(Src) 97.4 F (36.3 C) (Oral)  Resp 18  Ht _0  (1.575 m)  Wt 174 lb 1.6 oz (78.971 kg)  BMI 31.84 kg/m2  SpO2 99% Weight stable Alert, oriented and appropriate, cooperative and pleasant,  does not appear in distress. Daughters very supportive. Respirations not labored RA.  No alopecia  HEENT:PERRL, sclerae not icteric. Oral mucosa moist without lesions, posterior pharynx clear.  Neck supple. No JVD.  Lymphatics:right inguinal nodal mass some increased in size compared with just after aspiration, mildly tender, not hot or fluctuant Resp: somewhat diminished BS but clear to auscultation bilaterally and no dullness to percussion bilaterally Cardio: regular rate and rhythm. No gallop. GI: soft, nontender, not more distended, no organomegaly. A few bowel sounds. Surgical incision not remarkable. Musculoskeletal/ Extremities: LLE only mildly increased in size upper > lower leg compared with RLE no swelling, without cords, tenderness. No problems at site of peripheral access for first chemo. Feet warm Neuro: no change peripheral neuropathy feet and fingers.Speech fluent, moves all extremities equally, otherwise nonfocal. PSYCH interacts easily, mood and affect appropriate.  Skin without rash, ecchymosis, petechiae   Lab Results:  Results for orders placed or performed in visit on 03/07/15  CBC with Differential  Result Value Ref Range   WBC 17.8 (H) 3.9 - 10.3 10e3/uL   NEUT# 14.8 (H) 1.5 - 6.5 10e3/uL   HGB 8.8 (L) 11.6 - 15.9 g/dL   HCT 27.6 (L) 34.8 - 46.6 %   Platelets 293 145 - 400 10e3/uL   MCV 74.2 (L) 79.5 - 101.0 fL   MCH 23.7 (L) 25.1 - 34.0 pg   MCHC 31.9 31.5 - 36.0 g/dL   RBC 3.72 3.70 - 5.45 10e6/uL   RDW 22.6 (H) 11.2 - 14.5 %   lymph# 1.9 0.9 - 3.3 10e3/uL   MONO# 1.0 (H) 0.1 - 0.9 10e3/uL   Eosinophils Absolute 0.1 0.0 - 0.5 10e3/uL   Basophils Absolute 0.0 0.0 - 0.1 10e3/uL   NEUT% 83.4 (H) 38.4 - 76.8 %    LYMPH% 10.4 (L) 14.0 - 49.7 %   MONO% 5.6 0.0 - 14.0 %   EOS% 0.4 0.0 - 7.0 %   BASO% 0.2 0.0 - 2.0 %  Comprehensive metabolic panel  Result Value Ref Range   Sodium 137 136 - 145 mEq/L   Potassium 3.5 3.5 - 5.1 mEq/L   Chloride 101 98 - 109 mEq/L   CO2 25 22 - 29 mEq/L   Glucose 106 70 - 140 mg/dl   BUN 8.5 7.0 - 26.0 mg/dL   Creatinine 0.8 0.6 - 1.1 mg/dL   Total Bilirubin 0.44 0.20 - 1.20 mg/dL   Alkaline Phosphatase 132 40 - 150 U/L   AST 9 5 - 34 U/L   ALT 10 0 - 55 U/L   Total Protein 6.5 6.4 - 8.3 g/dL   Albumin 2.7 (L) 3.5 - 5.0 g/dL   Calcium 9.2 8.4 - 10.4 mg/dL   Anion Gap 11 3 - 11 mEq/L   EGFR 88 (L) >90 ml/min/1.73 m2    CBC reviewed with patient at time of visit.   Studies/Results:  No results found.  Medications: I have reviewed the patient's current medications. Continue antiemetics, Eliquis,  BP meds, miralax. Ativan refilled today.  DISCUSSION: Meds as above. Gain reviewed use of Zofran every 8 hours as needed with alternating Compazine and Ativan every 6 hours as needed.   Assessment/Plan: 1.metastatic adenocarcinoma of gyn primary, in patient with history of IC clear cell ovarian and IA high grade endometrial carcinomas 03-2008, treated with incomplete staging, TAH BSO, adjuvant carboplatin taxol x 6 cycles and radiation. Present metastatic disease involves large necrotic right inguinal node, paraaortic/ aortocaval/ external iliac nodes, and multiple bilateral pulmonary nodes. 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 First carbo taxotere  02-24-15 with [place] skin test and with neulasta support on day 3. She will follow up with lab on 03-14-15. She will be due cycle 2 carbo taxotere on 03-17-15. Genetics referral . Prn MSIR , could do RT to left psoas area if needed; have not added zometa yet as chemo initiated just now.  2.LLE DVT documented 02-12-15: on Eliquis, leg no longer painful, swelling improved. No  bleeding. Does not have IVC filter. Eliquis approved thru [employer] assistance x 1 year now. 3.severe iron deficiency anemia: etiology not clear. Does not seem to have had heme check stools in hospital (patient tells me this was done in ED, tho I have not located that information in EMR) and UA was negative for blood. Feraheme 02-16-15 and 03-03-15.  4.difficult IV access: really needs PAC for chemo, however placement complicated by Eliquis and acute, extensive LLE DVT. I do not think she should be off anticoagulation x 48 hours at this point, as is IR protocol for PAC and Eliquis. Will either need to manage with peripheral IVs to start + PAC later as outpatient, or possibly PICC. 5.overdue mammograms, general medical care, colonoscopy. Noncompliance with follow up after cancer diagnoses and adjuvant treatment after ~ 2012.  Noncompliant with care for HTN over past several years, which I believe is despite family efforts (other daughter is pediatrician) .  Needs PCP.  6.uncontrolled HTN: required several doses clonidine at time of first chemo, now on labetalol 200 mg tid per urgent care + clonidine 0.1 mg tid and HCTZ. BP 167/76. Needs PCP, or if more problems prior will try to get her to cardiology 7.recent weight loss, in part intentional. Discussed need for good nutrition 8.information on advance directives given 9.flu vaccine 02-21-15 10.chemo nausea and vomiting: will discuss addition of EMEND/ aloxi with managed care prior to my next visit on 3-13 (not discussed with patient yet). Increase zofran to 8 mg q 8 hr prn now. 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 12.Social: Hopefully medicaid pending, no notes in this regard that I find from [facility] staff, tho Eliquis information as above. Message to [facility] managed care.  All questions answered and patient/ daughter express appreciation for care.  Time spent 30 min including >50% counseling and  coordination of care.    Mikey Bussing, NP   03/07/2015, 11:11 PM

## 2015-03-11 ENCOUNTER — Telehealth: Payer: Self-pay

## 2015-03-11 ENCOUNTER — Telehealth: Payer: Self-pay | Admitting: Genetic Counselor

## 2015-03-11 NOTE — Telephone Encounter (Signed)
-----   Message from Gordy Levan, MD sent at 03/05/2015  8:50 AM EST ----- Please contact [place] [facility] with [facility], as I believe they were to resume taking new patients in March.  If so, please make new patient referral for this lady  thanks

## 2015-03-11 NOTE — Telephone Encounter (Signed)
[name] stated that she Called and left a message in [name]'s voice mail.  She has not received a return call from patient. The clinic only calls patient once.

## 2015-03-11 NOTE — Telephone Encounter (Signed)
Lt mess regarding scheduling genetic counseling to match current schedule.

## 2015-03-11 NOTE — Telephone Encounter (Signed)
Left a message in [name]'s voice mail that Dr. Marko Plume made a referral to the [facility] clinic for follow up of BP and other primary care issues. She has not responded to the VM that the clinic left ~ 2 days ago to schedule an appointment. Will notify Dr. Marko Plume that an appointment has not yet been scheduled as this could be further discussed at her visit with Dr. Marko Plume on 03-14-15.

## 2015-03-12 ENCOUNTER — Other Ambulatory Visit: Payer: Self-pay | Admitting: Oncology

## 2015-03-12 DIAGNOSIS — C541 Malignant neoplasm of endometrium: Secondary | ICD-10-CM

## 2015-03-14 ENCOUNTER — Encounter: Payer: Self-pay | Admitting: Oncology

## 2015-03-14 ENCOUNTER — Telehealth: Payer: Self-pay

## 2015-03-14 ENCOUNTER — Other Ambulatory Visit (HOSPITAL_BASED_OUTPATIENT_CLINIC_OR_DEPARTMENT_OTHER): Payer: Self-pay

## 2015-03-14 ENCOUNTER — Ambulatory Visit (HOSPITAL_BASED_OUTPATIENT_CLINIC_OR_DEPARTMENT_OTHER): Payer: Self-pay | Admitting: Oncology

## 2015-03-14 ENCOUNTER — Other Ambulatory Visit: Payer: Self-pay

## 2015-03-14 VITALS — BP 150/66 | HR 72 | Temp 98.6°F | Resp 18 | Ht 62.0 in | Wt 170.3 lb

## 2015-03-14 DIAGNOSIS — I82402 Acute embolism and thrombosis of unspecified deep veins of left lower extremity: Secondary | ICD-10-CM

## 2015-03-14 DIAGNOSIS — Z8543 Personal history of malignant neoplasm of ovary: Secondary | ICD-10-CM

## 2015-03-14 DIAGNOSIS — C7800 Secondary malignant neoplasm of unspecified lung: Secondary | ICD-10-CM

## 2015-03-14 DIAGNOSIS — C569 Malignant neoplasm of unspecified ovary: Secondary | ICD-10-CM

## 2015-03-14 DIAGNOSIS — C541 Malignant neoplasm of endometrium: Secondary | ICD-10-CM

## 2015-03-14 DIAGNOSIS — D509 Iron deficiency anemia, unspecified: Secondary | ICD-10-CM

## 2015-03-14 DIAGNOSIS — R112 Nausea with vomiting, unspecified: Secondary | ICD-10-CM

## 2015-03-14 DIAGNOSIS — G893 Neoplasm related pain (acute) (chronic): Secondary | ICD-10-CM

## 2015-03-14 DIAGNOSIS — T451X5A Adverse effect of antineoplastic and immunosuppressive drugs, initial encounter: Secondary | ICD-10-CM

## 2015-03-14 DIAGNOSIS — C801 Malignant (primary) neoplasm, unspecified: Secondary | ICD-10-CM

## 2015-03-14 DIAGNOSIS — C7989 Secondary malignant neoplasm of other specified sites: Secondary | ICD-10-CM

## 2015-03-14 DIAGNOSIS — C778 Secondary and unspecified malignant neoplasm of lymph nodes of multiple regions: Secondary | ICD-10-CM

## 2015-03-14 DIAGNOSIS — Z8542 Personal history of malignant neoplasm of other parts of uterus: Secondary | ICD-10-CM

## 2015-03-14 DIAGNOSIS — I878 Other specified disorders of veins: Secondary | ICD-10-CM

## 2015-03-14 DIAGNOSIS — I10 Essential (primary) hypertension: Secondary | ICD-10-CM

## 2015-03-14 DIAGNOSIS — Z7901 Long term (current) use of anticoagulants: Secondary | ICD-10-CM

## 2015-03-14 DIAGNOSIS — G620 Drug-induced polyneuropathy: Secondary | ICD-10-CM

## 2015-03-14 LAB — CBC WITH DIFFERENTIAL/PLATELET
BASO%: 1.1 % (ref 0.0–2.0)
Basophils Absolute: 0.1 10*3/uL (ref 0.0–0.1)
EOS ABS: 0.2 10*3/uL (ref 0.0–0.5)
EOS%: 1.8 % (ref 0.0–7.0)
HCT: 28.2 % — ABNORMAL LOW (ref 34.8–46.6)
HEMOGLOBIN: 8.8 g/dL — AB (ref 11.6–15.9)
LYMPH%: 9.7 % — AB (ref 14.0–49.7)
MCH: 23.3 pg — ABNORMAL LOW (ref 25.1–34.0)
MCHC: 31.3 g/dL — ABNORMAL LOW (ref 31.5–36.0)
MCV: 74.5 fL — AB (ref 79.5–101.0)
MONO#: 1.2 10*3/uL — ABNORMAL HIGH (ref 0.1–0.9)
MONO%: 8.5 % (ref 0.0–14.0)
NEUT%: 78.9 % — ABNORMAL HIGH (ref 38.4–76.8)
NEUTROS ABS: 11 10*3/uL — AB (ref 1.5–6.5)
Platelets: 210 10*3/uL (ref 145–400)
RBC: 3.78 10*6/uL (ref 3.70–5.45)
RDW: 25.8 % — AB (ref 11.2–14.5)
WBC: 13.9 10*3/uL — AB (ref 3.9–10.3)
lymph#: 1.3 10*3/uL (ref 0.9–3.3)

## 2015-03-14 LAB — COMPREHENSIVE METABOLIC PANEL
ALBUMIN: 2.8 g/dL — AB (ref 3.5–5.0)
ALK PHOS: 144 U/L (ref 40–150)
ALT: 10 U/L (ref 0–55)
AST: 11 U/L (ref 5–34)
Anion Gap: 9 mEq/L (ref 3–11)
BILIRUBIN TOTAL: 0.44 mg/dL (ref 0.20–1.20)
BUN: 6.5 mg/dL — AB (ref 7.0–26.0)
CO2: 22 meq/L (ref 22–29)
CREATININE: 0.9 mg/dL (ref 0.6–1.1)
Calcium: 9.4 mg/dL (ref 8.4–10.4)
Chloride: 105 mEq/L (ref 98–109)
EGFR: 78 mL/min/{1.73_m2} — ABNORMAL LOW (ref >90)
GLUCOSE: 109 mg/dL (ref 70–140)
Potassium: 3.7 mEq/L (ref 3.5–5.1)
SODIUM: 137 meq/L (ref 136–145)
TOTAL PROTEIN: 7.3 g/dL (ref 6.4–8.3)

## 2015-03-14 MED ORDER — MORPHINE SULFATE 15 MG PO TABS: 15.0000 mg | ORAL_TABLET | ORAL | Status: DC | PRN

## 2015-03-14 MED ORDER — PANTOPRAZOLE SODIUM 40 MG PO TBEC: 40.0000 mg | DELAYED_RELEASE_TABLET | Freq: Every day | ORAL | Status: DC

## 2015-03-14 NOTE — Telephone Encounter (Signed)
-----   Message from Gordy Levan, MD sent at 03/14/2015 10:48 AM EDT ----- Protonix 40 mg daily for stomach irritation #30  2 RF  Or pharmacy can substitute other prescription if insurance prefers

## 2015-03-14 NOTE — Progress Notes (Signed)
OFFICE PROGRESS NOTE   March 14, 2015   Physicians: E.Rossi. New patient referral made to [facility] for PCP  INTERVAL HISTORY:  Patient is seen, together with daughter, in continuing attention to metastatic gyn carcinoma involving lungs, pelvis, inguinal and retroperitoneal nodes, left psoas to L4-5. She is due cycle 2 carboplatin taxotere on 03-17-15, with neulasta support.  On Eliquis for LLE DVT.  Patient complains of continued nausea even out nearly 3 weeks now from first chemotherapy. Ativan has been most helpful, no improvement even with 8 mg dose of zofran. Nausea is intermittent but daily, no severe GERD, no HA, no bladder symptoms. She is usually able to eat and to drink fluids. Bowels seem to be moving adequately. Lower back pain is controlled with MSIR usually just once daily, no new or different pain. She is able to sleep and is up thru day at home moreso in past week than previously per daughter. She has had no fever, no bleeding, less LE swelling. Blood pressures seem better, still on antihypertensives as increased by urgent care. Daughter wonders if nausea is related to clonidine. Patient still has not established with any PCP, and I have strongly encouraged her to follow up with [facility], as they are again accepting new patients now.  Remainder of 10 point Review of Systems negative/ unchanged.    No central catheter Referral made for genetics Flu vaccine done today CA 125 on 02-14-15 642.9 Feraheme on 02-16-15 and 03-03-15  ONCOLOGIC HISTORY In 2010 patient had technically unstaged at least IC clear cell ovarian carcinoma and synchronous at least IC high grade endometrial carcinoma treated with TAH BSO and omental biopsy by gynecologist 03-2008, then adjuvant carboplatin taxol x 6 cycles completed 08-2008 ( [facility]) and pelvic radiation + vaginal brachytherapy completed 12-2008 ( [place]).  Last follow up visit for the gyn cancers  appears to have been with Dr Sondra Come in 11-2010, a lost to follow up since then until presented with recurrent disease 02-2015.   Patient reports right inguinal mass for ~ 2 months, then LLE pain for ~ 3.5 weeks prior to presenting to ED on 02-12-15. She had recently made some changes in diet (a "Ileene Musa" thru her church with only fruits and vegetables), but also appetite not great so that she lost "2 dress sizes" in past 1-2 months. Only pain was in right groin, this improved since necrotic nodal mass was aspirated for 60 cc dark fluid on 02-15-15. She denies fever or other symptoms of infection and had no bleeding PTA. She denies SOB, cough, chest pain, wheezing. She denies HA, other neurologic changes other than drowsiness with medication in hospital. Bowels have been moving, denies melanotic stools. Bladder asymptomatic, apparently no hematuria. Some nausea. Had been less active over last couple of weeks with LE discomfort. Peripheral IV access has not been easy in hospital. In ED on 02-12-15 CT AP showed nodules in lung bases suspicious for metastatic disease, liver not remarkable, no hydronephrosis, 6 mm right renal calculus, thrombus left external iliac vein into left common femoral vein, retroperitoneal and pelvic adenopathy, an 8.5 x 7.1 cm necrotic right inguinal node, post hyst ooph, no ascites, likely tumor involvement in left psoas with adjacent involvement of L4 and L5 vertebral bodies. Patient was admitted, begun on heparin qtt which was transitioned to Eliquis on 02-14-15. She had aspiration and core needle biopsy of right inguinal nodal mass on 02-15-15. CA 125 from 02-14-15 was 642.9, this having been  3.6 in 08-2010. She was seen in consultation by Dr Denman George on 02-14-15. Pathology finalized today shows metastatic adenocarcinoma with immunostains consistent with gyn primary ([id number]). She was transfused 2 units PRBCs for hgb 6.7, and begun on oral iron. Iron studies 2-12 with serum iron 14 and %sat  7. First carbo taxotere given 02-24-15  Objective:  Vital signs in last 24 hours:  BP 150/66 mmHg  Pulse 72  Temp(Src) 98.6 F (37 C) (Oral)  Resp 18  Ht 5' 2"  (1.575 m)  Wt 170 lb 4.8 oz (77.248 kg)  BMI 31.14 kg/m2  SpO2 100% Weight is down 4 lbs Alert, oriented and appropriate. Ambulatory without assistance. A little drowsy now since MSIR prior to coming to visit, but appropriate conversation.  Alopecia  HEENT:PERRL, sclerae not icteric. Oral mucosa moist without lesions, posterior pharynx clear. Partial alopecia Neck supple. No JVD.  Lymphatics:fullness left supraclavicular. Did not examine right inguinal node mass today. Resp: rather diminished BS thruout otherwise clear to auscultation bilaterally and no dullness to percussion bilaterally Cardio: regular rate and rhythm. No gallop. GI: soft, nontender including epigastrium, not distended, no mass or organomegaly. Some bowel sounds. Surgical incision not remarkable. Musculoskeletal/ Extremities: Minimal LLE edema, otherwise bilaterally without cords, tenderness Neuro: no increase in baseline peripheral neuropathy. Otherwise speech fluent, moves all extremities equally, nonfocal. PSYCH mood and affect seem appropriate Skin without rash, ecchymosis, petechiae   Lab Results:  Results for orders placed or performed in visit on 03/14/15  CBC with Differential  Result Value Ref Range   WBC 13.9 (H) 3.9 - 10.3 10e3/uL   NEUT# 11.0 (H) 1.5 - 6.5 10e3/uL   HGB 8.8 (L) 11.6 - 15.9 g/dL   HCT 28.2 (L) 34.8 - 46.6 %   Platelets 210 145 - 400 10e3/uL   MCV 74.5 (L) 79.5 - 101.0 fL   MCH 23.3 (L) 25.1 - 34.0 pg   MCHC 31.3 (L) 31.5 - 36.0 g/dL   RBC 3.78 3.70 - 5.45 10e6/uL   RDW 25.8 (H) 11.2 - 14.5 %   lymph# 1.3 0.9 - 3.3 10e3/uL   MONO# 1.2 (H) 0.1 - 0.9 10e3/uL   Eosinophils Absolute 0.2 0.0 - 0.5 10e3/uL   Basophils Absolute 0.1 0.0 - 0.1 10e3/uL   NEUT% 78.9 (H) 38.4 - 76.8 %   LYMPH% 9.7 (L) 14.0 - 49.7 %   MONO% 8.5  0.0 - 14.0 %   EOS% 1.8 0.0 - 7.0 %   BASO% 1.1 0.0 - 2.0 %  Comprehensive metabolic panel  Result Value Ref Range   Sodium 137 136 - 145 mEq/L   Potassium 3.7 3.5 - 5.1 mEq/L   Chloride 105 98 - 109 mEq/L   CO2 22 22 - 29 mEq/L   Glucose 109 70 - 140 mg/dl   BUN 6.5 (L) 7.0 - 26.0 mg/dL   Creatinine 0.9 0.6 - 1.1 mg/dL   Total Bilirubin 0.44 0.20 - 1.20 mg/dL   Alkaline Phosphatase 144 40 - 150 U/L   AST 11 5 - 34 U/L   ALT 10 0 - 55 U/L   Total Protein 7.3 6.4 - 8.3 g/dL   Albumin 2.8 (L) 3.5 - 5.0 g/dL   Calcium 9.4 8.4 - 10.4 mg/dL   Anion Gap 9 3 - 11 mEq/L   EGFR 78 (L) >90 ml/min/1.73 m2   CA 125 available after visit 427.8, this having been 642.9 on 02-14-15  Studies/Results:  No results found.  Medications: I have reviewed the patient's current  medications. Add protonix 40 mg daily. Reviewed decadron 8 mg bid x 3 days beginning day prior to taxotere. OK to stop po iron as she has had full dose feraheme  DISCUSSION Doubt this much nausea now is related to the first chemotherapy. OK to continue prn antiemetics and will add daily protonix. If nausea continues, would image head.   Discussed radiation to psoas/ L4-5 area if symptoms very bothersome, tho is not needing any regular medication for this now.   Needs PCP to assist with BP management  Assessment/Plan: 1.metastatic adenocarcinoma of gyn primary, in patient with history of IC clear cell ovarian and IA high grade endometrial carcinomas 03-2008, treated with incomplete staging, TAH BSO, adjuvant carboplatin taxol x 6 cycles and radiation. Present metastatic disease involves large necrotic right inguinal node, paraaortic/ aortocaval/ external iliac nodes, and multiple bilateral pulmonary nodes. 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 Counts ok for cycle 2 carbo taxotere on 03-17-15, with carbo skin test and neulasta day 3. Genetics referral .  Prn MSIR , could do RT to  left psoas area if needed; have not added zometa yet as chemo initiated just now.  2.LLE DVT documented 02-12-15: on Eliquis, leg no longer painful, swelling improved. No bleeding. Does not have IVC filter. Eliquis approved thru [employer] assistance x 1 year now. 3.severe iron deficiency anemia: etiology not clear. Does not seem to have had heme check stools in hospital (patient tells me this was done in ED, tho I have not located that information in EMR) and UA was negative for blood. Feraheme x 2 doses done. Hemoglobin stable at 8.8, would need PRBCs if <8 or more symptoms. 4.difficult IV access: needs PAC , however placement complicated by Eliquis and acute, extensive LLE DVT. I do not think she should be off anticoagulation x 48 hours at this point, as is IR protocol for PAC and Eliquis. Will either need to manage with peripheral IVs to start + PAC later as outpatient, or possibly PICC. 5. chemo nausea and vomiting: Will use aloxi with cycle 2 unless I hear differently from managed care, will need pharmacy to review antiemetics with aloxi..  Add Protonix as persistent nausea now seems too far out from chemo to be only chemo related 6.uncontrolled HTN initially: required several doses clonidine at time of first chemo, now on labetalol 200 mg tid per urgent care + clonidine 0.1 mg tid and HCTZ. BP much better now, 150/66 today. Needs PCP, or if more problems prior will try to get her to cardiology 7.recent weight loss, po intake difficult with nausea. Will ask [place] nutritionist to see. 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 Stable 11.Social: Hopefully medicaid pending 12.overdue mammograms, general medical care, colonoscopy. Noncompliance with follow up after cancer diagnoses and adjuvant treatment after ~ 2012. Noncompliant with care for HTN over past several years, which I believe is  despite family efforts (other daughter is pediatrician) Needs PCP, see above.  All questions answered and they know to call prior to next scheduled visit if needed. Chemo and neulasta confirmed, antiemetics ordered, request to pharmacy to follow up at at treatment if use aloxi. Time spent 30 min including >50% counseling and coordination of care.    LIVESAY,LENNIS P, MD   03/14/2015, 5:20 PM

## 2015-03-15 ENCOUNTER — Telehealth: Payer: Self-pay

## 2015-03-15 LAB — CA 125: Cancer Antigen (CA) 125: 427.8 U/mL — ABNORMAL HIGH (ref 0.0–38.1)

## 2015-03-15 NOTE — Telephone Encounter (Signed)
-----   Message from Gordy Levan, MD sent at 03/15/2015  8:00 AM EDT ----- Labs seen and need follow up: please let her know CA 125 is some better since start of chemo - had been 642 when she was in hospital in Feb, down to 427 on 03-14-15.

## 2015-03-15 NOTE — Telephone Encounter (Signed)
S/w pt that she will need to call [facility]. They only call the pt once. She said she will call them. She said she has the number. I explained that at Dr Edwyna Shell appt yesterday she seemed not very interested, she was sleepy from her pain medication at that time.

## 2015-03-15 NOTE — Telephone Encounter (Signed)
lvm with CA 125 results

## 2015-03-17 ENCOUNTER — Encounter: Payer: Self-pay | Admitting: Oncology

## 2015-03-17 ENCOUNTER — Ambulatory Visit (HOSPITAL_BASED_OUTPATIENT_CLINIC_OR_DEPARTMENT_OTHER): Payer: Self-pay

## 2015-03-17 ENCOUNTER — Other Ambulatory Visit (HOSPITAL_BASED_OUTPATIENT_CLINIC_OR_DEPARTMENT_OTHER): Payer: Self-pay

## 2015-03-17 ENCOUNTER — Other Ambulatory Visit: Payer: Self-pay | Admitting: Oncology

## 2015-03-17 VITALS — BP 148/65 | HR 67 | Temp 97.5°F | Resp 18

## 2015-03-17 DIAGNOSIS — C801 Malignant (primary) neoplasm, unspecified: Secondary | ICD-10-CM

## 2015-03-17 DIAGNOSIS — C7800 Secondary malignant neoplasm of unspecified lung: Secondary | ICD-10-CM

## 2015-03-17 DIAGNOSIS — C778 Secondary and unspecified malignant neoplasm of lymph nodes of multiple regions: Secondary | ICD-10-CM

## 2015-03-17 DIAGNOSIS — Z5111 Encounter for antineoplastic chemotherapy: Secondary | ICD-10-CM

## 2015-03-17 DIAGNOSIS — C541 Malignant neoplasm of endometrium: Secondary | ICD-10-CM

## 2015-03-17 DIAGNOSIS — C7989 Secondary malignant neoplasm of other specified sites: Secondary | ICD-10-CM

## 2015-03-17 LAB — CBC WITH DIFFERENTIAL/PLATELET
BASO%: 0.2 % (ref 0.0–2.0)
Basophils Absolute: 0 10*3/uL (ref 0.0–0.1)
EOS%: 0 % (ref 0.0–7.0)
Eosinophils Absolute: 0 10*3/uL (ref 0.0–0.5)
HEMATOCRIT: 28.3 % — AB (ref 34.8–46.6)
HGB: 9 g/dL — ABNORMAL LOW (ref 11.6–15.9)
LYMPH#: 0.8 10*3/uL — AB (ref 0.9–3.3)
LYMPH%: 2.9 % — ABNORMAL LOW (ref 14.0–49.7)
MCH: 23.4 pg — ABNORMAL LOW (ref 25.1–34.0)
MCHC: 31.7 g/dL (ref 31.5–36.0)
MCV: 73.8 fL — ABNORMAL LOW (ref 79.5–101.0)
MONO#: 0.9 10*3/uL (ref 0.1–0.9)
MONO%: 3.4 % (ref 0.0–14.0)
NEUT%: 93.5 % — AB (ref 38.4–76.8)
NEUTROS ABS: 23.9 10*3/uL — AB (ref 1.5–6.5)
PLATELETS: 362 10*3/uL (ref 145–400)
RBC: 3.84 10*6/uL (ref 3.70–5.45)
RDW: 26.2 % — AB (ref 11.2–14.5)
WBC: 25.5 10*3/uL — AB (ref 3.9–10.3)

## 2015-03-17 LAB — COMPREHENSIVE METABOLIC PANEL
Albumin: 2.9 g/dL — ABNORMAL LOW (ref 3.5–5.0)
Alkaline Phosphatase: 135 U/L (ref 40–150)
Anion Gap: 10 mEq/L (ref 3–11)
BILIRUBIN TOTAL: 0.33 mg/dL (ref 0.20–1.20)
BUN: 11.8 mg/dL (ref 7.0–26.0)
CALCIUM: 10.1 mg/dL (ref 8.4–10.4)
CHLORIDE: 106 meq/L (ref 98–109)
CO2: 21 meq/L — AB (ref 22–29)
CREATININE: 0.9 mg/dL (ref 0.6–1.1)
EGFR: 83 mL/min/{1.73_m2} — ABNORMAL LOW (ref >90)
Glucose: 158 mg/dl — ABNORMAL HIGH (ref 70–140)
Potassium: 3.8 mEq/L (ref 3.5–5.1)
Sodium: 138 mEq/L (ref 136–145)
TOTAL PROTEIN: 7.7 g/dL (ref 6.4–8.3)

## 2015-03-17 MED ORDER — CARBOPLATIN CHEMO INTRADERMAL TEST DOSE 100MCG/0.02ML
100.0000 ug | Freq: Once | INTRADERMAL | Status: AC
  Administered 2015-03-17: 100 ug via INTRADERMAL
  Filled 2015-03-17: qty 0.01

## 2015-03-17 MED ORDER — LORAZEPAM 1 MG PO TABS
ORAL_TABLET | ORAL | Status: AC
  Filled 2015-03-17: qty 1

## 2015-03-17 MED ORDER — LORAZEPAM 1 MG PO TABS
0.5000 mg | ORAL_TABLET | Freq: Once | ORAL | Status: AC | PRN
  Administered 2015-03-17: 0.5 mg via ORAL

## 2015-03-17 MED ORDER — PALONOSETRON HCL INJECTION 0.25 MG/5ML
0.2500 mg | Freq: Once | INTRAVENOUS | Status: AC
  Administered 2015-03-17: 0.25 mg via INTRAVENOUS

## 2015-03-17 MED ORDER — SODIUM CHLORIDE 0.9 % IV SOLN
10.0000 mg | Freq: Once | INTRAVENOUS | Status: AC
  Administered 2015-03-17: 10 mg via INTRAVENOUS
  Filled 2015-03-17: qty 1

## 2015-03-17 MED ORDER — SODIUM CHLORIDE 0.9 % IV SOLN
Freq: Once | INTRAVENOUS | Status: AC
  Administered 2015-03-17: 12:00:00 via INTRAVENOUS

## 2015-03-17 MED ORDER — DOCETAXEL CHEMO INJECTION 160 MG/16ML
60.0000 mg/m2 | Freq: Once | INTRAVENOUS | Status: AC
  Administered 2015-03-17: 110 mg via INTRAVENOUS
  Filled 2015-03-17: qty 11

## 2015-03-17 MED ORDER — CARBOPLATIN CHEMO INJECTION 600 MG/60ML
537.5000 mg | Freq: Once | INTRAVENOUS | Status: AC
  Administered 2015-03-17: 540 mg via INTRAVENOUS
  Filled 2015-03-17: qty 54

## 2015-03-17 MED ORDER — PALONOSETRON HCL INJECTION 0.25 MG/5ML
INTRAVENOUS | Status: AC
  Filled 2015-03-17: qty 5

## 2015-03-17 NOTE — Patient Instructions (Signed)
[facility] Discharge Instructions for Patients Receiving Chemotherapy  Today you received the following chemotherapy agents Carboplatin/taxotere.   To help prevent nausea and vomiting after your treatment, we encourage you to take your nausea medication as directed.    If you develop nausea and vomiting that is not controlled by your nausea medication, call the clinic.   BELOW ARE SYMPTOMS THAT SHOULD BE REPORTED IMMEDIATELY:  *FEVER GREATER THAN 100.5 F  *CHILLS WITH OR WITHOUT FEVER  NAUSEA AND VOMITING THAT IS NOT CONTROLLED WITH YOUR NAUSEA MEDICATION  *UNUSUAL SHORTNESS OF BREATH  *UNUSUAL BRUISING OR BLEEDING  TENDERNESS IN MOUTH AND THROAT WITH OR WITHOUT PRESENCE OF ULCERS  *URINARY PROBLEMS  *BOWEL PROBLEMS  UNUSUAL RASH Items with * indicate a potential emergency and should be followed up as soon as possible.  Feel free to call the clinic you have any questions or concerns. The clinic phone number is [phone].  Please show the [place] at check-in to the Emergency Department and triage nurse.

## 2015-03-17 NOTE — Progress Notes (Signed)
Reviewed patient' s notes and went to meet with her in tx. I introduced myself as Estate manager/land agent and asked if she had received correspondence from the hospital counselors to contact them regarding applying for Medicaid and [employer]. Patient states she does not know if she had or not. Consulted with the counselors and they explained that they were trying to help her as I knew they were. I gave patient both applications and the numbers to Ivin Booty and Vicente Males to get the information to them so they can start the process. Patient has my card for any additional financial questions or concerns.

## 2015-03-19 ENCOUNTER — Ambulatory Visit (HOSPITAL_BASED_OUTPATIENT_CLINIC_OR_DEPARTMENT_OTHER): Payer: Self-pay

## 2015-03-19 VITALS — BP 173/79 | HR 94 | Temp 98.7°F | Resp 20

## 2015-03-19 DIAGNOSIS — C541 Malignant neoplasm of endometrium: Secondary | ICD-10-CM

## 2015-03-19 DIAGNOSIS — C801 Malignant (primary) neoplasm, unspecified: Secondary | ICD-10-CM

## 2015-03-19 DIAGNOSIS — Z5189 Encounter for other specified aftercare: Secondary | ICD-10-CM

## 2015-03-19 DIAGNOSIS — C7800 Secondary malignant neoplasm of unspecified lung: Secondary | ICD-10-CM

## 2015-03-19 MED ORDER — PEGFILGRASTIM INJECTION 6 MG/0.6ML
6.0000 mg | Freq: Once | SUBCUTANEOUS | Status: AC
  Administered 2015-03-19: 6 mg via SUBCUTANEOUS

## 2015-03-19 NOTE — Patient Instructions (Signed)
Pegfilgrastim injection What is this medicine? PEGFILGRASTIM (PEG fil gra stim) is a long-acting granulocyte colony-stimulating factor that stimulates the growth of neutrophils, a type of white blood cell important in the body's fight against infection. It is used to reduce the incidence of fever and infection in patients with certain types of cancer who are receiving chemotherapy that affects the bone marrow, and to increase survival after being exposed to high doses of radiation. This medicine may be used for other purposes; ask your health care provider or pharmacist if you have questions. What should I tell my health care provider before I take this medicine? They need to know if you have any of these conditions: -kidney disease -latex allergy -ongoing radiation therapy -sickle cell disease -skin reactions to acrylic adhesives (On-Body Injector only) -an unusual or allergic reaction to pegfilgrastim, filgrastim, other medicines, foods, dyes, or preservatives -pregnant or trying to get pregnant -breast-feeding How should I use this medicine? This medicine is for injection under the skin. If you get this medicine at home, you will be taught how to prepare and give the pre-filled syringe or how to use the On-body Injector. Refer to the patient Instructions for Use for detailed instructions. Use exactly as directed. Take your medicine at regular intervals. Do not take your medicine more often than directed. It is important that you put your used needles and syringes in a special sharps container. Do not put them in a trash can. If you do not have a sharps container, call your pharmacist or healthcare provider to get one. Talk to your pediatrician regarding the use of this medicine in children. While this drug may be prescribed for selected conditions, precautions do apply. Overdosage: If you think you have taken too much of this medicine contact a poison control center or emergency room at  once. NOTE: This medicine is only for you. Do not share this medicine with others. What if I miss a dose? It is important not to miss your dose. Call your doctor or health care professional if you miss your dose. If you miss a dose due to an On-body Injector failure or leakage, a new dose should be administered as soon as possible using a single prefilled syringe for manual use. What may interact with this medicine? Interactions have not been studied. Give your health care provider a list of all the medicines, herbs, non-prescription drugs, or dietary supplements you use. Also tell them if you smoke, drink alcohol, or use illegal drugs. Some items may interact with your medicine. This list may not describe all possible interactions. Give your health care provider a list of all the medicines, herbs, non-prescription drugs, or dietary supplements you use. Also tell them if you smoke, drink alcohol, or use illegal drugs. Some items may interact with your medicine. What should I watch for while using this medicine? You may need blood work done while you are taking this medicine. If you are going to need a MRI, CT scan, or other procedure, tell your doctor that you are using this medicine (On-Body Injector only). What side effects may I notice from receiving this medicine? Side effects that you should report to your doctor or health care professional as soon as possible: -allergic reactions like skin rash, itching or hives, swelling of the face, lips, or tongue -dizziness -fever -pain, redness, or irritation at site where injected -pinpoint red spots on the skin -red or dark-brown urine -shortness of breath or breathing problems -stomach or side pain, or pain   at the shoulder -swelling -tiredness -trouble passing urine or change in the amount of urine Side effects that usually do not require medical attention (report to your doctor or health care professional if they continue or are  bothersome): -bone pain -muscle pain This list may not describe all possible side effects. Call your doctor for medical advice about side effects. You may report side effects to FDA at 1-800-FDA-1088. Where should I keep my medicine? Keep out of the reach of children. Store pre-filled syringes in a refrigerator between 2 and 8 degrees C (36 and 46 degrees F). Do not freeze. Keep in carton to protect from light. Throw away this medicine if it is left out of the refrigerator for more than 48 hours. Throw away any unused medicine after the expiration date. NOTE: This sheet is a summary. It may not cover all possible information. If you have questions about this medicine, talk to your doctor, pharmacist, or health care provider.    2016, Elsevier/Gold Standard. (2014-01-07 14:30:14)  

## 2015-03-20 ENCOUNTER — Other Ambulatory Visit: Payer: Self-pay | Admitting: Oncology

## 2015-03-20 DIAGNOSIS — C541 Malignant neoplasm of endometrium: Secondary | ICD-10-CM

## 2015-03-24 ENCOUNTER — Encounter: Payer: Self-pay | Admitting: Oncology

## 2015-03-24 ENCOUNTER — Other Ambulatory Visit (HOSPITAL_BASED_OUTPATIENT_CLINIC_OR_DEPARTMENT_OTHER): Payer: Self-pay

## 2015-03-24 ENCOUNTER — Telehealth: Payer: Self-pay | Admitting: Oncology

## 2015-03-24 ENCOUNTER — Telehealth: Payer: Self-pay

## 2015-03-24 ENCOUNTER — Ambulatory Visit (HOSPITAL_BASED_OUTPATIENT_CLINIC_OR_DEPARTMENT_OTHER): Payer: Self-pay | Admitting: Oncology

## 2015-03-24 VITALS — BP 193/77 | HR 113 | Temp 98.8°F | Resp 19 | Wt 172.9 lb

## 2015-03-24 DIAGNOSIS — Z8542 Personal history of malignant neoplasm of other parts of uterus: Secondary | ICD-10-CM

## 2015-03-24 DIAGNOSIS — C541 Malignant neoplasm of endometrium: Secondary | ICD-10-CM

## 2015-03-24 DIAGNOSIS — C778 Secondary and unspecified malignant neoplasm of lymph nodes of multiple regions: Secondary | ICD-10-CM

## 2015-03-24 DIAGNOSIS — C801 Malignant (primary) neoplasm, unspecified: Secondary | ICD-10-CM

## 2015-03-24 DIAGNOSIS — Z7901 Long term (current) use of anticoagulants: Secondary | ICD-10-CM

## 2015-03-24 DIAGNOSIS — T451X5A Adverse effect of antineoplastic and immunosuppressive drugs, initial encounter: Secondary | ICD-10-CM

## 2015-03-24 DIAGNOSIS — I878 Other specified disorders of veins: Secondary | ICD-10-CM

## 2015-03-24 DIAGNOSIS — D500 Iron deficiency anemia secondary to blood loss (chronic): Secondary | ICD-10-CM

## 2015-03-24 DIAGNOSIS — C7801 Secondary malignant neoplasm of right lung: Secondary | ICD-10-CM

## 2015-03-24 DIAGNOSIS — C7802 Secondary malignant neoplasm of left lung: Secondary | ICD-10-CM

## 2015-03-24 DIAGNOSIS — G620 Drug-induced polyneuropathy: Secondary | ICD-10-CM

## 2015-03-24 DIAGNOSIS — R112 Nausea with vomiting, unspecified: Secondary | ICD-10-CM

## 2015-03-24 DIAGNOSIS — I82402 Acute embolism and thrombosis of unspecified deep veins of left lower extremity: Secondary | ICD-10-CM

## 2015-03-24 DIAGNOSIS — Z8543 Personal history of malignant neoplasm of ovary: Secondary | ICD-10-CM

## 2015-03-24 DIAGNOSIS — I10 Essential (primary) hypertension: Secondary | ICD-10-CM

## 2015-03-24 DIAGNOSIS — D509 Iron deficiency anemia, unspecified: Secondary | ICD-10-CM

## 2015-03-24 DIAGNOSIS — C7989 Secondary malignant neoplasm of other specified sites: Secondary | ICD-10-CM

## 2015-03-24 DIAGNOSIS — C569 Malignant neoplasm of unspecified ovary: Secondary | ICD-10-CM

## 2015-03-24 LAB — COMPREHENSIVE METABOLIC PANEL
ALT: 14 U/L (ref 0–55)
ANION GAP: 8 meq/L (ref 3–11)
AST: 10 U/L (ref 5–34)
Albumin: 2.9 g/dL — ABNORMAL LOW (ref 3.5–5.0)
Alkaline Phosphatase: 129 U/L (ref 40–150)
BUN: 8.7 mg/dL (ref 7.0–26.0)
CHLORIDE: 102 meq/L (ref 98–109)
CO2: 29 meq/L (ref 22–29)
Calcium: 9.4 mg/dL (ref 8.4–10.4)
Creatinine: 0.8 mg/dL (ref 0.6–1.1)
EGFR: 88 mL/min/{1.73_m2} — AB (ref >90)
Glucose: 118 mg/dl (ref 70–140)
Potassium: 2.9 mEq/L — CL (ref 3.5–5.1)
SODIUM: 139 meq/L (ref 136–145)
Total Bilirubin: 0.41 mg/dL (ref 0.20–1.20)
Total Protein: 6.7 g/dL (ref 6.4–8.3)

## 2015-03-24 LAB — CBC WITH DIFFERENTIAL/PLATELET
BASO%: 0.7 % (ref 0.0–2.0)
Basophils Absolute: 0 10*3/uL (ref 0.0–0.1)
EOS%: 5.8 % (ref 0.0–7.0)
Eosinophils Absolute: 0.3 10*3/uL (ref 0.0–0.5)
HCT: 26.1 % — ABNORMAL LOW (ref 34.8–46.6)
HGB: 8.5 g/dL — ABNORMAL LOW (ref 11.6–15.9)
LYMPH#: 1 10*3/uL (ref 0.9–3.3)
LYMPH%: 18.4 % (ref 14.0–49.7)
MCH: 24.1 pg — ABNORMAL LOW (ref 25.1–34.0)
MCHC: 32.7 g/dL (ref 31.5–36.0)
MCV: 73.8 fL — AB (ref 79.5–101.0)
MONO#: 0.9 10*3/uL (ref 0.1–0.9)
MONO%: 17 % — AB (ref 0.0–14.0)
NEUT#: 3 10*3/uL (ref 1.5–6.5)
NEUT%: 58.1 % (ref 38.4–76.8)
PLATELETS: 350 10*3/uL (ref 145–400)
RBC: 3.54 10*6/uL — AB (ref 3.70–5.45)
RDW: 26.5 % — ABNORMAL HIGH (ref 11.2–14.5)
WBC: 5.2 10*3/uL (ref 3.9–10.3)

## 2015-03-24 MED ORDER — POTASSIUM CHLORIDE ER 10 MEQ PO TBCR: EXTENDED_RELEASE_TABLET | ORAL | Status: DC

## 2015-03-24 NOTE — Telephone Encounter (Signed)
Per 3/23 pof cxd 3/27 lab/fu and added lab/fu for 4/20. Also added 4*27 tx and 4/29 injection. Left message for patient and also confirmed next appointment already on schedule for 4/3 @ 1 pm. Patient to get new schedule 4/3.

## 2015-03-24 NOTE — Progress Notes (Signed)
OFFICE PROGRESS NOTE   March 24, 2015   Physicians: E.Rossi. 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 She had cycle 2 carbo taxotere on 03-17-15, with neulasta 03-19-15. She has tolerated chemo without acute problems. Patient continues Eliquis for LLE DVT found 02-12-15, related to adenopathy.   She is to call back to [facility] on April 3, trying to get new patient appointment for PCP.  Nausea has been better with protonix daily, which she will continue. She has still had occasional nausea, ativan more helpful than other prn antiemetics. Bowels are not moving well, tho she did have a small BM today, and she will increase present once daily miralax to bid. She had only mild aches from neulasta. Back pain seems a little better, using pain medication less than one daily, last was last pm. She stopped oral iron after feraheme. She has had no bleeding. Swelling in LLE minimal now, and no pain in that leg. She denies SOB, cough or chest pain. Appetite is better since nausea improved. No fever or symptoms of infection. Peripheral IV access was easily accomplished for most recent chemo and blood draw. 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 NOTE she did not take BP meds prior to visit today Remainder of 10 point Review of Systems negative.     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 Last follow up visit for the gyn cancers appears to have been with Dr Sondra Come in 11-2010, a lost to follow up since then until presented with recurrent disease 02-2015.   Patient reports right inguinal mass for ~ 2 months, then LLE pain for ~ 3.5 weeks prior to presenting to ED on 02-12-15. She had recently made some changes in diet (a "Ileene Musa" thru her church with only fruits and vegetables), but also appetite not great so that she lost "2 dress sizes" in past 1-2 months. Only pain was in right groin, this improved since necrotic nodal mass was aspirated for 60 cc dark fluid on 02-15-15. She denies fever or other symptoms of infection and had no bleeding PTA. She denies SOB, cough, chest pain, wheezing. She denies HA, other neurologic changes other than drowsiness with medication in hospital. Bowels have been moving, denies melanotic stools. Bladder asymptomatic, apparently no hematuria. Some nausea. Had been less active over last couple of weeks with LE discomfort. Peripheral IV access has not been easy in hospital. In ED on 02-12-15 CT AP showed nodules in lung bases suspicious for metastatic disease, liver not remarkable, no hydronephrosis, 6 mm right renal calculus, thrombus left external iliac vein into left common femoral vein, retroperitoneal and pelvic adenopathy, an 8.5 x 7.1 cm necrotic right inguinal node, post hyst ooph, no ascites, likely tumor involvement in left psoas with adjacent involvement of L4 and L5 vertebral bodies. Patient was admitted, begun on heparin qtt which was transitioned to  Eliquis on 02-14-15. She had aspiration and core needle biopsy of right inguinal nodal mass on 02-15-15. CA 125 from 02-14-15 was 642.9, this having been 3.6 in 08-2010. She was seen in consultation by Dr Denman George on 02-14-15. Pathology finalized today shows metastatic adenocarcinoma with immunostains consistent with gyn  primary ([id number]). She was transfused 2 units PRBCs for hgb 6.7, and begun on oral iron. Iron studies 2-12 with serum iron 14 and %sat 7. First carbo taxotere given 02-24-15   Objective:  Vital signs in last 24 hours:  BP 193/77 mmHg  Pulse 113  Temp(Src) 98.8 F (37.1 C) (Oral)  Resp 19  Wt 172 lb 14.4 oz (78.427 kg)  SpO2 100% Weight up almost 3 lbs Alert, oriented and appropriate. Ambulatory without assistance.   HEENT:PERRL, sclerae not icteric. Oral mucosa moist without lesions, posterior pharynx clear.  Neck supple. No JVD.  Lymphatics:no supraclavicular adenopathy. Right inguinal mass softer and may be less prominent than just after aspiration in hospital. Not tender to gentle exam now. Resp: no wheezes or crackles bilaterally and no dullness to percussion bilaterally Cardio: regular rate and rhythm. No gallop. GI: soft, nontender, not distended, no mass or organomegaly. Diminished bowel sounds. Surgical incision not remarkable. Musculoskeletal/ Extremities: LLE minimally larger than RLE, without cords, tenderness Neuro: no increase in baseline peripheral neuropathy. Otherwise nonfocal.  PSYCH brighter mood and affect Skin without rash, ecchymosis, petechiae   Lab Results:  Results for orders placed or performed in visit on 03/24/15  CBC with Differential  Result Value Ref Range   WBC 5.2 3.9 - 10.3 10e3/uL   NEUT# 3.0 1.5 - 6.5 10e3/uL   HGB 8.5 (L) 11.6 - 15.9 g/dL   HCT 26.1 (L) 34.8 - 46.6 %   Platelets 350 145 - 400 10e3/uL   MCV 73.8 (L) 79.5 - 101.0 fL   MCH 24.1 (L) 25.1 - 34.0 pg   MCHC 32.7 31.5 - 36.0 g/dL   RBC 3.54 (L) 3.70 - 5.45 10e6/uL   RDW 26.5 (H) 11.2 - 14.5 %   lymph# 1.0 0.9 - 3.3 10e3/uL   MONO# 0.9 0.1 - 0.9 10e3/uL   Eosinophils Absolute 0.3 0.0 - 0.5 10e3/uL   Basophils Absolute 0.0 0.0 - 0.1 10e3/uL   NEUT% 58.1 38.4 - 76.8 %   LYMPH% 18.4 14.0 - 49.7 %   MONO% 17.0 (H) 0.0 - 14.0 %   EOS% 5.8 0.0 - 7.0 %   BASO% 0.7 0.0 - 2.0 %   Comprehensive metabolic panel  Result Value Ref Range   Sodium 139 136 - 145 mEq/L   Potassium 2.9 (LL) 3.5 - 5.1 mEq/L   Chloride 102 98 - 109 mEq/L   CO2 29 22 - 29 mEq/L   Glucose 118 70 - 140 mg/dl   BUN 8.7 7.0 - 26.0 mg/dL   Creatinine 0.8 0.6 - 1.1 mg/dL   Total Bilirubin 0.41 0.20 - 1.20 mg/dL   Alkaline Phosphatase 129 40 - 150 U/L   AST 10 5 - 34 U/L   ALT 14 0 - 55 U/L   Total Protein 6.7 6.4 - 8.3 g/dL   Albumin 2.9 (L) 3.5 - 5.0 g/dL   Calcium 9.4 8.4 - 10.4 mg/dL   Anion Gap 8 3 - 11 mEq/L   EGFR 88 (L) >90 ml/min/1.73 m2   CA 125 on 03-14-15   427, this having been 643 on 02-14-15  Studies/Results:  No results found.  Medications: I have reviewed the patient's current medications.  Increase miralax to bid.  Continue daily protonix. Continue aloxi Continue Eliquis.  Continue catapress, HCTZ, labetolol as prescribed  Add KCl 10 mEq, 2 today then 1 daily  Continue Decadron x 3 days around each taxotere.  DISCUSSION Interval history reviewed, overall seems gradually better since start of chemo. She is very willing to continue treatment, will see MD with lab 4-3 prior to cycle 3 carbo taxotere on 04-07-15. Carbo test dose prior to each treatment, neulasta with taxotere.  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 Present metastatic disease involves large necrotic right inguinal node, paraaortic/ aortocaval/ external iliac nodes, and multiple bilateral pulmonary nodes. CA 125 improving with chemo thus far. Genetics referral .  Prn MSIR , could do RT to left psoas area if needed; have not added zometa yet 2.LLE DVT documented 02-12-15: on Eliquis, leg no longer painful,  swelling improved. No bleeding. Does not have IVC filter. Eliquis approved thru [employer] assistance x 1 year  3.severe iron deficiency anemia: etiology not clear. Does not seem to have had heme check stools in hospital (patient tells me this was done in ED, tho I have not located that information in EMR) and UA was negative for blood. Feraheme x 2 doses done. Hemoglobin a little lower at 8.5, would need PRBCs if <8 or more symptoms. 4.difficult IV access but managing. I do not think she should be off anticoagulation x 48 hours at this point, as is IR protocol for PAC and Eliquis, due to extent of the recent LLE DVT. Will  need to manage with peripheral IVs to start + PAC later, or possibly PICC. 5. chemo nausea and vomiting: Used aloxi with cycle 2 and now on protonix, symptoms improved. Continue aloxi and protonix 6.uncontrolled HTN initially: required several doses clonidine at time of first chemo, now on labetalol 200 mg tid per urgent care + clonidine 0.1 mg tid and HCTZ. BP much better now, 150/66 today. Needs PCP, or if more problems prior will try to get her to cardiology 7.recent weight loss, po intake difficult with nausea. Will ask [place] nutritionist to see. 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 Stable 11.Social: Has medicaid application, will speak with [facility] financial staff about her questions with this 12.overdue mammograms, general medical care, colonoscopy. Noncompliance with follow up after cancer diagnoses and adjuvant treatment after ~ 2012. Noncompliant with care for HTN over past several years, which I believe is despite family efforts (other daughter is pediatrician) Needs PCP, see above.   All questions answered and they know to call prior to next scheduled visit if needed. Time spent 25 min including >50% counseling and coordination of care.   Jontae Adebayo P,  MD   03/24/2015, 8:40 AM

## 2015-03-24 NOTE — Telephone Encounter (Signed)
-----   Message from Gordy Levan, MD sent at 03/24/2015  9:03 AM EDT ----- KCl 10 mEq  2 today then 1 daily #30

## 2015-03-24 NOTE — Telephone Encounter (Signed)
lvm the Kcl was e-scribed.

## 2015-03-28 ENCOUNTER — Other Ambulatory Visit: Payer: Self-pay

## 2015-03-28 ENCOUNTER — Ambulatory Visit: Payer: Self-pay | Admitting: Oncology

## 2015-03-30 ENCOUNTER — Telehealth: Payer: Self-pay | Admitting: Oncology

## 2015-03-30 NOTE — Telephone Encounter (Signed)
LT MESS REGARDING GENETIC COUNSELING REFERRAL °

## 2015-03-31 ENCOUNTER — Telehealth: Payer: Self-pay

## 2015-03-31 MED ORDER — LABETALOL HCL 100 MG PO TABS: 200.0000 mg | ORAL_TABLET | Freq: Three times a day (TID) | ORAL | Status: DC

## 2015-03-31 NOTE — Telephone Encounter (Signed)
Pt called stating she has a letter saying she can get eliquis free for 1 year. She is asking how to do this. She states the letter mentions the eliquis will be mailed to the MDs office. As this is not a frequent way of handling medication financial assistance, I requested pt bring in the letter so we could see it. The pt states she will call the company and ask for explanation. I said the company could contact [place] if there is anything they need.   Pt also asked for labetolol refill. Pt does not have a PCP, we are attempting to help her with this. Dr Edwyna Shell agreed to refill labetolol.

## 2015-04-03 ENCOUNTER — Other Ambulatory Visit: Payer: Self-pay | Admitting: Oncology

## 2015-04-03 DIAGNOSIS — C569 Malignant neoplasm of unspecified ovary: Secondary | ICD-10-CM

## 2015-04-03 DIAGNOSIS — C541 Malignant neoplasm of endometrium: Secondary | ICD-10-CM

## 2015-04-04 ENCOUNTER — Telehealth: Payer: Self-pay

## 2015-04-04 ENCOUNTER — Other Ambulatory Visit: Payer: Self-pay | Admitting: Oncology

## 2015-04-04 ENCOUNTER — Other Ambulatory Visit (HOSPITAL_BASED_OUTPATIENT_CLINIC_OR_DEPARTMENT_OTHER): Payer: Self-pay

## 2015-04-04 ENCOUNTER — Ambulatory Visit (HOSPITAL_BASED_OUTPATIENT_CLINIC_OR_DEPARTMENT_OTHER): Payer: Self-pay | Admitting: Oncology

## 2015-04-04 ENCOUNTER — Other Ambulatory Visit: Payer: Self-pay

## 2015-04-04 VITALS — BP 143/60 | HR 74 | Temp 97.9°F | Resp 18 | Ht 62.0 in | Wt 172.0 lb

## 2015-04-04 DIAGNOSIS — C801 Malignant (primary) neoplasm, unspecified: Secondary | ICD-10-CM

## 2015-04-04 DIAGNOSIS — Z7901 Long term (current) use of anticoagulants: Secondary | ICD-10-CM

## 2015-04-04 DIAGNOSIS — C569 Malignant neoplasm of unspecified ovary: Secondary | ICD-10-CM

## 2015-04-04 DIAGNOSIS — I82402 Acute embolism and thrombosis of unspecified deep veins of left lower extremity: Secondary | ICD-10-CM

## 2015-04-04 DIAGNOSIS — Z8543 Personal history of malignant neoplasm of ovary: Secondary | ICD-10-CM

## 2015-04-04 DIAGNOSIS — C7801 Secondary malignant neoplasm of right lung: Secondary | ICD-10-CM

## 2015-04-04 DIAGNOSIS — G620 Drug-induced polyneuropathy: Secondary | ICD-10-CM

## 2015-04-04 DIAGNOSIS — D509 Iron deficiency anemia, unspecified: Secondary | ICD-10-CM

## 2015-04-04 DIAGNOSIS — R112 Nausea with vomiting, unspecified: Secondary | ICD-10-CM

## 2015-04-04 DIAGNOSIS — C541 Malignant neoplasm of endometrium: Secondary | ICD-10-CM

## 2015-04-04 DIAGNOSIS — Z8542 Personal history of malignant neoplasm of other parts of uterus: Secondary | ICD-10-CM

## 2015-04-04 DIAGNOSIS — D500 Iron deficiency anemia secondary to blood loss (chronic): Secondary | ICD-10-CM

## 2015-04-04 DIAGNOSIS — T451X5A Adverse effect of antineoplastic and immunosuppressive drugs, initial encounter: Secondary | ICD-10-CM

## 2015-04-04 DIAGNOSIS — C778 Secondary and unspecified malignant neoplasm of lymph nodes of multiple regions: Secondary | ICD-10-CM

## 2015-04-04 DIAGNOSIS — I10 Essential (primary) hypertension: Secondary | ICD-10-CM

## 2015-04-04 DIAGNOSIS — C7802 Secondary malignant neoplasm of left lung: Secondary | ICD-10-CM

## 2015-04-04 LAB — CBC WITH DIFFERENTIAL/PLATELET
BASO%: 0.5 % (ref 0.0–2.0)
BASOS ABS: 0 10*3/uL (ref 0.0–0.1)
EOS ABS: 0 10*3/uL (ref 0.0–0.5)
EOS%: 0.6 % (ref 0.0–7.0)
HCT: 25.7 % — ABNORMAL LOW (ref 34.8–46.6)
HEMOGLOBIN: 8.3 g/dL — AB (ref 11.6–15.9)
LYMPH%: 11.6 % — AB (ref 14.0–49.7)
MCH: 24.7 pg — AB (ref 25.1–34.0)
MCHC: 32.2 g/dL (ref 31.5–36.0)
MCV: 76.9 fL — AB (ref 79.5–101.0)
MONO#: 0.9 10*3/uL (ref 0.1–0.9)
MONO%: 11.2 % (ref 0.0–14.0)
NEUT%: 76.1 % (ref 38.4–76.8)
NEUTROS ABS: 6 10*3/uL (ref 1.5–6.5)
PLATELETS: 181 10*3/uL (ref 145–400)
RBC: 3.34 10*6/uL — ABNORMAL LOW (ref 3.70–5.45)
RDW: 29 % — AB (ref 11.2–14.5)
WBC: 7.9 10*3/uL (ref 3.9–10.3)
lymph#: 0.9 10*3/uL (ref 0.9–3.3)

## 2015-04-04 LAB — COMPREHENSIVE METABOLIC PANEL
ALBUMIN: 2.8 g/dL — AB (ref 3.5–5.0)
ALK PHOS: 124 U/L (ref 40–150)
ALT: <9 U/L (ref 0–55)
ANION GAP: 8 meq/L (ref 3–11)
AST: 8 U/L (ref 5–34)
BILIRUBIN TOTAL: 0.4 mg/dL (ref 0.20–1.20)
BUN: 10 mg/dL (ref 7.0–26.0)
CO2: 28 mEq/L (ref 22–29)
Calcium: 9.6 mg/dL (ref 8.4–10.4)
Chloride: 105 mEq/L (ref 98–109)
Creatinine: 1.1 mg/dL (ref 0.6–1.1)
EGFR: 62 mL/min/{1.73_m2} — AB (ref >90)
Glucose: 108 mg/dl (ref 70–140)
POTASSIUM: 3.4 meq/L — AB (ref 3.5–5.1)
SODIUM: 141 meq/L (ref 136–145)
TOTAL PROTEIN: 7 g/dL (ref 6.4–8.3)

## 2015-04-04 MED ORDER — LORAZEPAM 0.5 MG PO TABS: ORAL_TABLET | ORAL | Status: DC

## 2015-04-04 MED ORDER — MORPHINE SULFATE 15 MG PO TABS: 15.0000 mg | ORAL_TABLET | ORAL | Status: DC | PRN

## 2015-04-04 NOTE — Telephone Encounter (Signed)
S/w pt that we are planning for the US biopsy to be done on Wed. With this as target date, she needs to hold eliquis tonight, 4/4 and 4/5 AM before the procedure. Then she can resume after the procedure. She needs to be up walking frequently for these days she is holding the eliquis.  If the US biopsy is not scheduled for 4/5 she needs to call us to change the hold eliquis schedule. She agreed with this.

## 2015-04-04 NOTE — Progress Notes (Signed)
OFFICE PROGRESS NOTE   April 04, 2015   Physicians:E.Rossi. New patient referral made to [facility] for PCP  INTERVAL HISTORY:  Patient is seen, together with 2 family members, in continuing attention to chemotherapy in process for metastatic gyn carcinoma involving lungs and extensive adenoopathy including right iliac node. She had cycle 2 carbo taxotere on 3-16 with neulasta 03-19-15.  She is on Eliquis for LLE DVT 02-2015. Last aspiration right nodal mass was for 60 cc on 02-15-15.   Patient has made some improvement since beginning chemotherapy and feels that she is tolerating treatment adequately. The right inguinal nodal mass has again increased in size over past week or so, and is somewhat discolored, tho not painful; she denies trauma there (note on Eliquis). Nausea has improved, no longer daily, and she continues protonix. She is still fatigued and very inactive at home, but more interested in sedentary activities than previously. She notices back pain only when she bends over, last pain medication was x1 yesterday. She reports itching with oral potassium, last dose yesterday. She denies increased SOB or cough, no chest pain, no bleeding, no increased swelling either LE, no fever. Bowels are moving. She is trying to drink fluids, not excessively thirsty. No increase in baseline peripheral neuropathy.  Remainder of 10 point Review of Systems unchanged.   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 Last follow up  visit for the gyn cancers appears to have been with Dr Kinard in 11-2010, a lost to follow up since then until presented with recurrent disease 02-2015.   Patient reports right inguinal mass for ~ 2 months, then LLE pain for ~ 3.5 weeks prior to presenting to ED on 02-12-15. She had recently made some changes in diet (a "Daniel Fast" thru her church with only fruits and vegetables), but also appetite not great so that she lost "2 dress sizes" in past 1-2 months. Only pain was in right groin, this improved since necrotic nodal mass was aspirated for 60 cc dark fluid on 02-15-15. She denies fever or other symptoms of infection and had no bleeding PTA. She denies SOB, cough, chest pain, wheezing. She denies HA, other neurologic changes other than drowsiness with medication in hospital. Bowels have been moving, denies melanotic stools. Bladder asymptomatic, apparently no hematuria. Some nausea. Had been less active over last couple of weeks with LE discomfort. Peripheral IV access has not been easy in hospital. In ED on 02-12-15 CT AP showed nodules in lung bases suspicious for metastatic disease, liver not remarkable, no hydronephrosis, 6 mm right renal calculus, thrombus left external iliac vein into left common femoral vein, retroperitoneal and pelvic adenopathy, an 8.5 x 7.1 cm necrotic right inguinal node, post hyst ooph, no ascites, likely tumor involvement in left psoas with adjacent involvement of L4 and L5 vertebral bodies. Patient was admitted, begun on heparin qtt which was transitioned to Eliquis on 02-14-15. She had aspiration and core needle biopsy of right inguinal nodal mass on 02-15-15. CA 125 from 02-14-15 was 642.9, this having been 3.6 in 08-2010. She was seen   in consultation by Dr Rossi on 02-14-15. Pathology finalized today shows metastatic adenocarcinoma with immunostains consistent with gyn primary ([id number]). She was transfused 2 units PRBCs for hgb 6.7. Iron studies 2-12 with serum iron 14 and %sat  7. First carbo taxotere given 02-24-15, and feraheme x 2 on 2-15 and 03-03-15.    Objective:  Vital signs in last 24 hours:  BP 143/60 mmHg  Pulse 74  Temp(Src) 97.9 F (36.6 C) (Oral)  Resp 18  Ht 5' 2" (1.575 m)  Wt 172 lb (78.019 kg)  BMI 31.45 kg/m2  SpO2 100% Weight down 1 lb. Looks comfortable, respirations not labored. Alert, oriented and appropriate, very pleasant.  Ambulatory without assistance.  Complete alopecia  HEENT:PERRL, sclerae not icteric, no excessive lacrimation. Oral mucosa moist without lesions, posterior pharynx clear.  Neck supple. No JVD.  Lymphatics:no cervical,supraclavicular adenopathy. Right inguinal mass now tightly distended ~ 6 cm diameter, not hot, erythematous or tender. Resp: clear to auscultation bilaterally  Cardio: regular rate and rhythm. No gallop. GI: soft, nontender, not distended, no mass or organomegaly. Normally active bowel sounds.  Musculoskeletal/ Extremities: LE without pitting edema, cords, tenderness. Neuro: no increase in baseline peripheral neuropathy. Otherwise nonfocal.PSYCH appropriate mood and affect Skin without rash, ecchymosis, petechiae. No nail changes noted.   Lab Results:  Results for orders placed or performed in visit on 04/04/15  CBC with Differential  Result Value Ref Range   WBC 7.9 3.9 - 10.3 10e3/uL   NEUT# 6.0 1.5 - 6.5 10e3/uL   HGB 8.3 (L) 11.6 - 15.9 g/dL   HCT 25.7 (L) 34.8 - 46.6 %   Platelets 181 145 - 400 10e3/uL   MCV 76.9 (L) 79.5 - 101.0 fL   MCH 24.7 (L) 25.1 - 34.0 pg   MCHC 32.2 31.5 - 36.0 g/dL   RBC 3.34 (L) 3.70 - 5.45 10e6/uL   RDW 29.0 (H) 11.2 - 14.5 %   lymph# 0.9 0.9 - 3.3 10e3/uL   MONO# 0.9 0.1 - 0.9 10e3/uL   Eosinophils Absolute 0.0 0.0 - 0.5 10e3/uL   Basophils Absolute 0.0 0.0 - 0.1 10e3/uL   NEUT% 76.1 38.4 - 76.8 %   LYMPH% 11.6 (L) 14.0 - 49.7 %   MONO% 11.2 0.0 - 14.0 %   EOS% 0.6 0.0 - 7.0 %   BASO% 0.5 0.0 - 2.0 %  Comprehensive metabolic panel  Result Value  Ref Range   Sodium 141 136 - 145 mEq/L   Potassium 3.4 (L) 3.5 - 5.1 mEq/L   Chloride 105 98 - 109 mEq/L   CO2 28 22 - 29 mEq/L   Glucose 108 70 - 140 mg/dl   BUN 10.0 7.0 - 26.0 mg/dL   Creatinine 1.1 0.6 - 1.1 mg/dL   Total Bilirubin 0.40 0.20 - 1.20 mg/dL   Alkaline Phosphatase 124 40 - 150 U/L   AST 8 5 - 34 U/L   ALT <9 0 - 55 U/L   Total Protein 7.0 6.4 - 8.3 g/dL   Albumin 2.8 (L) 3.5 - 5.0 g/dL   Calcium 9.6 8.4 - 10.4 mg/dL   Anion Gap 8 3 - 11 mEq/L   EGFR 62 (L) >90 ml/min/1.73 m2   CA 125 down to 427 on 03-14-15, from 643 on 02-14-15.  Studies/Results:  No results found.  Medications: I have reviewed the patient's current medications. Not on oral iron since 2 doses feraheme Hold K due to itching and push high potassium foods Continue Eliquis and antihypertensives     DISCUSSION Likely fluid reaccumulation or possibly bleeding into the necrotic nodal mass in right groin. Situation discussed with gyn oncology, recommend IR do aspiration.  I have spoken with IR PA, who conferred with IR physician, initially requesting that Eliquis be held x 48 hrs prior to aspiration. Following visit, communication directly with IR MD, with clarification that area is superficial and only aspiration needed, so that Eliquis does not need to be held.  Otherwise clinically she seems to be improving gradually and is in agreement with continuing treatment.     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 Present metastatic disease involves large necrotic right inguinal node, paraaortic/ aortocaval/ external iliac nodes, and multiple bilateral pulmonary nodes. CA 125 some improvement with chemo thus far. OK to treat 04-07-15 if ANC >=1.5 and  plt >=100k/  Genetics counseling needed, not yet accomplished.  Prn MSIR needed only x1 in last 36 hrs , could do RT to left psoas area if needed; have not added zometa yet 2.LLE DVT documented 02-12-15: on Eliquis, leg no longer painful, swelling improved. No bleeding. Does not have IVC filter. Eliquis approved thru [employer] assistance x 1 year 3.Increase in right inguinal mass: may be reaccumulation of fluid, or possibly has bled into the area with the Eliquis. US and IR aspiration if possible - thank you. 4.severe iron deficiency anemia: etiology not clear. Does not seem to have had heme check stools in hospital (patient tells me this was done in ED, tho I have not located that information in EMR) and UA was negative for blood. Feraheme x 2 doses done. Hemoglobin a little lower at 8.3, would need PRBCs if <8 or more symptoms. 5. chemo nausea and vomiting: Used aloxi with cycle 2 and now on protonix, symptoms improved. Continue aloxi and protonix 6.uncontrolled HTN initially: now on labetalol 200 mg tid per urgent care + clonidine 0.1 mg tid and HCTZ. BP much better now. Needs PCP, or if more problems prior will try to get her to cardiology 7.recent weight loss, po intake may be improving, nausea better. [facility] nutritionist involved. 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 Stable 11.Social: Has medicaid application 12.difficult IV access but managing. Would need to be off Eliquis x 48 hrs for PAC. 13.overdue mammograms, general medical care, colonoscopy. Noncompliance with follow up after cancer diagnoses and adjuvant treatment after ~ 2012. Noncompliant with care for HTN over past several years, which I believe is despite family efforts ( daughter is pediatrician) Needs PCP, see above.   All questions answered and they know to call if needed prior to next appointment. Chemo and  neulasta orders confirmed. Time spent 30 min including >50% counseling and coordination of care.   LIVESAY,LENNIS P, MD   04/04/2015, 9:05 PM    

## 2015-04-05 ENCOUNTER — Telehealth: Payer: Self-pay

## 2015-04-05 ENCOUNTER — Other Ambulatory Visit: Payer: Self-pay

## 2015-04-05 ENCOUNTER — Other Ambulatory Visit: Payer: Self-pay | Admitting: Oncology

## 2015-04-05 ENCOUNTER — Encounter: Payer: Self-pay | Admitting: Oncology

## 2015-04-05 DIAGNOSIS — C541 Malignant neoplasm of endometrium: Secondary | ICD-10-CM

## 2015-04-05 DIAGNOSIS — C569 Malignant neoplasm of unspecified ovary: Secondary | ICD-10-CM

## 2015-04-05 NOTE — Telephone Encounter (Signed)
Called scheduling about pt's US aspiration and the person said it was not in que and the order needs to be replaced. Placed a 2nd order for US aspiration. Called scheduling and they said it was in que once and the pt had been called. Will continue to monitor for appt to show in epic.

## 2015-04-05 NOTE — Telephone Encounter (Signed)
-----   Message from Gordy Levan, MD sent at 04/04/2015  9:08 PM EDT ----- Change of plan:  She does NOT need to hold Eliquis for IR aspiration of the inguinal mass.  Please let her know that I discussed with radiologist this PM and she can take Eliquis on usual schedule, does not need to hold.  thanks

## 2015-04-05 NOTE — Telephone Encounter (Signed)
S/w pt that she does not need to hold eliquis and to restart taking it this AM. She knows to expect call from scheduler for aspiration.

## 2015-04-07 ENCOUNTER — Other Ambulatory Visit: Payer: Self-pay | Admitting: Oncology

## 2015-04-07 ENCOUNTER — Ambulatory Visit (HOSPITAL_BASED_OUTPATIENT_CLINIC_OR_DEPARTMENT_OTHER): Payer: Self-pay

## 2015-04-07 ENCOUNTER — Other Ambulatory Visit (HOSPITAL_BASED_OUTPATIENT_CLINIC_OR_DEPARTMENT_OTHER): Payer: Self-pay

## 2015-04-07 ENCOUNTER — Encounter: Payer: Self-pay | Admitting: Oncology

## 2015-04-07 VITALS — BP 158/80 | HR 76 | Temp 98.2°F

## 2015-04-07 DIAGNOSIS — C778 Secondary and unspecified malignant neoplasm of lymph nodes of multiple regions: Secondary | ICD-10-CM

## 2015-04-07 DIAGNOSIS — C801 Malignant (primary) neoplasm, unspecified: Secondary | ICD-10-CM

## 2015-04-07 DIAGNOSIS — C541 Malignant neoplasm of endometrium: Secondary | ICD-10-CM

## 2015-04-07 DIAGNOSIS — C569 Malignant neoplasm of unspecified ovary: Secondary | ICD-10-CM

## 2015-04-07 DIAGNOSIS — Z5111 Encounter for antineoplastic chemotherapy: Secondary | ICD-10-CM

## 2015-04-07 LAB — CBC WITH DIFFERENTIAL/PLATELET
BASO%: 0.1 % (ref 0.0–2.0)
BASOS ABS: 0 10*3/uL (ref 0.0–0.1)
EOS%: 0 % (ref 0.0–7.0)
Eosinophils Absolute: 0 10*3/uL (ref 0.0–0.5)
HCT: 26 % — ABNORMAL LOW (ref 34.8–46.6)
HGB: 8.5 g/dL — ABNORMAL LOW (ref 11.6–15.9)
LYMPH%: 7.5 % — AB (ref 14.0–49.7)
MCH: 25.4 pg (ref 25.1–34.0)
MCHC: 32.7 g/dL (ref 31.5–36.0)
MCV: 77.6 fL — AB (ref 79.5–101.0)
MONO#: 0.2 10*3/uL (ref 0.1–0.9)
MONO%: 1.3 % (ref 0.0–14.0)
NEUT#: 12.1 10*3/uL — ABNORMAL HIGH (ref 1.5–6.5)
NEUT%: 91.1 % — AB (ref 38.4–76.8)
Platelets: 301 10*3/uL (ref 145–400)
RBC: 3.35 10*6/uL — ABNORMAL LOW (ref 3.70–5.45)
RDW: 26 % — ABNORMAL HIGH (ref 11.2–14.5)
WBC: 13.3 10*3/uL — ABNORMAL HIGH (ref 3.9–10.3)
lymph#: 1 10*3/uL (ref 0.9–3.3)
nRBC: 0 % (ref 0–0)

## 2015-04-07 MED ORDER — LORAZEPAM 1 MG PO TABS
0.5000 mg | ORAL_TABLET | Freq: Once | ORAL | Status: AC | PRN
  Administered 2015-04-07: 0.5 mg via ORAL

## 2015-04-07 MED ORDER — SODIUM CHLORIDE 0.9 % IV SOLN
60.0000 mg/m2 | Freq: Once | INTRAVENOUS | Status: AC
  Administered 2015-04-07: 110 mg via INTRAVENOUS
  Filled 2015-04-07: qty 11

## 2015-04-07 MED ORDER — CARBOPLATIN CHEMO INTRADERMAL TEST DOSE 100MCG/0.02ML
100.0000 ug | Freq: Once | INTRADERMAL | Status: AC
  Administered 2015-04-07: 100 ug via INTRADERMAL
  Filled 2015-04-07: qty 0.02

## 2015-04-07 MED ORDER — LORAZEPAM 1 MG PO TABS
ORAL_TABLET | ORAL | Status: AC
  Filled 2015-04-07: qty 1

## 2015-04-07 MED ORDER — PALONOSETRON HCL INJECTION 0.25 MG/5ML
0.2500 mg | Freq: Once | INTRAVENOUS | Status: AC
  Administered 2015-04-07: 0.25 mg via INTRAVENOUS

## 2015-04-07 MED ORDER — SODIUM CHLORIDE 0.9 % IV SOLN
10.0000 mg | Freq: Once | INTRAVENOUS | Status: AC
  Administered 2015-04-07: 10 mg via INTRAVENOUS
  Filled 2015-04-07: qty 1

## 2015-04-07 MED ORDER — PALONOSETRON HCL INJECTION 0.25 MG/5ML
INTRAVENOUS | Status: AC
  Filled 2015-04-07: qty 5

## 2015-04-07 MED ORDER — SODIUM CHLORIDE 0.9 % IV SOLN
Freq: Once | INTRAVENOUS | Status: AC
  Administered 2015-04-07: 11:00:00 via INTRAVENOUS

## 2015-04-07 NOTE — Patient Instructions (Signed)
[facility] Discharge Instructions for Patients Receiving Chemotherapy  Today you received the following chemotherapy agents Taxotere  To help prevent nausea and vomiting after your treatment, we encourage you to take your nausea medication as needed   If you develop nausea and vomiting that is not controlled by your nausea medication, call the clinic.   BELOW ARE SYMPTOMS THAT SHOULD BE REPORTED IMMEDIATELY:  *FEVER GREATER THAN 100.5 F  *CHILLS WITH OR WITHOUT FEVER  NAUSEA AND VOMITING THAT IS NOT CONTROLLED WITH YOUR NAUSEA MEDICATION  *UNUSUAL SHORTNESS OF BREATH  *UNUSUAL BRUISING OR BLEEDING  TENDERNESS IN MOUTH AND THROAT WITH OR WITHOUT PRESENCE OF ULCERS  *URINARY PROBLEMS  *BOWEL PROBLEMS  UNUSUAL RASH Items with * indicate a potential emergency and should be followed up as soon as possible.  Feel free to call the clinic you have any questions or concerns. The clinic phone number is [phone].  Please show the CHEMO ALERT CARD at check-in to the Emergency Department and triage nurse.   

## 2015-04-07 NOTE — Progress Notes (Signed)
Dr Marko Plume aware of K+ level. Pt states she can not tolerate oral potassium due to itching.  Trying to increase potassium in diet.

## 2015-04-07 NOTE — Progress Notes (Signed)
Pt refused AVS.

## 2015-04-07 NOTE — Progress Notes (Signed)
Carbo skin test positive, red, raised, burning and swollen, verified with pharmacy.  Dr Marko Plume notified.  Proceed with Taxotere, hold Carbo per Dr Marko Plume.  Pt verbalized understanding.

## 2015-04-07 NOTE — Progress Notes (Signed)
Medical Oncology  Notified by infusion RN of reaction to [place] skin test today. Will hold [place], give taxol as planned.  Godfrey Pick, MD

## 2015-04-09 ENCOUNTER — Ambulatory Visit (HOSPITAL_BASED_OUTPATIENT_CLINIC_OR_DEPARTMENT_OTHER): Payer: Self-pay

## 2015-04-09 DIAGNOSIS — C541 Malignant neoplasm of endometrium: Secondary | ICD-10-CM

## 2015-04-09 MED ORDER — PEGFILGRASTIM INJECTION 6 MG/0.6ML ~~LOC~~
6.0000 mg | PREFILLED_SYRINGE | Freq: Once | SUBCUTANEOUS | Status: AC
  Administered 2015-04-09: 6 mg via SUBCUTANEOUS

## 2015-04-11 ENCOUNTER — Other Ambulatory Visit: Payer: Self-pay | Admitting: Radiology

## 2015-04-12 ENCOUNTER — Encounter (HOSPITAL_COMMUNITY): Payer: Self-pay

## 2015-04-12 ENCOUNTER — Ambulatory Visit (HOSPITAL_COMMUNITY)
Admission: RE | Admit: 2015-04-12 | Discharge: 2015-04-12 | Disposition: A | Discharge status: Home / Self Care | Payer: Medicaid Other | Source: Ambulatory Visit | Attending: Oncology | Admitting: Oncology

## 2015-04-12 DIAGNOSIS — Z90710 Acquired absence of both cervix and uterus: Secondary | ICD-10-CM | POA: Insufficient documentation

## 2015-04-12 DIAGNOSIS — R590 Localized enlarged lymph nodes: Secondary | ICD-10-CM | POA: Insufficient documentation

## 2015-04-12 DIAGNOSIS — Z923 Personal history of irradiation: Secondary | ICD-10-CM | POA: Insufficient documentation

## 2015-04-12 DIAGNOSIS — Z8542 Personal history of malignant neoplasm of other parts of uterus: Secondary | ICD-10-CM | POA: Diagnosis not present

## 2015-04-12 DIAGNOSIS — Z808 Family history of malignant neoplasm of other organs or systems: Secondary | ICD-10-CM | POA: Diagnosis not present

## 2015-04-12 DIAGNOSIS — Z8543 Personal history of malignant neoplasm of ovary: Secondary | ICD-10-CM | POA: Diagnosis not present

## 2015-04-12 DIAGNOSIS — C569 Malignant neoplasm of unspecified ovary: Secondary | ICD-10-CM

## 2015-04-12 DIAGNOSIS — M7989 Other specified soft tissue disorders: Secondary | ICD-10-CM | POA: Insufficient documentation

## 2015-04-12 DIAGNOSIS — I10 Essential (primary) hypertension: Secondary | ICD-10-CM | POA: Diagnosis not present

## 2015-04-12 MED ORDER — CEFAZOLIN SODIUM-DEXTROSE 2-4 GM/100ML-% IV SOLN
2.0000 g | Freq: Once | INTRAVENOUS | Status: AC
  Administered 2015-04-12: 2 g via INTRAVENOUS
  Filled 2015-04-12 (×2): qty 100

## 2015-04-12 MED ORDER — SODIUM CHLORIDE 0.9 % IV SOLN
Freq: Once | INTRAVENOUS | Status: AC
  Administered 2015-04-12: 12:00:00 via INTRAVENOUS

## 2015-04-12 MED ORDER — MIDAZOLAM HCL 2 MG/2ML IJ SOLN
INTRAMUSCULAR | Status: AC | PRN
  Administered 2015-04-12: 1 mg via INTRAVENOUS

## 2015-04-12 MED ORDER — FENTANYL CITRATE (PF) 100 MCG/2ML IJ SOLN
INTRAMUSCULAR | Status: AC
  Filled 2015-04-12: qty 2

## 2015-04-12 MED ORDER — MIDAZOLAM HCL 2 MG/2ML IJ SOLN
INTRAMUSCULAR | Status: AC
  Filled 2015-04-12: qty 4

## 2015-04-12 MED ORDER — FENTANYL CITRATE (PF) 100 MCG/2ML IJ SOLN
INTRAMUSCULAR | Status: AC | PRN
  Administered 2015-04-12: 50 ug via INTRAVENOUS

## 2015-04-12 NOTE — Sedation Documentation (Signed)
Patient denies pain and is resting comfortably.  

## 2015-04-12 NOTE — Procedures (Signed)
Technically successful US guided aspiration of approximately 180 cc of brown fluid from cystic right inguinal LN.   EBL: Minimal   No immediate complications.   Ronny Bacon, MD Pager #: [phone]

## 2015-04-12 NOTE — H&P (Signed)
Chief Complaint: Recurrent fluid collection of right inguinal lymph node  Referring Physician(s): Dr. Evlyn Clines  History of Present Illness: [name] is a 60 y.o. female with a history of Uterine Cancer, underwent Bx and aspiration of necrotic (R)inguinal LN a few months ago. The cystic/fluid component has recurred and is causing some discomfort. IR is asked to repeat aspiration of the fluid. Previous pathology was c/w metastatic carcinoma PMHx, meds, labs, imaging reviewed. Pt has continued Eliquis as directed. Has been NPO for procedure and elects to have sedation for this today Family at bedside.  Past Medical History  Diagnosis Date  . History of radiation therapy 10/21/08-11/23/08 & 12/07/10/13/12/23 2010    ENDOMETRIOID   . Uterine cancer (HCC)     ENDOMETRIAL CA& OVARY CANCER  . Hypertension     Past Surgical History  Procedure Laterality Date  . Tubal ligation    . Abdominal hysterectomy      Allergies: Review of patient's allergies indicates no active allergies.  Medications: Prior to Admission medications   Medication Sig Start Date End Date Taking? Authorizing Provider  cloNIDine (CATAPRES) 0.1 MG tablet Take 0.1 mg by mouth at bedtime.     Yes Historical Provider, MD  hydrochlorothiazide (HYDRODIURIL) 25 MG tablet Take 25 mg by mouth daily.   11/01/08  Yes Historical Provider, MD  naproxen sodium (ANAPROX) 220 MG tablet Take 440 mg by mouth daily as needed (pain).    Yes Historical Provider, MD     Family History  Problem Relation Age of Onset  . Bone cancer Paternal Grandmother     OSSEOUS METASTASIS    Social History   Social History  . Marital Status: Married    Spouse Name: N/A  . Number of Children: N/A  . Years of Education: N/A   Social History Main Topics  . Smoking status: Never Smoker   . Smokeless tobacco: Never Used  . Alcohol Use: No  . Drug Use: No  . Sexual Activity: Not Asked   Other Topics Concern  . None     Social History Narrative    Review of Systems: A 12 point ROS discussed and pertinent positives are indicated in the HPI above.  All other systems are negative.  Review of Systems  Constitutional: Negative for fever, chills, activity change, appetite change and fatigue.  HENT: Negative.   Respiratory: Negative for chest tightness, shortness of breath and wheezing.   Cardiovascular: Positive for leg swelling. Negative for chest pain.  Gastrointestinal: Negative for nausea, vomiting, abdominal pain and abdominal distention.  Musculoskeletal:       "Knot in right groin"  Skin: Negative.     Vital Signs: BP 152/83 mmHg  Pulse 78  Temp(Src) 98.8 F (37.1 C) (Oral)  Resp 14  Ht 5\' 2"  (1.575 m)  Wt 172 lb (78.019 kg)  BMI 31.45 kg/m2  SpO2 100%  Physical Exam  Constitutional: She is oriented to person, place, and time. She appears well-developed and well-nourished.  HENT:  Head: Normocephalic and atraumatic.  Eyes: EOM are normal.  Neck: Normal range of motion. Neck supple.  Cardiovascular: Normal rate, regular rhythm and normal heart sounds.   Pulmonary/Chest: Effort normal and breath sounds normal.  Abdominal: Soft. Bowel sounds are normal. She exhibits no distension. There is no tenderness.  Musculoskeletal: Normal range of motion.  Large palpable lymph node right groin  Neurological: She is alert and oriented to person, place, and time.  Skin: Skin is warm and dry.  Psychiatric: She has a normal mood and affect. Her behavior is normal. Judgment and thought content normal.  Vitals reviewed.   Mallampati Score:  MD Evaluation Airway: WNL Heart: WNL Abdomen: WNL Chest/ Lungs: WNL ASA  Classification: 2 Mallampati/Airway Score: Two  Imaging: No results found.  Labs:  CBC:  Recent Labs  03/17/15 1056 03/24/15 0756 04/04/15 1316 04/07/15 0922  WBC 25.5* 5.2 7.9 13.3*  HGB 9.0* 8.5* 8.3* 8.5*  HCT 28.3* 26.1* 25.7* 26.0*  PLT 362 350 181 301     COAGS:  Recent Labs  02/12/15 2305 02/15/15 2131 02/16/15 0455 02/16/15 1244  INR 1.19  --   --   --   APTT 31 38* 60* 58*    BMP:  Recent Labs  02/15/15 0519 02/16/15 0455 02/16/15 1455 02/17/15 0448  03/14/15 0930 03/17/15 1056 03/24/15 0756 04/04/15 1317  NA 138 136 139 140  < > 137 138 139 141  K 4.1 4.3 4.0 4.4  < > 3.7 3.8 2.9* 3.4*  CL 101 100* 102 100*  --   --   --   --   --   CO2 28 26 28 28   < > 22 21* 29 28  GLUCOSE 124* 129* 132* 140*  < > 109 158* 118 108  BUN 15 15 13 18   < > 6.5* 11.8 8.7 10.0  CALCIUM 9.0 9.3 9.5 9.9  < > 9.4 10.1 9.4 9.6  CREATININE 1.06* 0.98 0.90 1.03*  < > 0.9 0.9 0.8 1.1  GFRNONAA 55* >60 >60 57*  --   --   --   --   --   GFRAA >60 >60 >60 >60  --   --   --   --   --   < > = values in this interval not displayed.  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 She requests sedation No new labs. Consent signed in chart  Electronically Signed: Ascencion Dike PA-C 04/12/2015, 12:46 PM   I spent a total of 20 minutes in face to face in clinical consultation, greater than 50% of which was counseling/coordinating care for image guided aspiration.

## 2015-04-12 NOTE — Discharge Instructions (Signed)
Moderate Conscious Sedation, Adult, Care After Refer to this sheet in the next few weeks. These instructions provide you with information on caring for yourself after your procedure. Your health care provider may also give you more specific instructions. Your treatment has been planned according to current medical practices, but problems sometimes occur. Call your health care provider if you have any problems or questions after your procedure. WHAT TO EXPECT AFTER THE PROCEDURE  After your procedure:  You may feel sleepy, clumsy, and have poor balance for several hours.  Vomiting may occur if you eat too soon after the procedure. HOME CARE INSTRUCTIONS  Do not participate in any activities where you could become injured for at least 24 hours. Do not:  Drive.  Swim.  Ride a bicycle.  Operate heavy machinery.  Cook.  Use power tools.  Climb ladders.  Work from a high place.  Do not make important decisions or sign legal documents until you are improved.  If you vomit, drink water, juice, or soup when you can drink without vomiting. Make sure you have little or no nausea before eating solid foods.  Only take over-the-counter or prescription medicines for pain, discomfort, or fever as directed by your health care provider.  Make sure you and your family fully understand everything about the medicines given to you, including what side effects may occur.  You should not drink alcohol, take sleeping pills, or take medicines that cause drowsiness for at least 24 hours.  If you smoke, do not smoke without supervision.  If you are feeling better, you may resume normal activities 24 hours after you were sedated.  Keep all appointments with your health care provider. SEEK MEDICAL CARE IF:  Your skin is pale or bluish in color.  You continue to feel nauseous or vomit.  Your pain is getting worse and is not helped by medicine.  You have bleeding or swelling.  You are still  sleepy or feeling clumsy after 24 hours. SEEK IMMEDIATE MEDICAL CARE IF:  You develop a rash.  You have difficulty breathing.  You develop any type of allergic problem.  You have a fever. MAKE SURE YOU:  Understand these instructions.  Will watch your condition.  Will get help right away if you are not doing well or get worse.   This information is not intended to replace advice given to you by your health care provider. Make sure you discuss any questions you have with your health care provider.   Document Released: 10/08/2012 Document Revised: 01/08/2014 Document Reviewed: 10/08/2012 Elsevier Interactive Patient Education 2016 [place] An incision is when a surgeon cuts into your body. After surgery, the incision needs to be cared for properly to prevent infection.  HOW TO CARE FOR YOUR INCISION  Take medicines only as directed by your health care provider.  There are many different ways to close and cover an incision, including stitches, skin glue, and adhesive strips. Follow your health care provider's instructions on:  Incision care.  Bandage (dressing) changes and removal.  Incision closure removal.  Do not take baths, swim, or use a hot tub until your health care provider approves. You may shower as directed by your health care provider.  Resume your normal diet and activities as directed.  Use anti-itch medicine (such as an antihistamine) as directed by your health care provider. The incision may itch while it is healing. Do not pick or scratch at the incision.  Drink enough fluid to  keep your urine clear or pale yellow.  Remove dressing in 24 hours and may shower SEEK MEDICAL CARE IF:   You have drainage, redness, swelling, or pain at your incision site.  You have muscle aches, chills, or a general ill feeling.  You notice a bad smell coming from the incision or dressing.  Your incision edges separate after the sutures, staples, or  skin adhesive strips have been removed.  You have persistent nausea or vomiting.  You have a fever.  You are dizzy. SEEK IMMEDIATE MEDICAL CARE IF:   You have a rash.  You faint.  You have difficulty breathing. MAKE SURE YOU:   Understand these instructions.  Will watch your condition.  Will get help right away if you are not doing well or get worse.   This information is not intended to replace advice given to you by your health care provider. Make sure you discuss any questions you have with your health care provider.   Document Released: 07/07/2004 Document Revised: 01/08/2014 Document Reviewed: 02/11/2013 Elsevier Interactive Patient Education [employer].

## 2015-04-17 ENCOUNTER — Other Ambulatory Visit: Payer: Self-pay | Admitting: Oncology

## 2015-04-17 DIAGNOSIS — C569 Malignant neoplasm of unspecified ovary: Secondary | ICD-10-CM

## 2015-04-18 ENCOUNTER — Telehealth: Payer: Self-pay | Admitting: Oncology

## 2015-04-18 ENCOUNTER — Encounter: Payer: Self-pay | Admitting: Oncology

## 2015-04-18 NOTE — Progress Notes (Signed)
Medical Oncology  Per HIM, patient declines genetics now  L.Marko Plume, MD

## 2015-04-18 NOTE — Telephone Encounter (Signed)
lvm for pt regarding to 5.8 appt.Marland KitchenMarland Kitchen

## 2015-04-21 ENCOUNTER — Other Ambulatory Visit (HOSPITAL_BASED_OUTPATIENT_CLINIC_OR_DEPARTMENT_OTHER): Payer: Self-pay

## 2015-04-21 ENCOUNTER — Encounter: Payer: Self-pay | Admitting: Oncology

## 2015-04-21 ENCOUNTER — Ambulatory Visit (HOSPITAL_BASED_OUTPATIENT_CLINIC_OR_DEPARTMENT_OTHER): Payer: Self-pay | Admitting: Oncology

## 2015-04-21 ENCOUNTER — Telehealth: Payer: Self-pay | Admitting: Oncology

## 2015-04-21 VITALS — BP 126/64 | HR 80 | Temp 98.5°F | Resp 18 | Ht 62.0 in | Wt 172.5 lb

## 2015-04-21 DIAGNOSIS — T451X5A Adverse effect of antineoplastic and immunosuppressive drugs, initial encounter: Secondary | ICD-10-CM

## 2015-04-21 DIAGNOSIS — I10 Essential (primary) hypertension: Secondary | ICD-10-CM

## 2015-04-21 DIAGNOSIS — Z8543 Personal history of malignant neoplasm of ovary: Secondary | ICD-10-CM

## 2015-04-21 DIAGNOSIS — I82402 Acute embolism and thrombosis of unspecified deep veins of left lower extremity: Secondary | ICD-10-CM

## 2015-04-21 DIAGNOSIS — C801 Malignant (primary) neoplasm, unspecified: Secondary | ICD-10-CM

## 2015-04-21 DIAGNOSIS — C778 Secondary and unspecified malignant neoplasm of lymph nodes of multiple regions: Secondary | ICD-10-CM

## 2015-04-21 DIAGNOSIS — Z8542 Personal history of malignant neoplasm of other parts of uterus: Secondary | ICD-10-CM

## 2015-04-21 DIAGNOSIS — C7801 Secondary malignant neoplasm of right lung: Secondary | ICD-10-CM

## 2015-04-21 DIAGNOSIS — G620 Drug-induced polyneuropathy: Secondary | ICD-10-CM

## 2015-04-21 DIAGNOSIS — R634 Abnormal weight loss: Secondary | ICD-10-CM

## 2015-04-21 DIAGNOSIS — D509 Iron deficiency anemia, unspecified: Secondary | ICD-10-CM

## 2015-04-21 DIAGNOSIS — C541 Malignant neoplasm of endometrium: Secondary | ICD-10-CM

## 2015-04-21 DIAGNOSIS — C7802 Secondary malignant neoplasm of left lung: Secondary | ICD-10-CM

## 2015-04-21 DIAGNOSIS — C569 Malignant neoplasm of unspecified ovary: Secondary | ICD-10-CM

## 2015-04-21 DIAGNOSIS — Z7901 Long term (current) use of anticoagulants: Secondary | ICD-10-CM

## 2015-04-21 DIAGNOSIS — I898 Other specified noninfective disorders of lymphatic vessels and lymph nodes: Secondary | ICD-10-CM

## 2015-04-21 LAB — COMPREHENSIVE METABOLIC PANEL
ALT: <9 U/L (ref 0–55)
ANION GAP: 11 meq/L (ref 3–11)
AST: 7 U/L (ref 5–34)
Albumin: 3 g/dL — ABNORMAL LOW (ref 3.5–5.0)
Alkaline Phosphatase: 136 U/L (ref 40–150)
BUN: 9.2 mg/dL (ref 7.0–26.0)
CHLORIDE: 104 meq/L (ref 98–109)
CO2: 27 meq/L (ref 22–29)
Calcium: 9.7 mg/dL (ref 8.4–10.4)
Creatinine: 0.9 mg/dL (ref 0.6–1.1)
EGFR: 75 mL/min/{1.73_m2} — AB (ref >90)
GLUCOSE: 116 mg/dL (ref 70–140)
Potassium: 3.5 mEq/L (ref 3.5–5.1)
SODIUM: 141 meq/L (ref 136–145)
Total Bilirubin: 0.53 mg/dL (ref 0.20–1.20)
Total Protein: 6.9 g/dL (ref 6.4–8.3)

## 2015-04-21 LAB — CBC WITH DIFFERENTIAL/PLATELET
BASO%: 0.4 % (ref 0.0–2.0)
Basophils Absolute: 0 10*3/uL (ref 0.0–0.1)
EOS ABS: 0.1 10*3/uL (ref 0.0–0.5)
EOS%: 1.2 % (ref 0.0–7.0)
HCT: 26.8 % — ABNORMAL LOW (ref 34.8–46.6)
HGB: 8.5 g/dL — ABNORMAL LOW (ref 11.6–15.9)
LYMPH%: 13.7 % — AB (ref 14.0–49.7)
MCH: 25.7 pg (ref 25.1–34.0)
MCHC: 31.7 g/dL (ref 31.5–36.0)
MCV: 81 fL (ref 79.5–101.0)
MONO#: 0.6 10*3/uL (ref 0.1–0.9)
MONO%: 5.6 % (ref 0.0–14.0)
NEUT%: 79.1 % — AB (ref 38.4–76.8)
NEUTROS ABS: 8 10*3/uL — AB (ref 1.5–6.5)
PLATELETS: 251 10*3/uL (ref 145–400)
RBC: 3.31 10*6/uL — AB (ref 3.70–5.45)
RDW: 26.4 % — ABNORMAL HIGH (ref 11.2–14.5)
WBC: 10.2 10*3/uL (ref 3.9–10.3)
lymph#: 1.4 10*3/uL (ref 0.9–3.3)

## 2015-04-21 MED ORDER — ZOLPIDEM TARTRATE 5 MG PO TABS: 5.0000 mg | ORAL_TABLET | Freq: Every evening | ORAL | Status: DC | PRN

## 2015-04-21 MED ORDER — DEXAMETHASONE 4 MG PO TABS: 8.0000 mg | ORAL_TABLET | Freq: Two times a day (BID) | ORAL | Status: DC

## 2015-04-21 NOTE — Progress Notes (Signed)
OFFICE PROGRESS NOTE   April 22, 2015   Physicians: E.Rossi, J.Kinard, Verl Blalock, [facility]) New patient referral made to [facility] for PCP, not established yet.  Referral to Dr Frederich Cha  INTERVAL HISTORY:  Patient is seen, together with daughter, in continuing attention to metastatic gyn carcinoma involving lungs, extensive adenopathy chest/ abdomen/ pelvis including right inguinal nodal mass, and left psoas into L4-5 vertebral bodies. She continues carboplatin taxotere chemotherapy, cycle 3 given 04-07-15 with neulasta support. She is tolerating treatment fairly well and is clinically improving.  She had aspiration of 180 cc from right inguinal area by IR on 04-12-15, previously aspirated also by IR on 02-15-15 (with diagnostic core biopsy then). Per patient, the fluid reaccumulated again within 1-2 days following most recent procedure. The area is uncomfortable, tho not painful.   She continues Eliquis for LLE DVT 02-2015.  Otherwise she seems better since last chemo, with less nausea / no antiemetics needed since last week, more active at home, not much back discomfort and no new or different pain otherwise. She is eating and bowels are moving. She has slight peripheral neuropathy in toes and some difficulty gripping with hands. She has had no fever or symptoms of infection. She denies SOB, cough, chest pain. Bladder ok. No bleeding. No LE swelling. Peripheral IV access obtained with 1 attempt for chemo.  Remainder of 10 point Review of Systems negative/ unchanged.    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 She was lost to follow up for the gyn cancer after 11-2010, until presented with recurrent disease. Patient reported right inguinal mass for ~ 2 months, then LLE pain for ~ 3 weeks when she presented to ED on 02-12-15. CT AP showed nodules in lung bases suspicious for metastatic disease, liver not remarkable, no hydronephrosis, 6 mm right renal calculus, thrombus left external iliac vein into left common femoral vein, retroperitoneal and pelvic adenopathy, an 8.5 x 7.1 cm necrotic right inguinal node, post hyst ooph, no ascites, likely tumor involvement in left psoas with adjacent involvement of L4 and L5 vertebral bodies; CT chest subsequently had multiple bilateral pulmonary nodules and central adenopathy.  Patient was begun on heparin qtt which was transitioned to Eliquis (on pharmaceutical assistance program).   CA 125 from 02-14-15 was 642.9, this having been 3.6 in 08-2010. Pathology from right inguinal nodal mass aspiration and core needle biopsy 02-15-15 necrotic material with metastatic adenocarcinoma with immunostains consistent with gyn primary ([id number], ER+). She was transfused 2 units PRBCs for hgb 6.7. Iron studies 2-12 with serum iron 14 and %sat 7, given feraheme x2. Dr Denman George saw in consultation prior to starting carboplatin taxotere on 02-24-15.   Objective:  Vital signs in last 24 hours:  BP 126/64 mmHg  Pulse 80  Temp(Src) 98.5 F (36.9 C) (Oral)  Resp 18  Ht _0  (1.575 m)  Wt 172 lb 8 oz (78.245 kg)  BMI 31.54 kg/m2  SpO2 100% Weight stable. Looks comfortable, respirations not labored. Alert, oriented and appropriate. Ambulatory without difficulty.  Alopecia  HEENT:PERRL, sclerae not icteric. Oral mucosa moist without  lesions, posterior pharynx clear.  Neck supple. No JVD.  Lymphatics:no cervical,supraclavicular adenopathy. Mass at least 8 cm diameter medial right  inguinal, skin tight without breakdown, no heat or erythema, area not tender. Resp: no wheezes, rales, crackles bilaterally tho BS somewhat diminished thruout, no use of accessory muscles.  Cardio: regular rate and rhythm. No gallop. GI: soft, nontender, less distended. Normally active bowel sounds.  Musculoskeletal/ Extremities: without pitting edema, cords, tenderness bilaterally  Neuro: mild peripheral neuropathy as noted. Speech fluent, moves all extremities equally, no focal deficits otherwise. PSYCH appropriate mood and affect.  Skin without rash, ecchymosis, petechiae  Right groin area also seen by gyn onc NP, for continuity with surgery clinic.   Lab Results:  Results for orders placed or performed in visit on 04/21/15  CBC with Differential  Result Value Ref Range   WBC 10.2 3.9 - 10.3 10e3/uL   NEUT# 8.0 (H) 1.5 - 6.5 10e3/uL   HGB 8.5 (L) 11.6 - 15.9 g/dL   HCT 26.8 (L) 34.8 - 46.6 %   Platelets 251 145 - 400 10e3/uL   MCV 81.0 79.5 - 101.0 fL   MCH 25.7 25.1 - 34.0 pg   MCHC 31.7 31.5 - 36.0 g/dL   RBC 3.31 (L) 3.70 - 5.45 10e6/uL   RDW 26.4 (H) 11.2 - 14.5 %   lymph# 1.4 0.9 - 3.3 10e3/uL   MONO# 0.6 0.1 - 0.9 10e3/uL   Eosinophils Absolute 0.1 0.0 - 0.5 10e3/uL   Basophils Absolute 0.0 0.0 - 0.1 10e3/uL   NEUT% 79.1 (H) 38.4 - 76.8 %   LYMPH% 13.7 (L) 14.0 - 49.7 %   MONO% 5.6 0.0 - 14.0 %   EOS% 1.2 0.0 - 7.0 %   BASO% 0.4 0.0 - 2.0 %  Comprehensive metabolic panel  Result Value Ref Range   Sodium 141 136 - 145 mEq/L   Potassium 3.5 3.5 - 5.1 mEq/L   Chloride 104 98 - 109 mEq/L   CO2 27 22 - 29 mEq/L   Glucose 116 70 - 140 mg/dl   BUN 9.2 7.0 - 26.0 mg/dL   Creatinine 0.9 0.6 - 1.1 mg/dL   Total Bilirubin 0.53 0.20 - 1.20 mg/dL   Alkaline Phosphatase 136 40 - 150 U/L   AST 7 5 - 34 U/L   ALT <9 0 - 55 U/L   Total Protein 6.9 6.4 - 8.3 g/dL   Albumin 3.0 (L) 3.5 - 5.0 g/dL   Calcium 9.7 8.4 - 10.4 mg/dL   Anion Gap 11 3 - 11 mEq/L   EGFR 75 (L) >90  ml/min/1.73 m2  CA 125  Result Value Ref Range   Cancer Antigen (CA) 125 111.9 (H) 0.0 - 38.1 U/mL   CA 125 was 427 on 03-14-15 and 642 on 02-14-15.  Studies/Results: History of endometrial and ovarian cancer, now with recurrent symptomatic necrotic/cystic right inguinal lymph node.  Request made for ultrasound-guided aspiration of the cystic component of the lymph node for therapeutic purposes.  EXAM: US ASPIRATION  COMPARISON: CT of the chest - 02/15/2015; CT abdomen pelvis - 02/12/2015; ultrasound-guided right inguinal lymph node core needle biopsy and aspiration -02/13/2025  MEDICATIONS: Ancef 2 g IV ; the antibiotic was administered within an appropriate time frame from wing the right inguinal lymph node aspiration.  ANESTHESIA/SEDATION: Moderate (conscious) sedation was employed during this procedure. A total of Versed 1 mg and Fentanyl 50 mcg was administered intravenously.  Moderate Sedation Time: 10 minutes. The patient's level of consciousness and  vital signs were monitored continuously by radiology nursing throughout the procedure under my direct supervision.  CONTRAST: None  COMPLICATIONS: None immediate.  PROCEDURE: Preprocedural ultrasound scanning demonstrated a markedly enlarged (at least 6.9 x 4.8 x 6.3 cm) necrotic right inguinal lymph node, compatible with with the findings on prior CT the abdomen pelvis performed 02/12/2015.  The procedure was planned the skin overlying the right groin was prepped and draped in usual sterile fashion. After the overlying soft tissues were anesthetized with 1% lidocaine with epinephrine, a 7 [place] Yueh sheath needle was advanced into the dominant cystic component of the necrotic lymph node. An ultrasound image was saved for procedural documentation purposes. Approximately 60 cc of brown colored fluid was aspirated from the cystic component of the necrotic right inguinal lymph node. Following Yueh sheath  removal, an additional approximately 120 cc of brown colored fluid was expressed from the necrotic lymph node with manual compression.  Postprocedural ultrasound images demonstrate near complete resolution of nearly all of the cystic component of the necrotic right inguinal lymph node (representative images 7-9).  A dressing was placed. The patient tolerated the procedure well without immediate postprocedural complication.  IMPRESSION: Successful ultrasound-guided aspiration of a total of approximately 180 cc of brown colored fluid from symptomatic necrotic right inguinal lymph node.  Note, if the cystic component of this lymph node reoccurs in the come symptomatic, the patient may benefit from a surgical resection.   Medications: I have reviewed the patient's current medications.  DISCUSSION Interval history discussed as above.  Rapid reaccumulation of fluid in right inguinal lymphocoele or cystic area of necrotic malignant nodal mass: possibly needs excision or drain as aspiration not helping adequately (doubt radiation, but have not discussed with rad onc). I spoke directly with [facility], however no insurance so referral there cannot happen promptly; however we are able to get appointment with Dr Rolanda Jay at [facility] 04-26-15. Offered aspiration now by Dr Skeet Latch to palliate next day or so , however patient prefers to wait until seen by Dr Rolanda Jay next week.   Patient asking about total # chemo treatments needed. We have discussed that treatment in metastatic setting is generally done to maximum benefit rather than set # as with adjuvant therapy. Expect to repeat scans later, however for now continued improvement clinically and with CA 125 marker indicates response ongoing.    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 Present metastatic disease involves large necrotic right inguinal node, paraaortic/ aortocaval/ external iliac nodes, and multiple bilateral pulmonary mets. CA 125 improving with chemo thus far. Has not had zometa.  She will have cycle 4 carbo taxotere on 04-28-15 as long as [place] >=1.5 and plt >=100k, and if carbo skin test negative. Decadron x 3 days around taxotere, neulasta.  Genetics counseling planned, which may give option for treatment with PARP inhibitor.  2.LLE DVT documented 02-12-15: on Eliquis, no longer symptomatic, no bleeding. No IVC filter. Eliquis approved thru [employer] assistance x 1 year  3.Increase in right inguinal mass within 2 days of aspiration of 180 cc on 04-12-15. Surgery consult for management options of  lymphocyst/ cystic area in malignant lymph node mass.  4.severe iron deficiency anemia: etiology not clear.  UA was negative for blood, needs FOB. Feraheme x 2 doses done.  Hemoglobin stable at 8.5, would need PRBCs if <8 or more symptoms. 5. chemo nausea and vomiting: Used aloxi with cycle 2 and now on protonix, symptoms improved. Continue aloxi and protonix 6.uncontrolled HTN initially:  on labetalol 200 mg tid per urgent care + clonidine 0.1 mg tid and HCTZ. BP much better now. Needs PCP, or if more problems prior will try to get her to cardiology 7.recent weight loss, po intake improving, nausea better. [place] nutritionist involved. 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 Stable or slightly progressive, however benefit from taxotere seems appropriate to continue. 11.Social: still listed as medicaid pending, Energy manager involved. 12.difficult IV access not easy tho still manageable. Would need to be off Eliquis x 48 hrs for PAC by IR. 13.overdue  mammograms, general medical care, colonoscopy. Noncompliance with follow up after cancer diagnoses and adjuvant treatment after ~ 2012. Noncompliant with care for HTN over past several years,  despite family efforts ( daughter is pediatrician) Needs PCP but has not followed thru with referral to [facility]. 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 Repeat colonoscopy recommended 10-2009, apparently not done. Note iron deficiency. FOB not found in last hospitalization, ordered with next labs.  All questions answered and patient is in agreement with recommendations and plans. Time spent 30 min including >50% counseling and coordination of care. Cc Dr Rolanda Jay.   Gordy Levan, MD   04/22/2015, 9:19 PM

## 2015-04-21 NOTE — Telephone Encounter (Signed)
Added ov appt 5/8 at 330 pm per LL 4/20 pof. avs printed

## 2015-04-22 DIAGNOSIS — I898 Other specified noninfective disorders of lymphatic vessels and lymph nodes: Secondary | ICD-10-CM | POA: Insufficient documentation

## 2015-04-22 LAB — CA 125: Cancer Antigen (CA) 125: 111.9 U/mL — ABNORMAL HIGH (ref 0.0–38.1)

## 2015-04-26 ENCOUNTER — Encounter: Payer: Self-pay | Admitting: General Surgery

## 2015-04-26 ENCOUNTER — Ambulatory Visit (HOSPITAL_BASED_OUTPATIENT_CLINIC_OR_DEPARTMENT_OTHER): Payer: Self-pay | Admitting: General Surgery

## 2015-04-26 VITALS — BP 159/89 | HR 97 | Temp 98.4°F | Resp 18 | Ht 62.0 in | Wt 173.2 lb

## 2015-04-26 DIAGNOSIS — R1909 Other intra-abdominal and pelvic swelling, mass and lump: Secondary | ICD-10-CM

## 2015-04-26 DIAGNOSIS — I82422 Acute embolism and thrombosis of left iliac vein: Secondary | ICD-10-CM

## 2015-04-26 DIAGNOSIS — I82412 Acute embolism and thrombosis of left femoral vein: Secondary | ICD-10-CM

## 2015-04-26 DIAGNOSIS — C55 Malignant neoplasm of uterus, part unspecified: Secondary | ICD-10-CM

## 2015-04-26 NOTE — Progress Notes (Signed)
[facility] CONSULT NOTE  Patient Care Team: No Pcp Per Patient as PCP - General (General Practice)  CHIEF COMPLAINTS/PURPOSE OF CONSULTATION: Recurrent right inguinal fluid collection   HISTORY OF PRESENTING ILLNESS:  [name] 60 y.o. female is here for a recurring Right Inguinal fluid collection thought to be a result of a necrotic lymph node that had responded to systemic chemotherapy for the treatment of a metastatic endometrial cancer. This fluid collection was aspirated once on February 14 of this year and a subsequent aspiration performed on April 11 this year. however rapidly reaccumulated. This has caused her quite a bit of discomfort and difficulty in ambulation. She was diagnosed with a deep venous thrombosis documented on 02/12/2015 involving the left iliac vein extending into the left common femoral vein and distally into the left lower extremity likely secondary to compression from extrinsic tumor in the pelvis  She is now on Eliquis that is being managed by Dr. Marko Plume. On specific questioning she reports that the swelling in her right groin was the presenting symptom associated with left lower extremity swelling prompted her to go to the emergency department where the diagnosis was made. She notes that there had been a steady increase in the size of the right groin mass when she began systemic chemotherapy however after the aspirations rapidly increased in size each time it was aspirated. She reports she has radiating pain going down her anterior lateral thigh at times and a general sensation of heaviness and bilateral lower extremities. She denies any falls, buckling of her legs, or specific weakness. She is currently scheduled for another cycle of chemotherapy in 2 days.  I reviewed her records extensively and collaborated the history with the patient.  SUMMARY OF ONCOLOGIC HISTORY:  Metastatic uterine cancer under active treatment with systemic chemotherapy      MEDICAL HISTORY:  Past Medical History  Diagnosis Date  . History of radiation therapy 10/21/08-11/23/08 & 12/07/10/13/12/23 2010    ENDOMETRIOID   . Uterine cancer (HCC)     ENDOMETRIAL CA& OVARY CANCER  . Hypertension     SURGICAL HISTORY: Past Surgical History  Procedure Laterality Date  . Tubal ligation    . Abdominal hysterectomy      SOCIAL HISTORY: Social History   Social History  . Marital Status: Married    Spouse Name: N/A  . Number of Children: N/A  . Years of Education: N/A   Occupational History  . Not on file.   Social History Main Topics  . Smoking status: Never Smoker   . Smokeless tobacco: Never Used  . Alcohol Use: No  . Drug Use: No  . Sexual Activity: Not on file   Other Topics Concern  . Not on file   Social History Narrative    FAMILY HISTORY: Family History  Problem Relation Age of Onset  . Bone cancer Paternal Grandmother     OSSEOUS METASTASIS    ALLERGIES:  has no active allergies.  MEDICATIONS:  Current Outpatient Prescriptions  Medication Sig Dispense Refill  . apixaban (ELIQUIS) 5 MG TABS tablet Take 1 tablet (5 mg total) by mouth 2 (two) times daily. 60 tablet 0  . cloNIDine (CATAPRES) 0.1 MG tablet Take 0.1 mg by mouth 3 (three) times daily.     Marland Kitchen dexamethasone (DECADRON) 4 MG tablet Take 2 tablets (8 mg total) by mouth 2 (two) times daily. For 3 days.  Start the day before Taxotere. 12 tablet 0  . ferrous sulfate 325 (65  FE) MG tablet Take 1 tablet (325 mg total) by mouth 2 (two) times daily with a meal. 60 tablet 3  . hydrochlorothiazide (HYDRODIURIL) 25 MG tablet Take 25 mg by mouth daily.      Marland Kitchen labetalol (NORMODYNE) 100 MG tablet Take 2 tablets (200 mg total) by mouth 3 (three) times daily. 180 tablet 0  . morphine (MSIR) 15 MG tablet Take 1 tablet (15 mg total) by mouth every 4 (four) hours as needed for moderate pain or severe pain. 20 tablet 0  . pantoprazole (PROTONIX) 40 MG tablet Take 1 tablet (40 mg total) by  mouth daily. 30 tablet 2  . polyethylene glycol (MIRALAX / GLYCOLAX) packet Take 17 g by mouth 2 (two) times daily as needed.    . potassium chloride (K-DUR) 10 MEQ tablet Take 2 tablets today and then 1 tablet daily 30 tablet 0  . LORazepam (ATIVAN) 0.5 MG tablet Place 1 tablet under the tongue or swallow every 6 hrs as needed for nausea. (Patient not taking: Reported on 04/26/2015) 30 tablet 0  . prochlorperazine (COMPAZINE) 10 MG tablet Take 1 tablet (10 mg total) by mouth every 6 (six) hours as needed (Nausea or vomiting). (Patient not taking: Reported on 04/26/2015) 30 tablet 1  . zolpidem (AMBIEN) 5 MG tablet Take 1 tablet (5 mg total) by mouth at bedtime as needed for sleep. (Patient not taking: Reported on 04/26/2015) 30 tablet 2       REVIEW OF SYSTEMS:   Constitutional: Denies fevers, chills or abnormal night sweats Eyes: Denies blurriness of vision, double vision or watery eyes Ears, nose, mouth, throat, and face: Denies mucositis or sore throat Respiratory: Denies cough, dyspnea or wheezes Cardiovascular: Denies palpitation, chest discomfort or lower extremity swelling Gastrointestinal:  Denies nausea, heartburn or change in bowel habits Skin: Denies abnormal skin rashes Lymphatics: Denies new lymphadenopathy or easy bruising Neurological:Denies numbness, tingling or new weaknesses Behavioral/Psych: Mood is stable, no new changes   All other systems were reviewed with the patient and are negative.  PHYSICAL EXAMINATION: ECOG PERFORMANCE STATUS: 2 - Symptomatic, <50% confined to bed  Filed Vitals:   04/26/15 1408  BP: 159/89  Pulse: 97  Temp: 98.4 F (36.9 C)  Resp: 18   Filed Weights   04/26/15 1408  Weight: 173 lb 4 oz (78.586 kg)    GENERAL:alert, no distress and comfortable SKIN: skin color, texture, turgor are normal, no rashes or significant lesions EYES: normal, conjunctiva are pink and non-injected, sclera clear OROPHARYNX:no exudate, no erythema and lips,  buccal mucosa, and tongue normal  NECK: supple, thyroid normal size, non-tender, without nodularity LYMPH:  no palpable lymphadenopathy in the cervical, axillary or inguinal LUNGS: clear to auscultation and percussion with normal breathing effort HEART: Tachycardic rate & rhythm and no murmurs and no lower extremity edema ABDOMEN:abdomen soft, non-tender and normal bowel sounds. Well-healed cesarean section scar. There is a tense, slightly compressible right inguinal 10 x 6 x 6 cm mass. No overlying skin changes other than spotty vitiligo. Nontender. No erythema, warmth or obvious discharge. Of note there were a few drops of serosanguineous fluid on her underclothes. I was unable to appreciate any openings of the skin are active drainage on examination. Musculoskeletal:no cyanosis of digits and no clubbing  PSYCH: alert & oriented x 3 with fluent speech NEURO: no focal motor/sensory deficits (exam performed in the presence of a chaperone)   LABORATORY DATA:  I have reviewed the data as listed Lab Results  Component Value Date  WBC 10.2 04/21/2015   HGB 8.5* 04/21/2015   HCT 26.8* 04/21/2015   MCV 81.0 04/21/2015   PLT 251 04/21/2015   Lab Results  Component Value Date   NA 141 04/21/2015   K 3.5 04/21/2015   CL 100* 02/17/2015   CO2 27 04/21/2015    RADIOGRAPHIC STUDIES: I have personally reviewed the radiological reports and agreed with the findings in the report.  ASSESSMENT AND PLAN:  Patient under active chemotherapy treatment for metastatic uterine cancer with recent DVT requiring anticoagulation therapy who has a right inguinal fluid collection that is status post repeated aspiration drainage with reaccumulation.  Options discussed included observation versus repeat aspiration versus aspiration with ablation with sclerotic agent versus open drainage with mobilization of sartorius muscle to cover the femoral vessels and open wound VAC management to allow for secondary  closure.  After an extended discussion she is elected to move forward with open resection with mobilization of the sartorius muscle and wound VAC placement. She understands the risk and benefits the procedure including bleeding infection prolonged wound healing, particularly while under chemotherapy. She understands she could have weakness secondary to transposition of the muscle as well as weakness of the right lower extremity related to the nerve branches passing through this region and she understands that she will have a high likelihood of persistent numbness or tingling of the right thigh and lateral thigh. All questions were answered we will be coordinating with Dr. Marko Plume regarding the timing of discontinuation of anticoagulation and her upcoming chemotherapy.   All questions were answered. The patient knows to call the clinic with any problems, questions or concerns.    Frederich Cha, MD 2:15 PM

## 2015-04-26 NOTE — Patient Instructions (Signed)
We will reach out to Dr. Marko Plume about when to stop your Eliquis and I will give you a call with a surgery date and time.  Please call for any questions or concerns.  Vacuum-Assisted Closure Therapy Vacuum-assisted closure (VAC) therapy uses a device that removes fluid and germs from wounds to help them heal. It is used on wounds that cannot be closed with stitches. They often heal slowly. Vacuum-assisted therapy helps the wound stay clean and healthy while the open wound slowly grows back together. Vacuum-assisted closure therapy uses a bandage (dressing) that is made of foam. It is put inside the wound. Then, a drape is placed over the wound. This drape sticks to your skin to keep air out, and to protect the wound. A tube is hooked up to a small pump and is attached to the drape. The pump sucks out the fluid and germs. Vacuum-assisted closure therapy can also help reduce the bad smell that comes from the wound. HOW DOES IT WORK?  The vacuum pump pulls fluid through the foam dressing. The dressing may wrinkle during this process. The fluid goes into the tube and away from the wound. The fluid then goes into a container. The fluid in the container must be replaced if it is full or at least once a week, even if the container is not full. The pulling from the pump helps to close the wound and bring better circulation to the wound area. The foam dressing covers and protects the wound. It helps your wound heal faster.  HOW DOES IT FEEL?   You might feel a little pulling when the pump is on.  You might also feel a mild vibrating sensation.  You might feel some discomfort when the dressing is taken off. CAN I MOVE AROUND WITH VACUUM-ASSISTED CLOSURE THERAPY? Yes, it has a backup battery which is used when the machine is not plugged in, as long as the battery is working, you can move freely. WHAT ARE SOME THINGS I MUST KNOW?  Do not turn off the pump yourself, unless instructed to do so by your  healthcare provider, such as for bathing.  Do not take off the dressing yourself, unless instructed to do so by your caregiver.  You can wash or shower with the dressing. However, do not take the pump into the shower. Make sure the wound dressing is protected and covered with plastic. The wound area must stay dry.  Do not turn off the pump for more than 2 hours. If the pump is off for more than 2 hours, your nurse must change your dressing.  Check frequently that the machine is on, that the machine indicates the therapy is on, and that all clamps are open. THE ALARM IS SOUNDING! WHAT SHOULD I DO?   Stay calm.  Do not turn off the pump or do anything with the dressing.  Call your clinic or caregiver right away if the alarm goes off and you cannot fix the problem. Some reasons the alarm might go off include:  The fluid collection container is full.  The battery is low.  The dressing has a leak.  Explain to your caregiver what is happening. Follow the instructions you receive. WHEN SHOULD I CALL FOR HELP?   You have severe pain.  You have difficulty breathing.  You have bleeding that will not stop.  Your wound smells bad.  You have redness, swelling, or fluid leaking from your wound.  Your alarm goes off and you do not  know what to do.  You have a fever.  Your wound itches severely.  Your dressing changes are often painful or bleeding often occurs.  You have diarrhea.  You have a sore throat.  You have a rash around the dressing or anywhere else on your body.  You feel nauseous.  You feel dizzy or weak.  The [facility] machine has been off for more than 2 hours. HOW DO I GET READY TO GO HOME WITH A PUMP?  A trained caregiver will talk to you and answer your questions about your vacuum-assisted closure therapy before you go home. He or she will explain what to expect. A caregiver will come to your home to apply the pump and care for your wound. The at-home caregiver will  be available for questions and will come back for the scheduled dressing changes, usually every 48-72 hours (or more often for severely infected wounds). Your at-home caregiver will also come if you are having an unexpected problem. If you have questions or do not know what to do when you go home, talk to your healthcare provider.   This information is not intended to replace advice given to you by your health care provider. Make sure you discuss any questions you have with your health care provider.   Document Released: 12/01/2007 Document Revised: 08/20/2012 Document Reviewed: 12/01/2010 Elsevier Interactive Patient Education [employer].

## 2015-04-27 ENCOUNTER — Other Ambulatory Visit: Payer: Self-pay | Admitting: Oncology

## 2015-04-27 ENCOUNTER — Encounter: Payer: Self-pay | Admitting: Oncology

## 2015-04-27 NOTE — Progress Notes (Signed)
Medical Oncology  Patient reacted to [place] skin test on 04-07-15 such that [place] was NOT administered that day (tho listed as given on flowsheet). She should not receive carboplatin on 4-27 or subsequently. Continue taxotere as single agent for now. Chemo orders corrected. Appreciate notification by [facility] pharmacist.  Godfrey Pick, MD

## 2015-04-28 ENCOUNTER — Other Ambulatory Visit (HOSPITAL_BASED_OUTPATIENT_CLINIC_OR_DEPARTMENT_OTHER): Payer: Self-pay

## 2015-04-28 ENCOUNTER — Ambulatory Visit (HOSPITAL_BASED_OUTPATIENT_CLINIC_OR_DEPARTMENT_OTHER): Payer: Self-pay

## 2015-04-28 VITALS — BP 158/79 | HR 82 | Temp 98.3°F | Resp 18

## 2015-04-28 DIAGNOSIS — C778 Secondary and unspecified malignant neoplasm of lymph nodes of multiple regions: Secondary | ICD-10-CM

## 2015-04-28 DIAGNOSIS — C541 Malignant neoplasm of endometrium: Secondary | ICD-10-CM

## 2015-04-28 DIAGNOSIS — Z5111 Encounter for antineoplastic chemotherapy: Secondary | ICD-10-CM

## 2015-04-28 DIAGNOSIS — C801 Malignant (primary) neoplasm, unspecified: Secondary | ICD-10-CM

## 2015-04-28 LAB — COMPREHENSIVE METABOLIC PANEL
ALBUMIN: 3.2 g/dL — AB (ref 3.5–5.0)
ALK PHOS: 115 U/L (ref 40–150)
ALT: <9 U/L (ref 0–55)
Anion Gap: 12 mEq/L — ABNORMAL HIGH (ref 3–11)
BUN: 12 mg/dL (ref 7.0–26.0)
CO2: 21 meq/L — AB (ref 22–29)
Calcium: 10.1 mg/dL (ref 8.4–10.4)
Chloride: 104 mEq/L (ref 98–109)
Creatinine: 1.1 mg/dL (ref 0.6–1.1)
EGFR: 62 mL/min/{1.73_m2} — AB (ref >90)
GLUCOSE: 228 mg/dL — AB (ref 70–140)
POTASSIUM: 3.8 meq/L (ref 3.5–5.1)
SODIUM: 138 meq/L (ref 136–145)
TOTAL PROTEIN: 7.4 g/dL (ref 6.4–8.3)
Total Bilirubin: 0.42 mg/dL (ref 0.20–1.20)

## 2015-04-28 LAB — CBC WITH DIFFERENTIAL/PLATELET
BASO%: 0.4 % (ref 0.0–2.0)
Basophils Absolute: 0.1 10*3/uL (ref 0.0–0.1)
EOS%: 0 % (ref 0.0–7.0)
Eosinophils Absolute: 0 10*3/uL (ref 0.0–0.5)
HCT: 27.8 % — ABNORMAL LOW (ref 34.8–46.6)
HEMOGLOBIN: 8.9 g/dL — AB (ref 11.6–15.9)
LYMPH%: 6.9 % — AB (ref 14.0–49.7)
MCH: 26.1 pg (ref 25.1–34.0)
MCHC: 31.9 g/dL (ref 31.5–36.0)
MCV: 81.6 fL (ref 79.5–101.0)
MONO#: 0.1 10*3/uL (ref 0.1–0.9)
MONO%: 0.9 % (ref 0.0–14.0)
NEUT%: 91.8 % — ABNORMAL HIGH (ref 38.4–76.8)
NEUTROS ABS: 11.9 10*3/uL — AB (ref 1.5–6.5)
Platelets: 377 10*3/uL (ref 145–400)
RBC: 3.4 10*6/uL — AB (ref 3.70–5.45)
RDW: 28 % — AB (ref 11.2–14.5)
WBC: 12.9 10*3/uL — AB (ref 3.9–10.3)
lymph#: 0.9 10*3/uL (ref 0.9–3.3)

## 2015-04-28 MED ORDER — SODIUM CHLORIDE 0.9 % IV SOLN
60.0000 mg/m2 | Freq: Once | INTRAVENOUS | Status: AC
  Administered 2015-04-28: 110 mg via INTRAVENOUS
  Filled 2015-04-28: qty 11

## 2015-04-28 MED ORDER — PALONOSETRON HCL INJECTION 0.25 MG/5ML
0.2500 mg | Freq: Once | INTRAVENOUS | Status: AC
  Administered 2015-04-28: 0.25 mg via INTRAVENOUS

## 2015-04-28 MED ORDER — DEXAMETHASONE SODIUM PHOSPHATE 100 MG/10ML IJ SOLN
10.0000 mg | Freq: Once | INTRAMUSCULAR | Status: AC
  Administered 2015-04-28: 10 mg via INTRAVENOUS
  Filled 2015-04-28: qty 1

## 2015-04-28 MED ORDER — SODIUM CHLORIDE 0.9 % IV SOLN
Freq: Once | INTRAVENOUS | Status: AC
  Administered 2015-04-28: 10:00:00 via INTRAVENOUS

## 2015-04-28 MED ORDER — PALONOSETRON HCL INJECTION 0.25 MG/5ML
INTRAVENOUS | Status: AC
  Filled 2015-04-28: qty 5

## 2015-04-28 MED ORDER — LORAZEPAM 1 MG PO TABS: 0.5000 mg | ORAL_TABLET | Freq: Once | ORAL | Status: DC | PRN

## 2015-04-28 NOTE — Patient Instructions (Signed)
Docetaxel injection  What is this medicine?  DOCETAXEL (doe se TAX el) is a chemotherapy drug. It targets fast dividing cells, like cancer cells, and causes these cells to die. This medicine is used to treat many types of cancers like breast cancer, certain stomach cancers, head and neck cancer, lung cancer, and prostate cancer.  This medicine may be used for other purposes; ask your health care provider or pharmacist if you have questions.  What should I tell my health care provider before I take this medicine?  They need to know if you have any of these conditions:  -infection (especially a virus infection such as chickenpox, cold sores, or herpes)  -liver disease  -low blood counts, like low white cell, platelet, or red cell counts  -an unusual or allergic reaction to docetaxel, polysorbate 80, other chemotherapy agents, other medicines, foods, dyes, or preservatives  -pregnant or trying to get pregnant  -breast-feeding  How should I use this medicine?  This drug is given as an infusion into a vein. It is administered in a hospital or clinic by a specially trained health care professional.  Talk to your pediatrician regarding the use of this medicine in children. Special care may be needed.  Overdosage: If you think you have taken too much of this medicine contact a poison control center or emergency room at once.  NOTE: This medicine is only for you. Do not share this medicine with others.  What if I miss a dose?  It is important not to miss your dose. Call your doctor or health care professional if you are unable to keep an appointment.  What may interact with this medicine?  -cyclosporine  -erythromycin  -ketoconazole  -medicines to increase blood counts like filgrastim, pegfilgrastim, sargramostim  -vaccines  Talk to your doctor or health care professional before taking any of these medicines:  -acetaminophen  -aspirin  -ibuprofen  -ketoprofen  -naproxen  This list may not describe all possible interactions.  Give your health care provider a list of all the medicines, herbs, non-prescription drugs, or dietary supplements you use. Also tell them if you smoke, drink alcohol, or use illegal drugs. Some items may interact with your medicine.  What should I watch for while using this medicine?  Your condition will be monitored carefully while you are receiving this medicine. You will need important blood work done while you are taking this medicine.  This drug may make you feel generally unwell. This is not uncommon, as chemotherapy can affect healthy cells as well as cancer cells. Report any side effects. Continue your course of treatment even though you feel ill unless your doctor tells you to stop.  In some cases, you may be given additional medicines to help with side effects. Follow all directions for their use.  Call your doctor or health care professional for advice if you get a fever, chills or sore throat, or other symptoms of a cold or flu. Do not treat yourself. This drug decreases your body's ability to fight infections. Try to avoid being around people who are sick.  This medicine may increase your risk to bruise or bleed. Call your doctor or health care professional if you notice any unusual bleeding.  This medicine may contain alcohol in the product. You may get drowsy or dizzy. Do not drive, use machinery, or do anything that needs mental alertness until you know how this medicine affects you. Do not stand or sit up quickly, especially if you are an older   patient. This reduces the risk of dizzy or fainting spells. Avoid alcoholic drinks.  Do not become pregnant while taking this medicine. Women should inform their doctor if they wish to become pregnant or think they might be pregnant. There is a potential for serious side effects to an unborn child. Talk to your health care professional or pharmacist for more information. Do not breast-feed an infant while taking this medicine.  What side effects may I notice  from receiving this medicine?  Side effects that you should report to your doctor or health care professional as soon as possible:  -allergic reactions like skin rash, itching or hives, swelling of the face, lips, or tongue  -low blood counts - This drug may decrease the number of white blood cells, red blood cells and platelets. You may be at increased risk for infections and bleeding.  -signs of infection - fever or chills, cough, sore throat, pain or difficulty passing urine  -signs of decreased platelets or bleeding - bruising, pinpoint red spots on the skin, black, tarry stools, nosebleeds  -signs of decreased red blood cells - unusually weak or tired, fainting spells, lightheadedness  -breathing problems  -fast or irregular heartbeat  -low blood pressure  -mouth sores  -nausea and vomiting  -pain, swelling, redness or irritation at the injection site  -pain, tingling, numbness in the hands or feet  -swelling of the ankle, feet, hands  -weight gain  Side effects that usually do not require medical attention (report to your prescriber or health care professional if they continue or are bothersome):  -bone pain  -complete hair loss including hair on your head, underarms, pubic hair, eyebrows, and eyelashes  -diarrhea  -excessive tearing  -changes in the color of fingernails  -loosening of the fingernails  -nausea  -muscle pain  -red flush to skin  -sweating  -weak or tired  This list may not describe all possible side effects. Call your doctor for medical advice about side effects. You may report side effects to FDA at 1-800-FDA-1088.  Where should I keep my medicine?  This drug is given in a hospital or clinic and will not be stored at home.  NOTE: This sheet is a summary. It may not cover all possible information. If you have questions about this medicine, talk to your doctor, pharmacist, or health care provider.      2016, Elsevier/Gold Standard. (2014-01-04 16:04:57)

## 2015-04-30 ENCOUNTER — Ambulatory Visit (HOSPITAL_BASED_OUTPATIENT_CLINIC_OR_DEPARTMENT_OTHER): Payer: Self-pay

## 2015-04-30 VITALS — BP 172/73 | HR 64 | Temp 97.9°F | Resp 18

## 2015-04-30 DIAGNOSIS — C541 Malignant neoplasm of endometrium: Secondary | ICD-10-CM

## 2015-04-30 MED ORDER — PEGFILGRASTIM INJECTION 6 MG/0.6ML
6.0000 mg | Freq: Once | SUBCUTANEOUS | Status: AC
  Administered 2015-04-30: 6 mg via SUBCUTANEOUS

## 2015-04-30 NOTE — Patient Instructions (Signed)
Pegfilgrastim injection What is this medicine? PEGFILGRASTIM (PEG fil gra stim) is a long-acting granulocyte colony-stimulating factor that stimulates the growth of neutrophils, a type of white blood cell important in the body's fight against infection. It is used to reduce the incidence of fever and infection in patients with certain types of cancer who are receiving chemotherapy that affects the bone marrow, and to increase survival after being exposed to high doses of radiation. This medicine may be used for other purposes; ask your health care provider or pharmacist if you have questions. What should I tell my health care provider before I take this medicine? They need to know if you have any of these conditions: -kidney disease -latex allergy -ongoing radiation therapy -sickle cell disease -skin reactions to acrylic adhesives (On-Body Injector only) -an unusual or allergic reaction to pegfilgrastim, filgrastim, other medicines, foods, dyes, or preservatives -pregnant or trying to get pregnant -breast-feeding How should I use this medicine? This medicine is for injection under the skin. If you get this medicine at home, you will be taught how to prepare and give the pre-filled syringe or how to use the On-body Injector. Refer to the patient Instructions for Use for detailed instructions. Use exactly as directed. Take your medicine at regular intervals. Do not take your medicine more often than directed. It is important that you put your used needles and syringes in a special sharps container. Do not put them in a trash can. If you do not have a sharps container, call your pharmacist or healthcare provider to get one. Talk to your pediatrician regarding the use of this medicine in children. While this drug may be prescribed for selected conditions, precautions do apply. Overdosage: If you think you have taken too much of this medicine contact a poison control center or emergency room at  once. NOTE: This medicine is only for you. Do not share this medicine with others. What if I miss a dose? It is important not to miss your dose. Call your doctor or health care professional if you miss your dose. If you miss a dose due to an On-body Injector failure or leakage, a new dose should be administered as soon as possible using a single prefilled syringe for manual use. What may interact with this medicine? Interactions have not been studied. Give your health care provider a list of all the medicines, herbs, non-prescription drugs, or dietary supplements you use. Also tell them if you smoke, drink alcohol, or use illegal drugs. Some items may interact with your medicine. This list may not describe all possible interactions. Give your health care provider a list of all the medicines, herbs, non-prescription drugs, or dietary supplements you use. Also tell them if you smoke, drink alcohol, or use illegal drugs. Some items may interact with your medicine. What should I watch for while using this medicine? You may need blood work done while you are taking this medicine. If you are going to need a MRI, CT scan, or other procedure, tell your doctor that you are using this medicine (On-Body Injector only). What side effects may I notice from receiving this medicine? Side effects that you should report to your doctor or health care professional as soon as possible: -allergic reactions like skin rash, itching or hives, swelling of the face, lips, or tongue -dizziness -fever -pain, redness, or irritation at site where injected -pinpoint red spots on the skin -red or dark-brown urine -shortness of breath or breathing problems -stomach or side pain, or pain   at the shoulder -swelling -tiredness -trouble passing urine or change in the amount of urine Side effects that usually do not require medical attention (report to your doctor or health care professional if they continue or are  bothersome): -bone pain -muscle pain This list may not describe all possible side effects. Call your doctor for medical advice about side effects. You may report side effects to FDA at 1-800-FDA-1088. Where should I keep my medicine? Keep out of the reach of children. Store pre-filled syringes in a refrigerator between 2 and 8 degrees C (36 and 46 degrees F). Do not freeze. Keep in carton to protect from light. Throw away this medicine if it is left out of the refrigerator for more than 48 hours. Throw away any unused medicine after the expiration date. NOTE: This sheet is a summary. It may not cover all possible information. If you have questions about this medicine, talk to your doctor, pharmacist, or health care provider.    2016, Elsevier/Gold Standard. (2014-01-07 14:30:14)  

## 2015-05-02 ENCOUNTER — Encounter: Payer: Self-pay | Admitting: Oncology

## 2015-05-02 ENCOUNTER — Encounter (HOSPITAL_BASED_OUTPATIENT_CLINIC_OR_DEPARTMENT_OTHER): Payer: Self-pay | Admitting: *Deleted

## 2015-05-02 NOTE — Progress Notes (Signed)
Medical Oncology  Reviewed timing of Eliquis around surgical procedure with [facility] pharmacist. Will hold x 48 hrs prior, then resume night of surgery if ok then.  Surgery on 05-12-15, planned outpatient. Hold bid Eliquis 5-9 and 5-10 and AM of surgery. Resume with PM dose on 5-11 after surgery if stable.  Godfrey Pick, MD

## 2015-05-03 ENCOUNTER — Other Ambulatory Visit: Payer: Self-pay | Admitting: Gynecologic Oncology

## 2015-05-03 ENCOUNTER — Telehealth: Payer: Self-pay | Admitting: Gynecologic Oncology

## 2015-05-03 DIAGNOSIS — C541 Malignant neoplasm of endometrium: Secondary | ICD-10-CM

## 2015-05-03 DIAGNOSIS — I898 Other specified noninfective disorders of lymphatic vessels and lymph nodes: Secondary | ICD-10-CM

## 2015-05-03 DIAGNOSIS — R1909 Other intra-abdominal and pelvic swelling, mass and lump: Secondary | ICD-10-CM

## 2015-05-03 NOTE — Telephone Encounter (Signed)
Called patient to inform her of recommendations for holding her Eliquis per Dr. Marko Plume.  Patient sounding lethargic on the phone but stating she is ok and stating she does not need the instructions mailed to her house.  Stating she wrote the directions down.  Lennis Marion Downer, MD  Dorothyann Gibbs, NP Cc: Baruch Merl, RN           Hold Eliquis for 48 hrs prior to surgery, so no Eliquis 5-9, 5-10 or AM of surgery on 5-11  Resume night of surgery if ok then

## 2015-05-04 ENCOUNTER — Telehealth: Payer: Self-pay | Admitting: Gynecologic Oncology

## 2015-05-04 NOTE — Telephone Encounter (Signed)
Spoke with Pura Spice about patient needing home health services.  Negative pressure order form to be faxed to our office.

## 2015-05-04 NOTE — Telephone Encounter (Signed)
Left message with Pura Spice about patient needing home health services along with negative pressure wound therapy post-op.

## 2015-05-08 ENCOUNTER — Other Ambulatory Visit: Payer: Self-pay | Admitting: Oncology

## 2015-05-09 ENCOUNTER — Encounter: Payer: Self-pay | Admitting: Oncology

## 2015-05-09 ENCOUNTER — Encounter: Payer: Self-pay | Admitting: Genetic Counselor

## 2015-05-09 ENCOUNTER — Other Ambulatory Visit: Payer: Self-pay

## 2015-05-09 ENCOUNTER — Ambulatory Visit: Payer: Self-pay | Admitting: Genetic Counselor

## 2015-05-09 ENCOUNTER — Ambulatory Visit (HOSPITAL_BASED_OUTPATIENT_CLINIC_OR_DEPARTMENT_OTHER): Payer: Self-pay | Admitting: Oncology

## 2015-05-09 ENCOUNTER — Other Ambulatory Visit (HOSPITAL_BASED_OUTPATIENT_CLINIC_OR_DEPARTMENT_OTHER): Payer: Self-pay

## 2015-05-09 ENCOUNTER — Telehealth: Payer: Self-pay | Admitting: Oncology

## 2015-05-09 VITALS — BP 137/70 | HR 84 | Temp 100.0°F | Resp 18 | Ht 62.0 in | Wt 172.3 lb

## 2015-05-09 DIAGNOSIS — C569 Malignant neoplasm of unspecified ovary: Secondary | ICD-10-CM

## 2015-05-09 DIAGNOSIS — R112 Nausea with vomiting, unspecified: Secondary | ICD-10-CM

## 2015-05-09 DIAGNOSIS — R1909 Other intra-abdominal and pelvic swelling, mass and lump: Secondary | ICD-10-CM

## 2015-05-09 DIAGNOSIS — C7800 Secondary malignant neoplasm of unspecified lung: Secondary | ICD-10-CM

## 2015-05-09 DIAGNOSIS — Z803 Family history of malignant neoplasm of breast: Secondary | ICD-10-CM | POA: Insufficient documentation

## 2015-05-09 DIAGNOSIS — C778 Secondary and unspecified malignant neoplasm of lymph nodes of multiple regions: Secondary | ICD-10-CM

## 2015-05-09 DIAGNOSIS — C55 Malignant neoplasm of uterus, part unspecified: Secondary | ICD-10-CM

## 2015-05-09 DIAGNOSIS — D126 Benign neoplasm of colon, unspecified: Secondary | ICD-10-CM

## 2015-05-09 DIAGNOSIS — G620 Drug-induced polyneuropathy: Secondary | ICD-10-CM

## 2015-05-09 DIAGNOSIS — T451X5A Adverse effect of antineoplastic and immunosuppressive drugs, initial encounter: Secondary | ICD-10-CM

## 2015-05-09 DIAGNOSIS — I82402 Acute embolism and thrombosis of unspecified deep veins of left lower extremity: Secondary | ICD-10-CM

## 2015-05-09 DIAGNOSIS — C801 Malignant (primary) neoplasm, unspecified: Secondary | ICD-10-CM

## 2015-05-09 DIAGNOSIS — D500 Iron deficiency anemia secondary to blood loss (chronic): Secondary | ICD-10-CM

## 2015-05-09 DIAGNOSIS — T50905D Adverse effect of unspecified drugs, medicaments and biological substances, subsequent encounter: Secondary | ICD-10-CM

## 2015-05-09 DIAGNOSIS — Z7901 Long term (current) use of anticoagulants: Secondary | ICD-10-CM

## 2015-05-09 DIAGNOSIS — I10 Essential (primary) hypertension: Secondary | ICD-10-CM

## 2015-05-09 LAB — CBC WITH DIFFERENTIAL/PLATELET
BASO%: 0.3 % (ref 0.0–2.0)
BASOS ABS: 0.1 10*3/uL (ref 0.0–0.1)
EOS ABS: 0.1 10*3/uL (ref 0.0–0.5)
EOS%: 0.6 % (ref 0.0–7.0)
HCT: 28.1 % — ABNORMAL LOW (ref 34.8–46.6)
HEMOGLOBIN: 8.9 g/dL — AB (ref 11.6–15.9)
LYMPH#: 1.6 10*3/uL (ref 0.9–3.3)
LYMPH%: 8.6 % — ABNORMAL LOW (ref 14.0–49.7)
MCH: 26.5 pg (ref 25.1–34.0)
MCHC: 31.8 g/dL (ref 31.5–36.0)
MCV: 83.4 fL (ref 79.5–101.0)
MONO#: 0.8 10*3/uL (ref 0.1–0.9)
MONO%: 4.2 % (ref 0.0–14.0)
NEUT%: 86.3 % — ABNORMAL HIGH (ref 38.4–76.8)
NEUTROS ABS: 16.3 10*3/uL — AB (ref 1.5–6.5)
Platelets: 316 10*3/uL (ref 145–400)
RBC: 3.37 10*6/uL — ABNORMAL LOW (ref 3.70–5.45)
RDW: 26.8 % — ABNORMAL HIGH (ref 11.2–14.5)
WBC: 18.9 10*3/uL — AB (ref 3.9–10.3)

## 2015-05-09 LAB — COMPREHENSIVE METABOLIC PANEL
ALK PHOS: 156 U/L — AB (ref 40–150)
ALT: <9 U/L (ref 0–55)
AST: 8 U/L (ref 5–34)
Albumin: 3.3 g/dL — ABNORMAL LOW (ref 3.5–5.0)
Anion Gap: 9 mEq/L (ref 3–11)
BILIRUBIN TOTAL: 0.4 mg/dL (ref 0.20–1.20)
BUN: 8.8 mg/dL (ref 7.0–26.0)
CALCIUM: 9.6 mg/dL (ref 8.4–10.4)
CO2: 28 mEq/L (ref 22–29)
Chloride: 103 mEq/L (ref 98–109)
Creatinine: 1.2 mg/dL — ABNORMAL HIGH (ref 0.6–1.1)
EGFR: 58 mL/min/{1.73_m2} — AB (ref >90)
GLUCOSE: 112 mg/dL (ref 70–140)
Potassium: 3.3 mEq/L — ABNORMAL LOW (ref 3.5–5.1)
SODIUM: 140 meq/L (ref 136–145)
TOTAL PROTEIN: 6.9 g/dL (ref 6.4–8.3)

## 2015-05-09 MED ORDER — MORPHINE SULFATE 15 MG PO TABS: 15.0000 mg | ORAL_TABLET | ORAL | Status: DC | PRN

## 2015-05-09 NOTE — Telephone Encounter (Signed)
appt made and avs printed °

## 2015-05-09 NOTE — Progress Notes (Signed)
OFFICE PROGRESS NOTE   May 10, 2015   Physicians:E.Denman George, J.Kinard, Verl Blalock, [place] [facility]) New patient referral made to [facility]ness for PCP, not established yet. Dr Frederich Cha  INTERVAL HISTORY:  Patient is seen, together with daughter, in continuing attention to extensively metastatic gyn carcinoma (lungs, extensive adenopathy chest/ abdomen/ pelvis including right inguinal nodal mass, and left psoas into L4-5 vertebral bodies), clinically improving on chemotherapy since 02-24-15.  She had cycle 4 taxotere only on 04-28-15 with neulasta 04-30-15.  She had skin test reaction to carboplatin prior to cycle 3, such that [place] was not used cycle 3 or 4 (discontinued).  Will delay cycle 5 x 1 week due to upcoming surgical procedure.  Patient saw Dr Rolanda Jay in consultation on 04-26-15, plan for open drainage of the recurrent inguinal fluid collection on 05-12-15, with mobilization of sartorius muscle to cover femoral vessels and wound vac. She is to hold Eliquis x 48 hrs prior (no Eliquis 5-9, 5-10 or AM 5-11, then resume PM 5-11 if stable per Dr Rolanda Jay).   Patient again had a full week of severe aches after taxotere and neulasta, slightly better with claritin, pain medication (MSIR) not helpful for these aches. She has started to feel better again today. 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 Appetite and energy are much better overall. The right inguinal fluid collection is still bothersome, tho not quite as tight this week. She needs MSIR 15 mg ~ one dose every other day for back pain in area of known metastatic involvement (improved). She has no new or different pain otherwise. Bowels are moving regularly. No bladder symptoms. No fever or symptoms of infection.  Remainder of 10 point Review of Systems negative/  unchanged.    She continues Eliquis for LLE DVT 2-central catheter Genetics counseling accomplished earlier today, testing to be done after medicaid obtained per patient's request.  Flu vaccine 02-21-15 CA 125 on 02-14-15 642.9 Feraheme 02-16-15 and [id number].  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 She was lost to follow up for the gyn cancer after 11-2010, until presented with recurrent disease. Patient reported right inguinal mass for ~ 2 months, then LLE pain for ~ 3 weeks when she presented to ED on 02-12-15. CT AP showed nodules in lung bases suspicious for metastatic disease, liver not remarkable, no hydronephrosis, 6 mm right renal calculus, thrombus left external iliac vein into left common femoral vein, retroperitoneal and pelvic adenopathy, an 8.5 x 7.1 cm necrotic right inguinal node, post hyst ooph, no ascites, likely tumor involvement in left psoas with adjacent involvement of L4 and L5 vertebral bodies; CT chest subsequently had multiple bilateral pulmonary nodules and central adenopathy. Patient was begun on heparin qtt which was transitioned to Eliquis (on pharmaceutical assistance program). CA 125 from 02-14-15 was 642.9, this having been 3.6 in 08-2010. Pathology from right inguinal nodal mass aspiration and core needle biopsy 02-15-15 necrotic material with metastatic adenocarcinoma with immunostains consistent with gyn primary ([id number], ER+). She was transfused 2 units PRBCs for hgb 6.7. Iron studies 2-12 with serum iron 14 and %sat 7, given feraheme x2. Dr  Denman George saw in consultation prior to starting carboplatin taxotere on  02-24-15.   Objective:  Vital signs in last 24 hours:  BP 137/70 mmHg  Pulse 84  Temp(Src) 100 F (37.8 C)  Resp 18  Ht 5' 2"  (1.575 m)  Wt 172 lb 4.8 oz (78.155 kg)  BMI 31.51 kg/m2  weight stable Alert, oriented and appropriate. Ambulatory without difficulty, looks comfortable seated in exam room. Wearing heels with pointed toes. Respirations not labored RA Alopecia  HEENT:PERRL, sclerae not icteric. Oral mucosa moist without lesions, posterior pharynx clear.  Neck supple. No JVD.  Lymphatics:no supraclavicular adenopathy. Right inguinal mass not quite as tight, no skin breakdown, not tender or hot. Resp: clear to auscultation bilaterally Cardio: regular rate and rhythm. No gallop. GI: soft, nontender, not distended, no mass or organomegaly. Normally active bowel sounds. Surgical incision not remarkable. Musculoskeletal/ Extremities: LE without pitting edema, cords, tenderness Neuro: no peripheral neuropathy. Otherwise nonfocal Skin without rash, ecchymosis, petechiae. Mucous membranes somewhat pale.   Lab Results:  Results for orders placed or performed in visit on 05/09/15  CBC with Differential  Result Value Ref Range   WBC 18.9 (H) 3.9 - 10.3 10e3/uL   NEUT# 16.3 (H) 1.5 - 6.5 10e3/uL   HGB 8.9 (L) 11.6 - 15.9 g/dL   HCT 28.1 (L) 34.8 - 46.6 %   Platelets 316 145 - 400 10e3/uL   MCV 83.4 79.5 - 101.0 fL   MCH 26.5 25.1 - 34.0 pg   MCHC 31.8 31.5 - 36.0 g/dL   RBC 3.37 (L) 3.70 - 5.45 10e6/uL   RDW 26.8 (H) 11.2 - 14.5 %   lymph# 1.6 0.9 - 3.3 10e3/uL   MONO# 0.8 0.1 - 0.9 10e3/uL   Eosinophils Absolute 0.1 0.0 - 0.5 10e3/uL   Basophils Absolute 0.1 0.0 - 0.1 10e3/uL   NEUT% 86.3 (H) 38.4 - 76.8 %   LYMPH% 8.6 (L) 14.0 - 49.7 %   MONO% 4.2 0.0 - 14.0 %   EOS% 0.6 0.0 - 7.0 %   BASO% 0.3 0.0 - 2.0 %  Comprehensive metabolic panel  Result Value Ref Range   Sodium 140 136 - 145 mEq/L   Potassium 3.3 (L) 3.5 - 5.1 mEq/L   Chloride 103 98 - 109 mEq/L   CO2 28  22 - 29 mEq/L   Glucose 112 70 - 140 mg/dl   BUN 8.8 7.0 - 26.0 mg/dL   Creatinine 1.2 (H) 0.6 - 1.1 mg/dL   Total Bilirubin 0.40 0.20 - 1.20 mg/dL   Alkaline Phosphatase 156 (H) 40 - 150 U/L   AST 8 5 - 34 U/L   ALT <9 0 - 55 U/L   Total Protein 6.9 6.4 - 8.3 g/dL   Albumin 3.3 (L) 3.5 - 5.0 g/dL   Calcium 9.6 8.4 - 10.4 mg/dL   Anion Gap 9 3 - 11 mEq/L   EGFR 58 (L) >90 ml/min/1.73 m2   CA 125 on 04-21-15 was 111, this having been 427 on 03-14-15 and 642 on 02-14-15.  Studies/Results:  No results found.  Medications: I have reviewed the patient's current medications. Continue claritin beginning day of each chemo thru resolution of aches. Eliquis: hold 5-9, 5-10 and AM of surgery 5-11, then resume PM of surgery 5-11 if stable per Dr Rolanda Jay .  (Per pharmacy, delayed action of Eliquis so LMW heparin coverage will not be added those days)  DISCUSSION Mecs as above. Encouraged her to walk and move LE intentionally during the time  that Eliquis is held .Clinically improving, willing to continue taxotere / neulasta even with severity of aches for now. Understands carboplatin will not be used due to skin test reaction.  She was comfortable and pleased with Dr Suzzanne Cloud visit and in agreement with plans for surgical procedure later this week.   I have explained again that genetics information may allow other treatment options for her.   Assessment/Plan:  1.metastatic adenocarcinoma of gyn primary, in patient with history of IC clear cell ovarian and IA high grade endometrial carcinomas 03-2008 (incomplete staging), post TAH BSO, adjuvant carboplatin taxol x 6 cycles and radiation,  with significant residual taxol neuropathy in feet. Present metastatic disease involves large necrotic right inguinal node, paraaortic/ aortocaval/ external iliac nodes, and multiple bilateral pulmonary mets. PS and CA 125 improving with chemo thus far. Has not had zometa. I will see her 05-23-15 prior to  cycle 5  taxotere on 05-26-15 if stable, this treatment delayed one week due to upcoming surgical procedure. Decadron x 3 days around taxotere, aloxi, neulasta Genetics counseling done, testing recommended as this may give option for treatment with PARP inhibitor. Patient prefers to wait for testing until Medicaid hopefully obtained.  2.LLE DVT documented 02-12-15: on Eliquis, no longer symptomatic, no bleeding. No IVC filter. Eliquis approved thru [employer] assistance x 1 year. Hold as above around surgical procedure this week. 3.Increase in right inguinal mass (fluid filled necrotic malignant nodal mass/ lymphocyst)  within 2 days of aspiration of 180 cc on 04-12-15. Surgical management of area  planned 05-12-15 - thank you!  4.severe iron deficiency anemia: etiology not clear. UA was negative for blood, FOB not resulted in EMR. Feraheme x 2 doses done. Hemoglobin stable at 8.9, follow 5. chemo nausea and vomiting improved with aloxi and protonix, continue 6.uncontrolled HTN initially: on labetalol 200 mg tid per urgent care + clonidine 0.1 mg tid and HCTZ. BP much better now. Still needs PCP to manage this, or if more problems prior will try to get her to cardiology 7.po intake improved, nausea better. [place] nutritionist involved. 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 Stable or slightly progressive, however benefit from taxotere seems appropriate to continue. 11.Social:  medicaid still pending, Energy manager involved. 12.difficult IV access not easy tho still manageable. Would need to be off Eliquis x 48 hrs for PAC by IR. 13.overdue mammograms, general medical care, colonoscopy. Noncompliance with follow up after cancer diagnoses and adjuvant treatment after ~ 2012. Noncompliant with care for HTN over past several years, despite family efforts ( daughter is pediatrician) Needs PCP  but has not followed thru with referral to [facility] clinic. 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 Repeat colonoscopy recommended 10-2009, apparently not done. Note iron deficiency. FOB not yet in EMR. 15.carboplatin skin test reaction cycle 3: that drug discontinued.  All questions answered and patient is in agreement with recommendations and plans. Chemo and neulasta orders placed. Time spent 25 min including >50% counseling and coordination of care.   LIVESAY,LENNIS P, MD   05/10/2015, 9:17 AM

## 2015-05-09 NOTE — Progress Notes (Signed)
REFERRING PROVIDER: Evlyn Clines, MD  PRIMARY PROVIDER:  No PCP Per Patient  PRIMARY REASON FOR VISIT:  1. International Federation of Gynecology and Obstetrics (FIGO) stage IVB epithelial ovarian cancer ([place])   2. Malignant neoplasm of uterus, unspecified site ([place])   3. Family history of breast cancer      HISTORY OF PRESENT ILLNESS:   [name], a 60 y.o. female, was seen for a [place] cancer genetics consultation at the request of Dr. Marko Plume due to a personal and family history of cancer.  Ms. Demo presents to clinic today to discuss the possibility of a hereditary predisposition to cancer, genetic testing, and to further clarify her future cancer risks, as well as potential cancer risks for family members.   In 2010, at the age of 52, [name] was diagnosed with uterine and ovarian cancer. This was treated with surgery and chemotherapy.     CANCER HISTORY:   No history exists.     HORMONAL RISK FACTORS:  Menarche was at age 64.  First live birth at age 16.  OCP use for approximately 2 years.  Ovaries intact: no.  Hysterectomy: yes.  Menopausal status: postmenopausal.  HRT use: 0 years. Colonoscopy: yes; 1 polyps in 2010, repeat in 10 years. Mammogram within the last year: no. Number of breast biopsies: 0. Up to date with pelvic exams:  yes. Any excessive radiation exposure in the past:  no  Past Medical History  Diagnosis Date  . History of radiation therapy 10/21/08-11/23/08 & 12/07/10/13/12/23 2010    ENDOMETRIOID   . Hypertension   . DVT, lower extremity ([place])     left  02-12-2015  currently on Eliqius  . Iron deficiency anemia     chronic severe  . Chemotherapy-induced neuropathy ([place])   . History of adenomatous polyp of colon     tubulovillious adenoma 09/ 2010  . Epithelial ovarian cancer, FIGO stage IVB ([place])   . Endometrial cancer [facility]) oncologist-  dr Marko Plume  . Metastasis to lung ([place])   . Metastasis to lymph nodes ([place])   .  Nephrolithiasis     right non-obstrucitve per ct  . Inguinal fluid collection     right  . History of ovarian cyst     complex  . History of uterine fibroid   . Renal cyst, right   . Family history of breast cancer     Past Surgical History  Procedure Laterality Date  . Tubal ligation    . Colonoscopy w/ polypectomy  09-27-2008  . Exploratory laparotomy /  total abdominal hysterectomy/ bilateral salpinoophorectomy/  partial omentectomy  03-18-2008  . Umbilical hernia repair      Social History   Social History  . Marital Status: Married    Spouse Name: N/A  . Number of Children: 3  . Years of Education: N/A   Social History Main Topics  . Smoking status: Never Smoker   . Smokeless tobacco: Never Used  . Alcohol Use: No  . Drug Use: No  . Sexual Activity: Not Asked   Other Topics Concern  . None   Social History Narrative     FAMILY HISTORY:  We obtained a detailed, 4-generation family history.  Significant diagnoses are listed below: Family History  Problem Relation Age of Onset  . Bone cancer Paternal Grandmother     OSSEOUS METASTASIS  . Heart disease Father   . Heart disease Sister   . Bone cancer Maternal Grandmother 75  . Leukemia Maternal Grandfather 7  .  Liver cancer Maternal Uncle   . Breast cancer Cousin 31    maternal first cousin    The patient has three daughters who are cancer free.  She had two sisters, one who disappeared and is presumed dead, about 30 years ago, and another who died at 68 from heart disease.  The patient's parents are deceased.  Her mother died at 95 and her father at 74 from heart disease.  Her father was an only child and there is no other reported family history of cancer on his side.  Her mother had one sister and two brothers.  One brother had liver cancer diagnosed in his 82s.  The sister had a daughter who was diagnosed with breast cancer at 57. The patient's maternal grandparents are deceased.  Her grandmother had bone  cancer at 70 and her grandfather had leukemia at 1.  atient's maternal ancestors are of African American descent, and paternal ancestors are of African American descent. There is no reported Ashkenazi Jewish ancestry. There is no known consanguinity.  GENETIC COUNSELING ASSESSMENT: [name] is a 60 y.o. female with a personal and family history of cancer which is somewhat suggestive of a hereditary cancer syndrome and predisposition to cancer. We, therefore, discussed and recommended the following at today's visit.   DISCUSSION: We discussed that about 5-10% of all cancer is hereditary.  When an indidivudal is diagnosed with both uterine and ovarian cancer the concern is primarily for Lynch syndrome.  WE reviewed Lynch syndrome and the cancer involved with this condition.  We also reviewed BRCA mutations as this is the most common cause of ovairan cancer.  We reviewed the characteristics, features and inheritance patterns of hereditary cancer syndromes. We also discussed genetic testing, including the appropriate family members to test, the process of testing, insurance coverage and turn-around-time for results. We discussed the implications of a negative, positive and/or variant of uncertain significant result. Ms. Wentling is currently without health insurance.  We discussed that [facility] will test patients for free as long as they meet the financial criteria.  Additionally, we could wait until she obtains Medicaid.  Ms. Bevard prefers to wait. She states that she anticipates that she will get Medicaid in the next 12 days.  We recommended Ms. Fillingim pursue genetic testing for a custom panel which involves the genes from the Breast/Ovarian cancer panel and the GYN gene panel.   Based on Ms. Gosney's personal and family history of cancer, she meets medical criteria for genetic testing. Despite that she meets criteria, she may still have an out of pocket cost. We discussed that if her out of  pocket cost for testing is over $100, the laboratory will call and confirm whether she wants to proceed with testing.  If the out of pocket cost of testing is less than $100 she will be billed by the genetic testing laboratory.    PLAN: Despite our recommendation, Ms. Dobos did not wish to pursue genetic testing at today's visit. We understand this decision, and remain available to coordinate genetic testing at any time in the future. Ms. Pointer would like to wait until she obtains her Medicaid card and will pursue genetic testing at that time.  She will call us when this occurs.  We therefore, recommend Ms. Dekay continue to follow the cancer screening guidelines given by her primary healthcare provider.  Lastly, we encouraged Ms. Zaino to remain in contact with cancer genetics annually so that we can continuously update the family  history and inform her of any changes in cancer genetics and testing that may be of benefit for this family.   Ms.  Streed questions were answered to her satisfaction today. Our contact information was provided should additional questions or concerns arise. Thank you for the referral and allowing [place] to share in the care of your patient.   Abygale Karpf P. Florene Glen, [place], [facility] Certified Genetic Counselor [email or web address] phone: [phone]  The patient was seen for a total of 60 minutes in face-to-face genetic counseling.  This patient was discussed with Drs. Magrinat, Lindi Adie and/or Burr Medico who agrees with the above.    _______________________________________________________________________ For Office Staff:  Number of people involved in session: 2 Was an Intern/ student involved with case: yes: Elouise Munroe

## 2015-05-10 ENCOUNTER — Encounter (HOSPITAL_BASED_OUTPATIENT_CLINIC_OR_DEPARTMENT_OTHER): Payer: Self-pay | Admitting: *Deleted

## 2015-05-10 ENCOUNTER — Telehealth: Payer: Self-pay

## 2015-05-10 DIAGNOSIS — I82402 Acute embolism and thrombosis of unspecified deep veins of left lower extremity: Secondary | ICD-10-CM

## 2015-05-10 DIAGNOSIS — C541 Malignant neoplasm of endometrium: Secondary | ICD-10-CM

## 2015-05-10 DIAGNOSIS — R1909 Other intra-abdominal and pelvic swelling, mass and lump: Secondary | ICD-10-CM | POA: Insufficient documentation

## 2015-05-10 DIAGNOSIS — T50905A Adverse effect of unspecified drugs, medicaments and biological substances, initial encounter: Secondary | ICD-10-CM | POA: Insufficient documentation

## 2015-05-10 DIAGNOSIS — D126 Benign neoplasm of colon, unspecified: Secondary | ICD-10-CM | POA: Insufficient documentation

## 2015-05-10 MED ORDER — DEXAMETHASONE 4 MG PO TABS: 8.0000 mg | ORAL_TABLET | Freq: Two times a day (BID) | ORAL | Status: DC

## 2015-05-10 NOTE — Telephone Encounter (Signed)
S/w pt that Rx for decadron was sent.

## 2015-05-10 NOTE — Progress Notes (Addendum)
To [facility] [place] with chart-Ekg,T&S on arrival-Instructed Npo after Mn-will take labetalol,protonix with water in am.instructed to bring any supplies regarding wound vac if delivered  to her home.

## 2015-05-10 NOTE — Telephone Encounter (Signed)
-----   Message from Gordy Levan, MD sent at 05/10/2015  6:58 AM EDT ----- Labs seen and need follow up:  Potassium is a little low again. If she has not been taking 10 mEq daily she needs to resume that; if she has been taking 10 mEq daily she needs to increase to 20 mEq = 2 tabs daily

## 2015-05-10 NOTE — Telephone Encounter (Signed)
lvm per Dr LL attached note. 

## 2015-05-10 NOTE — Telephone Encounter (Signed)
-----   Message from Gordy Levan, MD sent at 05/09/2015  3:50 PM EDT ----- Ok to refill decadron enough for 1 RX with 1 RF  thanks

## 2015-05-11 ENCOUNTER — Telehealth: Payer: Self-pay | Admitting: Gynecologic Oncology

## 2015-05-11 ENCOUNTER — Telehealth: Payer: Self-pay | Admitting: Oncology

## 2015-05-11 NOTE — Telephone Encounter (Signed)
Faxed pt medical records to [facility] Disability Determination [id number]

## 2015-05-11 NOTE — Telephone Encounter (Signed)
Left message with Pura Spice to confirm that everything (negative pressure wound therapy) is ready for the patient tomorrow for surgery.

## 2015-05-12 ENCOUNTER — Ambulatory Visit (HOSPITAL_BASED_OUTPATIENT_CLINIC_OR_DEPARTMENT_OTHER): Payer: Medicaid Other | Admitting: Anesthesiology

## 2015-05-12 ENCOUNTER — Encounter (HOSPITAL_BASED_OUTPATIENT_CLINIC_OR_DEPARTMENT_OTHER): Payer: Self-pay | Admitting: Anesthesiology

## 2015-05-12 ENCOUNTER — Other Ambulatory Visit: Payer: Self-pay

## 2015-05-12 ENCOUNTER — Encounter (HOSPITAL_BASED_OUTPATIENT_CLINIC_OR_DEPARTMENT_OTHER)
Admission: RE | Disposition: A | Discharge status: Home / Self Care | Payer: Self-pay | Source: Ambulatory Visit | Attending: General Surgery

## 2015-05-12 ENCOUNTER — Ambulatory Visit (HOSPITAL_BASED_OUTPATIENT_CLINIC_OR_DEPARTMENT_OTHER)
Admission: RE | Admit: 2015-05-12 | Discharge: 2015-05-12 | Disposition: A | Discharge status: Home / Self Care | Payer: Medicaid Other | Source: Ambulatory Visit | Attending: General Surgery | Admitting: General Surgery

## 2015-05-12 DIAGNOSIS — I10 Essential (primary) hypertension: Secondary | ICD-10-CM | POA: Diagnosis not present

## 2015-05-12 DIAGNOSIS — C495 Malignant neoplasm of connective and soft tissue of pelvis: Secondary | ICD-10-CM | POA: Insufficient documentation

## 2015-05-12 DIAGNOSIS — D649 Anemia, unspecified: Secondary | ICD-10-CM | POA: Insufficient documentation

## 2015-05-12 DIAGNOSIS — C7801 Secondary malignant neoplasm of right lung: Secondary | ICD-10-CM | POA: Diagnosis not present

## 2015-05-12 DIAGNOSIS — C7802 Secondary malignant neoplasm of left lung: Secondary | ICD-10-CM | POA: Diagnosis not present

## 2015-05-12 DIAGNOSIS — R609 Edema, unspecified: Secondary | ICD-10-CM | POA: Diagnosis present

## 2015-05-12 DIAGNOSIS — Z7901 Long term (current) use of anticoagulants: Secondary | ICD-10-CM | POA: Diagnosis not present

## 2015-05-12 DIAGNOSIS — I898 Other specified noninfective disorders of lymphatic vessels and lymph nodes: Secondary | ICD-10-CM

## 2015-05-12 DIAGNOSIS — Z8542 Personal history of malignant neoplasm of other parts of uterus: Secondary | ICD-10-CM | POA: Diagnosis not present

## 2015-05-12 DIAGNOSIS — I739 Peripheral vascular disease, unspecified: Secondary | ICD-10-CM | POA: Insufficient documentation

## 2015-05-12 DIAGNOSIS — R1909 Other intra-abdominal and pelvic swelling, mass and lump: Secondary | ICD-10-CM | POA: Insufficient documentation

## 2015-05-12 DIAGNOSIS — Z86718 Personal history of other venous thrombosis and embolism: Secondary | ICD-10-CM | POA: Diagnosis not present

## 2015-05-12 DIAGNOSIS — Z79899 Other long term (current) drug therapy: Secondary | ICD-10-CM | POA: Insufficient documentation

## 2015-05-12 DIAGNOSIS — Z923 Personal history of irradiation: Secondary | ICD-10-CM | POA: Diagnosis not present

## 2015-05-12 DIAGNOSIS — C541 Malignant neoplasm of endometrium: Secondary | ICD-10-CM

## 2015-05-12 HISTORY — DX: Personal history of colonic polyps: Z86.010

## 2015-05-12 HISTORY — DX: Personal history of adenomatous and serrated colon polyps: Z86.0101

## 2015-05-12 HISTORY — DX: Adverse effect of antineoplastic and immunosuppressive drugs, initial encounter: T45.1X5A

## 2015-05-12 HISTORY — DX: Iron deficiency anemia, unspecified: D50.9

## 2015-05-12 HISTORY — PX: APPLICATION OF WOUND VAC: SHX5189

## 2015-05-12 HISTORY — DX: Secondary and unspecified malignant neoplasm of lymph node, unspecified: C77.9

## 2015-05-12 HISTORY — DX: Personal history of other benign neoplasm: Z86.018

## 2015-05-12 HISTORY — DX: Personal history of other diseases of the female genital tract: Z87.42

## 2015-05-12 HISTORY — PX: INGUINAL HERNIA REPAIR: SHX194

## 2015-05-12 HISTORY — DX: Secondary malignant neoplasm of unspecified lung: C78.00

## 2015-05-12 HISTORY — DX: Adverse effect of antineoplastic and immunosuppressive drugs, initial encounter: G62.0

## 2015-05-12 HISTORY — DX: Acute embolism and thrombosis of unspecified deep veins of unspecified lower extremity: I82.409

## 2015-05-12 HISTORY — DX: Malignant neoplasm of endometrium: C54.1

## 2015-05-12 HISTORY — DX: Malignant neoplasm of unspecified ovary: C56.9

## 2015-05-12 HISTORY — DX: Other ascites: R18.8

## 2015-05-12 LAB — TYPE AND SCREEN
ABO/RH(D): O POS
ANTIBODY SCREEN: NEGATIVE

## 2015-05-12 SURGERY — REPAIR, HERNIA, INGUINAL, INCARCERATED
Anesthesia: General | Site: Inguinal | Laterality: Right

## 2015-05-12 MED ORDER — SODIUM CHLORIDE 0.9 % IR SOLN
Status: DC | PRN
  Administered 2015-05-12: 500 mL

## 2015-05-12 MED ORDER — CEFAZOLIN SODIUM-DEXTROSE 2-4 GM/100ML-% IV SOLN
2.0000 g | INTRAVENOUS | Status: AC
  Administered 2015-05-12: 2 g via INTRAVENOUS
  Filled 2015-05-12: qty 100

## 2015-05-12 MED ORDER — MIDAZOLAM HCL 2 MG/2ML IJ SOLN
INTRAMUSCULAR | Status: AC
  Filled 2015-05-12: qty 2

## 2015-05-12 MED ORDER — OXYCODONE HCL 5 MG PO TABS
ORAL_TABLET | ORAL | Status: AC
  Filled 2015-05-12: qty 1

## 2015-05-12 MED ORDER — BUPIVACAINE-EPINEPHRINE 0.25% -1:200000 IJ SOLN
INTRAMUSCULAR | Status: DC | PRN
  Administered 2015-05-12: 9 mL

## 2015-05-12 MED ORDER — DEXAMETHASONE SODIUM PHOSPHATE 10 MG/ML IJ SOLN
INTRAMUSCULAR | Status: AC
  Filled 2015-05-12: qty 1

## 2015-05-12 MED ORDER — ONDANSETRON HCL 4 MG/2ML IJ SOLN
INTRAMUSCULAR | Status: AC
  Filled 2015-05-12: qty 2

## 2015-05-12 MED ORDER — ONDANSETRON HCL 4 MG/2ML IJ SOLN
4.0000 mg | Freq: Once | INTRAMUSCULAR | Status: DC | PRN
  Filled 2015-05-12: qty 2

## 2015-05-12 MED ORDER — LACTATED RINGERS IV SOLN: 50.0000 mL | INTRAVENOUS | Status: DC

## 2015-05-12 MED ORDER — ONDANSETRON HCL 4 MG/2ML IJ SOLN
INTRAMUSCULAR | Status: DC | PRN
  Administered 2015-05-12: 4 mg via INTRAVENOUS

## 2015-05-12 MED ORDER — FENTANYL CITRATE (PF) 100 MCG/2ML IJ SOLN
INTRAMUSCULAR | Status: AC
  Filled 2015-05-12: qty 2

## 2015-05-12 MED ORDER — PROPOFOL 10 MG/ML IV BOLUS
INTRAVENOUS | Status: AC
  Filled 2015-05-12: qty 20

## 2015-05-12 MED ORDER — MIDAZOLAM HCL 5 MG/5ML IJ SOLN
INTRAMUSCULAR | Status: DC | PRN
  Administered 2015-05-12: 0.5 mg via INTRAVENOUS

## 2015-05-12 MED ORDER — DEXAMETHASONE SODIUM PHOSPHATE 4 MG/ML IJ SOLN
INTRAMUSCULAR | Status: DC | PRN
  Administered 2015-05-12: 10 mg via INTRAVENOUS

## 2015-05-12 MED ORDER — OXYCODONE HCL 5 MG PO TABS
5.0000 mg | ORAL_TABLET | Freq: Once | ORAL | Status: AC
  Administered 2015-05-12: 5 mg via ORAL
  Filled 2015-05-12: qty 1

## 2015-05-12 MED ORDER — LIDOCAINE HCL (CARDIAC) 20 MG/ML IV SOLN
INTRAVENOUS | Status: DC | PRN
  Administered 2015-05-12: 60 mg via INTRAVENOUS

## 2015-05-12 MED ORDER — HYDROMORPHONE HCL 1 MG/ML IJ SOLN
0.5000 mg | INTRAMUSCULAR | Status: DC | PRN
  Administered 2015-05-12: 0.5 mg via INTRAVENOUS
  Filled 2015-05-12: qty 1

## 2015-05-12 MED ORDER — PROPOFOL 10 MG/ML IV BOLUS
INTRAVENOUS | Status: DC | PRN
  Administered 2015-05-12: 200 mg via INTRAVENOUS

## 2015-05-12 MED ORDER — LIDOCAINE HCL (CARDIAC) 20 MG/ML IV SOLN
INTRAVENOUS | Status: AC
Start: 2015-05-12 — End: 2015-05-12
  Filled 2015-05-12: qty 5

## 2015-05-12 MED ORDER — HYDROMORPHONE HCL 1 MG/ML IJ SOLN
INTRAMUSCULAR | Status: AC
  Filled 2015-05-12: qty 1

## 2015-05-12 MED ORDER — CEFAZOLIN SODIUM-DEXTROSE 2-4 GM/100ML-% IV SOLN
INTRAVENOUS | Status: AC
  Filled 2015-05-12: qty 100

## 2015-05-12 MED ORDER — LACTATED RINGERS IV SOLN
INTRAVENOUS | Status: DC
  Administered 2015-05-12: 08:00:00 via INTRAVENOUS
  Filled 2015-05-12: qty 1000

## 2015-05-12 MED ORDER — FENTANYL CITRATE (PF) 100 MCG/2ML IJ SOLN
INTRAMUSCULAR | Status: DC | PRN
  Administered 2015-05-12 (×2): 25 ug via INTRAVENOUS
  Administered 2015-05-12: 50 ug via INTRAVENOUS

## 2015-05-12 SURGICAL SUPPLY — 31 items
BLADE SURG 15 STRL LF DISP TIS (BLADE) ×1 IMPLANT
BLADE SURG 15 STRL SS (BLADE) ×3
CLIP LIGATING EXTRA MED SLVR (CLIP) ×3 IMPLANT
CLIP TI MEDIUM 6 (CLIP) ×3 IMPLANT
COVER BACK TABLE 60X90IN (DRAPES) ×3 IMPLANT
COVER MAYO STAND STRL (DRAPES) ×3 IMPLANT
DRAPE LAPAROTOMY 100X72 PEDS (DRAPES) ×3 IMPLANT
DRAPE UTILITY XL STRL (DRAPES) ×3 IMPLANT
ELECT COATED BLADE 2.86 ST (ELECTRODE) ×3 IMPLANT
ELECT REM PT RETURN 9FT ADLT (ELECTROSURGICAL) ×3
ELECTRODE REM PT RTRN 9FT ADLT (ELECTROSURGICAL) ×1 IMPLANT
GAUZE SPONGE 4X4 16PLY XRAY LF (GAUZE/BANDAGES/DRESSINGS) ×3 IMPLANT
GLOVE BIO SURGEON STRL SZ7.5 (GLOVE) ×3 IMPLANT
GLOVE BIOGEL PI IND STRL 8 (GLOVE) ×1 IMPLANT
GLOVE BIOGEL PI INDICATOR 8 (GLOVE) ×2
GLOVE INDICATOR 7.5 STRL GRN (GLOVE) ×9 IMPLANT
GOWN STRL REUS W/TWL LRG LVL3 (GOWN DISPOSABLE) ×3 IMPLANT
GOWN STRL REUS W/TWL XL LVL3 (GOWN DISPOSABLE) ×3 IMPLANT
IV NS 500ML (IV SOLUTION) ×3
IV NS 500ML BAXH (IV SOLUTION) ×1 IMPLANT
LIQUID BAND (GAUZE/BANDAGES/DRESSINGS) ×6 IMPLANT
NEEDLE HYPO 25X1 1.5 SAFETY (NEEDLE) ×3 IMPLANT
PACK BASIN DAY SURGERY FS (CUSTOM PROCEDURE TRAY) ×3 IMPLANT
PENCIL BUTTON HOLSTER BLD 10FT (ELECTRODE) ×3 IMPLANT
SPONGE GAUZE 4X4 12PLY STER LF (GAUZE/BANDAGES/DRESSINGS) ×3 IMPLANT
SPONGE LAP 18X18 X RAY DECT (DISPOSABLE) ×3 IMPLANT
SYR CONTROL 10ML LL (SYRINGE) ×3 IMPLANT
TOWEL OR 17X24 6PK STRL BLUE (TOWEL DISPOSABLE) ×6 IMPLANT
TUBE CONNECTING 12'X1/4 (SUCTIONS) ×1
TUBE CONNECTING 12X1/4 (SUCTIONS) ×2 IMPLANT
YANKAUER SUCT BULB TIP NO VENT (SUCTIONS) ×3 IMPLANT

## 2015-05-12 NOTE — Anesthesia Procedure Notes (Signed)
Procedure Name: LMA Insertion Date/Time: 05/12/2015 8:51 AM Performed by: Bethena Roys T Pre-anesthesia Checklist: Patient identified, Emergency Drugs available, Suction available and Patient being monitored Patient Re-evaluated:Patient Re-evaluated prior to inductionOxygen Delivery Method: Circle System Utilized Preoxygenation: Pre-oxygenation with 100% oxygen Intubation Type: IV induction Ventilation: Mask ventilation without difficulty LMA: LMA inserted LMA Size: 4.0 Number of attempts: 1 Airway Equipment and Method: Bite block Placement Confirmation: positive ETCO2 Tube secured with: Tape Dental Injury: Teeth and Oropharynx as per pre-operative assessment

## 2015-05-12 NOTE — Anesthesia Preprocedure Evaluation (Signed)
Anesthesia Evaluation  Patient identified by MRN, date of birth, ID band Patient awake    Reviewed: Allergy & Precautions, NPO status , Patient's Chart, lab work & pertinent test results  Airway Mallampati: I  TM Distance: >3 FB Neck ROM: Full    Dental   Pulmonary    Pulmonary exam normal        Cardiovascular hypertension, + Peripheral Vascular Disease  Normal cardiovascular exam     Neuro/Psych  Neuromuscular disease    GI/Hepatic   Endo/Other    Renal/GU      Musculoskeletal   Abdominal   Peds  Hematology  (+) anemia ,   Anesthesia Other Findings Endometrial CA w/ mets to lungs  Reproductive/Obstetrics                             Anesthesia Physical Anesthesia Plan  ASA: III  Anesthesia Plan: General   Post-op Pain Management:    Induction: Intravenous  Airway Management Planned: LMA and Oral ETT  Additional Equipment:   Intra-op Plan:   Post-operative Plan: Extubation in OR  Informed Consent: I have reviewed the patients History and Physical, chart, labs and discussed the procedure including the risks, benefits and alternatives for the proposed anesthesia with the patient or authorized representative who has indicated his/her understanding and acceptance.     Plan Discussed with: CRNA, Anesthesiologist and Surgeon  Anesthesia Plan Comments:         Anesthesia Quick Evaluation

## 2015-05-12 NOTE — Op Note (Signed)
   Pre-operative Diagnosis: History of metastatic endometrial cancer with recurrent right inguinal fluid collection.  Procedure: Incision and drainage of right inguinal fluid collection with capsulectomy of seroma cavity Right inguinal wound VAC placement (10 x 9 x 6 cm)  Post-operative Diagnosis: Same  Surgeon: Malachi Bonds  Anesthesia:  General endotracheal with quarter percent Marcaine with epinephrine ileo-inguinal nerve block  IVF: 400 ml  EBL: 25 ml  Drains: Right inguinal wound VAC  Complications: None; patient tolerated the procedure well.   Disposition: PACU - hemodynamically stable.    Indication for Procedure: Patient is a very pleasant 60 year old lady with a history of metastatic uterine cancer undergoing chemotherapy, who is developed a right inguinal fluid collection that was percutaneously drained on multiple occasions with recurrence. This is caused significant discomfort for her and after various options were reviewed she is elected to move forward with incision and drainage with wound VAC placement and possible mobilization of sartorius muscle to cover vessels if they are exposed.  Operative Findings: Liquefied thick maroon colored fluid without odor. Well-defined capsule around the fluid collection. After resection of capsule there was adequate tissue overlying the vessels, therefore mobilization of sartorius was not necessary.  Procedure in Detail: After informed consent was obtained the patient identified taken to the operating room and placed in supine position. Timeout was performed and the patient was prepped and draped in sterile fashion. The previously identified and marked. The right inguinal area was incised to create an opening and there was a capsule immediately identified. Fluid was collected and sent for cytology and culture. There was necrotic debris and thick maroon fluid without evidence of odor. Dissection was carried out circumferentially to  excise the capsule in its entirety. This was then passed off the field for permanent. On inspection of the resection cavity there was no evidence of exposed vessels and there was adequate fatty tissue overlying this area therefore the decision was made not to mobilize the sartorius muscle. After hemostasis was ensured the wound was irrigated with copious amounts of warm irrigation. A wound VAC sponge was cut to size. The cavity that remained was 10 x 9 x 6 cm. The sponge was placed and then sealed and attached to the wound VAC pump with excellent seal. At the end of the case sponge and needle counts were reported as correct 2. I was present and scrubbed for the entire procedure.

## 2015-05-12 NOTE — Discharge Instructions (Signed)
OK to shower.  No driving until cleared in clinic and while on pain medication. Homehealth to manage the wound vac. Post Anesthesia Home Care Instructions  Activity: Get plenty of rest for the remainder of the day. A responsible adult should stay with you for 24 hours following the procedure.  For the next 24 hours, DO NOT: -Drive a car -Paediatric nurse -Drink alcoholic beverages -Take any medication unless instructed by your physician -Make any legal decisions or sign important papers.  Meals: Start with liquid foods such as gelatin or soup. Progress to regular foods as tolerated. Avoid greasy, spicy, heavy foods. If nausea and/or vomiting occur, drink only clear liquids until the nausea and/or vomiting subsides. Call your physician if vomiting continues.  Special Instructions/Symptoms: Your throat may feel dry or sore from the anesthesia or the breathing tube placed in your throat during surgery. If this causes discomfort, gargle with warm salt water. The discomfort should disappear within 24 hours.  If you had a scopolamine patch placed behind your ear for the management of post- operative nausea and/or vomiting:  1. The medication in the patch is effective for 72 hours, after which it should be removed.  Wrap patch in a tissue and discard in the trash. Wash hands thoroughly with soap and water. 2. You may remove the patch earlier than 72 hours if you experience unpleasant side effects which may include dry mouth, dizziness or visual disturbances. 3. Avoid touching the patch. Wash your hands with soap and water after contact with the patch.   Vacuum-Assisted Closure Therapy Vacuum-assisted closure (VAC) therapy uses a device that removes fluid and germs from wounds to help them heal. It is used on wounds that cannot be closed with stitches. They often heal slowly. Vacuum-assisted therapy helps the wound stay clean and healthy while the open wound slowly grows back  together. Vacuum-assisted closure therapy uses a bandage (dressing) that is made of foam. It is put inside the wound. Then, a drape is placed over the wound. This drape sticks to your skin to keep air out, and to protect the wound. A tube is hooked up to a small pump and is attached to the drape. The pump sucks out the fluid and germs. Vacuum-assisted closure therapy can also help reduce the bad smell that comes from the wound. HOW DOES IT WORK?  The vacuum pump pulls fluid through the foam dressing. The dressing may wrinkle during this process. The fluid goes into the tube and away from the wound. The fluid then goes into a container. The fluid in the container must be replaced if it is full or at least once a week, even if the container is not full. The pulling from the pump helps to close the wound and bring better circulation to the wound area. The foam dressing covers and protects the wound. It helps your wound heal faster.  HOW DOES IT FEEL?   You might feel a little pulling when the pump is on.  You might also feel a mild vibrating sensation.  You might feel some discomfort when the dressing is taken off. CAN I MOVE AROUND WITH VACUUM-ASSISTED CLOSURE THERAPY? Yes, it has a backup battery which is used when the machine is not plugged in, as long as the battery is working, you can move freely. WHAT ARE SOME THINGS I MUST KNOW?  Do not turn off the pump yourself, unless instructed to do so by your healthcare provider, such as for bathing.  Do not take  off the dressing yourself, unless instructed to do so by your caregiver.  You can wash or shower with the dressing. However, do not take the pump into the shower. Make sure the wound dressing is protected and covered with plastic. The wound area must stay dry.  Do not turn off the pump for more than 2 hours. If the pump is off for more than 2 hours, your nurse must change your dressing.  Check frequently that the machine is on, that the  machine indicates the therapy is on, and that all clamps are open. THE ALARM IS SOUNDING! WHAT SHOULD I DO?   Stay calm.  Do not turn off the pump or do anything with the dressing.  Call your clinic or caregiver right away if the alarm goes off and you cannot fix the problem. Some reasons the alarm might go off include:  The fluid collection container is full.  The battery is low.  The dressing has a leak.  Explain to your caregiver what is happening. Follow the instructions you receive. WHEN SHOULD I CALL FOR HELP?   You have severe pain.  You have difficulty breathing.  You have bleeding that will not stop.  Your wound smells bad.  You have redness, swelling, or fluid leaking from your wound.  Your alarm goes off and you do not know what to do.  You have a fever.  Your wound itches severely.  Your dressing changes are often painful or bleeding often occurs.  You have diarrhea.  You have a sore throat.  You have a rash around the dressing or anywhere else on your body.  You feel nauseous.  You feel dizzy or weak.  The [facility] machine has been off for more than 2 hours. HOW DO I GET READY TO GO HOME WITH A PUMP?  A trained caregiver will talk to you and answer your questions about your vacuum-assisted closure therapy before you go home. He or she will explain what to expect. A caregiver will come to your home to apply the pump and care for your wound. The at-home caregiver will be available for questions and will come back for the scheduled dressing changes, usually every 48-72 hours (or more often for severely infected wounds). Your at-home caregiver will also come if you are having an unexpected problem. If you have questions or do not know what to do when you go home, talk to your healthcare provider.   This information is not intended to replace advice given to you by your health care provider. Make sure you discuss any questions you have with your health care  provider.   Document Released: 12/01/2007 Document Revised: 08/20/2012 Document Reviewed: 12/01/2010 Elsevier Interactive Patient Education 2016 [employer]. Call your surgeon if you experience:   1.  Fever over 101.0. 2.  Inability to urinate. 3.  Nausea and/or vomiting. 4.  Extreme swelling or bruising at the surgical site. 5.  Continued bleeding from the incision. 6.  Increased pain, redness or drainage from the incision. 7.  Problems related to your pain medication. 8.  Any problems and/or concerns

## 2015-05-12 NOTE — Transfer of Care (Signed)
Immediate Anesthesia Transfer of Care Note  Patient: [name]  Procedure(s) Performed: Procedure(s): INCISION AND DRAINGE RIGHT INGUINAL FLUID COLLECTION  (Right) APPLICATION OF WOUND VAC (Right)  Patient Location: PACU  Anesthesia Type:General  Level of Consciousness: awake and oriented  Airway & Oxygen Therapy: Patient Spontanous Breathing and Patient connected to nasal cannula oxygen  Post-op Assessment: Report given to RN  Post vital signs: Reviewed and stable  Last Vitals:156/73, 76, 15, 100%, 98.4   Filed Vitals:   05/12/15 0755  BP: 124/69  Pulse: 70  Temp: 36.9 C  Resp: 12    Last Pain: There were no vitals filed for this visit.    Patients Stated Pain Goal: 8 ([id number])  Complications: No apparent anesthesia complications

## 2015-05-12 NOTE — H&P (Signed)
No significant changes. Alert and oriented RRR Abd soft with tense right inguinal fluid collection without significant chante A/P Right inguinal symptomatic fluid collection.  Move forward with incision and drainage with mobilization of the sartorius muscle to cover femoral vessels with wound vac placement.

## 2015-05-13 ENCOUNTER — Telehealth: Payer: Self-pay | Admitting: Gynecologic Oncology

## 2015-05-13 NOTE — Anesthesia Postprocedure Evaluation (Signed)
Anesthesia Post Note  Patient: [name]  Procedure(s) Performed: Procedure(s) (LRB): INCISION AND DRAINGE RIGHT INGUINAL FLUID COLLECTION  (Right) APPLICATION OF WOUND VAC (Right)  Patient location during evaluation: PACU Anesthesia Type: General Level of consciousness: awake, oriented and patient cooperative Pain management: pain level controlled Vital Signs Assessment: post-procedure vital signs reviewed and stable Respiratory status: spontaneous breathing and respiratory function stable Cardiovascular status: stable Anesthetic complications: no    Last Vitals:  Filed Vitals:   05/12/15 1115 05/12/15 1215  BP: 140/74 149/69  Pulse: 73 60  Temp:  36.7 C  Resp: 11 12    Last Pain:  Filed Vitals:   05/12/15 1230  PainSc: Asleep                 Issa Luster EDWARD

## 2015-05-13 NOTE — Telephone Encounter (Signed)
Attempted to call patient to see how she was doing post-op. Unable to leave a message since mailbox was full.  Will re-attempt call at a later time.

## 2015-05-15 LAB — WOUND CULTURE: Culture: NO GROWTH

## 2015-05-16 ENCOUNTER — Encounter (HOSPITAL_BASED_OUTPATIENT_CLINIC_OR_DEPARTMENT_OTHER): Payer: Self-pay | Admitting: General Surgery

## 2015-05-16 ENCOUNTER — Telehealth: Payer: Self-pay | Admitting: Gynecologic Oncology

## 2015-05-16 NOTE — Telephone Encounter (Signed)
Spoke with patient about post-operative status.  She states she is "doing good."  The [facility] RN came out on Saturday and changed the negative pressure therapy dressing and is coming out tomorrow.  She is taking morphine IR for pain.  Advised to call for any concerns.

## 2015-05-17 ENCOUNTER — Telehealth: Payer: Self-pay | Admitting: Gynecologic Oncology

## 2015-05-17 NOTE — Telephone Encounter (Signed)
Called to check on patient's status.  Patient stating she is doing well.  Informed patient that I spoke with Dr. Marko Plume about her pain management post-op and advised her that she could take up to two tablets of the morphine IR if needed for severe pain.  Advised to take one first and see how she feels then if she needed another, she could take that.  Follow up appts arranged for next week.  Advised to call for any needs or concerns.

## 2015-05-18 ENCOUNTER — Emergency Department (HOSPITAL_COMMUNITY)
Admission: EM | Admit: 2015-05-18 | Discharge: 2015-05-19 | Disposition: A | Discharge status: Home / Self Care | Payer: Medicaid Other | Attending: Emergency Medicine | Admitting: Emergency Medicine

## 2015-05-18 ENCOUNTER — Encounter (HOSPITAL_COMMUNITY): Payer: Self-pay | Admitting: Emergency Medicine

## 2015-05-18 DIAGNOSIS — Z8544 Personal history of malignant neoplasm of other female genital organs: Secondary | ICD-10-CM | POA: Diagnosis not present

## 2015-05-18 DIAGNOSIS — I10 Essential (primary) hypertension: Secondary | ICD-10-CM | POA: Insufficient documentation

## 2015-05-18 DIAGNOSIS — Z7901 Long term (current) use of anticoagulants: Secondary | ICD-10-CM | POA: Diagnosis not present

## 2015-05-18 DIAGNOSIS — R509 Fever, unspecified: Secondary | ICD-10-CM | POA: Insufficient documentation

## 2015-05-18 NOTE — ED Notes (Addendum)
Patient presents for fever (101.4), 500mg  tylenol approximately 2100 today. Denies N/V or other c/c. Last Chemo approximately two weeks ago.

## 2015-05-19 ENCOUNTER — Encounter (HOSPITAL_COMMUNITY): Payer: Self-pay | Admitting: *Deleted

## 2015-05-19 ENCOUNTER — Emergency Department (HOSPITAL_COMMUNITY): Payer: Medicaid Other

## 2015-05-19 LAB — URINALYSIS, ROUTINE W REFLEX MICROSCOPIC
BILIRUBIN URINE: NEGATIVE
Glucose, UA: NEGATIVE mg/dL
Hgb urine dipstick: NEGATIVE
Ketones, ur: NEGATIVE mg/dL
NITRITE: NEGATIVE
PH: 6.5 (ref 5.0–8.0)
Protein, ur: NEGATIVE mg/dL
SPECIFIC GRAVITY, URINE: 1.008 (ref 1.005–1.030)

## 2015-05-19 LAB — CBC WITH DIFFERENTIAL/PLATELET
BASOS ABS: 0 10*3/uL (ref 0.0–0.1)
BASOS PCT: 0 %
EOS ABS: 0.1 10*3/uL (ref 0.0–0.7)
Eosinophils Relative: 1 %
HCT: 23.1 % — ABNORMAL LOW (ref 36.0–46.0)
HEMOGLOBIN: 7.5 g/dL — AB (ref 12.0–15.0)
LYMPHS PCT: 9 %
Lymphs Abs: 0.8 10*3/uL (ref 0.7–4.0)
MCH: 27.8 pg (ref 26.0–34.0)
MCHC: 32.5 g/dL (ref 30.0–36.0)
MCV: 85.6 fL (ref 78.0–100.0)
MONO ABS: 1 10*3/uL (ref 0.1–1.0)
Monocytes Relative: 11 %
NEUTROS PCT: 79 %
Neutro Abs: 7.2 10*3/uL (ref 1.7–7.7)
PLATELETS: 329 10*3/uL (ref 150–400)
RBC: 2.7 MIL/uL — AB (ref 3.87–5.11)
RDW: 22.3 % — ABNORMAL HIGH (ref 11.5–15.5)
WBC: 9.1 10*3/uL (ref 4.0–10.5)

## 2015-05-19 LAB — I-STAT CHEM 8, ED
BUN: 8 mg/dL (ref 6–20)
CHLORIDE: 99 mmol/L — AB (ref 101–111)
Calcium, Ion: 1.2 mmol/L (ref 1.13–1.30)
Creatinine, Ser: 1 mg/dL (ref 0.44–1.00)
Glucose, Bld: 123 mg/dL — ABNORMAL HIGH (ref 65–99)
HEMATOCRIT: 23 % — AB (ref 36.0–46.0)
Hemoglobin: 7.8 g/dL — ABNORMAL LOW (ref 12.0–15.0)
Potassium: 3.3 mmol/L — ABNORMAL LOW (ref 3.5–5.1)
SODIUM: 137 mmol/L (ref 135–145)
TCO2: 23 mmol/L (ref 0–100)

## 2015-05-19 LAB — COMPREHENSIVE METABOLIC PANEL
ALBUMIN: 3.3 g/dL — AB (ref 3.5–5.0)
ALT: 8 U/L — AB (ref 14–54)
ANION GAP: 7 (ref 5–15)
AST: 12 U/L — ABNORMAL LOW (ref 15–41)
Alkaline Phosphatase: 97 U/L (ref 38–126)
BUN: 10 mg/dL (ref 6–20)
CALCIUM: 8.9 mg/dL (ref 8.9–10.3)
CHLORIDE: 102 mmol/L (ref 101–111)
CO2: 26 mmol/L (ref 22–32)
CREATININE: 0.93 mg/dL (ref 0.44–1.00)
GFR calc non Af Amer: >60 mL/min (ref >60)
GLUCOSE: 124 mg/dL — AB (ref 65–99)
Potassium: 3.3 mmol/L — ABNORMAL LOW (ref 3.5–5.1)
SODIUM: 135 mmol/L (ref 135–145)
Total Bilirubin: 0.7 mg/dL (ref 0.3–1.2)
Total Protein: 6.5 g/dL (ref 6.5–8.1)

## 2015-05-19 LAB — URINE MICROSCOPIC-ADD ON: RBC / HPF: NONE SEEN RBC/hpf (ref 0–5)

## 2015-05-19 LAB — I-STAT CG4 LACTIC ACID, ED: LACTIC ACID, VENOUS: 0.86 mmol/L (ref 0.5–2.0)

## 2015-05-19 LAB — LIPASE, BLOOD: Lipase: 12 U/L (ref 11–51)

## 2015-05-19 MED ORDER — SODIUM CHLORIDE 0.9 % IV BOLUS (SEPSIS)
1000.0000 mL | Freq: Once | INTRAVENOUS | Status: AC
  Administered 2015-05-19: 1000 mL via INTRAVENOUS

## 2015-05-19 NOTE — Discharge Instructions (Signed)
Fever, Adult [name], take tylenol at home for fever and see your primary doctor within 3 days for close follow up.  If any symptoms worsen, come back to the ED immediately. Thank you. A fever is an increase in the body's temperature. It is often defined as a temperature of 100 F (38C) or higher. Short mild or moderate fevers often have no long-term effects. They also often do not need treatment. Moderate or high fevers may make you feel uncomfortable. Sometimes, they can also be a sign of a serious illness or disease. The sweating that may happen with repeated fevers or fevers that last a while may also cause you to not have enough fluid in your body (dehydration). You can take your temperature with a thermometer to see if you have a fever. A measured temperature can change with:  Age.  Time of day.  Where the thermometer is placed:  Mouth (oral).  Rectum (rectal).  Ear (tympanic).  Underarm (axillary).  Forehead (temporal). HOME CARE Pay attention to any changes in your symptoms. Take these actions to help with your condition:  Take over-the-counter and prescription medicines only as told by your doctor. Follow the dosing instructions carefully.  If you were prescribed an antibiotic medicine, take it as told by your doctor. Do not stop taking the antibiotic even if you start to feel better.  Rest as needed.  Drink enough fluid to keep your pee (urine) clear or pale yellow.  Sponge yourself or bathe with room-temperature water as needed. This helps to lower your body temperature . Do not use ice water.  Do not wear too many blankets or heavy clothes. GET HELP IF:  You throw up (vomit).  You cannot eat or drink without throwing up.  You have watery poop (diarrhea).  It hurts when you pee.  Your symptoms do not get better with treatment.  You have new symptoms.  You feel very weak. GET HELP RIGHT AWAY IF:  You are short of breath or have trouble  breathing.  You are dizzy or you pass out (faint).  You feel confused.  You have signs of not having enough fluid in your body, such as:  A dry mouth.  Peeing less.  Looking pale.  You have very bad pain in your belly (abdomen).  You keep throwing up or having water poop.  You have a skin rash.  Your symptoms suddenly get worse.   This information is not intended to replace advice given to you by your health care provider. Make sure you discuss any questions you have with your health care provider.   Document Released: 09/27/2007 Document Revised: 09/08/2014 Document Reviewed: 02/11/2014 Elsevier Interactive Patient Education [employer].

## 2015-05-19 NOTE — ED Provider Notes (Signed)
CSN: [id number]     Arrival date & time 05/18/15  2303 History   First MD Initiated Contact with Patient 05/19/15 0038     No chief complaint on file.    (Consider location/radiation/quality/duration/timing/severity/associated sxs/prior Treatment) HPI  [name] is a 60 yo female PMH endometrial cancer on chemo (last session 2-3 weeks ago) an recent surgery last week to remove mass in pelvis, here with fever to 101.4 at home. She took tylenol 1000mg  and came to the ED for evaluation.  Patient denies any infectious symptoms such as cough, vomiting, diarrhea, urinary symptoms.  She states home nurse stated the wound looked good.  She did describe chills, which is why she checked her temperature in the first place. There are no further complaints.  10 Systems reviewed and are negative for acute change except as noted in the HPI.     Past Medical History  Diagnosis Date  . History of radiation therapy 10/21/08-11/23/08 & 12/07/10/13/12/23 2010    ENDOMETRIOID   . Hypertension   . Iron deficiency anemia     chronic severe  . Chemotherapy-induced neuropathy ([place])   . History of adenomatous polyp of colon     tubulovillious adenoma 09/ 2010  . 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 Inguinal fluid collection 02/2015    right-drained x2  . History of ovarian cyst     complex  . History of uterine fibroid   . Family history of breast cancer   . DVT, lower extremity ([place])     left  02-12-2015  currently on Eliqius  . Endometrial cancer [facility]) oncologist-  dr Marko Plume    02-2015  . Metastasis to lung ([place])   . 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 Tubal ligation  1990's  . Colonoscopy w/ polypectomy  09-27-2008  . Exploratory laparotomy /  total abdominal hysterectomy/ bilateral salpinoophorectomy/  partial omentectomy  03-18-2008  . Umbilical hernia repair      infant  . Abdominal hysterectomy    . Porta cath  06/2010   removal  . Inguinal hernia repair Right 05/12/2015    Procedure: INCISION AND DRAINGE RIGHT INGUINAL FLUID COLLECTION ;  Surgeon: Hall Busing, MD;  Location: [facility];  Service: General;  Laterality: Right;  . Application of wound vac Right 05/12/2015    Procedure: APPLICATION OF WOUND VAC;  Surgeon: Hall Busing, MD;  Location: [facility];  Service: General;  Laterality: Right;   Family History  Problem Relation Age of Onset  . Bone cancer Paternal Grandmother     OSSEOUS METASTASIS  . Heart disease Father   . Heart disease Sister   . Bone cancer Maternal Grandmother 75  . Leukemia Maternal Grandfather 81  . Liver cancer Maternal Uncle   . Breast cancer Cousin 74    maternal first cousin   Social History  Substance Use Topics  . Smoking status: Never Smoker   . Smokeless tobacco: Never Used  . Alcohol Use: No   OB History    No data available     Review of Systems    Allergies  Review of patient's allergies indicates no known allergies.  Home Medications   Prior to Admission medications   Medication Sig Start Date End Date Taking? Authorizing Provider  apixaban (ELIQUIS) 5 MG TABS tablet Take 1 tablet (5 mg total) by mouth 2 (two) times daily. 03/03/15  Lennis Marion Downer, MD  cloNIDine (CATAPRES) 0.1 MG tablet Take 0.1 mg by mouth daily.  02/25/15   Historical Provider, MD  dexamethasone (DECADRON) 4 MG tablet Take 2 tablets (8 mg total) by mouth 2 (two) times daily. For 3 days.  Start the day before Taxotere. 05/10/15   Lennis Marion Downer, MD  ferrous sulfate 325 (65 FE) MG tablet Take 1 tablet (325 mg total) by mouth 2 (two) times daily with a meal. 02/17/15   Robbie Lis, MD  hydrochlorothiazide (HYDRODIURIL) 25 MG tablet Take 25 mg by mouth daily.   11/01/08   Historical Provider, MD  labetalol (NORMODYNE) 100 MG tablet Take 2 tablets (200 mg total) by mouth 3 (three) times daily. Patient taking differently: Take 200 mg by mouth 2 (two)  times daily.  03/31/15   Lennis Marion Downer, MD  lactated ringers infusion Inject 50 mLs into the vein continuous. 05/12/15   Hall Busing, MD  LORazepam (ATIVAN) 0.5 MG tablet Place 1 tablet under the tongue or swallow every 6 hrs as needed for nausea. Patient not taking: Reported on 05/09/2015 04/04/15   Gordy Levan, MD  morphine (MSIR) 15 MG tablet Take 1 tablet (15 mg total) by mouth every 4 (four) hours as needed for moderate pain or severe pain. 05/09/15   Lennis Marion Downer, MD  pantoprazole (PROTONIX) 40 MG tablet Take 1 tablet (40 mg total) by mouth daily. 03/14/15   Lennis Marion Downer, MD  polyethylene glycol (MIRALAX / GLYCOLAX) packet Take 17 g by mouth 2 (two) times daily as needed.    Historical Provider, MD  potassium chloride (K-DUR) 10 MEQ tablet Take 2 tablets today and then 1 tablet daily 03/24/15   Lennis Marion Downer, MD  prochlorperazine (COMPAZINE) 10 MG tablet Take 1 tablet (10 mg total) by mouth every 6 (six) hours as needed (Nausea or vomiting). 02/22/15   Lennis Marion Downer, MD  zolpidem (AMBIEN) 5 MG tablet Take 1 tablet (5 mg total) by mouth at bedtime as needed for sleep. 04/21/15   Lennis P Livesay, MD   BP 188/79 mmHg  Pulse 106  Temp(Src) 100 F (37.8 C) (Oral)  Resp 21  SpO2 94% Physical Exam  Constitutional: She is oriented to person, place, and time. She appears well-developed and well-nourished. No distress.  HENT:  Head: Normocephalic and atraumatic.  Nose: Nose normal.  Mouth/Throat: Oropharynx is clear and moist. No oropharyngeal exudate.  Eyes: Conjunctivae and EOM are normal. Pupils are equal, round, and reactive to light. No scleral icterus.  Neck: Normal range of motion. Neck supple. No JVD present. No tracheal deviation present. No thyromegaly present.  Cardiovascular: Regular rhythm and normal heart sounds.  Exam reveals no gallop and no friction rub.   No murmur heard. tachycardic  Pulmonary/Chest: Effort normal and breath sounds normal. No respiratory  distress. She has no wheezes. She exhibits no tenderness.  Abdominal: Soft. Bowel sounds are normal. She exhibits no distension and no mass. There is no tenderness. There is no rebound and no guarding.  Wound vac to R pelvic area.  Appears well healed.  No sign tenderness or warmth  Musculoskeletal: Normal range of motion. She exhibits no edema or tenderness.  Lymphadenopathy:    She has no cervical adenopathy.  Neurological: She is alert and oriented to person, place, and time. No cranial nerve deficit. She exhibits normal muscle tone.  Skin: Skin is warm and dry. No rash noted. No erythema. No pallor.  Tactile fever  Nursing note and vitals reviewed.   ED Course  Procedures (including critical care time) Labs Review Labs Reviewed  CBC WITH DIFFERENTIAL/PLATELET - Abnormal; Notable for the following:    RBC 2.70 (*)    Hemoglobin 7.5 (*)    HCT 23.1 (*)    RDW 22.3 (*)    All other components within normal limits  COMPREHENSIVE METABOLIC PANEL - Abnormal; Notable for the following:    Potassium 3.3 (*)    Glucose, Bld 124 (*)    Albumin 3.3 (*)    AST 12 (*)    ALT 8 (*)    All other components within normal limits  URINALYSIS, ROUTINE W REFLEX MICROSCOPIC (NOT AT [facility]) - Abnormal; Notable for the following:    Leukocytes, UA TRACE (*)    All other components within normal limits  URINE MICROSCOPIC-ADD ON - Abnormal; Notable for the following:    Squamous Epithelial / LPF 0-5 (*)    Bacteria, UA RARE (*)    All other components within normal limits  I-STAT CHEM 8, ED - Abnormal; Notable for the following:    Potassium 3.3 (*)    Chloride 99 (*)    Glucose, Bld 123 (*)    Hemoglobin 7.8 (*)    HCT 23.0 (*)    All other components within normal limits  URINE CULTURE  CULTURE, BLOOD (ROUTINE X 2)  CULTURE, BLOOD (ROUTINE X 2)  LIPASE, BLOOD  I-STAT CG4 LACTIC ACID, ED    Imaging Review Dg Chest 2 View  05/19/2015  CLINICAL DATA:  Fever and weakness.  Chemotherapy  patient. EXAM: CHEST  2 VIEW COMPARISON:  Chest 06/07/2010.  CT chest 02/15/2015 FINDINGS: Mass in the left upper lung measuring about 4.4 x 5.4 cm. Large left hilar mass, margins indistinct. Appearance correlates with known mass lesions on previous CT chest. Multiple additional nodules seen on CT are not visualized on plain radiography. Heart size appears mildly enlarged but is obscured by the left hilar mass. Right lung is grossly clear and expanded. No blunting of costophrenic angles. No pneumothorax. IMPRESSION: Left lung and left hilar mass lesions identified, as seen on previous CT chest. No definite acute infiltration. Electronically Signed   By: Lucienne Capers M.D.   On: 05/19/2015 00:59   I have personally reviewed and evaluated these images and lab results as part of my medical decision-making.   EKG Interpretation None      MDM   Final diagnoses:  None    Patient presents to the ED for fever while on chemo.  Will obtain labs and infectious work up.  Temp is down to 37.8. She was given IVF.     CXR neg for pneumonia, UA does not show any infection.  Wound site on R hip appears well healed and patient states her pain is improving in that area since the surgery.  I do not believe this is a cause of her fever.  It may be a combination of chemo and her recent surgery.  She was advised on tylenol and ibuprofen at home.  PCP fu encouraged within 3 days.  She appears well and in NAD. VS remain within her normal limits and she is safe for DC.     Everlene Balls, MD 05/19/15 [phone]

## 2015-05-20 LAB — URINE CULTURE: Culture: 1000 — AB

## 2015-05-22 ENCOUNTER — Other Ambulatory Visit: Payer: Self-pay | Admitting: Oncology

## 2015-05-23 ENCOUNTER — Other Ambulatory Visit (HOSPITAL_BASED_OUTPATIENT_CLINIC_OR_DEPARTMENT_OTHER): Payer: Self-pay

## 2015-05-23 ENCOUNTER — Ambulatory Visit: Payer: Self-pay

## 2015-05-23 ENCOUNTER — Ambulatory Visit (HOSPITAL_COMMUNITY)
Admission: RE | Admit: 2015-05-23 | Discharge: 2015-05-23 | Disposition: A | Discharge status: Home / Self Care | Payer: Medicaid Other | Source: Ambulatory Visit | Attending: Oncology | Admitting: Oncology

## 2015-05-23 ENCOUNTER — Encounter: Payer: Self-pay | Admitting: Oncology

## 2015-05-23 ENCOUNTER — Ambulatory Visit (HOSPITAL_BASED_OUTPATIENT_CLINIC_OR_DEPARTMENT_OTHER): Payer: Self-pay | Admitting: Oncology

## 2015-05-23 ENCOUNTER — Telehealth: Payer: Self-pay | Admitting: Oncology

## 2015-05-23 VITALS — BP 144/58 | HR 75 | Temp 98.8°F | Resp 18 | Ht 62.0 in | Wt 174.1 lb

## 2015-05-23 DIAGNOSIS — D6481 Anemia due to antineoplastic chemotherapy: Secondary | ICD-10-CM

## 2015-05-23 DIAGNOSIS — Z8543 Personal history of malignant neoplasm of ovary: Secondary | ICD-10-CM

## 2015-05-23 DIAGNOSIS — G893 Neoplasm related pain (acute) (chronic): Secondary | ICD-10-CM

## 2015-05-23 DIAGNOSIS — Z7901 Long term (current) use of anticoagulants: Secondary | ICD-10-CM

## 2015-05-23 DIAGNOSIS — C569 Malignant neoplasm of unspecified ovary: Secondary | ICD-10-CM

## 2015-05-23 DIAGNOSIS — E876 Hypokalemia: Secondary | ICD-10-CM

## 2015-05-23 DIAGNOSIS — D500 Iron deficiency anemia secondary to blood loss (chronic): Secondary | ICD-10-CM | POA: Diagnosis present

## 2015-05-23 DIAGNOSIS — Z8542 Personal history of malignant neoplasm of other parts of uterus: Secondary | ICD-10-CM

## 2015-05-23 DIAGNOSIS — C801 Malignant (primary) neoplasm, unspecified: Secondary | ICD-10-CM

## 2015-05-23 DIAGNOSIS — T451X5A Adverse effect of antineoplastic and immunosuppressive drugs, initial encounter: Secondary | ICD-10-CM

## 2015-05-23 DIAGNOSIS — I82402 Acute embolism and thrombosis of unspecified deep veins of left lower extremity: Secondary | ICD-10-CM

## 2015-05-23 DIAGNOSIS — D509 Iron deficiency anemia, unspecified: Secondary | ICD-10-CM

## 2015-05-23 DIAGNOSIS — C778 Secondary and unspecified malignant neoplasm of lymph nodes of multiple regions: Secondary | ICD-10-CM

## 2015-05-23 DIAGNOSIS — G620 Drug-induced polyneuropathy: Secondary | ICD-10-CM

## 2015-05-23 DIAGNOSIS — I10 Essential (primary) hypertension: Secondary | ICD-10-CM

## 2015-05-23 DIAGNOSIS — C7800 Secondary malignant neoplasm of unspecified lung: Secondary | ICD-10-CM

## 2015-05-23 LAB — CBC WITH DIFFERENTIAL/PLATELET
BASO%: 0.5 % (ref 0.0–2.0)
Basophils Absolute: 0.1 10*3/uL (ref 0.0–0.1)
EOS%: 5.3 % (ref 0.0–7.0)
Eosinophils Absolute: 0.5 10*3/uL (ref 0.0–0.5)
HEMATOCRIT: 24.3 % — AB (ref 34.8–46.6)
HGB: 7.8 g/dL — ABNORMAL LOW (ref 11.6–15.9)
LYMPH#: 0.9 10*3/uL (ref 0.9–3.3)
LYMPH%: 9.4 % — ABNORMAL LOW (ref 14.0–49.7)
MCH: 27.9 pg (ref 25.1–34.0)
MCHC: 32.1 g/dL (ref 31.5–36.0)
MCV: 86.8 fL (ref 79.5–101.0)
MONO#: 1 10*3/uL — ABNORMAL HIGH (ref 0.1–0.9)
MONO%: 9.7 % (ref 0.0–14.0)
NEUT%: 75.1 % (ref 38.4–76.8)
NEUTROS ABS: 7.6 10*3/uL — AB (ref 1.5–6.5)
Platelets: 456 10*3/uL — ABNORMAL HIGH (ref 145–400)
RBC: 2.8 10*6/uL — ABNORMAL LOW (ref 3.70–5.45)
RDW: 20.9 % — AB (ref 11.2–14.5)
WBC: 10.1 10*3/uL (ref 3.9–10.3)

## 2015-05-23 LAB — COMPREHENSIVE METABOLIC PANEL
AST: 7 U/L (ref 5–34)
Albumin: 3 g/dL — ABNORMAL LOW (ref 3.5–5.0)
Alkaline Phosphatase: 103 U/L (ref 40–150)
Anion Gap: 9 mEq/L (ref 3–11)
BUN: 10.1 mg/dL (ref 7.0–26.0)
CALCIUM: 9.5 mg/dL (ref 8.4–10.4)
CHLORIDE: 104 meq/L (ref 98–109)
CO2: 27 meq/L (ref 22–29)
CREATININE: 1.2 mg/dL — AB (ref 0.6–1.1)
EGFR: 54 mL/min/{1.73_m2} — ABNORMAL LOW (ref >90)
Glucose: 106 mg/dl (ref 70–140)
Potassium: 3.4 mEq/L — ABNORMAL LOW (ref 3.5–5.1)
Sodium: 141 mEq/L (ref 136–145)
TOTAL PROTEIN: 6.9 g/dL (ref 6.4–8.3)
Total Bilirubin: 0.39 mg/dL (ref 0.20–1.20)

## 2015-05-23 MED ORDER — POTASSIUM CHLORIDE ER 10 MEQ PO TBCR: EXTENDED_RELEASE_TABLET | ORAL | Status: DC

## 2015-05-23 NOTE — Progress Notes (Signed)
OFFICE PROGRESS NOTE   May 23, 2015   Physicians: E.Rossi, J.Kinard, C.Windham Verl Blalock, [facility]) New patient referral made to [facility] for PCP, not established yet.   INTERVAL HISTORY:  Patient is seen, together with daughter and another family member, in continuing attention to extensively metastatic gyn carcinoma for which she continues chemotherapy, now with single agent taxotere. She had drainage of tumor associated seroma right inguinal area by Dr Rolanda Jay on 05-12-15, with wound vac managed by [facility], did not need sartorius muscle coverage of vessels. She has follow up visit with Dr Rolanda Jay on 05-24-15. Pathology [phone]) from seroma capsule had adenocarcinoma consistent with prior ovarian malignancy; cytology of the seroma fluid ([id number]) had atypical cells suspicious for malignancy. Eliquis was held for 48 hrs prior to the surgery, then resumed.   Cycle 5 chemo has been delayed for a week with the surgical procedure, now planned for 05-26-15. We can delay treatment further if needed for wound healing after she sees Dr Rolanda Jay tomorrow.    She was seen in ED on 05-19-15 with temperature 101.4 and chills, blood and urine cultures and CXR negative. She has had no further fever and no localizing symptoms of infection. Patient is more fatigued in past week, with hemoglobin down to 7.5 in ED and 7.8 today, with serosanguinous/ bloody fluid in wound vac. She has had no other bleeding, has been taking oral iron. She has needed prn MSIR 15 mg generally once daily for mid back pain (tumor left psoas into L4-5), and has used pain medication at times prior to changing wound vac. She is eating, no nausea or vomiting, bowels ok. No LE swelling. Bladder ok. She is SOB and generally fatigued with exertion including walking into office, no cough, no chest pain. No increased peripheral neuropathy. Remainder of 10 point Review of Systems negative/ unchanged.     She continues  Eliquis for LLE DVT  No central catheter Genetics counseling accomplished earlier today, testing to be done after medicaid obtained per patient's request.  Flu vaccine 02-21-15 CA 125 on 02-14-15 642.9 Feraheme 02-16-15 and [id number].  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 She was lost to follow up for the gyn cancer after 11-2010, until presented with recurrent disease. Patient reported right inguinal mass for ~ 2 months, then LLE pain for ~ 3 weeks when she presented to ED on 02-12-15. CT AP showed nodules in lung bases suspicious for metastatic disease, liver not remarkable, no hydronephrosis, 6 mm right renal calculus, thrombus left external iliac vein into left common femoral vein, retroperitoneal and pelvic adenopathy, an 8.5 x 7.1 cm necrotic right inguinal node, post hyst ooph, no ascites, likely tumor involvement in left psoas with adjacent involvement of L4 and L5 vertebral bodies; CT chest subsequently had multiple bilateral pulmonary nodules and central adenopathy. Patient was begun on heparin qtt which was transitioned to Eliquis (on pharmaceutical assistance program). CA 125 from 02-14-15 was 642.9, this having been 3.6 in 08-2010. Pathology from right inguinal nodal mass aspiration and core needle biopsy 02-15-15 necrotic material with metastatic adenocarcinoma with immunostains consistent with gyn primary ([id number], ER+). She was transfused 2 units PRBCs for hgb 6.7. Iron studies 2-12 with serum iron 14 and %sat 7,  given feraheme x2. Dr Denman George saw in consultation prior to starting carboplatin taxotere on 02-24-15. She reacted to [place] skin test prior to cycle 3,  treatment continued with taxotere only. She had I&D and capsulectomy of seroma cavity right inguinal region on 05-12-15.     Objective:  Vital signs in last 24 hours:  BP 144/58 mmHg  Pulse 75  Temp(Src) 98.8 F (37.1 C) (Oral)  Resp 18  Ht 5' 2"  (1.575 m)  Wt 174 lb 1.6 oz (78.971 kg)  BMI 31.84 kg/m2  SpO2 98% Weight up 2 lbs. Alert, oriented and appropriate. Ambulatory without assistance, carrying wound vac. Respirations not labored seated in exam room. Does not appear in pain. Alopecia  HEENT:PERRL, sclerae not icteric, no excessive tearing. Oral mucosa moist without lesions, posterior pharynx clear.  Neck supple. No JVD.  Lymphatics:no supraclavicular adenopathy Resp:somewhat diminished breath sounds bilaterally otherwise clear to auscultation bilaterally  Cardio: regular rate and rhythm. No gallop. GI: soft, nontender, not distended. Some bowel sounds present  Musculoskeletal/ Extremities: LE without pitting edema, cords, tenderness Neuro:  peripheral neuropathy feet stable. No LE weakness. Otherwise nonfocal. PSYCH appropriate mood and affect. Skin Mucous membranes and nailbeds pale, otherwise without rash, ecchymosis, petechiae Wound vac intact to right inguinal area, some bloody fluid in tubing. No surrounding swelling, fluctuance, erythema.   Lab Results:  Results for orders placed or performed in visit on 05/23/15  CBC with Differential  Result Value Ref Range   WBC 10.1 3.9 - 10.3 10e3/uL   NEUT# 7.6 (H) 1.5 - 6.5 10e3/uL   HGB 7.8 (L) 11.6 - 15.9 g/dL   HCT 24.3 (L) 34.8 - 46.6 %   Platelets 456 (H) 145 - 400 10e3/uL   MCV 86.8 79.5 - 101.0 fL   MCH 27.9 25.1 - 34.0 pg   MCHC 32.1 31.5 - 36.0 g/dL   RBC 2.80 (L) 3.70 - 5.45 10e6/uL   RDW 20.9 (H) 11.2 - 14.5 %   lymph# 0.9 0.9 - 3.3 10e3/uL   MONO# 1.0 (H) 0.1 - 0.9 10e3/uL   Eosinophils Absolute 0.5 0.0 - 0.5 10e3/uL   Basophils Absolute 0.1 0.0 - 0.1 10e3/uL   NEUT% 75.1 38.4 - 76.8 %   LYMPH% 9.4 (L)  14.0 - 49.7 %   MONO% 9.7 0.0 - 14.0 %   EOS% 5.3 0.0 - 7.0 %   BASO% 0.5 0.0 - 2.0 %  Comprehensive metabolic panel  Result Value Ref Range   Sodium 141 136 - 145 mEq/L   Potassium 3.4 (L) 3.5 - 5.1 mEq/L   Chloride 104 98 - 109 mEq/L   CO2 27 22 - 29 mEq/L   Glucose 106 70 - 140 mg/dl   BUN 10.1 7.0 - 26.0 mg/dL   Creatinine 1.2 (H) 0.6 - 1.1 mg/dL   Total Bilirubin 0.39 0.20 - 1.20 mg/dL   Alkaline Phosphatase 103 40 - 150 U/L   AST 7 5 - 34 U/L   ALT <9 0 - 55 U/L   Total Protein 6.9 6.4 - 8.3 g/dL   Albumin 3.0 (L) 3.5 - 5.0 g/dL   Calcium 9.5 8.4 - 10.4 mg/dL   Anion Gap 9 3 - 11 mEq/L   EGFR 54 (L) >90 ml/min/1.73 m2     Studies/Results:  PATSEY, PITSTICK E Collected: 05/12/2015 Client: [facility] Accession: [id number] Received: 05/12/2015 T. Tonye Becket, MDAL PATHOLOGY FINAL DIAGNOSIS Diagnosis Debridement, right inguinal - ADENOCARCINOMA ASSOCIATED WITH HEMATOMA. - SEE MICROSCOPIC DESCRIPTION. Microscopic Comment Within the  dermis and subcutaneous tissue there is a hemorrhagic cavity surrounded by granulation tissue and abundant hemosiderin deposition consistent with hematoma. In the adjacent connective tissue there are cystic glands with papillary protrusions consistent with adenocarcinoma. The morphologic features are most consistent with the previous ovarian carcinoma from 2010 ([id number]).    Bruce Donath E Collected: 05/12/2015 Client: [facility] Accession: [id number] Received: 05/12/2015 T. Tonye Becket, MD CYTOPATHOLOGY REPORT Adequacy Reason Satisfactory For Evaluation. Diagnosis CYTOLOGY FLUID: RIGHT INGUINAL FLUID(SPECIMEN 1 OF 1 COLLECTED 05/12/15):Marland Kitchen ABUNDANT BLOOD AND RARE ATYPICAL CELLS SUSPICIOUS FOR CARCINOMA.  Pathology discussed with Dr Rolanda Jay previously and with patient and family now.  EXAM: CHEST 2 VIEW  COMPARISON: Chest 06/07/2010. CT chest 02/15/2015  FINDINGS: Mass in the left upper  lung measuring about 4.4 x 5.4 cm. Large left hilar mass, margins indistinct. Appearance correlates with known mass lesions on previous CT chest. Multiple additional nodules seen on CT are not visualized on plain radiography. Heart size appears mildly enlarged but is obscured by the left hilar mass. Right lung is grossly clear and expanded. No blunting of costophrenic angles. No pneumothorax.  IMPRESSION: Left lung and left hilar mass lesions identified, as seen on previous CT chest. No definite acute infiltration.    Medications: I have reviewed the patient's current medications. Continue potassium supplement  DISCUSSION Patient is pleased with the surgical procedure and is tolerating the wound vac.  She is more symptomatic from anemia, multifactorial with initial iron deficiency, chemo and some blood loss from inguinal area. She agrees with transfusion 2 units PRBCs, which can be given at [facility] on 05-24-15 at 1100.   Chemo planned 05-26-15 can be delayed another week or so if that is preferable for the wound. She will have neulasta on day 3.  Will repeat scans at least after cycle 6.  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 Present metastatic disease involves large necrotic right inguinal node, paraaortic/ aortocaval/ external iliac nodes, and multiple bilateral pulmonary mets. PS and CA 125 improved with chemo thus far. Back pain well controlled with pain med ~ 1x daily; no zometa thus far. Decadron x 3 days around taxotere, neulasta required. Will give cycle 5 taxotere 05-26-15 if ok with surgery, or can delay further if needed.  Genetics testing recommended as this may give option for treatment with PARP inhibitor.  Patient prefers to wait for testing until Medicaid hopefully obtained.  2. Post I&D with capsulectomy of seroma cavity 05-12-15, wound vac. Still adenocarcinoma in that path. 3.LLE DVT documented 02-12-15: on Eliquis, no longer symptomatic. No IVC filter. Eliquis approved thru [employer] assistance x 1 year.  4. Progressive anemia: iron deficiency, chemo, surgical site. Hgb now 7.5 / 7.8, more symptomatic, will drop further with next chemo. Patient agrees with transfusion, 2 units PRBCs to be given at [facility] 1100 on 05-24-15. Crossmatch to be done today. Continues oral iron. Has not yet done hemoccult cards.  5. chemo nausea and vomiting improved with aloxi and protonix, continue. PO intake improved. 6.uncontrolled HTN initially: on labetalol 200 mg tid per urgent care + clonidine 0.1 mg tid and HCTZ. BP much better now. Still needs PCP to manage this 7.carbo skin test reaction cycle 3.  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 Stable or slightly progressive, however benefit from taxotere seems appropriate to continue. 10.Social: medicaid still pending, Energy manager involved. 11.difficult IV access not easy tho still manageable. Would need to be off Eliquis x 48 hrs for PAC by IR. 12.overdue mammograms, general medical care, colonoscopy. Noncompliance with follow up after cancer diagnoses and adjuvant treatment after ~ 2012. Noncompliant with care for HTN over past several years, despite family efforts ( daughter is pediatrician) Needs PCP but has not followed thru with referral to [facility]. 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 Repeat colonoscopy recommended 10-2009, apparently not done. Note iron deficiency. FOB not yet in EMR. 14.hypokalemia: continue supplement, follow   All questions answered. MD spoke directly with [facility], PRBC orders  placed. Chemo and neulasta orders confirmed, but will delay if needed. Time spent 30 min including >50% counseling and coordination of care.    Gordy Levan, MD   05/23/2015, 5:52 PM

## 2015-05-23 NOTE — Telephone Encounter (Signed)
appt made and avs printed °

## 2015-05-24 ENCOUNTER — Ambulatory Visit (HOSPITAL_COMMUNITY)
Admission: RE | Admit: 2015-05-24 | Discharge: 2015-05-24 | Disposition: A | Discharge status: Home / Self Care | Payer: Medicaid Other | Source: Ambulatory Visit | Attending: Oncology | Admitting: Oncology

## 2015-05-24 ENCOUNTER — Encounter: Payer: Self-pay | Admitting: General Surgery

## 2015-05-24 ENCOUNTER — Telehealth: Payer: Self-pay

## 2015-05-24 ENCOUNTER — Ambulatory Visit (HOSPITAL_BASED_OUTPATIENT_CLINIC_OR_DEPARTMENT_OTHER): Payer: Medicaid Other | Admitting: General Surgery

## 2015-05-24 VITALS — BP 169/70 | HR 66 | Temp 98.9°F | Resp 18

## 2015-05-24 VITALS — BP 144/72 | HR 86 | Temp 99.2°F | Resp 18 | Wt 174.0 lb

## 2015-05-24 DIAGNOSIS — R1909 Other intra-abdominal and pelvic swelling, mass and lump: Secondary | ICD-10-CM | POA: Diagnosis present

## 2015-05-24 DIAGNOSIS — D500 Iron deficiency anemia secondary to blood loss (chronic): Secondary | ICD-10-CM

## 2015-05-24 LAB — CULTURE, BLOOD (ROUTINE X 2)
Culture: NO GROWTH
Culture: NO GROWTH

## 2015-05-24 LAB — CA 125: Cancer Antigen (CA) 125: 48 U/mL — ABNORMAL HIGH (ref 0.0–38.1)

## 2015-05-24 LAB — PREPARE RBC (CROSSMATCH)

## 2015-05-24 MED ORDER — HEPARIN SOD (PORK) LOCK FLUSH 100 UNIT/ML IV SOLN: 500.0000 [IU] | Freq: Every day | INTRAVENOUS | Status: DC | PRN

## 2015-05-24 MED ORDER — SODIUM CHLORIDE 0.9% FLUSH: 3.0000 mL | INTRAVENOUS | Status: DC | PRN

## 2015-05-24 MED ORDER — SODIUM CHLORIDE 0.9% FLUSH: 10.0000 mL | INTRAVENOUS | Status: DC | PRN

## 2015-05-24 MED ORDER — ACETAMINOPHEN 325 MG PO TABS
325.0000 mg | ORAL_TABLET | Freq: Once | ORAL | Status: AC
  Administered 2015-05-24: 325 mg via ORAL
  Filled 2015-05-24 (×2): qty 1

## 2015-05-24 MED ORDER — HEPARIN SOD (PORK) LOCK FLUSH 100 UNIT/ML IV SOLN
250.0000 [IU] | INTRAVENOUS | Status: DC | PRN
Start: 2015-05-24 — End: 2015-05-25

## 2015-05-24 MED ORDER — SODIUM CHLORIDE 0.9 % IV SOLN
250.0000 mL | Freq: Once | INTRAVENOUS | Status: AC
  Administered 2015-05-24: 250 mL via INTRAVENOUS

## 2015-05-24 NOTE — Telephone Encounter (Signed)
Told Ms. Cogar the results of the CA-125 as noted below by Dr. Marko Plume.

## 2015-05-24 NOTE — Patient Instructions (Signed)
Please call for any questions or concerns.  We will follow up with you when you come to see Dr. Marko Plume.

## 2015-05-24 NOTE — Progress Notes (Signed)
The patient is here in routine follow-up status post incision and drainage in capsule resection of right inguinal fluid collection. Reports she is experiencing significant relief from the discomfort she had previously. She currently has a wound VAC in place and has been managed by home health and there is a good seal. She has no specific complaints at the present time other than fatigue related to her chemotherapy. She informed me that she scheduled to receive blood today.   I provided her copies of her pathology report. ADENOCARCINOMA ASSOCIATED WITH HEMATOMA.  She will be following up with Dr. Marko Plume for continued chemotherapy. We will be in contact with home health and when she is ready to have the wound VAC removed we will schedule her for follow-up at that point to inspect the wound with the wound VAC removed. She understands and agrees with plan as outlined above.

## 2015-05-24 NOTE — Telephone Encounter (Signed)
-----   Message from Gordy Levan, MD sent at 05/24/2015  8:41 AM EDT ----- Labs seen and need follow up: please let her know marker down to 81 yesterday - she is to see Dr Rolanda Jay this AM then PRBCs

## 2015-05-24 NOTE — Progress Notes (Signed)
Medical Provider: Evlyn Clines  Procedure: 2 units PRBC blood transfusion  Associated Diagnosis: Iron Deficiency Anemia due to chronic blood loss  Patient tolerated blood transfusion well. No reaction or shortness of breath. Went over discharge instructions with patient and copy given to patient. Questions answered. Alert, oriented and ambulatory at time of discharge. Voided before discharge. Discharged to home with family. Has all of her valuables.

## 2015-05-24 NOTE — Discharge Instructions (Signed)

## 2015-05-25 LAB — TYPE AND SCREEN
ABO/RH(D): O POS
ANTIBODY SCREEN: NEGATIVE
Unit division: 0
Unit division: 0

## 2015-05-26 ENCOUNTER — Other Ambulatory Visit (HOSPITAL_BASED_OUTPATIENT_CLINIC_OR_DEPARTMENT_OTHER): Payer: Self-pay

## 2015-05-26 ENCOUNTER — Ambulatory Visit (HOSPITAL_BASED_OUTPATIENT_CLINIC_OR_DEPARTMENT_OTHER): Payer: Self-pay

## 2015-05-26 VITALS — BP 161/82 | HR 77 | Temp 98.8°F | Resp 18

## 2015-05-26 DIAGNOSIS — Z5111 Encounter for antineoplastic chemotherapy: Secondary | ICD-10-CM

## 2015-05-26 DIAGNOSIS — C541 Malignant neoplasm of endometrium: Secondary | ICD-10-CM

## 2015-05-26 LAB — CBC WITH DIFFERENTIAL/PLATELET
BASO%: 0 % (ref 0.0–2.0)
BASOS ABS: 0 10*3/uL (ref 0.0–0.1)
EOS%: 0 % (ref 0.0–7.0)
Eosinophils Absolute: 0 10*3/uL (ref 0.0–0.5)
HCT: 32.4 % — ABNORMAL LOW (ref 34.8–46.6)
HGB: 10.7 g/dL — ABNORMAL LOW (ref 11.6–15.9)
LYMPH%: 3.7 % — ABNORMAL LOW (ref 14.0–49.7)
MCH: 27.7 pg (ref 25.1–34.0)
MCHC: 33 g/dL (ref 31.5–36.0)
MCV: 83.9 fL (ref 79.5–101.0)
MONO#: 0.1 10*3/uL (ref 0.1–0.9)
MONO%: 0.8 % (ref 0.0–14.0)
NEUT#: 12 10*3/uL — ABNORMAL HIGH (ref 1.5–6.5)
NEUT%: 95.5 % — AB (ref 38.4–76.8)
Platelets: 429 10*3/uL — ABNORMAL HIGH (ref 145–400)
RBC: 3.86 10*6/uL (ref 3.70–5.45)
RDW: 18.5 % — ABNORMAL HIGH (ref 11.2–14.5)
WBC: 12.6 10*3/uL — ABNORMAL HIGH (ref 3.9–10.3)
lymph#: 0.5 10*3/uL — ABNORMAL LOW (ref 0.9–3.3)

## 2015-05-26 LAB — COMPREHENSIVE METABOLIC PANEL
ALT: <9 U/L (ref 0–55)
AST: 8 U/L (ref 5–34)
Albumin: 3 g/dL — ABNORMAL LOW (ref 3.5–5.0)
Alkaline Phosphatase: 117 U/L (ref 40–150)
Anion Gap: 11 mEq/L (ref 3–11)
BUN: 11.6 mg/dL (ref 7.0–26.0)
CALCIUM: 9.9 mg/dL (ref 8.4–10.4)
CHLORIDE: 104 meq/L (ref 98–109)
CO2: 23 mEq/L (ref 22–29)
Creatinine: 1.1 mg/dL (ref 0.6–1.1)
EGFR: 65 mL/min/{1.73_m2} — ABNORMAL LOW (ref >90)
GLUCOSE: 174 mg/dL — AB (ref 70–140)
POTASSIUM: 4.2 meq/L (ref 3.5–5.1)
SODIUM: 138 meq/L (ref 136–145)
Total Bilirubin: 0.57 mg/dL (ref 0.20–1.20)
Total Protein: 7.4 g/dL (ref 6.4–8.3)

## 2015-05-26 MED ORDER — LORAZEPAM 1 MG PO TABS
0.5000 mg | ORAL_TABLET | Freq: Once | ORAL | Status: AC | PRN
  Administered 2015-05-26: 0.5 mg via ORAL

## 2015-05-26 MED ORDER — SODIUM CHLORIDE 0.9 % IV SOLN
Freq: Once | INTRAVENOUS | Status: AC
  Administered 2015-05-26: 10:00:00 via INTRAVENOUS

## 2015-05-26 MED ORDER — PALONOSETRON HCL INJECTION 0.25 MG/5ML
0.2500 mg | Freq: Once | INTRAVENOUS | Status: AC
  Administered 2015-05-26: 0.25 mg via INTRAVENOUS

## 2015-05-26 MED ORDER — PALONOSETRON HCL INJECTION 0.25 MG/5ML
INTRAVENOUS | Status: AC
  Filled 2015-05-26: qty 5

## 2015-05-26 MED ORDER — DOCETAXEL CHEMO INJECTION 160 MG/16ML
60.0000 mg/m2 | Freq: Once | INTRAVENOUS | Status: AC
  Administered 2015-05-26: 110 mg via INTRAVENOUS
  Filled 2015-05-26: qty 11

## 2015-05-26 MED ORDER — LORAZEPAM 2 MG/ML IJ SOLN
INTRAMUSCULAR | Status: AC
  Filled 2015-05-26: qty 1

## 2015-05-26 MED ORDER — SODIUM CHLORIDE 0.9 % IV SOLN
10.0000 mg | Freq: Once | INTRAVENOUS | Status: AC
  Administered 2015-05-26: 10 mg via INTRAVENOUS
  Filled 2015-05-26: qty 1

## 2015-05-26 MED ORDER — LORAZEPAM 1 MG PO TABS
ORAL_TABLET | ORAL | Status: AC
  Filled 2015-05-26: qty 1

## 2015-05-26 NOTE — Patient Instructions (Signed)
[facility] Discharge Instructions for Patients Receiving Chemotherapy  Today you received the following chemotherapy agents Taxotere.   To help prevent nausea and vomiting after your treatment, we encourage you to take your nausea medication.   If you develop nausea and vomiting that is not controlled by your nausea medication, call the clinic.   BELOW ARE SYMPTOMS THAT SHOULD BE REPORTED IMMEDIATELY:  *FEVER GREATER THAN 100.5 F  *CHILLS WITH OR WITHOUT FEVER  NAUSEA AND VOMITING THAT IS NOT CONTROLLED WITH YOUR NAUSEA MEDICATION  *UNUSUAL SHORTNESS OF BREATH  *UNUSUAL BRUISING OR BLEEDING  TENDERNESS IN MOUTH AND THROAT WITH OR WITHOUT PRESENCE OF ULCERS  *URINARY PROBLEMS  *BOWEL PROBLEMS  UNUSUAL RASH Items with * indicate a potential emergency and should be followed up as soon as possible.  Feel free to call the clinic you have any questions or concerns. The clinic phone number is [phone].  Please show the CHEMO ALERT CARD at check-in to the Emergency Department and triage nurse.   

## 2015-05-27 ENCOUNTER — Telehealth: Payer: Self-pay | Admitting: *Deleted

## 2015-05-27 NOTE — Telephone Encounter (Signed)
Call from [facility] with [facility]. Patient's wound has healed so much that she is not able to place would vac. States that 1/8" of tissue showing. She did wet to dry dressing change daily and patient's daughter has been taught how to do the dressing.

## 2015-05-28 ENCOUNTER — Ambulatory Visit (HOSPITAL_BASED_OUTPATIENT_CLINIC_OR_DEPARTMENT_OTHER): Payer: Self-pay

## 2015-05-28 VITALS — BP 152/80 | HR 75 | Temp 98.5°F | Resp 16

## 2015-05-28 DIAGNOSIS — C541 Malignant neoplasm of endometrium: Secondary | ICD-10-CM

## 2015-05-28 MED ORDER — PEGFILGRASTIM INJECTION 6 MG/0.6ML ~~LOC~~
6.0000 mg | PREFILLED_SYRINGE | Freq: Once | SUBCUTANEOUS | Status: AC
  Administered 2015-05-28: 6 mg via SUBCUTANEOUS

## 2015-05-28 NOTE — Patient Instructions (Signed)
Pegfilgrastim injection What is this medicine? PEGFILGRASTIM (PEG fil gra stim) is a long-acting granulocyte colony-stimulating factor that stimulates the growth of neutrophils, a type of white blood cell important in the body's fight against infection. It is used to reduce the incidence of fever and infection in patients with certain types of cancer who are receiving chemotherapy that affects the bone marrow, and to increase survival after being exposed to high doses of radiation. This medicine may be used for other purposes; ask your health care provider or pharmacist if you have questions. What should I tell my health care provider before I take this medicine? They need to know if you have any of these conditions: -kidney disease -latex allergy -ongoing radiation therapy -sickle cell disease -skin reactions to acrylic adhesives (On-Body Injector only) -an unusual or allergic reaction to pegfilgrastim, filgrastim, other medicines, foods, dyes, or preservatives -pregnant or trying to get pregnant -breast-feeding How should I use this medicine? This medicine is for injection under the skin. If you get this medicine at home, you will be taught how to prepare and give the pre-filled syringe or how to use the On-body Injector. Refer to the patient Instructions for Use for detailed instructions. Use exactly as directed. Take your medicine at regular intervals. Do not take your medicine more often than directed. It is important that you put your used needles and syringes in a special sharps container. Do not put them in a trash can. If you do not have a sharps container, call your pharmacist or healthcare provider to get one. Talk to your pediatrician regarding the use of this medicine in children. While this drug may be prescribed for selected conditions, precautions do apply. Overdosage: If you think you have taken too much of this medicine contact a poison control center or emergency room at  once. NOTE: This medicine is only for you. Do not share this medicine with others. What if I miss a dose? It is important not to miss your dose. Call your doctor or health care professional if you miss your dose. If you miss a dose due to an On-body Injector failure or leakage, a new dose should be administered as soon as possible using a single prefilled syringe for manual use. What may interact with this medicine? Interactions have not been studied. Give your health care provider a list of all the medicines, herbs, non-prescription drugs, or dietary supplements you use. Also tell them if you smoke, drink alcohol, or use illegal drugs. Some items may interact with your medicine. This list may not describe all possible interactions. Give your health care provider a list of all the medicines, herbs, non-prescription drugs, or dietary supplements you use. Also tell them if you smoke, drink alcohol, or use illegal drugs. Some items may interact with your medicine. What should I watch for while using this medicine? You may need blood work done while you are taking this medicine. If you are going to need a MRI, CT scan, or other procedure, tell your doctor that you are using this medicine (On-Body Injector only). What side effects may I notice from receiving this medicine? Side effects that you should report to your doctor or health care professional as soon as possible: -allergic reactions like skin rash, itching or hives, swelling of the face, lips, or tongue -dizziness -fever -pain, redness, or irritation at site where injected -pinpoint red spots on the skin -red or dark-brown urine -shortness of breath or breathing problems -stomach or side pain, or pain   at the shoulder -swelling -tiredness -trouble passing urine or change in the amount of urine Side effects that usually do not require medical attention (report to your doctor or health care professional if they continue or are  bothersome): -bone pain -muscle pain This list may not describe all possible side effects. Call your doctor for medical advice about side effects. You may report side effects to FDA at 1-800-FDA-1088. Where should I keep my medicine? Keep out of the reach of children. Store pre-filled syringes in a refrigerator between 2 and 8 degrees C (36 and 46 degrees F). Do not freeze. Keep in carton to protect from light. Throw away this medicine if it is left out of the refrigerator for more than 48 hours. Throw away any unused medicine after the expiration date. NOTE: This sheet is a summary. It may not cover all possible information. If you have questions about this medicine, talk to your doctor, pharmacist, or health care provider.    2016, Elsevier/Gold Standard. (2014-01-07 14:30:14)  

## 2015-06-01 ENCOUNTER — Telehealth: Payer: Self-pay

## 2015-06-01 ENCOUNTER — Other Ambulatory Visit: Payer: Self-pay | Admitting: Oncology

## 2015-06-01 MED ORDER — CYCLOBENZAPRINE HCL 5 MG PO TABS: 5.0000 mg | ORAL_TABLET | Freq: Three times a day (TID) | ORAL | Status: DC | PRN

## 2015-06-01 NOTE — Telephone Encounter (Signed)
[name] states that the pain from the shot is getting unbearable.  It is in her back,neck, and legs.  She hurts  for 1 week after the injection. She is taking Claritin.   Hot shower and heating pad provides minimal relief. Used MSIR 15 mg  and tylenol with no relief. She is requesting a muscle relaxer for the pain/spasms in back, neck, and legs.

## 2015-06-01 NOTE — Telephone Encounter (Signed)
Flexeril 5 mg q 8 hr prn #10 Should not take MSIR with this

## 2015-06-01 NOTE — Telephone Encounter (Addendum)
Called in flexeril and left message for [name] in her voicemail  In her cell phone as noted below by Dr. Marko Plume.

## 2015-06-05 ENCOUNTER — Other Ambulatory Visit: Payer: Self-pay | Admitting: Oncology

## 2015-06-07 ENCOUNTER — Ambulatory Visit: Payer: Self-pay | Admitting: General Surgery

## 2015-06-09 ENCOUNTER — Other Ambulatory Visit (HOSPITAL_BASED_OUTPATIENT_CLINIC_OR_DEPARTMENT_OTHER): Payer: Medicaid Other

## 2015-06-09 ENCOUNTER — Telehealth: Payer: Self-pay | Admitting: Oncology

## 2015-06-09 ENCOUNTER — Encounter: Payer: Self-pay | Admitting: Oncology

## 2015-06-09 ENCOUNTER — Ambulatory Visit (HOSPITAL_BASED_OUTPATIENT_CLINIC_OR_DEPARTMENT_OTHER): Payer: Medicaid Other | Admitting: Oncology

## 2015-06-09 VITALS — BP 124/63 | HR 78 | Temp 98.6°F | Resp 18 | Ht 62.0 in | Wt 172.9 lb

## 2015-06-09 DIAGNOSIS — C801 Malignant (primary) neoplasm, unspecified: Secondary | ICD-10-CM | POA: Diagnosis not present

## 2015-06-09 DIAGNOSIS — C778 Secondary and unspecified malignant neoplasm of lymph nodes of multiple regions: Secondary | ICD-10-CM

## 2015-06-09 DIAGNOSIS — Z7901 Long term (current) use of anticoagulants: Secondary | ICD-10-CM

## 2015-06-09 DIAGNOSIS — C7801 Secondary malignant neoplasm of right lung: Secondary | ICD-10-CM

## 2015-06-09 DIAGNOSIS — I898 Other specified noninfective disorders of lymphatic vessels and lymph nodes: Secondary | ICD-10-CM

## 2015-06-09 DIAGNOSIS — C7951 Secondary malignant neoplasm of bone: Secondary | ICD-10-CM | POA: Diagnosis not present

## 2015-06-09 DIAGNOSIS — C541 Malignant neoplasm of endometrium: Secondary | ICD-10-CM

## 2015-06-09 DIAGNOSIS — D649 Anemia, unspecified: Secondary | ICD-10-CM | POA: Diagnosis not present

## 2015-06-09 DIAGNOSIS — I10 Essential (primary) hypertension: Secondary | ICD-10-CM | POA: Diagnosis not present

## 2015-06-09 DIAGNOSIS — I82402 Acute embolism and thrombosis of unspecified deep veins of left lower extremity: Secondary | ICD-10-CM

## 2015-06-09 DIAGNOSIS — E876 Hypokalemia: Secondary | ICD-10-CM | POA: Diagnosis not present

## 2015-06-09 DIAGNOSIS — T451X5A Adverse effect of antineoplastic and immunosuppressive drugs, initial encounter: Secondary | ICD-10-CM

## 2015-06-09 DIAGNOSIS — C7802 Secondary malignant neoplasm of left lung: Secondary | ICD-10-CM

## 2015-06-09 DIAGNOSIS — G620 Drug-induced polyneuropathy: Secondary | ICD-10-CM

## 2015-06-09 DIAGNOSIS — D6481 Anemia due to antineoplastic chemotherapy: Secondary | ICD-10-CM

## 2015-06-09 DIAGNOSIS — C569 Malignant neoplasm of unspecified ovary: Secondary | ICD-10-CM

## 2015-06-09 LAB — COMPREHENSIVE METABOLIC PANEL
ALBUMIN: 3 g/dL — AB (ref 3.5–5.0)
ALT: <9 U/L (ref 0–55)
AST: 7 U/L (ref 5–34)
Alkaline Phosphatase: 135 U/L (ref 40–150)
Anion Gap: 10 mEq/L (ref 3–11)
BUN: 10.1 mg/dL (ref 7.0–26.0)
CHLORIDE: 103 meq/L (ref 98–109)
CO2: 26 meq/L (ref 22–29)
Calcium: 9.7 mg/dL (ref 8.4–10.4)
Creatinine: 1.1 mg/dL (ref 0.6–1.1)
EGFR: 64 mL/min/{1.73_m2} — ABNORMAL LOW (ref >90)
GLUCOSE: 112 mg/dL (ref 70–140)
POTASSIUM: 3.6 meq/L (ref 3.5–5.1)
SODIUM: 139 meq/L (ref 136–145)
Total Bilirubin: 0.4 mg/dL (ref 0.20–1.20)
Total Protein: 7 g/dL (ref 6.4–8.3)

## 2015-06-09 LAB — CBC WITH DIFFERENTIAL/PLATELET
BASO%: 0.4 % (ref 0.0–2.0)
BASOS ABS: 0.1 10*3/uL (ref 0.0–0.1)
EOS%: 0.7 % (ref 0.0–7.0)
Eosinophils Absolute: 0.1 10*3/uL (ref 0.0–0.5)
HCT: 30.2 % — ABNORMAL LOW (ref 34.8–46.6)
HEMOGLOBIN: 9.8 g/dL — AB (ref 11.6–15.9)
LYMPH%: 8.6 % — AB (ref 14.0–49.7)
MCH: 27.8 pg (ref 25.1–34.0)
MCHC: 32.5 g/dL (ref 31.5–36.0)
MCV: 85.8 fL (ref 79.5–101.0)
MONO#: 0.6 10*3/uL (ref 0.1–0.9)
MONO%: 4.3 % (ref 0.0–14.0)
NEUT#: 10.9 10*3/uL — ABNORMAL HIGH (ref 1.5–6.5)
NEUT%: 86 % — ABNORMAL HIGH (ref 38.4–76.8)
Platelets: 183 10*3/uL (ref 145–400)
RBC: 3.52 10*6/uL — AB (ref 3.70–5.45)
RDW: 17.4 % — AB (ref 11.2–14.5)
WBC: 12.7 10*3/uL — ABNORMAL HIGH (ref 3.9–10.3)
lymph#: 1.1 10*3/uL (ref 0.9–3.3)

## 2015-06-09 MED ORDER — DEXAMETHASONE 4 MG PO TABS: 8.0000 mg | ORAL_TABLET | Freq: Two times a day (BID) | ORAL | Status: DC

## 2015-06-09 MED ORDER — MORPHINE SULFATE 15 MG PO TABS: 15.0000 mg | ORAL_TABLET | ORAL | Status: DC | PRN

## 2015-06-09 NOTE — Telephone Encounter (Signed)
appt made and avs printed. CT to be scheduled by central radiology °

## 2015-06-09 NOTE — Progress Notes (Signed)
OFFICE PROGRESS NOTE   June 09, 2015   Physicians: E.Rossi, J.Kinard, C.Windham Verl Blalock, [facility]) New patient referral made to [facility]ness for PCP, not established yet.   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 She is clinically responding to present chemo (carbo taxotere for first 2 cycles, then taxotere alone thru cycle 5 05-24-15), tho still malignancy identified in path specimen from drainage procedure for necrotic left inguinal nodes.. She had severe aches x 9 days following last neulasta, really not tolerable.  Transfused 2 units PRBCs 05-24-15 for Hgb 7.8 She is on Eliquis for LLE DVT.  She felt much stronger after PRBCs on 05-24-15. She has felt fairly well since cycle 5 severe, prolonged neulasta aches resolved. Flexeril did not help aches and she did not tolerate that medication.  Peripheral neuropathy hands and feet does not seem worse since most recent treatment, tho feet are continuously numb from metatarsal heads distally, not significant in hands. She does not have difficulty walking as long as shoes are padded. The wound vac is off and the left groin area continues to heal, HH still following; Light dressings at left groin need change ~ 2x daily from continued serous drainage. Appetite is adequate, less nausea, no vomiting recently. Bowels are moving. Back discomfort noticeable only when she leans over for the time required to open a low drawer or pick something up from floor. No bleeding. No SOB or cough, no chest pain. Bladder ok.  Remainder of 10 point Review of Systems negative/ unchanged.   She continues Eliquis for LLE DVT  No central catheter Genetics testing will be done with labs day of upcoming CT, per my communication now with genetics counselor. CA 125 on 02-14-15  642.9 ER + on cytology 02-2015 Feraheme 02-16-15 and [id number].  Medicaid has been obtained, see above re genetics testing now. Family plans beach trip June 25 to July 2. We will coordinate treatments and visits to allow patient to do this.  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 She was lost to follow up for the gyn cancer after 11-2010, until presented with recurrent disease. Patient reported right inguinal mass for ~ 2 months, then LLE pain for ~ 3 weeks when she presented to ED on 02-12-15. CT AP showed nodules in lung bases suspicious for metastatic disease, liver not remarkable, no hydronephrosis, 6 mm right renal calculus, thrombus left external iliac vein into left common femoral vein, retroperitoneal and pelvic adenopathy, an 8.5 x 7.1 cm necrotic right inguinal node, post hyst ooph, no ascites, likely tumor involvement in left psoas with adjacent involvement of L4 and L5 vertebral bodies; CT chest subsequently had multiple bilateral pulmonary nodules and central adenopathy. Patient was begun on heparin qtt which was transitioned to Eliquis (on pharmaceutical assistance program). CA 125 from 02-14-15 was 642.9, this having been 3.6 in 08-2010. Pathology from right inguinal nodal mass aspiration and core needle biopsy 02-15-15 necrotic material with metastatic adenocarcinoma with immunostains consistent with gyn primary ([id number], ER+). She was transfused 2 units PRBCs for hgb 6.7. Iron studies 2-12 with serum iron 14  and %sat 7, given feraheme x2. Dr Denman George saw in consultation prior to starting carboplatin taxotere on 02-24-15. She reacted to [place] skin test prior to cycle 3,  treatment continued with taxotere only thru cycle 5 on 05-26-15, tolerated neulasta poorly. She had I&D and capsulectomy of seroma cavity right inguinal region on 05-12-15.    Objective:  Vital signs in last 24 hours:  BP 124/63 mmHg  Pulse 78  Temp(Src) 98.6 F (37 C) (Oral)  Resp 18  Ht _0  (1.575 m)  Wt 172 lb 14.4 oz (78.427 kg)  BMI 31.62 kg/m2  SpO2 99% Weight down 2 lbs. Alert, oriented and appropriate. Ambulatory without assistance. Neatly dressed as always, wearing shoes with pointed toes. Alopecia  HEENT:PERRL, sclerae not icteric. Oral mucosa moist without lesions, posterior pharynx clear.  Neck supple. No JVD.  Lymphatics:no supraclavicular adenopathy Resp: clear to auscultation bilaterally and normal percussion bilaterally Cardio: regular rate and rhythm. No gallop. GI: soft, nontender, not distended, no mass or organomegaly. Normally active bowel sounds. Surgical incision not remarkable. Right inguinal area with gauze dressing intact, not saturated, no erythema or tenderness around this.  Musculoskeletal/ Extremities: LE without pitting edema, cords, tenderness Neuro: peripheral neuropathy distal feet as noted. Otherwise nonfocal. PSYCH appropriate mood and affect Skin without rash, ecchymosis, petechiae   Lab Results:  Results for orders placed or performed in visit on 06/09/15  CBC with Differential  Result Value Ref Range   WBC 12.7 (H) 3.9 - 10.3 10e3/uL   NEUT# 10.9 (H) 1.5 - 6.5 10e3/uL   HGB 9.8 (L) 11.6 - 15.9 g/dL   HCT 30.2 (L) 34.8 - 46.6 %   Platelets 183 145 - 400 10e3/uL   MCV 85.8 79.5 - 101.0 fL   MCH 27.8 25.1 - 34.0 pg   MCHC 32.5 31.5 - 36.0 g/dL   RBC 3.52 (L) 3.70 - 5.45 10e6/uL   RDW 17.4 (H) 11.2 - 14.5 %   lymph# 1.1 0.9 - 3.3 10e3/uL   MONO# 0.6 0.1 - 0.9 10e3/uL   Eosinophils Absolute 0.1 0.0 - 0.5 10e3/uL   Basophils Absolute 0.1 0.0 - 0.1 10e3/uL   NEUT% 86.0 (H) 38.4 - 76.8 %   LYMPH% 8.6 (L) 14.0 - 49.7 %   MONO% 4.3 0.0  - 14.0 %   EOS% 0.7 0.0 - 7.0 %   BASO% 0.4 0.0 - 2.0 %  Comprehensive metabolic panel  Result Value Ref Range   Sodium 139 136 - 145 mEq/L   Potassium 3.6 3.5 - 5.1 mEq/L   Chloride 103 98 - 109 mEq/L   CO2 26 22 - 29 mEq/L   Glucose 112 70 - 140 mg/dl   BUN 10.1 7.0 - 26.0 mg/dL   Creatinine 1.1 0.6 - 1.1 mg/dL   Total Bilirubin 0.40 0.20 - 1.20 mg/dL   Alkaline Phosphatase 135 40 - 150 U/L   AST 7 5 - 34 U/L   ALT <9 0 - 55 U/L   Total Protein 7.0 6.4 - 8.3 g/dL   Albumin 3.0 (L) 3.5 - 5.0 g/dL   Calcium 9.7 8.4 - 10.4 mg/dL   Anion Gap 10 3 - 11 mEq/L   EGFR 64 (L) >90 ml/min/1.73 m2   CA 125 from 05-23-15 was 48  Studies/Results: CT CAP ordered, requested ~ 06-14-15.  Discussed path from seroma I&D still with cancer apparent.  Medications: I have reviewed the patient's current medications. Consider gabapentin etc for neuropathy if needed, but may improve just off of  taxane.  DISCUSSION Tho still seems to be benefit clinically from disease standpoint with taxotere, the aches from medically necessary neulasta and peripheral neuropathy are now limiting. She had reaction to [place] skin test after cycle 2 (previously 6 cycles adjuvant taxol carbo 2010).  Will cancel #6 taxotere planned 06-16-15/ cancel neulasta 06-18-15. Will get genetics testing sent, as this may give other options for treatment. Will avoid neulasta as possible from here. Peripheral IV access is not always easy. Would need to be off Eliquis x 48 hrs for PAC by IR  Patient an family understand that treatment is in attempt to control disease but will not be curative. QOL and events such as the beach trip high priorily. She knows not to get in ocean. Family expresses appreciation for plans as above.  May need to speak with her by phone after scans, as she will be away 6-25 thru 7-2.  She preferred next MD apt 7-6 rather than 7-3.   Treatment considerations, not discussed now: Right inguinal surgical procedure 05-12-15  and not yet fully healed, pertinent for avastin timing. Oral etoposide, doxil (would need PAC) +/- avastin (see above), gemzar (would need PAC) +/- avastin, letrozole, topotecan. PARP inhibitors depending on BRCA status; niraparib.     Assessment/Plan: 1.metastatic adenocarcinoma of gyn primary, in patient with history of IC clear cell ovarian and IA high grade endometrial carcinomas 03-2008 (incomplete staging), post TAH BSO, adjuvant carboplatin taxol x 6 cycles and radiation, with significant residual taxol neuropathy in feet. Present metastatic disease involves large necrotic right inguinal node, paraaortic/ aortocaval/ external iliac nodes, left psoas into L4-5 and multiple bilateral pulmonary mets. Back pain well controlled with pain med ~ 1x daily; no zometa thus far. Clinically much improved but not tolerating taxotere/ neulasta now. Will repeat CT CAP and consider other options. Genetics testing scheduled.  2. Post I&D with capsulectomy of seroma cavity 05-12-15, wound vac off, improving, still serous drainage which is more tolerable with dressings now than prior to the procedure. Still adenocarcinoma in that path. 3.LLE DVT documented 02-12-15: on Eliquis, no longer symptomatic. No IVC filter. Eliquis approved thru [employer] assistance x 1 year.  4. Progressive anemia: improved with PRBCs. Ppost feraheme and continues oral iron. Has not yet done hemoccult cards despite several orders for these. 5. chemo nausea and vomiting improved with aloxi and protonix, continue. PO intake improved. 6.uncontrolled HTN initially: on labetalol 200 mg tid per urgent care + clonidine 0.1 mg tid and HCTZ. BP much better now. Still needs PCP to manage this 7.carbo skin test reaction cycle 3.  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 Stable or slightly progressive, feet > hands. 10.Social: Medicaid  approved 11.difficult IV access not easy tho still manageable. Would need to be off Eliquis x 48 hrs for PAC by IR. 12.overdue mammograms, general medical care, colonoscopy. Noncompliance with follow up after cancer diagnoses and adjuvant treatment after ~ 2012. Noncompliant with care for HTN over past several years, despite family efforts ( daughter is pediatrician) Needs PCP but has not followed thru with referral to [facility] clinic. 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 Repeat colonoscopy recommended 10-2009, apparently not done. Note iron deficiency. FOB not yet done, see above. 14.hypokalemia: continue supplement, follow   All questions answered and she knows to call if needed prior to next scheduled visit. Coordination for genetics labs day of CT; depending on timing of CT I will try to  speak with her, otherwise will discuss options at return visit (by which time we may have genetics information also). Time spent 30 min including >50% counseling and coordination of care.     Cyndie Woodbeck P, MD   06/09/2015, 3:16 PM

## 2015-06-11 DIAGNOSIS — D6481 Anemia due to antineoplastic chemotherapy: Secondary | ICD-10-CM | POA: Insufficient documentation

## 2015-06-11 DIAGNOSIS — T451X5A Adverse effect of antineoplastic and immunosuppressive drugs, initial encounter: Secondary | ICD-10-CM

## 2015-06-14 ENCOUNTER — Encounter (HOSPITAL_COMMUNITY): Payer: Self-pay

## 2015-06-14 ENCOUNTER — Other Ambulatory Visit: Payer: Self-pay

## 2015-06-14 ENCOUNTER — Ambulatory Visit (HOSPITAL_COMMUNITY)
Admission: RE | Admit: 2015-06-14 | Discharge: 2015-06-14 | Disposition: A | Discharge status: Home / Self Care | Payer: Medicaid Other | Source: Ambulatory Visit | Attending: Oncology | Admitting: Oncology

## 2015-06-14 DIAGNOSIS — C541 Malignant neoplasm of endometrium: Secondary | ICD-10-CM | POA: Diagnosis present

## 2015-06-14 DIAGNOSIS — R590 Localized enlarged lymph nodes: Secondary | ICD-10-CM | POA: Diagnosis not present

## 2015-06-14 DIAGNOSIS — R918 Other nonspecific abnormal finding of lung field: Secondary | ICD-10-CM | POA: Insufficient documentation

## 2015-06-14 DIAGNOSIS — C7951 Secondary malignant neoplasm of bone: Secondary | ICD-10-CM | POA: Diagnosis not present

## 2015-06-14 DIAGNOSIS — I10 Essential (primary) hypertension: Secondary | ICD-10-CM

## 2015-06-14 MED ORDER — IOPAMIDOL (ISOVUE-300) INJECTION 61%
100.0000 mL | Freq: Once | INTRAVENOUS | Status: AC | PRN
  Administered 2015-06-14: 100 mL via INTRAVENOUS

## 2015-06-14 MED ORDER — LABETALOL HCL 100 MG PO TABS
200.0000 mg | ORAL_TABLET | Freq: Three times a day (TID) | ORAL | Status: DC
Start: 2015-06-14 — End: 2015-07-12

## 2015-06-15 ENCOUNTER — Telehealth: Payer: Self-pay

## 2015-06-15 ENCOUNTER — Other Ambulatory Visit: Payer: Self-pay | Admitting: Oncology

## 2015-06-15 ENCOUNTER — Telehealth: Payer: Self-pay | Admitting: Oncology

## 2015-06-15 DIAGNOSIS — R918 Other nonspecific abnormal finding of lung field: Secondary | ICD-10-CM

## 2015-06-15 DIAGNOSIS — C569 Malignant neoplasm of unspecified ovary: Secondary | ICD-10-CM

## 2015-06-15 NOTE — Telephone Encounter (Signed)
Medical Oncology  I spoke with interventional radiologist regarding CT CAP done 06-14-15, which shows improvement in all areas other than in cystic lesion in left hilar area which is at least closely adjacent to pericardium and does have some nodular component.  Interestingly, this is somewhat similar to the right groin cystic area that developed in necrotic nodal mass, I&D done there recently (still viable tumor on that path).  IR feels this may be best approached by thoracic surgery, which would be appropriate particularly if she is symptomatic, but also fact that area is enlarging would be reasonable to consult. If thoracic surgery feels IR best to approach, IR would be glad to consider.   I have LM for patient to call back so that I can discuss scan information with her.  Godfrey Pick, MD

## 2015-06-15 NOTE — Telephone Encounter (Signed)
Any of the surgeons with [facility] - Magdalene Molly, Rexanne Mano. I have put in EPIC referral now - RN please either track it or call that office to confirm, best if we can let patient know apt date before she leaves town thanks

## 2015-06-15 NOTE — Telephone Encounter (Signed)
-----   Message from Gordy Levan, MD sent at 06/15/2015 12:43 PM EDT ----- I tried to [facility] for patient on cell [phone] now, asked that she call back to office to discuss scans done 06-14-15.  If she returns call to RN, please let her know that all areas on scans have improved in chest , abdomen and pelvis with chemo, with exception of a partially cystic area in left chest which has gotten a bit larger. This area may be similar to the tumor cyst that she had in right groin.  Is she having any chest discomfort or SOB? I have spoken with interventional radiology, who feel it would be best to get chest surgeon to look at this in case some drainage procedure is needed.  I am glad to speak with her if needed. We can make referral to thoracic surgery for consultation if she agrees - I will need to call to get it more promptly if she is having symptoms.  (I know she has beach trip June 25 - July 2) Need to call her again today or tomorrow  if we do not hear back thanks

## 2015-06-15 NOTE — Telephone Encounter (Signed)
S/w pt and she is willing to go forward with referral. She is having no symptoms and would prefer an appt after her beach trip. She will call if she develops symptoms or if she needs to talk directly with Dr Marko Plume. Order placement left for Dr Marko Plume for specific MD selection.

## 2015-06-16 ENCOUNTER — Ambulatory Visit: Payer: Self-pay

## 2015-06-16 ENCOUNTER — Other Ambulatory Visit (HOSPITAL_BASED_OUTPATIENT_CLINIC_OR_DEPARTMENT_OTHER): Payer: Medicaid Other

## 2015-06-16 DIAGNOSIS — C541 Malignant neoplasm of endometrium: Secondary | ICD-10-CM

## 2015-06-16 DIAGNOSIS — C801 Malignant (primary) neoplasm, unspecified: Secondary | ICD-10-CM | POA: Diagnosis not present

## 2015-06-16 LAB — COMPREHENSIVE METABOLIC PANEL
ALT: <9 U/L (ref 0–55)
Albumin: 3 g/dL — ABNORMAL LOW (ref 3.5–5.0)
Alkaline Phosphatase: 108 U/L (ref 40–150)
Anion Gap: 7 mEq/L (ref 3–11)
BUN: 6.6 mg/dL — AB (ref 7.0–26.0)
CALCIUM: 9.8 mg/dL (ref 8.4–10.4)
CHLORIDE: 104 meq/L (ref 98–109)
CO2: 27 mEq/L (ref 22–29)
Creatinine: 1 mg/dL (ref 0.6–1.1)
EGFR: 71 mL/min/{1.73_m2} — ABNORMAL LOW (ref >90)
Glucose: 112 mg/dl (ref 70–140)
POTASSIUM: 3.5 meq/L (ref 3.5–5.1)
SODIUM: 138 meq/L (ref 136–145)
Total Bilirubin: 0.45 mg/dL (ref 0.20–1.20)
Total Protein: 6.9 g/dL (ref 6.4–8.3)

## 2015-06-16 LAB — CBC WITH DIFFERENTIAL/PLATELET
BASO%: 0.8 % (ref 0.0–2.0)
BASOS ABS: 0.1 10*3/uL (ref 0.0–0.1)
EOS%: 1 % (ref 0.0–7.0)
Eosinophils Absolute: 0.1 10*3/uL (ref 0.0–0.5)
HEMATOCRIT: 30.7 % — AB (ref 34.8–46.6)
HGB: 9.9 g/dL — ABNORMAL LOW (ref 11.6–15.9)
LYMPH%: 9.6 % — AB (ref 14.0–49.7)
MCH: 27.7 pg (ref 25.1–34.0)
MCHC: 32.4 g/dL (ref 31.5–36.0)
MCV: 85.6 fL (ref 79.5–101.0)
MONO#: 0.9 10*3/uL (ref 0.1–0.9)
MONO%: 10.1 % (ref 0.0–14.0)
NEUT#: 6.8 10*3/uL — ABNORMAL HIGH (ref 1.5–6.5)
NEUT%: 78.5 % — AB (ref 38.4–76.8)
Platelets: 367 10*3/uL (ref 145–400)
RBC: 3.58 10*6/uL — AB (ref 3.70–5.45)
RDW: 17.3 % — ABNORMAL HIGH (ref 11.2–14.5)
WBC: 8.7 10*3/uL (ref 3.9–10.3)
lymph#: 0.8 10*3/uL — ABNORMAL LOW (ref 0.9–3.3)

## 2015-06-17 ENCOUNTER — Other Ambulatory Visit: Payer: Self-pay | Admitting: Oncology

## 2015-06-17 NOTE — Telephone Encounter (Signed)
Cindy callec from [facility] an stated that Ms Greg has an appointment on 07-06-15 with Dr. Prescott Gum at 1230.  Jenny Reichmann will call and inform patient of appointment date and time.

## 2015-06-17 NOTE — Telephone Encounter (Signed)
Spoke with Jenny Reichmann at [employer].  The referral was not in their work que., She graciously assisted this nurse to edit referral. Referral in work que now.  She will call the patient with appointment and let Dr. Mariana Kaufman nurse know appointment time as well. Told Jenny Reichmann that Pt will be out of town 6-25 thru 07-03-15.

## 2015-06-18 ENCOUNTER — Ambulatory Visit: Payer: Self-pay

## 2015-07-06 ENCOUNTER — Institutional Professional Consult (permissible substitution) (INDEPENDENT_AMBULATORY_CARE_PROVIDER_SITE_OTHER): Payer: Medicaid Other | Admitting: Cardiothoracic Surgery

## 2015-07-06 ENCOUNTER — Other Ambulatory Visit: Payer: Self-pay | Admitting: Oncology

## 2015-07-06 ENCOUNTER — Encounter: Payer: Self-pay | Admitting: Cardiothoracic Surgery

## 2015-07-06 ENCOUNTER — Other Ambulatory Visit: Payer: Self-pay | Admitting: *Deleted

## 2015-07-06 VITALS — BP 175/105 | HR 103 | Resp 16 | Ht 62.0 in | Wt 172.0 lb

## 2015-07-06 DIAGNOSIS — Z8543 Personal history of malignant neoplasm of ovary: Secondary | ICD-10-CM

## 2015-07-06 DIAGNOSIS — R222 Localized swelling, mass and lump, trunk: Secondary | ICD-10-CM | POA: Diagnosis not present

## 2015-07-06 DIAGNOSIS — Z8542 Personal history of malignant neoplasm of other parts of uterus: Secondary | ICD-10-CM | POA: Diagnosis not present

## 2015-07-06 DIAGNOSIS — R918 Other nonspecific abnormal finding of lung field: Secondary | ICD-10-CM

## 2015-07-06 DIAGNOSIS — C541 Malignant neoplasm of endometrium: Secondary | ICD-10-CM

## 2015-07-06 NOTE — Progress Notes (Signed)
PCP is No PCP Per Patient Referring Provider is Gordy Levan, MD  Chief Complaint  Patient presents with  . Lung Mass    HILAR.Marland KitchenMarland KitchenCT CHEST/A/P  06/14/15.Marland KitchenMarland KitchenH/O Metastic gyn cancer    HPI: Patient presents for evaluation of enlarging cystic mass in the left hilum which is contiguous with a chronic 5 cm lung mass-pulmonary metastases from stage IV ovarian cancer.  The patient was treated for advanced ovarian cancer several years ago with a combination chemotherapy and radiation. In February of this year she had recurrence with a cystic mass excised from the right inguinal lymph node station positive for ovarian cancer. Serial scans since then has shown reduction and a left upper lobe solid mass but with increased size of an adjacent cystic mass in the AP window region now measuring almost 7 cm. This is fairly asymptomatic with only minimal intermittent left anterior pleuritic pain and a dry cough. The patient is a non-smoker. The patient's oncologist wishes this to be further evaluated and sampled to determine if this is related to her ovarian cancer or a new bronchogenic tumor.  The cystic mass does not appear to be approachable via bronchoscopy or mediastinoscopy. It would require a percutaneous drainage by IR or a left anterior chest approach- Chamberlain procedure.   Past Medical History  Diagnosis Date  . History of radiation therapy 10/21/08-11/23/08 & 12/07/10/13/12/23 2010    ENDOMETRIOID   . Hypertension   . Iron deficiency anemia     chronic severe  . Chemotherapy-induced neuropathy ([place])   . History of adenomatous polyp of colon     tubulovillious adenoma 09/ 2010  . Epithelial ovarian cancer, FIGO stage IVB ([place])   . Inguinal fluid collection 02/2015    right-drained x2  . History of ovarian cyst     complex  . History of uterine fibroid   . Family history of breast cancer   . DVT, lower extremity ([place])     left  02-12-2015  currently on Eliqius  . Endometrial cancer  [facility]) oncologist-  dr Marko Plume    02-2015  . Metastasis to lung ([place])   . Metastasis to lymph nodes [facility])     Past Surgical History  Procedure Laterality Date  . Tubal ligation  1990's  . Colonoscopy w/ polypectomy  09-27-2008  . Exploratory laparotomy /  total abdominal hysterectomy/ bilateral salpinoophorectomy/  partial omentectomy  03-18-2008  . Umbilical hernia repair      infant  . Abdominal hysterectomy    . Porta cath  06/2010    removal  . Inguinal hernia repair Right 05/12/2015    Procedure: INCISION AND DRAINGE RIGHT INGUINAL FLUID COLLECTION ;  Surgeon: Hall Busing, MD;  Location: [facility];  Service: General;  Laterality: Right;  . Application of wound vac Right 05/12/2015    Procedure: APPLICATION OF WOUND VAC;  Surgeon: Hall Busing, MD;  Location: [facility];  Service: General;  Laterality: Right;    Family History  Problem Relation Age of Onset  . Bone cancer Paternal Grandmother     OSSEOUS METASTASIS  . Heart disease Father   . Heart disease Sister   . Bone cancer Maternal Grandmother 75  . Leukemia Maternal Grandfather 6  . Liver cancer Maternal Uncle   . Breast cancer Cousin 24    maternal first cousin    Social History Social History  Substance Use Topics  . Smoking status: Never Smoker   . Smokeless tobacco: Never Used  .  Alcohol Use: No    Current Outpatient Prescriptions  Medication Sig Dispense Refill  . apixaban (ELIQUIS) 5 MG TABS tablet Take 1 tablet (5 mg total) by mouth 2 (two) times daily. 60 tablet 0  . cloNIDine (CATAPRES) 0.1 MG tablet Take 0.1 mg by mouth daily.     Marland Kitchen dexamethasone (DECADRON) 4 MG tablet Take 2 tablets (8 mg total) by mouth 2 (two) times daily. For 3 days.  Start the day before Taxotere. 12 tablet 1  . ferrous sulfate 325 (65 FE) MG tablet Take 1 tablet (325 mg total) by mouth 2 (two) times daily with a meal. 60 tablet 3  . hydrochlorothiazide (HYDRODIURIL) 25 MG tablet Take 25 mg  by mouth daily.      Marland Kitchen labetalol (NORMODYNE) 100 MG tablet Take 2 tablets (200 mg total) by mouth 3 (three) times daily. 180 tablet 0  . loratadine (CLARITIN) 10 MG tablet Take 10 mg by mouth daily.    Marland Kitchen LORazepam (ATIVAN) 0.5 MG tablet Place 1 tablet under the tongue or swallow every 6 hrs as needed for nausea. 30 tablet 0  . morphine (MSIR) 15 MG tablet Take 1 tablet (15 mg total) by mouth every 4 (four) hours as needed for moderate pain or severe pain. 20 tablet 0  . pantoprazole (PROTONIX) 40 MG tablet Take 1 tablet (40 mg total) by mouth daily. 30 tablet 2  . polyethylene glycol (MIRALAX / GLYCOLAX) packet Take 17 g by mouth 2 (two) times daily as needed. Reported on 05/23/2015    . potassium chloride (K-DUR) 10 MEQ tablet Take 2 tablets today and then 1 tablet daily 30 tablet 1  . zolpidem (AMBIEN) 5 MG tablet Take 1 tablet (5 mg total) by mouth at bedtime as needed for sleep. 30 tablet 2  . prochlorperazine (COMPAZINE) 10 MG tablet Take 1 tablet (10 mg total) by mouth every 6 (six) hours as needed (Nausea or vomiting). (Patient not taking: Reported on 07/06/2015) 30 tablet 1   No current facility-administered medications for this visit.    No Known Allergies  Review of Systems         Review of Systems :  [ y ] = yes, [  ] = no        General :  Weight gain [ 2 pounds  ]    Weight loss  [   ]  Fatigue [  ]  Fever [  ]  Chills  [  ]                                Weakness  [  ]           HEENT    Headache [  ]  Dizziness [  ]  Blurred vision [  ] Glaucoma  [  ]                          Nosebleeds [  ] Painful or loose teeth [  ]        Cardiac :  Chest pain/ pressure [mild left anterior  ]  Resting SOB [  ] exertional SOB [  ]                        Orthopnea [  ]  Pedal edema  [  ]  Palpitations [  ]  Syncope/presyncope [ ]                         Paroxysmal nocturnal dyspnea [  ]         Pulmonary : cough [dry cough, mild  ]  wheezing [  ]  Hemoptysis [  ] Sputum [  ] Snoring [  ]                               Pneumothorax [  ]  Sleep apnea [  ]        GI : Vomiting [  ]  Dysphagia [  ]  Melena  [  ]  Abdominal pain [  ] BRBPR [  ]              Heart burn [  ]  Constipation [  ] Diarrhea  [  ] Colonoscopy [   ]        GU : Hematuria [  ]  Dysuria [  ]  Nocturia [  ] UTI's [  ]        Vascular : Claudication [  ]  Rest pain [  ]  DVT [  ] Vein stripping [  ] leg ulcers [  ]                          TIA [  ] Stroke [  ]  Varicose veins [  ]        NEURO :  Headaches  [  ] Seizures [  ] Vision changes [  ] Paresthesias [  ]                                       Seizures [  ]        Musculoskeletal :  Arthritis [ yes related to chemotherapy-Neulasta ] Gout  [  ]  Back pain [  ]  Joint pain [  ]        Skin :  Rash [  ]  Melanoma [  ] Sores [  ]        Heme : Bleeding problems [  ]Clotting Disorders [  yes history DVT left leg started on Eliquis 5 months ago] Anemia [yes  ]Blood Transfusion [place] ]        Endocrine : Diabetes [  ] Heat or Cold intolerance [  ] Polyuria [  ]excessive thirst [ ]         Psych : Depression [  ]  Anxiety [  ]  Psych hospitalizations [  ] Memory change [  ]         No previous thoracic surgery or history of thoracic trauma-pneumothorax                                      BP 175/105 mmHg  Pulse 103  Resp 16  Ht 5\' 2"  (1.575 m)  Wt 172 lb (78.019 kg)  BMI 31.45 kg/m2  SpO2 98% Physical Exam     Physical Exam  General: Very pleasant well-nourished middle-aged AA female no distress  HEENT: Normocephalic pupils equal , dentition adequate Neck:  Supple without JVD, adenopathy, or bruit Chest: Clear to auscultation, symmetrical breath sounds, no rhonchi, no tenderness             or deformity Cardiovascular: Regular rate and rhythm, no murmur, no gallop, peripheral pulses             palpable in all extremities Abdomen:  Soft, nontender, no palpable mass or organomegaly Extremities: Warm, well-perfused, no clubbing cyanosis edema or  tenderness,              no venous stasis changes of the legs Rectal/GU: Deferred Neuro: Grossly non--focal and symmetrical throughout Skin: Clean and dry without rash or ulceration   Diagnostic Tests: CT scan performed last month personally reviewed and counseled with patient.   the cystic mass appears to be mediastinal and is probably related to her history is ovarian cancer. The mass is contiguous with the pulmonary artery bifurcation and extends anteriorly towards the chest wall.  Impression: Enlarging left hilar-mediastinal cystic mass with no history of treated advanced stage ovarian adenocarcinoma   Plan:Prior to deciding the approach to drain-excise the lesion will obtain a PET scan to determine l malignant potential. If cold on PET scan  then observation or simple transthoracic aspiration would be appropriate. We'll also check echocardiogram. She will return in 1-2 weeks to review the above studies   Len Childs, MD [facility] [phone]

## 2015-07-07 ENCOUNTER — Telehealth: Payer: Self-pay | Admitting: Genetic Counselor

## 2015-07-07 ENCOUNTER — Telehealth: Payer: Self-pay | Admitting: Oncology

## 2015-07-07 ENCOUNTER — Ambulatory Visit (HOSPITAL_BASED_OUTPATIENT_CLINIC_OR_DEPARTMENT_OTHER): Payer: Medicaid Other

## 2015-07-07 ENCOUNTER — Encounter: Payer: Self-pay | Admitting: Genetic Counselor

## 2015-07-07 ENCOUNTER — Encounter: Payer: Self-pay | Admitting: Oncology

## 2015-07-07 ENCOUNTER — Ambulatory Visit (HOSPITAL_BASED_OUTPATIENT_CLINIC_OR_DEPARTMENT_OTHER): Payer: Medicaid Other | Admitting: Oncology

## 2015-07-07 VITALS — BP 186/90 | HR 96 | Temp 98.7°F | Resp 18 | Ht 62.0 in | Wt 171.8 lb

## 2015-07-07 DIAGNOSIS — C7951 Secondary malignant neoplasm of bone: Secondary | ICD-10-CM

## 2015-07-07 DIAGNOSIS — C778 Secondary and unspecified malignant neoplasm of lymph nodes of multiple regions: Secondary | ICD-10-CM | POA: Diagnosis not present

## 2015-07-07 DIAGNOSIS — C7801 Secondary malignant neoplasm of right lung: Secondary | ICD-10-CM | POA: Diagnosis not present

## 2015-07-07 DIAGNOSIS — G893 Neoplasm related pain (acute) (chronic): Secondary | ICD-10-CM

## 2015-07-07 DIAGNOSIS — I82402 Acute embolism and thrombosis of unspecified deep veins of left lower extremity: Secondary | ICD-10-CM | POA: Diagnosis not present

## 2015-07-07 DIAGNOSIS — E876 Hypokalemia: Secondary | ICD-10-CM

## 2015-07-07 DIAGNOSIS — G620 Drug-induced polyneuropathy: Secondary | ICD-10-CM

## 2015-07-07 DIAGNOSIS — C7802 Secondary malignant neoplasm of left lung: Secondary | ICD-10-CM

## 2015-07-07 DIAGNOSIS — Z7901 Long term (current) use of anticoagulants: Secondary | ICD-10-CM

## 2015-07-07 DIAGNOSIS — I10 Essential (primary) hypertension: Secondary | ICD-10-CM

## 2015-07-07 DIAGNOSIS — Z1379 Encounter for other screening for genetic and chromosomal anomalies: Secondary | ICD-10-CM | POA: Insufficient documentation

## 2015-07-07 DIAGNOSIS — C541 Malignant neoplasm of endometrium: Secondary | ICD-10-CM

## 2015-07-07 DIAGNOSIS — D649 Anemia, unspecified: Secondary | ICD-10-CM

## 2015-07-07 DIAGNOSIS — C801 Malignant (primary) neoplasm, unspecified: Secondary | ICD-10-CM

## 2015-07-07 DIAGNOSIS — C569 Malignant neoplasm of unspecified ovary: Secondary | ICD-10-CM

## 2015-07-07 LAB — CBC WITH DIFFERENTIAL/PLATELET
BASO%: 0.4 % (ref 0.0–2.0)
Basophils Absolute: 0 10*3/uL (ref 0.0–0.1)
EOS ABS: 0.3 10*3/uL (ref 0.0–0.5)
EOS%: 4 % (ref 0.0–7.0)
HEMATOCRIT: 27.7 % — AB (ref 34.8–46.6)
HEMOGLOBIN: 9.1 g/dL — AB (ref 11.6–15.9)
LYMPH%: 12.4 % — ABNORMAL LOW (ref 14.0–49.7)
MCH: 27.2 pg (ref 25.1–34.0)
MCHC: 32.9 g/dL (ref 31.5–36.0)
MCV: 82.9 fL (ref 79.5–101.0)
MONO#: 0.7 10*3/uL (ref 0.1–0.9)
MONO%: 10 % (ref 0.0–14.0)
NEUT#: 5 10*3/uL (ref 1.5–6.5)
NEUT%: 73.2 % (ref 38.4–76.8)
PLATELETS: 297 10*3/uL (ref 145–400)
RBC: 3.34 10*6/uL — ABNORMAL LOW (ref 3.70–5.45)
RDW: 15.5 % — ABNORMAL HIGH (ref 11.2–14.5)
WBC: 6.8 10*3/uL (ref 3.9–10.3)
lymph#: 0.8 10*3/uL — ABNORMAL LOW (ref 0.9–3.3)

## 2015-07-07 LAB — COMPREHENSIVE METABOLIC PANEL
ALBUMIN: 2.9 g/dL — AB (ref 3.5–5.0)
ALT: <9 U/L (ref 0–55)
ANION GAP: 11 meq/L (ref 3–11)
AST: 7 U/L (ref 5–34)
Alkaline Phosphatase: 106 U/L (ref 40–150)
BUN: 7.7 mg/dL (ref 7.0–26.0)
CALCIUM: 9.6 mg/dL (ref 8.4–10.4)
CO2: 25 meq/L (ref 22–29)
Chloride: 104 mEq/L (ref 98–109)
Creatinine: 0.9 mg/dL (ref 0.6–1.1)
EGFR: 77 mL/min/{1.73_m2} — AB (ref >90)
GLUCOSE: 106 mg/dL (ref 70–140)
POTASSIUM: 3.2 meq/L — AB (ref 3.5–5.1)
SODIUM: 140 meq/L (ref 136–145)
Total Bilirubin: 0.4 mg/dL (ref 0.20–1.20)
Total Protein: 7.4 g/dL (ref 6.4–8.3)

## 2015-07-07 NOTE — Telephone Encounter (Signed)
appt made and avs printed °

## 2015-07-07 NOTE — Progress Notes (Signed)
OFFICE PROGRESS NOTE   July 09, 2015   Physicians: E.Rossi, J.Kinard, C.Windham, P.Lucianne Lei Trigt  [name], Lisa GI) New patient referral made to [facility] for PCP, not established yet.   INTERVAL HISTORY:  Patient is seen, together with daughter and mother, in continuing attention to extensively metastatic gyn carcinoma involving  bone, chest, lungs and nodes. She has been treated for this metastatic disease with carbo taxotere x 2 cycles beginning 02-24-15, then taxotere alone for additional 3 cycles thru 05-26-15.  CT CAP 06-14-15 showed improvement in all areas, with exception of increasing size of a large cystic mass in left hilum. (Note also large cystic, necrotic nodal mass in right groin requiring open drainage 05-12-15. Active malignancy found in that path specimen).   Patient had consultation with Dr Prescott Gum on 07-06-15. He has scheduled PET and echocardiogram for 07-19-15 and will see her back on 07-20-15. Per his note, consider observation or trans throracic aspiration if the left hilar mass is not hypermetabolic.    Patient enjoyed beach trip with lots of family June 25 to July 2. She has some ongoing fatigue and appetite only fair with some nausea even now. She had more back pain radiating up and down right back after 3+ hour car ride home yesterday, resolved with prn pain medication x1 and no discomfort today. No new or different pain otherwise. Severe aches from taxotere/ neulasta have resolved. She has no increased neuropathy in fingers or feet. No SOB walking into office now. No chest pain, no difficulty swallowing, no cough. No fever or symptoms of infection. No bleeding on Eliquis. No LE swelling or pain. Bowels ok. Right groin superficial open area ~ 0.5 cm, no drainage. HH to complete in next few days.  Peripheral IV access has been more difficult.  She continues Eliquis for LLE DVT  No central catheter Genetics testing negative Breast Ovarian and gyn  panel by [facility]  06-14-15.  CA 125 on 02-14-15 642.9 ER + on cytology 02-2015 Feraheme 02-16-15 and [id number].  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 She was lost to follow up for the gyn cancer after 11-2010, until presented with recurrent disease. Patient reported right inguinal mass for ~ 2 months, then LLE pain for ~ 3 weeks when she presented to ED on 02-12-15. CT AP showed nodules in lung bases suspicious for metastatic disease, liver not remarkable, no hydronephrosis, 6 mm right renal calculus, thrombus left external iliac vein into left common femoral vein, retroperitoneal and pelvic adenopathy, an 8.5 x 7.1 cm necrotic right inguinal node, post hyst ooph, no ascites, likely tumor involvement in left psoas with adjacent involvement of L4 and L5 vertebral bodies; CT chest subsequently had multiple bilateral pulmonary nodules and central adenopathy. Patient was begun on heparin qtt which was transitioned to Eliquis (on pharmaceutical assistance program). CA 125 from 02-14-15 was 642.9, this having been 3.6 in 08-2010. Pathology from right inguinal nodal mass aspiration and core needle biopsy 02-15-15 necrotic material with metastatic adenocarcinoma with immunostains consistent with gyn primary ([id number], ER+). She was transfused 2 units PRBCs for hgb 6.7. Iron studies 2-12 with serum iron 14 and %sat 7, given feraheme x2. Dr Denman George saw in consultation prior to starting carboplatin taxotere  on 02-24-15. She reacted to [place] skin test prior to cycle 3, treatment continued with taxotere only thru cycle 5 on 05-26-15, tolerated neulasta poorly. She had I&D and capsulectomy of seroma  cavity right inguinal region on 05-12-15. CT CAP 06-14-15 significant improvement in all areas of metastatic disease with exception of enlarging cystic mass in left hilum.    Objective:  Vital signs in last 24 hours:  BP 186/90 mmHg  Pulse 96  Temp(Src) 98.7 F (37.1 C) (Oral)  Resp 18  Ht 5' 2"  (1.575 m)  Wt 171 lb 12.8 oz (77.928 kg)  BMI 31.41 kg/m2  SpO2 99% Weight down 2 lbs from early June. Alert, oriented and appropriate, looks comfortable, very pleasant as always, family very supportive. Ambulatory without  difficulty. Alopecia  HEENT:PERRL, sclerae not icteric. Oral mucosa moist without lesions, posterior pharynx clear.  Neck supple. No JVD.  Lymphatics:no cervical,supraclavicular adenopathy. Resp: clear to auscultation bilaterally and normal percussion bilaterally Cardio: regular rate and rhythm. No gallop. GI: soft, nontender, not distended, no mass or organomegaly. Normally active bowel sounds.  Musculoskeletal/ Extremities: LE without pitting edema, cords, tenderness Right inguinal area with superficial area of surgical incision ~ 0.5 cm not closed, no drainage. Some edema medial inguinal region, no erythema or tenderness.  Neuro: no significant peripheral neuropathy. Otherwise nonfocal. PSYCH appropriate mood and affect Skin without rash, ecchymosis, petechiae   Lab Results:  Results for orders placed or performed in visit on 07/07/15  CBC with Differential  Result Value Ref Range   WBC 6.8 3.9 - 10.3 10e3/uL   NEUT# 5.0 1.5 - 6.5 10e3/uL   HGB 9.1 (L) 11.6 - 15.9 g/dL   HCT 27.7 (L) 34.8 - 46.6 %   Platelets 297 145 - 400 10e3/uL   MCV 82.9 79.5 - 101.0 fL   MCH 27.2 25.1 - 34.0 pg   MCHC 32.9 31.5 - 36.0 g/dL   RBC 3.34 (L) 3.70 - 5.45 10e6/uL   RDW 15.5 (H) 11.2 - 14.5 %   lymph# 0.8 (L) 0.9 - 3.3 10e3/uL   MONO# 0.7 0.1 - 0.9 10e3/uL   Eosinophils Absolute 0.3 0.0 - 0.5 10e3/uL   Basophils Absolute 0.0 0.0 - 0.1 10e3/uL   NEUT% 73.2 38.4 - 76.8 %    LYMPH% 12.4 (L) 14.0 - 49.7 %   MONO% 10.0 0.0 - 14.0 %   EOS% 4.0 0.0 - 7.0 %   BASO% 0.4 0.0 - 2.0 %  Comprehensive metabolic panel  Result Value Ref Range   Sodium 140 136 - 145 mEq/L   Potassium 3.2 (L) 3.5 - 5.1 mEq/L   Chloride 104 98 - 109 mEq/L   CO2 25 22 - 29 mEq/L   Glucose 106 70 - 140 mg/dl   BUN 7.7 7.0 - 26.0 mg/dL   Creatinine 0.9 0.6 - 1.1 mg/dL   Total Bilirubin 0.40 0.20 - 1.20 mg/dL   Alkaline Phosphatase 106 40 - 150 U/L   AST 7 5 - 34 U/L   ALT <9 0 - 55 U/L   Total Protein 7.4 6.4 - 8.3 g/dL   Albumin 2.9 (L) 3.5 - 5.0 g/dL   Calcium 9.6 8.4 - 10.4 mg/dL   Anion Gap 11 3 - 11 mEq/L   EGFR 77 (L) >90 ml/min/1.73 m2  CA 125  Result Value Ref Range   Cancer Antigen (CA) 125 86.6 (H) 0.0 - 38.1 U/mL  CA 125 available after visit, as above; this had been 48 on 05-23-15 and 112  on 04-21-15.    Studies/Results: EXAM: CT CHEST, ABDOMEN, AND PELVIS WITH CONTRAST 06-14-15  COMPARISON: Chest CT 02/15/2015; abdomen pelvic CT 02/12/2015.  FINDINGS: CT CHEST FINDINGS  Mediastinum/Lymph Nodes: Normal heart size. Trace pericardial fluid. Aorta and main pulmonary artery are normal in caliber. Interval increase in size of left hilar mass measuring 6.2 x 7.3 cm (image 26; series 2), previously 4.0 x 3.2 cm. Interval decrease in size of right peritracheal adenopathy measuring 0.8 cm, previously 2.4 cm. Esophagus is unremarkable.  Lungs/Pleura: Significant interval decrease in size of multiple pulmonary nodules and masses. Left upper lobe mass has decreased in size measuring 5.1 x 5.5 cm (image 42; series 4), previously measuring 6.0 x 5.2 cm. Left lower lobe nodule is decreased in size measuring 1.9 x 1.8 cm (image 81; series 4), previously 3.8 x 3.5 cm. Multiple additional nodules have decreased in size including a right lower lobe nodule measuring 0.9 cm (image 56; series 4), previously 14 mm and a 0.5 cm right upper lobe nodule (image 23; series 4),  previously 0.6 cm. No pleural effusion or pneumothorax.  Musculoskeletal: No aggressive or acute appearing osseous lesions.  CT ABDOMEN PELVIS FINDINGS  Hepatobiliary: The liver is normal in size and contour. Gallbladder is unremarkable.  Pancreas: Unremarkable  Spleen: Unremarkable  Adrenals/Urinary Tract: The adrenal glands are normal. Unchanged 5.7 cm cyst inferior pole right kidney. Re- demonstrated 6 mm nonobstructing stone interpolar region right kidney. Urinary bladder is decompressed.  Stomach/Bowel: The appendix is normal. No abnormal bowel wall thickening or evidence for bowel obstruction. No free fluid or free intraperitoneal air.  Vascular/Lymphatic: Normal caliber abdominal aorta. Interval decrease in size of soft tissue mass within the left pelvic sidewall measuring 2.2 cm (image 99; series 2), previously 2.7 cm. Previously described soft tissue mass within the right inguinal region is no longer evident and there is now mild inflammatory fat stranding and thick walled cystic lesion measuring 2.8 cm. Soft tissue mass involving the left psoas musculature is decreased in size measuring 3.1 x 2.6 cm (image 83; series 2), previously 5.2 x 4.7 cm. Interval decrease in size of left periaortic lymph node measuring 10 mm (image 65; series 2), previously 18 mm. Known thrombus within the left external iliac vein and left common femoral vein not well demonstrated on current exam.  Other: Uterus and adnexal structures are surgically absent.  Musculoskeletal: Slight increased sclerosis of the lateral aspect of the L4 and L5 vertebral bodies.  IMPRESSION: Significant interval decrease in size of bilateral pulmonary nodules and masses as well as retroperitoneal, pelvic and inguinal adenopathy. Mass within the left upper lobe may represent metastatic disease or primary pulmonary malignancy.  Interval increase in size of large cystic mass within the  left hilum.  Interval sclerosis of the L4 and L5 vertebral body metastasis.   PACs images of the thoracic cystic mass reveiwed with family at time of visit; patient preferred not to see images.   Medications: I have reviewed the patient's current medications. MVI ok  DISCUSSION Reviewed scan information with patient and family. As PET and echo are scheduled in just over a week, seems best to hold chemo until this information is available.  Note marker available after visit has increased, which may be length of time off treatment now. It will likely be best to resume some systemic treatment as promptly as possible after decision re intervention for the hilar mass.     I discussed with them the fact that she seems to have  ongoing benefit from the taxotere, tho severity of aches from that drug and neulasta has been progressively more difficult for her to tolerate. I did explain that it can be best to continue an effective regimen until maximum benefit, but QOL certainly also important.  Alternatively could consider oral etoposide, IV gemzar (would require PAC) or doxil (would require PAC). She has been given written information about these agents.   IF ANTICOAGULATION TO BE HELD FOR PROCEDURE, IT WOULD BE VERY HELPFUL TO COORDINATE PAC PLACEMENT AT SAME TIME IF POSSIBLE.    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  Clinically and by CT CAP 06-14-15 much improved other than this large hilar mass,  but not tolerating taxotere/ neulasta due to severe aches.  Marker increasing, may be reflection of now 6 week break in treatment. Need to resume some systemic treatment as soon as possible after decision re chest mass.  Genetics testing negative  breast ovarian and gyn panel, reported today. 2. Post I&D with capsulectomy of seroma cavity 05-12-15, nearly healed now. Still adenocarcinoma in that path. 3.LLE DVT documented 02-12-15: on Eliquis, no longer symptomatic. No IVC filter. Eliquis approved thru [employer] assistance x 1 year.  4. Progressive anemia: improved with PRBCs. Post feraheme and continues oral iron. Has not yet done hemoccult cards despite several orders for these. 5. chemo nausea and vomiting improved with aloxi and protonix, continue. PO intake improved. 6.uncontrolled HTN initially: on labetalol 200 mg tid per urgent care + clonidine 0.1 mg tid and HCTZ. BP much better now. Still needs PCP to manage this 7.carbo skin test reaction cycle 3.  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 Stable or slightly progressive, feet > hands. 10.Social: Medicaid approved 11.difficult IV access not easy tho still manageable. Would need to be off Eliquis x 48 hrs for PAC by IR.  IF ANTICOAGULATION TO BE HELD FOR PROCEDURE, IT WOULD BE VERY HELPFUL TO COORDINATE PAC PLACEMENT AT SAME TIME IF POSSIBLE. 12.overdue mammograms, general medical care, colonoscopy. Noncompliance with follow up after cancer diagnoses and adjuvant treatment after ~ 2012. Noncompliant with care for HTN over past several years, despite family efforts ( daughter is pediatrician) Needs PCP but has not followed thru with referral to [facility]. 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 Repeat colonoscopy recommended 10-2009, apparently not done. Note iron deficiency. FOB not yet done, see above. 14.hypokalemia: continue supplement, follow   All questions answered at time of visit and they know to call if concerns prior to next scheduled visit 07-25-15. Cc this note to Dr Prescott Gum, his help appreciated. Time spent 30 min including >50% counseling and  coordination of care.    Yuleni Burich P, MD   07/09/2015, 6:10 PM

## 2015-07-07 NOTE — Telephone Encounter (Signed)
Indicated that we had good news on test results, but it was not a good time to talk for the patient.  She will CB.  I gave her my phone number.

## 2015-07-07 NOTE — Telephone Encounter (Signed)
Revealed negative genetic testing on the custom gene panel.  Discussed that it does not explain her cancer or that in her family, but it does indicate that the risk for her cancer to be hereditary is low.  We will communicate with Dr. Marko Plume.

## 2015-07-08 LAB — CA 125: CANCER ANTIGEN (CA) 125: 86.6 U/mL — AB (ref 0.0–38.1)

## 2015-07-11 ENCOUNTER — Ambulatory Visit: Payer: Self-pay | Admitting: Genetic Counselor

## 2015-07-11 DIAGNOSIS — C569 Malignant neoplasm of unspecified ovary: Secondary | ICD-10-CM

## 2015-07-11 DIAGNOSIS — Z1379 Encounter for other screening for genetic and chromosomal anomalies: Secondary | ICD-10-CM

## 2015-07-11 DIAGNOSIS — C541 Malignant neoplasm of endometrium: Secondary | ICD-10-CM

## 2015-07-11 NOTE — Progress Notes (Signed)
HPI: Ms. Maldonado was previously seen in the [place] clinic due to a personal history of cancer and concerns regarding a hereditary predisposition to cancer. Please refer to our prior cancer genetics clinic note for more information regarding Ms. Blanchard's medical, social and family histories, and our assessment and recommendations, at the time. Ms. Fake recent genetic test results were disclosed to her, as were recommendations warranted by these results. These results and recommendations are discussed in more detail below.  FAMILY HISTORY:  We obtained a detailed, 4-generation family history.  Significant diagnoses are listed below: Family History  Problem Relation Age of Onset  . Bone cancer Paternal Grandmother     OSSEOUS METASTASIS  . Heart disease Father   . Heart disease Sister   . Bone cancer Maternal Grandmother 75  . Leukemia Maternal Grandfather 6  . Liver cancer Maternal Uncle   . Breast cancer Cousin 98    maternal first cousin    The patient has three daughters who are cancer free. She had two sisters, one who disappeared and is presumed dead, about 30 years ago, and another who died at 58 from heart disease. The patient's parents are deceased. Her mother died at 5 and her father at 44 from heart disease. Her father was an only child and there is no other reported family history of cancer on his side. Her mother had one sister and two brothers. One brother had liver cancer diagnosed in his 52s. The sister had a daughter who was diagnosed with breast cancer at 52. The patient's maternal grandparents are deceased. Her grandmother had bone cancer at 81 and her grandfather had leukemia at 51. atient's maternal ancestors are of African American descent, and paternal ancestors are of African American descent. There is no reported Ashkenazi Jewish ancestry. There is no known consanguinity.  GENETIC TEST RESULTS: At the time of Ms. Sahakian's visit, we  recommended she pursue genetic testing of the Custom gene panel that evaluated the genes from the endometrial cancer panel and the breast/ovarian cancer panel. The Custom gene panel offered by [facility] includes sequencing and rearrangement analysis for the following 22 genes:  ATM, BARD1, BRCA1, BRCA2, BRIP1, CDH1, CHEK2, EPCAM, FANCC, MLH1, MSH2, MSH6, MUTYH, NBN, PALB2, PMS2, POLD1, PTEN, RAD51C, RAD51D, TP53, and XRCC2.  The report date is July 07, 2015.  Genetic testing was normal, and did not reveal a deleterious mutation in these genes. The test report has been scanned into EPIC and is located under the Molecular Pathology section of the Results Review tab.   We discussed with Ms. Ortez that since the current genetic testing is not perfect, it is possible there may be a gene mutation in one of these genes that current testing cannot detect, but that chance is small. We also discussed, that it is possible that another gene that has not yet been discovered, or that we have not yet tested, is responsible for the cancer diagnoses in the family, and it is, therefore, important to remain in touch with cancer genetics in the future so that we can continue to offer Ms. Klemz the most up to date genetic testing.   CANCER SCREENING RECOMMENDATIONS: This result is reassuring and indicates that Ms. Brenes likely does not have an increased risk for a future cancer due to a mutation in one of these genes. This normal test also suggests that Ms. Enke's cancer was most likely not due to an inherited predisposition associated with one of these genes.  Most  cancers happen by chance and this negative test suggests that her cancer falls into this category.  We, therefore, recommended she continue to follow the cancer management and screening guidelines provided by her oncology and primary healthcare provider.   RECOMMENDATIONS FOR FAMILY MEMBERS: Women in this family might be at some increased risk of developing  cancer, over the general population risk, simply due to the family history of cancer. We recommended women in this family have a yearly mammogram beginning at age 75, or 40 years younger than the earliest onset of cancer, an an annual clinical breast exam, and perform monthly breast self-exams. Women in this family should also have a gynecological exam as recommended by their primary provider. All family members should have a colonoscopy by age 60.  FOLLOW-UP: Lastly, we discussed with Ms. Skates that cancer genetics is a rapidly advancing field and it is possible that new genetic tests will be appropriate for her and/or her family members in the future. We encouraged her to remain in contact with cancer genetics on an annual basis so we can update her personal and family histories and let her know of advances in cancer genetics that may benefit this family.   Our contact number was provided. Ms. Adcox questions were answered to her satisfaction, and she knows she is welcome to call us at anytime with additional questions or concerns.   Roma Kayser, MS, [facility] Certified Genetic Counselor [email or web address]

## 2015-07-12 MED ORDER — LABETALOL HCL 100 MG PO TABS: 200.0000 mg | ORAL_TABLET | Freq: Three times a day (TID) | ORAL | Status: DC

## 2015-07-19 ENCOUNTER — Ambulatory Visit (HOSPITAL_COMMUNITY)
Admission: RE | Admit: 2015-07-19 | Discharge: 2015-07-19 | Disposition: A | Discharge status: Home / Self Care | Payer: Medicaid Other | Source: Ambulatory Visit | Attending: Cardiothoracic Surgery | Admitting: Cardiothoracic Surgery

## 2015-07-19 ENCOUNTER — Ambulatory Visit (HOSPITAL_COMMUNITY): Admission: RE | Admit: 2015-07-19 | Payer: Medicaid Other | Source: Ambulatory Visit

## 2015-07-19 DIAGNOSIS — I358 Other nonrheumatic aortic valve disorders: Secondary | ICD-10-CM | POA: Insufficient documentation

## 2015-07-19 DIAGNOSIS — I110 Hypertensive heart disease with heart failure: Secondary | ICD-10-CM | POA: Insufficient documentation

## 2015-07-19 DIAGNOSIS — R222 Localized swelling, mass and lump, trunk: Secondary | ICD-10-CM | POA: Insufficient documentation

## 2015-07-19 DIAGNOSIS — I313 Pericardial effusion (noninflammatory): Secondary | ICD-10-CM | POA: Diagnosis not present

## 2015-07-19 DIAGNOSIS — I509 Heart failure, unspecified: Secondary | ICD-10-CM | POA: Insufficient documentation

## 2015-07-19 DIAGNOSIS — R918 Other nonspecific abnormal finding of lung field: Secondary | ICD-10-CM

## 2015-07-19 NOTE — Progress Notes (Signed)
  Echocardiogram 2D Echocardiogram has been performed.  [name] 07/19/2015, 11:49 AM

## 2015-07-20 ENCOUNTER — Ambulatory Visit: Payer: Medicaid Other | Admitting: Cardiothoracic Surgery

## 2015-07-20 ENCOUNTER — Other Ambulatory Visit: Payer: Self-pay | Admitting: Oncology

## 2015-07-25 ENCOUNTER — Ambulatory Visit: Payer: Medicaid Other | Admitting: Oncology

## 2015-07-26 ENCOUNTER — Ambulatory Visit: Payer: Medicaid Other | Admitting: Cardiothoracic Surgery

## 2015-08-01 ENCOUNTER — Encounter (HOSPITAL_COMMUNITY)
Admission: RE | Admit: 2015-08-01 | Discharge: 2015-08-01 | Disposition: A | Discharge status: Home / Self Care | Payer: Medicaid Other | Source: Ambulatory Visit | Attending: Cardiothoracic Surgery | Admitting: Cardiothoracic Surgery

## 2015-08-01 DIAGNOSIS — R918 Other nonspecific abnormal finding of lung field: Secondary | ICD-10-CM

## 2015-08-01 DIAGNOSIS — R222 Localized swelling, mass and lump, trunk: Secondary | ICD-10-CM | POA: Diagnosis present

## 2015-08-01 LAB — GLUCOSE, CAPILLARY: Glucose-Capillary: 117 mg/dL — ABNORMAL HIGH (ref 65–99)

## 2015-08-01 MED ORDER — FLUDEOXYGLUCOSE F - 18 (FDG) INJECTION
8.8700 | Freq: Once | INTRAVENOUS | Status: AC | PRN
  Administered 2015-08-01: 8.87 via INTRAVENOUS

## 2015-08-03 ENCOUNTER — Ambulatory Visit (INDEPENDENT_AMBULATORY_CARE_PROVIDER_SITE_OTHER): Payer: Medicaid Other | Admitting: Cardiothoracic Surgery

## 2015-08-03 ENCOUNTER — Other Ambulatory Visit: Payer: Self-pay | Admitting: *Deleted

## 2015-08-03 ENCOUNTER — Encounter: Payer: Self-pay | Admitting: Cardiothoracic Surgery

## 2015-08-03 VITALS — BP 160/90 | HR 114 | Resp 20 | Ht 62.0 in | Wt 170.0 lb

## 2015-08-03 DIAGNOSIS — Z8543 Personal history of malignant neoplasm of ovary: Secondary | ICD-10-CM | POA: Diagnosis not present

## 2015-08-03 DIAGNOSIS — R918 Other nonspecific abnormal finding of lung field: Secondary | ICD-10-CM

## 2015-08-03 DIAGNOSIS — Z8542 Personal history of malignant neoplasm of other parts of uterus: Secondary | ICD-10-CM | POA: Diagnosis not present

## 2015-08-03 DIAGNOSIS — R222 Localized swelling, mass and lump, trunk: Secondary | ICD-10-CM | POA: Diagnosis not present

## 2015-08-03 NOTE — Progress Notes (Signed)
PCP is No PCP Per Patient Referring Provider is Gordy Levan, MD  Chief Complaint  Patient presents with  . 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 The medial component of the mass was cystic-necrotic in morphology. Before proceeding with further chemotherapy the patient's oncologist wanted to determine that she did not have a separate lung primary. The patient returns now with PET scan to review the situation. PET scan showed the left upper lobe mass to have SUV of 20.2. This was larger than on the previous CT scan performed in June. There was also hypermetabolic activity in the pelvis with also enlarging nodal masses compared to the CT scan in June. This appears to be progressive metastatic involvement and not likely to be primary pulmonary malignancy. To confirm this the patient will be referred for IR CT-guided needle biopsy. The patient will stop her Eliquis 2 days before the procedure. IR will also place a Port-A-Cath while the patient is off Eliquis per request of her oncologist For further chemotherapy. Past Medical History:  Diagnosis Date  . Chemotherapy-induced neuropathy ([place])   . DVT, lower extremity ([place])    left  02-12-2015  currently on Eliqius  . Endometrial cancer [facility]) oncologist-  dr Marko Plume   02-2015  . 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 Family history of breast cancer   . History of adenomatous polyp of colon    tubulovillious adenoma 09/ 2010  . History of ovarian cyst    complex  . History of radiation therapy 10/21/08-11/23/08 & 12/07/10/13/12/23 2010   ENDOMETRIOID   . History of uterine fibroid   . Hypertension   . Inguinal fluid collection 02/2015   right-drained x2  . Iron deficiency anemia    chronic severe  . Metastasis to lung ([place])   . Metastasis to lymph nodes [facility])     Past Surgical History:  Procedure Laterality Date  . ABDOMINAL HYSTERECTOMY    . APPLICATION OF WOUND VAC Right 05/12/2015   Procedure: APPLICATION OF WOUND VAC;  Surgeon: Hall Busing, MD;  Location: [facility];  Service: General;  Laterality: Right;  . COLONOSCOPY W/ POLYPECTOMY  09-27-2008  . EXPLORATORY LAPAROTOMY /  TOTAL ABDOMINAL HYSTERECTOMY/ BILATERAL SALPINOOPHORECTOMY/  PARTIAL OMENTECTOMY  03-18-2008  . INGUINAL HERNIA REPAIR Right 05/12/2015   Procedure: INCISION AND DRAINGE RIGHT INGUINAL FLUID COLLECTION ;  Surgeon: Hall Busing, MD;  Location: [facility];  Service: General;  Laterality: Right;  . porta cath  06/2010   removal  . TUBAL LIGATION  1990's  . UMBILICAL HERNIA REPAIR     infant    Family History  Problem Relation Age of Onset  . Bone cancer Paternal Grandmother     OSSEOUS METASTASIS  . Heart disease Father   . Heart disease Sister   . Bone cancer Maternal Grandmother 75  . Leukemia Maternal Grandfather 60  . Liver cancer Maternal Uncle   . Breast cancer Cousin 58    maternal first cousin    Social History Social History  Substance Use Topics  . Smoking status: Never Smoker  . Smokeless tobacco: Never Used  . Alcohol use No    Current  Outpatient Prescriptions  Medication Sig Dispense Refill  . apixaban (ELIQUIS) 5 MG TABS tablet Take 1 tablet (5 mg total) by mouth 2 (two) times daily. 60 tablet 0  . cloNIDine (CATAPRES) 0.1 MG tablet Take 0.1 mg by mouth daily.     Marland Kitchen dexamethasone (DECADRON) 4 MG tablet Take 2 tablets (8 mg total) by mouth 2 (two) times daily. For 3 days.  Start the day before Taxotere. 12 tablet 1  . ferrous sulfate 325 (65 FE) MG tablet Take 1 tablet (325 mg total) by mouth 2 (two) times daily with a meal. 60 tablet 3  . hydrochlorothiazide (HYDRODIURIL) 25 MG  tablet Take 25 mg by mouth daily.      Marland Kitchen labetalol (NORMODYNE) 100 MG tablet Take 2 tablets (200 mg total) by mouth 3 (three) times daily. 180 tablet 0  . loratadine (CLARITIN) 10 MG tablet Take 10 mg by mouth daily. Reported on 07/07/2015    . LORazepam (ATIVAN) 0.5 MG tablet Place 1 tablet under the tongue or swallow every 6 hrs as needed for nausea. 30 tablet 0  . morphine (MSIR) 15 MG tablet Take 1 tablet (15 mg total) by mouth every 4 (four) hours as needed for moderate pain or severe pain. 20 tablet 0  . pantoprazole (PROTONIX) 40 MG tablet Take 1 tablet (40 mg total) by mouth daily. 30 tablet 2  . polyethylene glycol (MIRALAX / GLYCOLAX) packet Take 17 g by mouth 2 (two) times daily as needed. Reported on 05/23/2015    . potassium chloride (K-DUR) 10 MEQ tablet Take 2 tablets today and then 1 tablet daily 30 tablet 1  . prochlorperazine (COMPAZINE) 10 MG tablet Take 1 tablet (10 mg total) by mouth every 6 (six) hours as needed (Nausea or vomiting). 30 tablet 1  . zolpidem (AMBIEN) 5 MG tablet Take 1 tablet (5 mg total) by mouth at bedtime as needed for sleep. 30 tablet 2   No current facility-administered medications for this visit.     No Known Allergies  Review of Systems  Patient was able to complete a trip to the beach but has felt weak poor appetite and short of breath.  BP (!) 160/90 (BP Location: Left Arm, Patient Position: Sitting, Cuff Size: Normal)   Pulse (!) 114   Resp 20   Ht 5\' 2"  (1.575 m)   Wt 170 lb (77.1 kg)   SpO2 95% Comment: RA  BMI 31.09 kg/m  Physical Exam Chronically ill appearing middle-aged AA female Oral mucosa dry pupils equal Neck without JVD or palpable adenopathy Diminished breath sounds on left Heart rate rapid without murmur Abdomen full nontender Neuro intact  Diagnostic Tests: PET scan with above findings of a large very hypermetabolic left upper lobe mass most likely metastatic GYN malignancy-needle biopsy pending  Echocardiogram  performed shows normal LV systolic function but with LVH from long-standing hypertension, no structural abnormality of the aortic or mitral valves.  Impression: Patient will be set up for a transthoracic needle biopsy of the left lung mass to confirm this is not primary lung malignancy and will undergo placement of Port-A-Cath by IR at the same time while she is off her Eliquis for history of DVT.  Plan:  Patient will return to her oncologist for follow-up pending biopsy results unless a primary lung malignancy is diagnosed which is unlikely  Len Childs, MD [facility] [phone]

## 2015-08-04 ENCOUNTER — Other Ambulatory Visit: Payer: Self-pay | Admitting: Oncology

## 2015-08-04 ENCOUNTER — Telehealth: Payer: Self-pay | Admitting: Oncology

## 2015-08-04 ENCOUNTER — Other Ambulatory Visit: Payer: Self-pay

## 2015-08-04 ENCOUNTER — Ambulatory Visit (HOSPITAL_BASED_OUTPATIENT_CLINIC_OR_DEPARTMENT_OTHER): Payer: Medicaid Other | Admitting: Oncology

## 2015-08-04 ENCOUNTER — Encounter: Payer: Self-pay | Admitting: Oncology

## 2015-08-04 ENCOUNTER — Ambulatory Visit (HOSPITAL_BASED_OUTPATIENT_CLINIC_OR_DEPARTMENT_OTHER): Payer: Medicaid Other

## 2015-08-04 VITALS — BP 157/81 | HR 115 | Temp 98.6°F | Resp 18 | Ht 62.0 in | Wt 164.5 lb

## 2015-08-04 DIAGNOSIS — C541 Malignant neoplasm of endometrium: Secondary | ICD-10-CM

## 2015-08-04 DIAGNOSIS — E876 Hypokalemia: Secondary | ICD-10-CM

## 2015-08-04 DIAGNOSIS — D6481 Anemia due to antineoplastic chemotherapy: Secondary | ICD-10-CM

## 2015-08-04 DIAGNOSIS — C7801 Secondary malignant neoplasm of right lung: Secondary | ICD-10-CM

## 2015-08-04 DIAGNOSIS — D638 Anemia in other chronic diseases classified elsewhere: Secondary | ICD-10-CM

## 2015-08-04 DIAGNOSIS — T451X5A Adverse effect of antineoplastic and immunosuppressive drugs, initial encounter: Secondary | ICD-10-CM

## 2015-08-04 DIAGNOSIS — C801 Malignant (primary) neoplasm, unspecified: Secondary | ICD-10-CM

## 2015-08-04 DIAGNOSIS — C778 Secondary and unspecified malignant neoplasm of lymph nodes of multiple regions: Secondary | ICD-10-CM

## 2015-08-04 DIAGNOSIS — G620 Drug-induced polyneuropathy: Secondary | ICD-10-CM

## 2015-08-04 DIAGNOSIS — Z8543 Personal history of malignant neoplasm of ovary: Secondary | ICD-10-CM

## 2015-08-04 DIAGNOSIS — I10 Essential (primary) hypertension: Secondary | ICD-10-CM | POA: Diagnosis not present

## 2015-08-04 DIAGNOSIS — D649 Anemia, unspecified: Secondary | ICD-10-CM | POA: Diagnosis not present

## 2015-08-04 DIAGNOSIS — I82402 Acute embolism and thrombosis of unspecified deep veins of left lower extremity: Secondary | ICD-10-CM

## 2015-08-04 DIAGNOSIS — C569 Malignant neoplasm of unspecified ovary: Secondary | ICD-10-CM

## 2015-08-04 DIAGNOSIS — Z8542 Personal history of malignant neoplasm of other parts of uterus: Secondary | ICD-10-CM

## 2015-08-04 DIAGNOSIS — C7802 Secondary malignant neoplasm of left lung: Secondary | ICD-10-CM | POA: Diagnosis not present

## 2015-08-04 DIAGNOSIS — Z7901 Long term (current) use of anticoagulants: Secondary | ICD-10-CM

## 2015-08-04 DIAGNOSIS — R918 Other nonspecific abnormal finding of lung field: Secondary | ICD-10-CM

## 2015-08-04 LAB — COMPREHENSIVE METABOLIC PANEL
Albumin: 2.7 g/dL — ABNORMAL LOW (ref 3.5–5.0)
Alkaline Phosphatase: 92 U/L (ref 40–150)
Anion Gap: 12 mEq/L — ABNORMAL HIGH (ref 3–11)
BUN: 14.4 mg/dL (ref 7.0–26.0)
CALCIUM: 10 mg/dL (ref 8.4–10.4)
CHLORIDE: 99 meq/L (ref 98–109)
CO2: 28 mEq/L (ref 22–29)
CREATININE: 1 mg/dL (ref 0.6–1.1)
EGFR: 74 mL/min/{1.73_m2} — ABNORMAL LOW (ref >90)
Glucose: 107 mg/dl (ref 70–140)
Potassium: 3.3 mEq/L — ABNORMAL LOW (ref 3.5–5.1)
Sodium: 139 mEq/L (ref 136–145)
Total Bilirubin: 0.34 mg/dL (ref 0.20–1.20)
Total Protein: 7.4 g/dL (ref 6.4–8.3)

## 2015-08-04 LAB — CBC WITH DIFFERENTIAL/PLATELET
BASO%: 0.9 % (ref 0.0–2.0)
Basophils Absolute: 0.1 10*3/uL (ref 0.0–0.1)
EOS%: 1.6 % (ref 0.0–7.0)
Eosinophils Absolute: 0.1 10*3/uL (ref 0.0–0.5)
HEMATOCRIT: 26.4 % — AB (ref 34.8–46.6)
HEMOGLOBIN: 8.5 g/dL — AB (ref 11.6–15.9)
LYMPH%: 12 % — ABNORMAL LOW (ref 14.0–49.7)
MCH: 25.2 pg (ref 25.1–34.0)
MCHC: 32.1 g/dL (ref 31.5–36.0)
MCV: 78.4 fL — ABNORMAL LOW (ref 79.5–101.0)
MONO#: 0.7 10*3/uL (ref 0.1–0.9)
MONO%: 10.6 % (ref 0.0–14.0)
NEUT%: 74.9 % (ref 38.4–76.8)
NEUTROS ABS: 5.1 10*3/uL (ref 1.5–6.5)
Platelets: 433 10*3/uL — ABNORMAL HIGH (ref 145–400)
RBC: 3.37 10*6/uL — ABNORMAL LOW (ref 3.70–5.45)
RDW: 17.3 % — AB (ref 11.2–14.5)
WBC: 6.8 10*3/uL (ref 3.9–10.3)
lymph#: 0.8 10*3/uL — ABNORMAL LOW (ref 0.9–3.3)

## 2015-08-04 MED ORDER — BENZONATATE 100 MG PO CAPS: 100.0000 mg | ORAL_CAPSULE | Freq: Three times a day (TID) | ORAL | 0 refills | Status: DC | PRN

## 2015-08-04 MED ORDER — MORPHINE SULFATE 15 MG PO TABS: 15.0000 mg | ORAL_TABLET | Freq: Four times a day (QID) | ORAL | 0 refills | Status: DC | PRN

## 2015-08-04 MED ORDER — HYDROCOD POLST-CPM POLST ER 10-8 MG/5ML PO SUER: ORAL | 0 refills | Status: DC

## 2015-08-04 MED ORDER — LIDOCAINE-PRILOCAINE 2.5-2.5 % EX CREA: TOPICAL_CREAM | CUTANEOUS | 1 refills | Status: AC

## 2015-08-04 MED ORDER — LORAZEPAM 0.5 MG PO TABS
ORAL_TABLET | ORAL | 0 refills | Status: DC
Start: 2015-08-04 — End: 2015-09-06

## 2015-08-04 NOTE — Telephone Encounter (Signed)
per pof to sch pt appt-per MW sch trmt ok-gasve pt copy of avs/cal

## 2015-08-04 NOTE — Telephone Encounter (Signed)
Lab added after MD apt because no 1:00 or 1:15 apt available

## 2015-08-04 NOTE — Progress Notes (Signed)
OFFICE PROGRESS NOTE   August 07, 2015   Physicians: E.Rossi, J.Kinard, C.Windham, P.Lucianne Lei Trigt  [name], [facility]) New patient referral made to [facility] for PCP, not established yet.   INTERVAL HISTORY:  Patient is seen, together with daughter and 2 other family members, in continuing attention to extensively metastatic gyn carcinoma involving chest, lungs, nodes, bone. She was last treated with taxotere on 05-26-15, treatment delayed since then for her vacation and evaluation of central chest mass. She cancelled planned visit with this MD on 07-25-15.  Echocardiogram 07-19-15 done to evaluate central chest mass: EF 60-65%. She had PET 08-01-15, which now shows significant progression of multiple areas of disease in addition to the chest involvement. Dr Prescott Gum has set up biopsy of chest mass + PAC 08-11-15. With progression of all areas now, I do not feel that biopsy of chest mass will change treatment, so have cancelled this; she does need PAC.  She continues Eliquis for LLE DVT identified 02-12-15, will need to hold this x 48 hrs prior to [facility].  Patient reports feeling generally tired and weak, poor appetite, SOB with exertion, no bleeding, no fever, pain controlled with MSIR only once daily and no complaints of chest pain. No other clear cardiac symptoms.  She has had 2-3 episodes of severe coughing in last few weeks which have triggered vomiting; otherwise minimal if any yellow phlegm. She has used nothing for the cough. Ativan is most helpful to relax and for nausea. Bowels are moving. She has no increased swelling or other problems where drainage procedure done for necrotic right inguinal adenopathy, that healed with exception of a few mm not quite re-epitheliazed. She is able to sleep.  Remainder of 10 point Review of Systems negative/ unchanged.   She continues Eliquis for LLE DVT  No central catheter Genetics testing negative Breast Ovarian and gyn panel by  [facility]  06-14-15.  CA 125 on 02-14-15 642.9 ER + on cytology 02-2015 Feraheme 02-16-15 and [id number].    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 She was lost to follow up for the gyn cancer after 11-2010, until presented with recurrent disease. Patient reported right inguinal mass for ~ 2 months, then LLE pain for ~ 3 weeks when she presented to ED on 02-12-15. CT AP showed nodules in lung bases suspicious for metastatic disease, liver not remarkable, no hydronephrosis, 6 mm right renal calculus, thrombus left external iliac vein into left common femoral vein, retroperitoneal and pelvic adenopathy, an 8.5 x 7.1 cm necrotic right inguinal node, post hyst ooph, no ascites, likely tumor involvement in left psoas with adjacent involvement of L4 and L5 vertebral bodies; CT chest subsequently had multiple bilateral pulmonary nodules and central adenopathy. Patient was begun on heparin qtt which was transitioned to Eliquis (on pharmaceutical assistance program). CA 125 from 02-14-15 was 642.9, this having been 3.6 in 08-2010. Pathology from right inguinal nodal mass aspiration and core needle biopsy 02-15-15 necrotic material with metastatic adenocarcinoma with immunostains consistent with gyn primary ([id number], ER+). She was transfused 2 units PRBCs for hgb 6.7. Iron studies 2-12 with serum iron 14 and %sat 7, given feraheme x2. Dr Denman George saw in consultation prior to starting carboplatin taxotere on 02-24-15. She  reacted to [place] skin test prior to cycle 3, treatment continued with taxotere only thru cycle 5 on 05-26-15, tolerated neulasta poorly. She had I&D and capsulectomy of seroma  cavity right inguinal region on 05-12-15. CT CAP 06-14-15 significant improvement in all areas of metastatic disease with exception of enlarging cystic mass in left hilum. Echocardiogram August 18, 2015 done to evaluate central chest mass: EF 60-65% and minimal pericardial effusion. PET 08-01-15 showed significant progression of multiple areas of disease in addition to the chest involvement.   Objective:  Vital signs in last 24 hours:  BP (!) 157/81 (BP Location: Left Arm, Patient Position: Sitting) Comment: informed nurse  Pulse (!) 115   Temp 98.6 F (37 C) (Oral)   Resp 18   Ht 5' 2"  (1.575 m)   Wt 164 lb 8 oz (74.6 kg)   SpO2 98%   BMI 30.09 kg/m  Weight down 6 lbs. Respirations not labored RA, no coughing during visit. Looks tired but not in acute discomfort.  Alert, oriented and appropriate. Ambulatory without assistance, able to get on and off exam table.Marland Kitchen   HEENT:PERRL, sclerae not icteric. Oral mucosa moist without lesions, posterior pharynx clear.  Neck supple. No JVD.  Lymphatics:no cervical,supraclavicular adenopathy. Patient not undressed for exam of right inguinal area Resp: diminished/ absent BS lower 1/2 left, no wheezes or crackles, no use of accessory muscles.  Cardio: regular rate and rhythm, clear heart sounds. No gallop. GI: soft, nontender, not obviously more distended. Some bowel sounds.  Musculoskeletal/ Extremities: LE without pitting edema, cords, tenderness Neuro: no change peripheral neuropathy from taxane. Otherwise nonfocal Skin without rash, ecchymosis, petechiae   Lab Results: CBC today WBC 6.8, ANC 5.1, Hgb 8.5, plt 433, MCV 78.4 CMET available after visit normal with exception of K 3.3, albumin 2.7. Other electrolytes ok, creat 1.0, LFTs ok, Calcium 10 uncorrected.   CA 125 on 07-07-15    86.6, this having been 48 on 05-23-15.  Studies/Results:  Echocardiogram Aug 18, 2015 Impressions:  - LVEF 60-65%, severe LVH, mildly reduced GLPSS at -17% with    inferolateral and anteroseptal strain abnormalities, incoordinate   septal motion, diastolic dysfunction, indeterminate LV filling   pressure, normal LA size, normal IVC, trivial posterior   pericardial effusion   NM PET Image Initial (PI) Skull Base To Thigh (Accession [id number]) (Order [id number])  08-01-15  CLINICAL DATA:  Initial treatment strategy for hilar mass. History of endometrial cancer.  COMPARISON:  CT chest abdomen pelvis dated 06/14/2015 FINDINGS: NECK No hypermetabolic lymph nodes in the neck. CHEST 5.2 x 6.3 cm mass in the posterior left upper lobe (series 4/image 58), max SUV 21.1, previously 5.1 x 5.5 cm. Additional 7.8 x 7.5 cm necrotic mass along the medial left upper lobe abutting the mediastinum (series 4/ image 87), max SUV 13.5 medially, previously 6.2 x 7.3 cm. 2.6 x 2.2 cm left lower lobe nodule (series 8/ image 38), max SUV 22.2, previously 1.9 x 1.8 cm. Thoracic lymphadenopathy, including: --dominant 3.7 x 3.4 cm necrotic prevascular node (series 4/ image 58), max SUV 7.6, previously 3.5 x 2.6 cm --7 mm short axis high right paratracheal node max SUV 8.4 --7 mm short axis AP window node, max SUV 5.1 ABDOMEN/PELVIS No abnormal hypermetabolic activity within the liver, pancreas, adrenal glands, or spleen. Abnormal soft tissue along the medial aspect of the left psoas and along the left common iliac chain, measuring 3.0 x 2.2 cm with max SUV 7.2. This is difficult to discretely measure but likely measures approximately 2.5 x  1.5 cm on the prior. 2.0 x 3.0 cm left common iliac nodal mass (series 4/ image 163), max SUV 7.8, previously approximately 2.8 x 1.7 cm. Postsurgical changes in the right inguinal region with surgical clip (series 4/images 170-172), max SUV 6.6. SKELETON No focal hypermetabolic activity to suggest skeletal metastasis. IMPRESSION: Interval progression, as described above. Dominant 7.8 cm left upper lobe mass abuts the  mediastinum. Additional left upper lobe/ left lower lobe masses and thoracic lymphadenopathy. Additional retroperitoneal/left pelvic nodal metastases. Postsurgical changes in the right inguinal region with mild residual hypermetabolism.   PACs images for PET reviewed with daughter and family member; patient preferred not to see the images but did listen to our discussion of those images.   Medications: I have reviewed the patient's current medications. Ativan refilled, Tussionex and tessalon perles.  She knows to hold Eliquis x 48 hrs prior to [facility] on 08-11-15  DISCUSSION Significant progression of disease now in multiple areas since she has been off treatment, whereas progression had been apparent only in chest after carboplatin and taxane. I have recommended that we change chemotherapy to gemzar every other week, in palliative attempt. IAs the progression is no longer limited to chest, I do not believe that CT biopsy now will change available options for intervention, and patient is in agreement with cancelling that biopsy. We will arrange consultation with radiation oncology for possible palliative treatment of chest, but will not delay start of gemzar for that consultation. She needs PAC due to poor access and the gemzar itself, so will proceed with that placement at [facility] on 08-10-15 as I have confirmed with IR now.  I have discussed rationale for change in treatment to gemzar. Patient has been given written information on gemzar and will have full teaching by RN.   Encouraged supplement drinks. Encouraged small meals frequently.  Discussed meds as above. No steroid premeds needed with gemzar.   Not discussed, other options doxil, oral VP16. Nivolumab study allows up to 2 prior regimens for metastatic.  Assessment/Plan:  1.metastatic adenocarcinoma of gyn primary, in patient with history of IC clear cell ovarian and IA high grade endometrial carcinomas 03-2008 (incomplete staging), post TAH  BSO, adjuvant carboplatin taxol x 6 cycles and radiation, with significant residual taxol neuropathy in feet.  Clinically and by CT CAP 06-14-15 much improved other than large hilar mass,  also not tolerating taxotere/ neulasta due to severe aches.  Now extensive progression in multiple areas in addition to chest, needs to resume chemo with different regimen, plan q o wk gemzar. Radiation oncology consultation in case treatment to chest needed.  Genetics testing negative breast ovarian and gyn panel Appreciate Dr Lucianne Lei Trigt's help, prn follow up with him. 2. Post I&D with capsulectomy of seroma cavity 05-12-15, healed.  adenocarcinoma in that path. 3.LLE DVT documented 02-12-15: on Eliquis, no longer symptomatic. No IVC filter. Eliquis approved thru [employer] assistance x 1 year. Hold x 48 hrs prior to [facility] placement 4. Progressive anemia: previous PRBCs. Post feraheme and continues oral iron. Has not yet done hemoccult cards despite several orders for these.Patient seems symptomatic with hgb down to 8.5, also anticipate further drop with upcoming chemo so will transfuse 1 unit PRBCs in next few days.  5. chemo nausea and vomiting improved with aloxi and protonix with prior regimen. PO intake decreased, appetite decreased with progression of disease now. Encouraged supplements.  6.uncontrolled HTN initially: on labetalol 200 mg tid per urgent care + clonidine 0.1 mg tid and HCTZ.  BP much better now. Still needs PCP to manage this 7.carbo skin test reaction cycle 3.  8.information on advance directives given, but apparently not yet completed. Need to follow up 9.chemo peripheral neuropathy related to previous taxol, thus choice of taxotere now. Stable or slightly progressive, feet > hands with additional taxane recently. 10.Social: Medicaid approved 11.difficult IV access and medications requiring central catheter for administration: PAC by IR 08-10-15 12.overdue mammograms, general medical care,  colonoscopy. Noncompliance with follow up after cancer diagnoses and adjuvant treatment after ~ 2012. Noncompliant with care for HTN over past several years, despite family efforts ( daughter is pediatrician) Needs PCP but has not followed thru with referral to [facility]. 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 Repeat colonoscopy recommended 10-2009, apparently not done. Note iron deficiency. FOB not yet done, see above. 14.hypokalemia: needs to continue supplement, not sure if she is compliant, in EMR 20 mEq daily 15.corrected calcium slightly elevated when available after visit today. Will need  IV bisphosphonate added to regimen. 16.reaction to [place] skin test 04-07-15  All questions answered and patient/ family know to call if needed prior to next scheduled visit. Transfusion scheduling requested. Chemo orders placed, managed care notified, hope to treat 8-10 after PAC 8-9. Time spent 40 min including >50% counseling and coordination of care.     Evlyn Clines, MD   08/07/2015, 11:09 AM

## 2015-08-05 ENCOUNTER — Other Ambulatory Visit (HOSPITAL_BASED_OUTPATIENT_CLINIC_OR_DEPARTMENT_OTHER): Payer: Medicaid Other

## 2015-08-05 ENCOUNTER — Other Ambulatory Visit: Payer: Self-pay

## 2015-08-05 ENCOUNTER — Ambulatory Visit (HOSPITAL_COMMUNITY)
Admission: RE | Admit: 2015-08-05 | Discharge: 2015-08-05 | Disposition: A | Discharge status: Home / Self Care | Payer: Medicaid Other | Source: Ambulatory Visit | Attending: Oncology | Admitting: Oncology

## 2015-08-05 DIAGNOSIS — D649 Anemia, unspecified: Secondary | ICD-10-CM

## 2015-08-05 DIAGNOSIS — C801 Malignant (primary) neoplasm, unspecified: Secondary | ICD-10-CM

## 2015-08-05 DIAGNOSIS — C541 Malignant neoplasm of endometrium: Secondary | ICD-10-CM

## 2015-08-05 DIAGNOSIS — D638 Anemia in other chronic diseases classified elsewhere: Secondary | ICD-10-CM

## 2015-08-06 ENCOUNTER — Ambulatory Visit (HOSPITAL_BASED_OUTPATIENT_CLINIC_OR_DEPARTMENT_OTHER): Payer: Medicaid Other

## 2015-08-06 DIAGNOSIS — Z452 Encounter for adjustment and management of vascular access device: Secondary | ICD-10-CM | POA: Diagnosis not present

## 2015-08-06 DIAGNOSIS — C801 Malignant (primary) neoplasm, unspecified: Secondary | ICD-10-CM

## 2015-08-06 DIAGNOSIS — D649 Anemia, unspecified: Secondary | ICD-10-CM | POA: Diagnosis not present

## 2015-08-06 LAB — PREPARE RBC (CROSSMATCH)

## 2015-08-06 MED ORDER — SODIUM CHLORIDE 0.9 % IV SOLN
250.0000 mL | Freq: Once | INTRAVENOUS | Status: AC
  Administered 2015-08-06: 250 mL via INTRAVENOUS

## 2015-08-06 MED ORDER — ACETAMINOPHEN 325 MG PO TABS
325.0000 mg | ORAL_TABLET | Freq: Once | ORAL | Status: AC
  Administered 2015-08-06: 325 mg via ORAL

## 2015-08-06 MED ORDER — ACETAMINOPHEN 325 MG PO TABS
ORAL_TABLET | ORAL | Status: AC
  Filled 2015-08-06: qty 2

## 2015-08-06 NOTE — Patient Instructions (Signed)

## 2015-08-07 ENCOUNTER — Encounter: Payer: Self-pay | Admitting: Oncology

## 2015-08-07 ENCOUNTER — Other Ambulatory Visit: Payer: Self-pay | Admitting: Oncology

## 2015-08-07 NOTE — Progress Notes (Signed)
[place] END OF TREATMENT   Name: [name] Date: August 07, 2015  MRN: [id number] DOB: August 25, 1955   TREATMENT DATES:  02-24-15  thru 05-26-15   REFERRING PHYSICIAN: E.Rossi   DIAGNOSIS: metastatic ovarian vs endometrial adenocarcinoma   STAGE AT START OF TREATMENT: IVB   INTENT: control   DRUGS OR REGIMENS GIVEN: carboplatin (cycles 1 and 2) taxotere (cycles 1-5)   MAJOR TOXICITIES: carbo skin test reaction. Aches, peripheral neuropathy, cytopenias   REASON TREATMENT STOPPED: progression   PERFORMANCE STATUS AT END:  1   ONGOING PROBLEMS: peripheral neuropathy, progressive mass central chest   FOLLOW UP PLANS: evaluation of chest mass, change chemo regimen

## 2015-08-08 LAB — TYPE AND SCREEN
ABO/RH(D): O POS
Antibody Screen: NEGATIVE
UNIT DIVISION: 0

## 2015-08-09 ENCOUNTER — Other Ambulatory Visit: Payer: Self-pay | Admitting: Physician Assistant

## 2015-08-09 ENCOUNTER — Other Ambulatory Visit: Payer: Self-pay | Admitting: General Surgery

## 2015-08-10 ENCOUNTER — Ambulatory Visit (HOSPITAL_COMMUNITY)
Admission: RE | Admit: 2015-08-10 | Discharge: 2015-08-10 | Disposition: A | Discharge status: Home / Self Care | Payer: Medicaid Other | Source: Ambulatory Visit | Attending: Cardiothoracic Surgery | Admitting: Cardiothoracic Surgery

## 2015-08-10 ENCOUNTER — Encounter (HOSPITAL_COMMUNITY): Payer: Self-pay

## 2015-08-10 ENCOUNTER — Other Ambulatory Visit: Payer: Self-pay | Admitting: Cardiothoracic Surgery

## 2015-08-10 DIAGNOSIS — C7800 Secondary malignant neoplasm of unspecified lung: Secondary | ICD-10-CM | POA: Diagnosis not present

## 2015-08-10 DIAGNOSIS — I10 Essential (primary) hypertension: Secondary | ICD-10-CM | POA: Insufficient documentation

## 2015-08-10 DIAGNOSIS — C569 Malignant neoplasm of unspecified ovary: Secondary | ICD-10-CM | POA: Insufficient documentation

## 2015-08-10 DIAGNOSIS — C541 Malignant neoplasm of endometrium: Secondary | ICD-10-CM | POA: Diagnosis present

## 2015-08-10 DIAGNOSIS — C779 Secondary and unspecified malignant neoplasm of lymph node, unspecified: Secondary | ICD-10-CM | POA: Diagnosis not present

## 2015-08-10 DIAGNOSIS — R918 Other nonspecific abnormal finding of lung field: Secondary | ICD-10-CM

## 2015-08-10 DIAGNOSIS — Z923 Personal history of irradiation: Secondary | ICD-10-CM | POA: Insufficient documentation

## 2015-08-10 DIAGNOSIS — Z86010 Personal history of colonic polyps: Secondary | ICD-10-CM | POA: Insufficient documentation

## 2015-08-10 DIAGNOSIS — Z8249 Family history of ischemic heart disease and other diseases of the circulatory system: Secondary | ICD-10-CM | POA: Diagnosis not present

## 2015-08-10 DIAGNOSIS — D509 Iron deficiency anemia, unspecified: Secondary | ICD-10-CM | POA: Insufficient documentation

## 2015-08-10 DIAGNOSIS — Z9221 Personal history of antineoplastic chemotherapy: Secondary | ICD-10-CM | POA: Insufficient documentation

## 2015-08-10 DIAGNOSIS — Z803 Family history of malignant neoplasm of breast: Secondary | ICD-10-CM | POA: Diagnosis not present

## 2015-08-10 HISTORY — PX: IR GENERIC HISTORICAL: IMG1180011

## 2015-08-10 LAB — BASIC METABOLIC PANEL
ANION GAP: 10 (ref 5–15)
BUN: 14 mg/dL (ref 6–20)
CHLORIDE: 100 mmol/L — AB (ref 101–111)
CO2: 25 mmol/L (ref 22–32)
Calcium: 9.3 mg/dL (ref 8.9–10.3)
Creatinine, Ser: 1.01 mg/dL — ABNORMAL HIGH (ref 0.44–1.00)
GFR calc Af Amer: >60 mL/min (ref >60)
GFR, EST NON AFRICAN AMERICAN: 58 mL/min — AB (ref >60)
GLUCOSE: 130 mg/dL — AB (ref 65–99)
POTASSIUM: 3.8 mmol/L (ref 3.5–5.1)
Sodium: 135 mmol/L (ref 135–145)

## 2015-08-10 LAB — CBC WITH DIFFERENTIAL/PLATELET
BASOS ABS: 0 10*3/uL (ref 0.0–0.1)
BASOS PCT: 0 %
EOS PCT: 1 %
Eosinophils Absolute: 0.1 10*3/uL (ref 0.0–0.7)
HCT: 28 % — ABNORMAL LOW (ref 36.0–46.0)
HEMOGLOBIN: 9 g/dL — AB (ref 12.0–15.0)
LYMPHS ABS: 1.1 10*3/uL (ref 0.7–4.0)
LYMPHS PCT: 11 %
MCH: 25.5 pg — ABNORMAL LOW (ref 26.0–34.0)
MCHC: 32.1 g/dL (ref 30.0–36.0)
MCV: 79.3 fL (ref 78.0–100.0)
MONO ABS: 1.1 10*3/uL — AB (ref 0.1–1.0)
MONOS PCT: 11 %
NEUTROS ABS: 7.6 10*3/uL (ref 1.7–7.7)
Neutrophils Relative %: 77 %
Platelets: 490 10*3/uL — ABNORMAL HIGH (ref 150–400)
RBC: 3.53 MIL/uL — AB (ref 3.87–5.11)
RDW: 16.3 % — ABNORMAL HIGH (ref 11.5–15.5)
WBC: 10 10*3/uL (ref 4.0–10.5)

## 2015-08-10 LAB — PROTIME-INR
INR: 1.25
PROTHROMBIN TIME: 15.8 s — AB (ref 11.4–15.2)

## 2015-08-10 LAB — APTT: APTT: 34 s (ref 24–36)

## 2015-08-10 MED ORDER — LIDOCAINE HCL 1 % IJ SOLN
INTRAMUSCULAR | Status: AC
  Filled 2015-08-10: qty 20

## 2015-08-10 MED ORDER — MIDAZOLAM HCL 2 MG/2ML IJ SOLN
INTRAMUSCULAR | Status: AC
  Filled 2015-08-10: qty 6

## 2015-08-10 MED ORDER — HEPARIN SOD (PORK) LOCK FLUSH 100 UNIT/ML IV SOLN
INTRAVENOUS | Status: AC
  Administered 2015-08-10: 500 [IU]
  Filled 2015-08-10: qty 5

## 2015-08-10 MED ORDER — MIDAZOLAM HCL 2 MG/2ML IJ SOLN
INTRAMUSCULAR | Status: AC | PRN
  Administered 2015-08-10 (×3): 1 mg via INTRAVENOUS

## 2015-08-10 MED ORDER — FENTANYL CITRATE (PF) 100 MCG/2ML IJ SOLN
INTRAMUSCULAR | Status: AC | PRN
  Administered 2015-08-10: 25 ug via INTRAVENOUS
  Administered 2015-08-10 (×2): 50 ug via INTRAVENOUS

## 2015-08-10 MED ORDER — CEFAZOLIN SODIUM-DEXTROSE 2-4 GM/100ML-% IV SOLN
2.0000 g | INTRAVENOUS | Status: AC
  Administered 2015-08-10: 2 g via INTRAVENOUS
  Filled 2015-08-10: qty 100

## 2015-08-10 MED ORDER — SODIUM CHLORIDE 0.9 % IV SOLN
INTRAVENOUS | Status: DC
  Administered 2015-08-10: 08:00:00 via INTRAVENOUS

## 2015-08-10 MED ORDER — LIDOCAINE HCL 1 % IJ SOLN
INTRAMUSCULAR | Status: AC | PRN
  Administered 2015-08-10 (×2): 20 mL

## 2015-08-10 MED ORDER — FENTANYL CITRATE (PF) 100 MCG/2ML IJ SOLN
INTRAMUSCULAR | Status: AC
  Filled 2015-08-10: qty 4

## 2015-08-10 NOTE — Procedures (Signed)
Interventional Radiology Procedure Note  Procedure: Placement of a right IJ approach single lumen PowerPort.  Tip is positioned at the superior cavoatrial junction and catheter is ready for immediate use.  Complications: No immediate Recommendations:  - Ok to shower tomorrow - Do not submerge for 7 days - Routine line care   Signed,  Aaryanna Hyden K. Jett Fukuda, MD   

## 2015-08-10 NOTE — Discharge Instructions (Signed)
Moderate Conscious Sedation, Adult, Care After °Refer to this sheet in the next few weeks. These instructions provide you with information on caring for yourself after your procedure. Your health care provider may also give you more specific instructions. Your treatment has been planned according to current medical practices, but problems sometimes occur. Call your health care provider if you have any problems or questions after your procedure. °WHAT TO EXPECT AFTER THE PROCEDURE  °After your procedure: °· You may feel sleepy, clumsy, and have poor balance for several hours. °· Vomiting may occur if you eat too soon after the procedure. °HOME CARE INSTRUCTIONS °· Do not participate in any activities where you could become injured for at least 24 hours. Do not: °¨ Drive. °¨ Swim. °¨ Ride a bicycle. °¨ Operate heavy machinery. °¨ Cook. °¨ Use power tools. °¨ Climb ladders. °¨ Work from a high place. °· Do not make important decisions or sign legal documents until you are improved. °· If you vomit, drink water, juice, or soup when you can drink without vomiting. Make sure you have little or no nausea before eating solid foods. °· Only take over-the-counter or prescription medicines for pain, discomfort, or fever as directed by your health care provider. °· Make sure you and your family fully understand everything about the medicines given to you, including what side effects may occur. °· You should not drink alcohol, take sleeping pills, or take medicines that cause drowsiness for at least 24 hours. °· If you smoke, do not smoke without supervision. °· If you are feeling better, you may resume normal activities 24 hours after you were sedated. °· Keep all appointments with your health care provider. °SEEK MEDICAL CARE IF: °· Your skin is pale or bluish in color. °· You continue to feel nauseous or vomit. °· Your pain is getting worse and is not helped by medicine. °· You have bleeding or swelling. °· You are still  sleepy or feeling clumsy after 24 hours. °SEEK IMMEDIATE MEDICAL CARE IF: °· You develop a rash. °· You have difficulty breathing. °· You develop any type of allergic problem. °· You have a fever. °MAKE SURE YOU: °· Understand these instructions. °· Will watch your condition. °· Will get help right away if you are not doing well or get worse. °  °This information is not intended to replace advice given to you by your health care provider. Make sure you discuss any questions you have with your health care provider. °  °Document Released: 10/08/2012 Document Revised: 01/08/2014 Document Reviewed: 10/08/2012 °Elsevier Interactive Patient Education ©2016 Elsevier Inc. °Implanted Port Insertion, Care After °Refer to this sheet in the next few weeks. These instructions provide you with information on caring for yourself after your procedure. Your health care provider may also give you more specific instructions. Your treatment has been planned according to current medical practices, but problems sometimes occur. Call your health care provider if you have any problems or questions after your procedure. °WHAT TO EXPECT AFTER THE PROCEDURE °After your procedure, it is typical to have the following:  °· Discomfort at the port insertion site. Ice packs to the area will help. °· Bruising on the skin over the port. This will subside in 3-4 days. °HOME CARE INSTRUCTIONS °· After your port is placed, you will get a manufacturer's information card. The card has information about your port. Keep this card with you at all times.   °· Know what kind of port you have. There are many types   of ports available.   °· Wear a medical alert bracelet in case of an emergency. This can help alert health care workers that you have a port.   °· The port can stay in for as long as your health care provider believes it is necessary.   °· A home health care nurse may give medicines and take care of the port.   °· You or a family member can get  special training and directions for giving medicine and taking care of the port at home.   °SEEK MEDICAL CARE IF:  °· Your port does not flush or you are unable to get a blood return.   °· You have a fever or chills. °SEEK IMMEDIATE MEDICAL CARE IF: °· You have new fluid or pus coming from your incision.   °· You notice a bad smell coming from your incision site.   °· You have swelling, pain, or more redness at the incision or port site.   °· You have chest pain or shortness of breath. °  °This information is not intended to replace advice given to you by your health care provider. Make sure you discuss any questions you have with your health care provider. °  °Document Released: 10/08/2012 Document Revised: 12/23/2012 Document Reviewed: 10/08/2012 °Elsevier Interactive Patient Education ©2016 Elsevier Inc. °Implanted Port Home Guide °An implanted port is a type of central line that is placed under the skin. Central lines are used to provide IV access when treatment or nutrition needs to be given through a person's veins. Implanted ports are used for long-term IV access. An implanted port may be placed because:  °· You need IV medicine that would be irritating to the small veins in your hands or arms.   °· You need long-term IV medicines, such as antibiotics.   °· You need IV nutrition for a long period.   °· You need frequent blood draws for lab tests.   °· You need dialysis.   °Implanted ports are usually placed in the chest area, but they can also be placed in the upper arm, the abdomen, or the leg. An implanted port has two main parts:  °· Reservoir. The reservoir is round and will appear as a small, raised area under your skin. The reservoir is the part where a needle is inserted to give medicines or draw blood.   °· Catheter. The catheter is a thin, flexible tube that extends from the reservoir. The catheter is placed into a large vein. Medicine that is inserted into the reservoir goes into the catheter and  then into the vein.   °HOW WILL I CARE FOR MY INCISION SITE? °Do not get the incision site wet. Bathe or shower as directed by your health care provider.  °HOW IS MY PORT ACCESSED? °Special steps must be taken to access the port:  °· Before the port is accessed, a numbing cream can be placed on the skin. This helps numb the skin over the port site.   °· Your health care provider uses a sterile technique to access the port. °· Your health care provider must put on a mask and sterile gloves. °· The skin over your port is cleaned carefully with an antiseptic and allowed to dry. °· The port is gently pinched between sterile gloves, and a needle is inserted into the port. °· Only "non-coring" port needles should be used to access the port. Once the port is accessed, a blood return should be checked. This helps ensure that the port is in the vein and is not clogged.   °· If your port   needs to remain accessed for a constant infusion, a clear (transparent) bandage will be placed over the needle site. The bandage and needle will need to be changed every week, or as directed by your health care provider.   °· Keep the bandage covering the needle clean and dry. Do not get it wet. Follow your health care provider's instructions on how to take a shower or bath while the port is accessed.   °· If your port does not need to stay accessed, no bandage is needed over the port.   °WHAT IS FLUSHING? °Flushing helps keep the port from getting clogged. Follow your health care provider's instructions on how and when to flush the port. Ports are usually flushed with saline solution or a medicine called heparin. The need for flushing will depend on how the port is used.  °· If the port is used for intermittent medicines or blood draws, the port will need to be flushed:   °· After medicines have been given.   °· After blood has been drawn.   °· As part of routine maintenance.   °· If a constant infusion is running, the port may not need to  be flushed.   °HOW LONG WILL MY PORT STAY IMPLANTED? °The port can stay in for as long as your health care provider thinks it is needed. When it is time for the port to come out, surgery will be done to remove it. The procedure is similar to the one performed when the port was put in.  °WHEN SHOULD I SEEK IMMEDIATE MEDICAL CARE? °When you have an implanted port, you should seek immediate medical care if:  °· You notice a bad smell coming from the incision site.   °· You have swelling, redness, or drainage at the incision site.   °· You have more swelling or pain at the port site or the surrounding area.   °· You have a fever that is not controlled with medicine. °  °This information is not intended to replace advice given to you by your health care provider. Make sure you discuss any questions you have with your health care provider. °  °Document Released: 12/18/2004 Document Revised: 10/08/2012 Document Reviewed: 08/25/2012 °Elsevier Interactive Patient Education ©2016 Elsevier Inc. ° °

## 2015-08-10 NOTE — H&P (Signed)
Chief Complaint: ovarian cancer  Referring Physician:Dr. Tharon Aquas Trigt  Supervising Physician: Jacqulynn Cadet  Patient Status:  Out-pt  HPI: [name] is an 60 y.o. female with a history of ovarian cancer.  She underwent chemotherapy treatment in 2011.  She had a PAC at that time for 1-2 years according to the patient.  She has been found to have metastatic disease in her chest, lungs, bones, etc.  She is followed by Dr. Prescott Gum and Dr. Marko Plume.  She is currently being set up to undergo more chemotherapy and so a request has been made for a new PAC to be placed.  She states that she has had a dry cough over the last 3 weeks since this mass in her chest has grown.  She denies any fevers at home.  She denies dysuria, frequency, or urgency.  She denies any other infectious symptoms.  Otherwise, she is weak and tired.  Past Medical History:  Past Medical History:  Diagnosis Date  . Chemotherapy-induced neuropathy ([place])   . DVT, lower extremity ([place])    left  02-12-2015  currently on Eliqius  . Endometrial cancer [facility]) oncologist-  dr Marko Plume   02-2015  . 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 Family history of breast cancer   . History of adenomatous polyp of colon    tubulovillious adenoma 09/ 2010  . History of ovarian cyst    complex  . History of radiation therapy 10/21/08-11/23/08 & 12/07/10/13/12/23 2010   ENDOMETRIOID   . History of uterine fibroid   . Hypertension   . Inguinal fluid collection 02/2015   right-drained x2  . Iron deficiency anemia    chronic severe  . Metastasis to lung ([place])   . 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 ABDOMINAL HYSTERECTOMY    . APPLICATION OF WOUND VAC Right 05/12/2015   Procedure: APPLICATION OF WOUND VAC;  Surgeon: Hall Busing, MD;  Location: [facility];  Service: General;  Laterality: Right;  . COLONOSCOPY W/  POLYPECTOMY  09-27-2008  . EXPLORATORY LAPAROTOMY /  TOTAL ABDOMINAL HYSTERECTOMY/ BILATERAL SALPINOOPHORECTOMY/  PARTIAL OMENTECTOMY  03-18-2008  . INGUINAL HERNIA REPAIR Right 05/12/2015   Procedure: INCISION AND DRAINGE RIGHT INGUINAL FLUID COLLECTION ;  Surgeon: Hall Busing, MD;  Location: [facility];  Service: General;  Laterality: Right;  . porta cath  06/2010   removal  . TUBAL LIGATION  1990's  . UMBILICAL HERNIA REPAIR     infant    Family History:  Family History  Problem Relation Age of Onset  . Bone cancer Paternal Grandmother     OSSEOUS METASTASIS  . Heart disease Father   . Heart disease Sister   . Bone cancer Maternal Grandmother 75  . Leukemia Maternal Grandfather 12  . Liver cancer Maternal Uncle   . Breast cancer Cousin 25    maternal first cousin    Social History:  reports that she has never smoked. She has never used smokeless tobacco. She reports that she does not drink alcohol or use drugs.  Allergies: No Known Allergies  Medications: Medications reviewed in Epic  Please HPI for pertinent positives, otherwise complete 10 system ROS negative.  Mallampati Score: MD Evaluation Airway: WNL Heart: WNL Abdomen: WNL Chest/ Lungs: WNL ASA  Classification: 3 Mallampati/Airway Score: Two  Physical Exam: BP 125/65 (BP Location: Right Arm)  Pulse 80   Temp 100.1 F (37.8 C) (Oral)   Resp 16   Ht 5' 2"  (1.575 m)   Wt 164 lb 8 oz (74.6 kg)   SpO2 98%   BMI 30.09 kg/m  Body mass index is 30.09 kg/m. General: pleasant, obese black female who is laying in bed in NAD HEENT: head is normocephalic, atraumatic.  Sclera are noninjected.  PERRL.  Ears and nose without any masses or lesions.  Mouth is pink and moist Heart: regular, rate, and rhythm.  Normal s1,s2. No obvious murmurs, gallops, or rubs noted.  Palpable radial and pedal pulses bilaterally Lungs: CTAB, no wheezes, rhonchi, or rales noted.  Respiratory effort nonlabored Abd:  soft, NT, ND, +BS, no masses, hernias, or organomegaly MS: all 4 extremities are symmetrical with no cyanosis, clubbing, or edema. Psych: A&Ox3 with a somewhat depressed affect   Labs: Results for orders placed or performed during the hospital encounter of 08/10/15 (from the past 48 hour(s))  APTT     Status: None   Collection Time: 08/10/15  7:50 AM  Result Value Ref Range   aPTT 34 24 - 36 seconds  Basic metabolic panel     Status: Abnormal   Collection Time: 08/10/15  7:50 AM  Result Value Ref Range   Sodium 135 135 - 145 mmol/L   Potassium 3.8 3.5 - 5.1 mmol/L   Chloride 100 (L) 101 - 111 mmol/L   CO2 25 22 - 32 mmol/L   Glucose, Bld 130 (H) 65 - 99 mg/dL   BUN 14 6 - 20 mg/dL   Creatinine, Ser 1.01 (H) 0.44 - 1.00 mg/dL   Calcium 9.3 8.9 - 10.3 mg/dL   GFR calc non Af Amer 58 (L) >60 mL/min   GFR calc Af Amer >60 >60 mL/min    Comment: (NOTE) The eGFR has been calculated using the CKD EPI equation. This calculation has not been validated in all clinical situations. eGFR's persistently <60 mL/min signify possible Chronic Kidney Disease.    Anion gap 10 5 - 15  CBC with Differential/Platelet     Status: Abnormal   Collection Time: 08/10/15  7:50 AM  Result Value Ref Range   WBC 10.0 4.0 - 10.5 K/uL   RBC 3.53 (L) 3.87 - 5.11 MIL/uL   Hemoglobin 9.0 (L) 12.0 - 15.0 g/dL   HCT 28.0 (L) 36.0 - 46.0 %   MCV 79.3 78.0 - 100.0 fL   MCH 25.5 (L) 26.0 - 34.0 pg   MCHC 32.1 30.0 - 36.0 g/dL   RDW 16.3 (H) 11.5 - 15.5 %   Platelets 490 (H) 150 - 400 K/uL   Neutrophils Relative % 77 %   Neutro Abs 7.6 1.7 - 7.7 K/uL   Lymphocytes Relative 11 %   Lymphs Abs 1.1 0.7 - 4.0 K/uL   Monocytes Relative 11 %   Monocytes Absolute 1.1 (H) 0.1 - 1.0 K/uL   Eosinophils Relative 1 %   Eosinophils Absolute 0.1 0.0 - 0.7 K/uL   Basophils Relative 0 %   Basophils Absolute 0.0 0.0 - 0.1 K/uL  Protime-INR     Status: Abnormal   Collection Time: 08/10/15  7:50 AM  Result Value Ref Range     Prothrombin Time 15.8 (H) 11.4 - 15.2 seconds   INR 1.25     Imaging: No results found.  Assessment/Plan 1. Stage 4 ovarian cancer -we will plan for placement of a PAC today.  Initially, a lung biopsy was ordered as well,  but this has been cancelled. -labs and vitals have been reviewed.  Her temp is 100.3.  She has no infectious symptoms and her WBC is normal at 10 with no left shift.  D/W Dr. Laurence Ferrari.  We suspect this may be related to tumor burden. -Risks and Benefits discussed with the patient including, but not limited to bleeding, infection, pneumothorax, or fibrin sheath development and need for additional procedures. All of the patient's questions were answered, patient is agreeable to proceed. Consent signed and in chart.   Thank you for this interesting consult.  I greatly enjoyed meeting [name] and look forward to participating in their care.  A copy of this report was sent to the requesting provider on this date.  Electronically Signed: Henreitta Cea 08/10/2015, 8:50 AM   I spent a total of  30 Minutes   in face to face in clinical consultation, greater than 50% of which was counseling/coordinating care for ovarian cancer

## 2015-08-11 ENCOUNTER — Other Ambulatory Visit (HOSPITAL_BASED_OUTPATIENT_CLINIC_OR_DEPARTMENT_OTHER): Payer: Medicaid Other

## 2015-08-11 ENCOUNTER — Ambulatory Visit: Payer: Medicaid Other | Admitting: Nutrition

## 2015-08-11 ENCOUNTER — Ambulatory Visit (HOSPITAL_BASED_OUTPATIENT_CLINIC_OR_DEPARTMENT_OTHER): Payer: Medicaid Other

## 2015-08-11 ENCOUNTER — Telehealth: Payer: Self-pay

## 2015-08-11 ENCOUNTER — Ambulatory Visit (HOSPITAL_COMMUNITY): Payer: Medicaid Other

## 2015-08-11 VITALS — BP 145/67 | HR 83 | Temp 98.2°F | Resp 16

## 2015-08-11 DIAGNOSIS — C801 Malignant (primary) neoplasm, unspecified: Secondary | ICD-10-CM | POA: Diagnosis present

## 2015-08-11 DIAGNOSIS — C7802 Secondary malignant neoplasm of left lung: Secondary | ICD-10-CM

## 2015-08-11 DIAGNOSIS — Z5111 Encounter for antineoplastic chemotherapy: Secondary | ICD-10-CM

## 2015-08-11 DIAGNOSIS — C7801 Secondary malignant neoplasm of right lung: Secondary | ICD-10-CM | POA: Diagnosis not present

## 2015-08-11 DIAGNOSIS — C569 Malignant neoplasm of unspecified ovary: Secondary | ICD-10-CM

## 2015-08-11 DIAGNOSIS — C541 Malignant neoplasm of endometrium: Secondary | ICD-10-CM

## 2015-08-11 LAB — CBC WITH DIFFERENTIAL/PLATELET
BASO%: 0.5 % (ref 0.0–2.0)
BASOS ABS: 0 10*3/uL (ref 0.0–0.1)
EOS%: 2.2 % (ref 0.0–7.0)
Eosinophils Absolute: 0.2 10*3/uL (ref 0.0–0.5)
HEMATOCRIT: 29.3 % — AB (ref 34.8–46.6)
HEMOGLOBIN: 9.3 g/dL — AB (ref 11.6–15.9)
LYMPH#: 0.9 10*3/uL (ref 0.9–3.3)
LYMPH%: 11.6 % — ABNORMAL LOW (ref 14.0–49.7)
MCH: 25.4 pg (ref 25.1–34.0)
MCHC: 31.7 g/dL (ref 31.5–36.0)
MCV: 80.1 fL (ref 79.5–101.0)
MONO#: 0.7 10*3/uL (ref 0.1–0.9)
MONO%: 9.6 % (ref 0.0–14.0)
NEUT#: 5.6 10*3/uL (ref 1.5–6.5)
NEUT%: 76.1 % (ref 38.4–76.8)
Platelets: 414 10*3/uL — ABNORMAL HIGH (ref 145–400)
RBC: 3.66 10*6/uL — ABNORMAL LOW (ref 3.70–5.45)
RDW: 16.4 % — ABNORMAL HIGH (ref 11.2–14.5)
WBC: 7.4 10*3/uL (ref 3.9–10.3)

## 2015-08-11 LAB — COMPREHENSIVE METABOLIC PANEL
ALBUMIN: 2.5 g/dL — AB (ref 3.5–5.0)
ALK PHOS: 86 U/L (ref 40–150)
ALT: <9 U/L (ref 0–55)
AST: 7 U/L (ref 5–34)
Anion Gap: 9 mEq/L (ref 3–11)
BILIRUBIN TOTAL: 0.5 mg/dL (ref 0.20–1.20)
BUN: 11.5 mg/dL (ref 7.0–26.0)
CO2: 27 mEq/L (ref 22–29)
CREATININE: 1 mg/dL (ref 0.6–1.1)
Calcium: 10 mg/dL (ref 8.4–10.4)
Chloride: 102 mEq/L (ref 98–109)
EGFR: 71 mL/min/{1.73_m2} — ABNORMAL LOW (ref >90)
GLUCOSE: 116 mg/dL (ref 70–140)
Potassium: 3.7 mEq/L (ref 3.5–5.1)
SODIUM: 139 meq/L (ref 136–145)
TOTAL PROTEIN: 7.5 g/dL (ref 6.4–8.3)

## 2015-08-11 MED ORDER — SODIUM CHLORIDE 0.9 % IV SOLN
800.0000 mg/m2 | Freq: Once | INTRAVENOUS | Status: AC
  Administered 2015-08-11: 1444 mg via INTRAVENOUS
  Filled 2015-08-11: qty 37.98

## 2015-08-11 MED ORDER — SODIUM CHLORIDE 0.9% FLUSH
10.0000 mL | INTRAVENOUS | Status: DC | PRN
  Administered 2015-08-11: 10 mL
  Filled 2015-08-11: qty 10

## 2015-08-11 MED ORDER — ACETAMINOPHEN 325 MG PO TABS
325.0000 mg | ORAL_TABLET | Freq: Once | ORAL | Status: AC
  Administered 2015-08-11: 325 mg via ORAL

## 2015-08-11 MED ORDER — HEPARIN SOD (PORK) LOCK FLUSH 100 UNIT/ML IV SOLN
500.0000 [IU] | Freq: Once | INTRAVENOUS | Status: AC | PRN
  Administered 2015-08-11: 500 [IU]
  Filled 2015-08-11: qty 5

## 2015-08-11 MED ORDER — ACETAMINOPHEN 325 MG PO TABS
ORAL_TABLET | ORAL | Status: AC
  Filled 2015-08-11: qty 1

## 2015-08-11 MED ORDER — PROCHLORPERAZINE MALEATE 10 MG PO TABS
10.0000 mg | ORAL_TABLET | Freq: Once | ORAL | Status: AC
  Administered 2015-08-11: 10 mg via ORAL

## 2015-08-11 MED ORDER — SODIUM CHLORIDE 0.9 % IV SOLN
Freq: Once | INTRAVENOUS | Status: AC
  Administered 2015-08-11: 13:00:00 via INTRAVENOUS

## 2015-08-11 MED ORDER — PROCHLORPERAZINE MALEATE 10 MG PO TABS
ORAL_TABLET | ORAL | Status: AC
  Filled 2015-08-11: qty 1

## 2015-08-11 NOTE — Patient Instructions (Signed)
Gemzar Gemcitabine injection What is this medicine? GEMCITABINE (jem SIT a been) is a chemotherapy drug. This medicine is used to treat many types of cancer like breast cancer, lung cancer, pancreatic cancer, and ovarian cancer. This medicine may be used for other purposes; ask your health care provider or pharmacist if you have questions. What should I tell my health care provider before I take this medicine? They need to know if you have any of these conditions: -blood disorders -infection -kidney disease -liver disease -recent or ongoing radiation therapy -an unusual or allergic reaction to gemcitabine, other chemotherapy, other medicines, foods, dyes, or preservatives -pregnant or trying to get pregnant -breast-feeding How should I use this medicine? This drug is given as an infusion into a vein. It is administered in a hospital or clinic by a specially trained health care professional. Talk to your pediatrician regarding the use of this medicine in children. Special care may be needed. Overdosage: If you think you have taken too much of this medicine contact a poison control center or emergency room at once. NOTE: This medicine is only for you. Do not share this medicine with others. What if I miss a dose? It is important not to miss your dose. Call your doctor or health care professional if you are unable to keep an appointment. What may interact with this medicine? -medicines to increase blood counts like filgrastim, pegfilgrastim, sargramostim -some other chemotherapy drugs like cisplatin -vaccines Talk to your doctor or health care professional before taking any of these medicines: -acetaminophen -aspirin -ibuprofen -ketoprofen -naproxen This list may not describe all possible interactions. Give your health care provider a list of all the medicines, herbs, non-prescription drugs, or dietary supplements you use. Also tell them if you smoke, drink alcohol, or use illegal drugs.  Some items may interact with your medicine. What should I watch for while using this medicine? Visit your doctor for checks on your progress. This drug may make you feel generally unwell. This is not uncommon, as chemotherapy can affect healthy cells as well as cancer cells. Report any side effects. Continue your course of treatment even though you feel ill unless your doctor tells you to stop. In some cases, you may be given additional medicines to help with side effects. Follow all directions for their use. Call your doctor or health care professional for advice if you get a fever, chills or sore throat, or other symptoms of a cold or flu. Do not treat yourself. This drug decreases your body's ability to fight infections. Try to avoid being around people who are sick. This medicine may increase your risk to bruise or bleed. Call your doctor or health care professional if you notice any unusual bleeding. Be careful brushing and flossing your teeth or using a toothpick because you may get an infection or bleed more easily. If you have any dental work done, tell your dentist you are receiving this medicine. Avoid taking products that contain aspirin, acetaminophen, ibuprofen, naproxen, or ketoprofen unless instructed by your doctor. These medicines may hide a fever. Women should inform their doctor if they wish to become pregnant or think they might be pregnant. There is a potential for serious side effects to an unborn child. Talk to your health care professional or pharmacist for more information. Do not breast-feed an infant while taking this medicine. What side effects may I notice from receiving this medicine? Side effects that you should report to your doctor or health care professional as soon as possible: -  allergic reactions like skin rash, itching or hives, swelling of the face, lips, or tongue -low blood counts - this medicine may decrease the number of white blood cells, red blood cells and  platelets. You may be at increased risk for infections and bleeding. -signs of infection - fever or chills, cough, sore throat, pain or difficulty passing urine -signs of decreased platelets or bleeding - bruising, pinpoint red spots on the skin, black, tarry stools, blood in the urine -signs of decreased red blood cells - unusually weak or tired, fainting spells, lightheadedness -breathing problems -chest pain -mouth sores -nausea and vomiting -pain, swelling, redness at site where injected -pain, tingling, numbness in the hands or feet -stomach pain -swelling of ankles, feet, hands -unusual bleeding Side effects that usually do not require medical attention (report to your doctor or health care professional if they continue or are bothersome): -constipation -diarrhea -hair loss -loss of appetite -stomach upset This list may not describe all possible side effects. Call your doctor for medical advice about side effects. You may report side effects to FDA at 1-800-FDA-1088. Where should I keep my medicine? This drug is given in a hospital or clinic and will not be stored at home. NOTE: This sheet is a summary. It may not cover all possible information. If you have questions about this medicine, talk to your doctor, pharmacist, or health care provider.    2016, Elsevier/[employer] Standard. (2007-04-29 18:45:54)

## 2015-08-11 NOTE — Progress Notes (Signed)
60 year old female diagnosed with metastatic ovarian versus endometrial cancer.  She is a patient of Dr. Marko Plume.  Past medical history includes iron deficiency anemia, hypertension, and DVT.  Medications include Decadron, ferrous sulfate, Ativan, Protonix, and MiraLAX.  Labs were reviewed.  Height: 5 feet 2 inches. Weight: 164.5 pounds August 3. Usual body weight: 174 pounds May 2017. BMI: 30.09.  Patient was identified to be at risk for malnutrition on the MST secondary to poor appetite and weight loss. Patient does endorse poor appetite.   She has nausea which is improved when she takes Ativan, however she is only taking Ativan once daily. She is drinking one to 2 bottles of Ensure Plus a day. Patient has lost 6% body weight since May 2017.  Nutrition diagnosis:  Food and nutrition related knowledge deficit related to metastatic cancer as evidenced by 6% weight loss over 3 months.  Intervention:  I educated patient on strategies for eating with nausea and vomiting.  Provided fact sheet. Encouraged patient to consume small frequent meals and snacks. Recommended increase Ensure Plus 3 times a day between meals. Questions were answered.  Teach back method used.  Contact information given.  Monitoring, evaluation, goals:  Patient will tolerate increased calories and protein to minimize further weight loss.  Next visit: Patient will contact me for questions or concerns.

## 2015-08-11 NOTE — Telephone Encounter (Signed)
While pt was in infusion the zometa teaching was done, [facility] note for zometa given to pt and gone over.

## 2015-08-11 NOTE — Telephone Encounter (Signed)
-----   Message from Gordy Levan, MD sent at 08/07/2015 12:41 PM EDT ----- I would like to give her zometa with my visit on 8-17, for bone involvement and as calcium is slightly elevated on most recent chemistries.   Need to let patient know and do teaching, fine to do teaching when she is here for first gemzar on 8-10 if infusion RN can do that.   POF and orders in thanks

## 2015-08-12 ENCOUNTER — Ambulatory Visit: Payer: Medicaid Other

## 2015-08-12 NOTE — Progress Notes (Addendum)
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 Additional left upper lobe/ left lower lobe masses and thoracic lymphadenopathy.  [name] presented with symptoms of: mass was found on a CT scan from 07/27/15.  Patient reports she did not have any symptoms.  Biopsies revealed:   05/12/15 Diagnosis Debridement, right inguinal - ADENOCARCINOMA ASSOCIATED WITH HEMATOMA. - SEE MICROSCOPIC DESCRIPTION.  03/18/08 FINAL DIAGNOSIS  MICROSCOPIC EXAMINATION AND DIAGNOSIS  1. RIGHT OVARY, EXCISION: - HIGH GRADE SURFACE EPITHELIAL CARCINOMA, 2.7 CM, CONFINED WITHIN RIGHT OVARY. - NO ANGIOLYMPHATIC INVASION IDENTIFIED. - MATURE CYSTIC TERATOMA. - FALLOPIAN TUBE: NO PATHOLOGIC ABNORMALITIES. - PLEASE SEE ONCOLOGY TEMPLATE  2. 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 - ANGIOLYMPHATIC INVASION PRESENT. - LEIOMYOMATA. - CERVIX: BENIGN SQUAMOUS MUCOSA AND ENDOCERVICAL MUCOSA, NO DYSPLASIA OR MALIGNANCY IDENTIFIED.  LEFT OVARY: - HIGH GRADE SURFACE EPITHELIAL CARCINOMA, 5.5 CM, CAPSULE RUPTURED; NO ANGIOLYMPHATIC INVASION SEEN. - FALLOPIAN TUBAL TISSUE WITH PARATUBAL CYST. - PLEASE SEE ONCOLOGY TEMPLATE.  3. OMENTUM, RESECTION: NEGATIVE FOR MALIGNANCY.  Past/Anticipated interventions by Gyn/Onc surgery, if any: 03/18/08 - EXPLORATORY LAPAROTOMY /  TOTAL ABDOMINAL HYSTERECTOMY/ BILATERAL SALPINOOPHORECTOMY/  PARTIAL OMENTECTOMY [Other]  Past/Anticipated interventions by medical oncology, if any: has completed adjuvant carboplatin taxol x 6 cycles.  Now will start gemzar every other week.  Patient will have her next cycle of chemotherapy next Thursday.  Weight changes, if any: has lost 8 lbs in the last few months.  Respiratory  issues:  Patient reports having a frequent dry cough that started about a week ago.  She reports having shortness of breath that started in the last week.  Pain issues, if any:  10/10 in her left chest.  She has been taking morphine 15 mg as needed.  She reports she has taken 1 about every other day and does not help the pain.  SAFETY ISSUES:  Prior radiation? Yes - 10/21/08 - 11/23/08 external beam 4500 cGy to pelvis, 12/06/08, 12/13/08, 12/23/08 - 1800 cGy - bracytherapy    Pacemaker/ICD? no  Possible current pregnancy? no  Is the patient on methotrexate? no  Current Complaints / other details:  Dr. Marko Plume is recommending radiation for the progressive central chest mass.  Patient is here with her daughter and aunt.  BP 140/70 (BP Location: Right Arm, Patient Position: Sitting)   Pulse 82   Temp 98.9 F (37.2 C) (Oral)   Ht 5\' 2"  (1.575 m)   Wt 162 lb 14.4 oz (73.9 kg)   SpO2 100%   BMI 29.79 kg/m    Wt Readings from Last 3 Encounters:  08/15/15 162 lb 14.4 oz (73.9 kg)  08/10/15 164 lb 8 oz (74.6 kg)  08/04/15 164 lb 8 oz (74.6 kg)

## 2015-08-15 ENCOUNTER — Ambulatory Visit
Admission: RE | Admit: 2015-08-15 | Discharge: 2015-08-15 | Disposition: A | Discharge status: Home / Self Care | Payer: Medicaid Other | Source: Ambulatory Visit | Attending: Radiation Oncology | Admitting: Radiation Oncology

## 2015-08-15 ENCOUNTER — Encounter: Payer: Self-pay | Admitting: Radiation Oncology

## 2015-08-15 VITALS — BP 140/70 | HR 82 | Temp 98.9°F | Ht 62.0 in | Wt 162.9 lb

## 2015-08-15 DIAGNOSIS — C569 Malignant neoplasm of unspecified ovary: Secondary | ICD-10-CM | POA: Insufficient documentation

## 2015-08-15 DIAGNOSIS — Z510 Encounter for antineoplastic radiation therapy: Secondary | ICD-10-CM | POA: Insufficient documentation

## 2015-08-15 MED ORDER — HYDROCOD POLST-CPM POLST ER 10-8 MG/5ML PO SUER: ORAL | 0 refills | Status: DC

## 2015-08-15 NOTE — Progress Notes (Signed)
Radiation Oncology         (336) [phone] ________________________________  Name: [name] MRN: [id number]  Date: 08/15/2015  DOB: 12/17/1955  Follow-Up Visit Note  CC: No PCP Per Patient  Gordy Levan, MD    ICD-9-CM ICD-10-CM   1. [employer] Federation of Gyne[employer] and Obstetrics (FIGO) stage IVB epithelial ovarian cancer ([place]) 183.0 C56.9 chlorpheniramine-HYDROcodone ([place]) 10-8 MG/5ML SUER    Diagnosis:   Metastatic ovarian cancer  Interval Since Last Radiation: 6-1/2 years. The patient completed external beam radiation therapy and high-dose rate brachytherapy as part of management of her stage IC endometrioid adenocarcinoma.  The patient completed her radiation treatments on 12/23/2008.   Narrative:  The patient returns today for reevaluation and consideration for additional radiation therapy at the courtesy of Dr. Marko Plume. The patient has biopsy ovarian cancer proven on biopsy of a right inguinal node 05/06/2015. For the patient's metastatic ovarian cancer she was treated with carboplatin and Taxotere. Her treatments were stopped in light of recent findings of disease progression on PET scan 08/01/2015.  Patient is been having pain in the left chest area and PET scan shows a large mass in the left  upper lobe measuring approximately 5.2 x 3.6 cm with an additional 7.8 x 7.5 cm necrotic mass along the medial left upper lobe abutting the mediastinum. For the past few days patient pain has worsened in intensity after coughing episode. Uncomfortable for the patient take a deep breath. She has to lie on her left side sleep at night. Radiation therapy is consulted for consideration for palliative treatment for her large left lung mass resulting in issues as above.                              ALLERGIES:  has No Known Allergies.  Meds: Current Outpatient Prescriptions  Medication Sig Dispense Refill  . apixaban (ELIQUIS) 5 MG TABS tablet Take 1 tablet (5 mg total) by  mouth 2 (two) times daily. 60 tablet 0  . benzonatate (TESSALON) 100 MG capsule Take 1 capsule (100 mg total) by mouth 3 (three) times daily as needed for cough. 30 capsule 0  . cloNIDine (CATAPRES) 0.1 MG tablet Take 0.1 mg by mouth daily.     . ferrous sulfate 325 (65 FE) MG tablet Take 1 tablet (325 mg total) by mouth 2 (two) times daily with a meal. 60 tablet 3  . hydrochlorothiazide (HYDRODIURIL) 25 MG tablet Take 25 mg by mouth daily.      Marland Kitchen labetalol (NORMODYNE) 100 MG tablet Take 2 tablets (200 mg total) by mouth 3 (three) times daily. 180 tablet 0  . LORazepam (ATIVAN) 0.5 MG tablet Place 1 tablet under the tongue or swallow every 6 hrs as needed for nausea. 30 tablet 0  . morphine (MSIR) 15 MG tablet Take 1 tablet (15 mg total) by mouth every 6 (six) hours as needed for moderate pain or severe pain. 30 tablet 0  . pantoprazole (PROTONIX) 40 MG tablet Take 1 tablet (40 mg total) by mouth daily. 30 tablet 2  . polyethylene glycol (MIRALAX / GLYCOLAX) packet Take 17 g by mouth 2 (two) times daily as needed. Reported on 05/23/2015    . potassium chloride (K-DUR) 10 MEQ tablet Take 2 tablets today and then 1 tablet daily 30 tablet 1  . zolpidem (AMBIEN) 5 MG tablet Take 1 tablet (5 mg total) by mouth at bedtime as needed for sleep. 30 tablet  2  . chlorpheniramine-HYDROcodone (TUSSIONEX) 10-8 MG/5ML SUER Take 5 ml by mouth every 8-12 hours as needed for cough. 473 mL 0  . dexamethasone (DECADRON) 4 MG tablet Take 2 tablets (8 mg total) by mouth 2 (two) times daily. For 3 days.  Start the day before Taxotere. (Patient not taking: Reported on 08/04/2015) 12 tablet 1  . lidocaine-prilocaine (EMLA) cream Apply to Porta-cath 1-2 hrs prior to access as directed. (Patient not taking: Reported on 08/15/2015) 30 g 1  . loratadine (CLARITIN) 10 MG tablet Take 10 mg by mouth daily. Reported on 07/07/2015     No current facility-administered medications for this encounter.     Physical Findings: The patient  is in no acute distress. Patient is alert and oriented.  height is 5\' 2"  (1.575 m) and weight is 162 lb 14.4 oz (73.9 kg). Her oral temperature is 98.9 F (37.2 C). Her blood pressure is 140/70 and her pulse is 82. Her oxygen saturation is 100%. . No palpable subclavicular or axillary adenopathy. The lungs are clear to auscultation. The heart has regular rhythm and rate.  Lab Findings: Lab Results  Component Value Date   WBC 7.4 08/11/2015   HGB 9.3 (L) 08/11/2015   HCT 29.3 (L) 08/11/2015   MCV 80.1 08/11/2015   PLT 414 (H) 08/11/2015    Radiographic Findings: Nm Pet Image Initial (pi) Skull Base To Thigh  Result Date: 08/01/2015 CLINICAL DATA:  Initial treatment strategy for hilar mass. History of endometrial cancer. EXAM: NUCLEAR MEDICINE PET SKULL BASE TO THIGH TECHNIQUE: 8.87 mCi F-18 FDG was injected intravenously. Full-ring PET imaging was performed from the skull base to thigh after the radiotracer. CT data was obtained and used for attenuation correction and anatomic localization. FASTING BLOOD GLUCOSE:  Value: 117 mg/dl COMPARISON:  CT chest abdomen pelvis dated 06/14/2015 FINDINGS: NECK No hypermetabolic lymph nodes in the neck. CHEST 5.2 x 6.3 cm mass in the posterior left upper lobe (series 4/image 58), max SUV 21.1, previously 5.1 x 5.5 cm. Additional 7.8 x 7.5 cm necrotic mass along the medial left upper lobe abutting the mediastinum (series 4/ image 87), max SUV 13.5 medially, previously 6.2 x 7.3 cm. 2.6 x 2.2 cm left lower lobe nodule (series 8/ image 38), max SUV 22.2, previously 1.9 x 1.8 cm. Thoracic lymphadenopathy, including: --dominant 3.7 x 3.4 cm necrotic prevascular node (series 4/ image 58), max SUV 7.6, previously 3.5 x 2.6 cm --7 mm short axis high right paratracheal node max SUV 8.4 --7 mm short axis AP window node, max SUV 5.1 ABDOMEN/PELVIS No abnormal hypermetabolic activity within the liver, pancreas, adrenal glands, or spleen. Abnormal soft tissue along the medial  aspect of the left psoas and along the left common iliac chain, measuring 3.0 x 2.2 cm with max SUV 7.2. This is difficult to discretely measure but likely measures approximately 2.5 x 1.5 cm on the prior. 2.0 x 3.0 cm left common iliac nodal mass (series 4/ image 163), max SUV 7.8, previously approximately 2.8 x 1.7 cm. Postsurgical changes in the right inguinal region with surgical clip (series 4/images 170-172), max SUV 6.6. SKELETON No focal hypermetabolic activity to suggest skeletal metastasis. IMPRESSION: Interval progression, as described above. Dominant 7.8 cm left upper lobe mass abuts the mediastinum. Additional left upper lobe/ left lower lobe masses and thoracic lymphadenopathy. Additional retroperitoneal/left pelvic nodal metastases. Postsurgical changes in the right inguinal region with mild residual hypermetabolism. Electronically Signed   By: Julian Hy M.D.   On: 08/01/2015  11:59  Ir Fluoro Guide Cv Line Right  Result Date: 08/10/2015 INDICATION: 60 year old female with a history of endometrial cancer and a new left hilar mass. EXAM: IMPLANTED PORT A CATH PLACEMENT WITH ULTRASOUND AND FLUOROSCOPIC GUIDANCE MEDICATIONS: 2 g Ancef; The antibiotic was administered within an appropriate time interval prior to skin puncture. ANESTHESIA/SEDATION: Versed 3 mg IV; Fentanyl 125 mcg IV; Moderate Sedation Time:  26 minutes The patient was continuously monitored during the procedure by the interventional radiology nurse under my direct supervision. FLUOROSCOPY TIME:  0 minutes, 6 seconds (1 mGy) COMPLICATIONS: None immediate. Estimated blood loss: None PROCEDURE: The right neck and chest was prepped with chlorhexidine, and draped in the usual sterile fashion using maximum barrier technique (cap and mask, sterile gown, sterile gloves, large sterile sheet, hand hygiene and cutaneous antiseptic). Antibiotic prophylaxis was provided with 2g Ancef administered IV one hour prior to skin incision. Local  anesthesia was attained by infiltration with 1% lidocaine. Ultrasound demonstrated patency of the right internal jugular vein, and this was documented with an image. Under real-time ultrasound guidance, this vein was accessed with a 21 gauge micropuncture needle and image documentation was performed. A small dermatotomy was made at the access site with an 11 scalpel. A 0.018" wire was advanced into the SVC and the access needle exchanged for a 13F micropuncture vascular sheath. The 0.018" wire was then removed and a 0.035" wire advanced into the IVC. An appropriate location for the subcutaneous reservoir was selected below the clavicle and an incision was made through the skin and underlying soft tissues. The subcutaneous tissues were then dissected using a combination of blunt and sharp surgical technique and a pocket was formed. A single lumen power injectable portacatheter was then tunneled through the subcutaneous tissues from the pocket to the dermatotomy and the port reservoir placed within the subcutaneous pocket. The venous access site was then serially dilated and a peel away vascular sheath placed over the wire. The wire was removed and the port catheter advanced into position under fluoroscopic guidance. The catheter tip is positioned in the upper right atrium. This was documented with a spot image. The portacatheter was then tested and found to flush and aspirate well. The port was flushed with saline followed by 100 units/mL heparinized saline. The pocket was then closed in two layers using first subdermal inverted interrupted absorbable sutures followed by a running subcuticular suture. The epidermis was then sealed with Dermabond. The dermatotomy at the venous access site was also sealed with Dermabond. IMPRESSION: Successful placement of a right IJ approach Power Port with ultrasound and fluoroscopic guidance. The catheter is ready for use. Signed, Criselda Peaches, MD [facility] Radiology Electronically Signed   By: Jacqulynn Cadet M.D.   On: 08/10/2015 10:38   Ir US Guide Vasc Access Right  Result Date: 08/10/2015 INDICATION: 60 year old female with a history of endometrial cancer and a new left hilar mass. EXAM: IMPLANTED PORT A CATH PLACEMENT WITH ULTRASOUND AND FLUOROSCOPIC GUIDANCE MEDICATIONS: 2 g Ancef; The antibiotic was administered within an appropriate time interval prior to skin puncture. ANESTHESIA/SEDATION: Versed 3 mg IV; Fentanyl 125 mcg IV; Moderate Sedation Time:  26 minutes The patient was continuously monitored during the procedure by the interventional radiology nurse under my direct supervision. FLUOROSCOPY TIME:  0 minutes, 6 seconds (1 mGy) COMPLICATIONS: None immediate. Estimated blood loss: None PROCEDURE: The right neck and chest was prepped with chlorhexidine, and draped in the usual sterile fashion using maximum  barrier technique (cap and mask, sterile gown, sterile gloves, large sterile sheet, hand hygiene and cutaneous antiseptic). Antibiotic prophylaxis was provided with 2g Ancef administered IV one hour prior to skin incision. Local anesthesia was attained by infiltration with 1% lidocaine. Ultrasound demonstrated patency of the right internal jugular vein, and this was documented with an image. Under real-time ultrasound guidance, this vein was accessed with a 21 gauge micropuncture needle and image documentation was performed. A small dermatotomy was made at the access site with an 11 scalpel. A 0.018" wire was advanced into the SVC and the access needle exchanged for a 22F micropuncture vascular sheath. The 0.018" wire was then removed and a 0.035" wire advanced into the IVC. An appropriate location for the subcutaneous reservoir was selected below the clavicle and an incision was made through the skin and underlying soft tissues. The subcutaneous tissues were then dissected using a combination of blunt and sharp  surgical technique and a pocket was formed. A single lumen power injectable portacatheter was then tunneled through the subcutaneous tissues from the pocket to the dermatotomy and the port reservoir placed within the subcutaneous pocket. The venous access site was then serially dilated and a peel away vascular sheath placed over the wire. The wire was removed and the port catheter advanced into position under fluoroscopic guidance. The catheter tip is positioned in the upper right atrium. This was documented with a spot image. The portacatheter was then tested and found to flush and aspirate well. The port was flushed with saline followed by 100 units/mL heparinized saline. The pocket was then closed in two layers using first subdermal inverted interrupted absorbable sutures followed by a running subcuticular suture. The epidermis was then sealed with Dermabond. The dermatotomy at the venous access site was also sealed with Dermabond. IMPRESSION: Successful placement of a right IJ approach Power Port with ultrasound and fluoroscopic guidance. The catheter is ready for use. Signed, Criselda Peaches, MD [facility] [place] Radiology Electronically Signed   By: Jacqulynn Cadet M.D.   On: 08/10/2015 10:38    Impression:  Metastatic ovarian cancer with the significant tumor burden in the left chest resulting in significant pain for the patient. She would be a candidate for palliative radiation therapy directed at these large masses within the left chest. I discussed treatment course side effects and potential toxicities of radiation therapy in this situation with the patient and her family. The patient appears to understand wishes to proceed with planned course of treatment.  I'm unsure why the patient's pain is worsened in intensity after coughing. She may have pulled a intercostal muscle or possibly caused rib fracture.  Plan:  Patient will proceed with simulation  later this week. Anticipate approximately 15 treatments.  ____________________________________ Gery Pray, MD

## 2015-08-17 ENCOUNTER — Ambulatory Visit
Admission: RE | Admit: 2015-08-17 | Discharge: 2015-08-17 | Disposition: A | Discharge status: Home / Self Care | Payer: Medicaid Other | Source: Ambulatory Visit | Attending: Radiation Oncology | Admitting: Radiation Oncology

## 2015-08-17 DIAGNOSIS — C569 Malignant neoplasm of unspecified ovary: Secondary | ICD-10-CM | POA: Diagnosis not present

## 2015-08-17 DIAGNOSIS — Z510 Encounter for antineoplastic radiation therapy: Secondary | ICD-10-CM | POA: Diagnosis present

## 2015-08-17 NOTE — Progress Notes (Signed)
  Radiation Oncology         (336) [phone] ________________________________  Name: [name] MRN: [id number]  Date: 08/17/2015  DOB: 1955/05/18  SIMULATION AND TREATMENT PLANNING NOTE    ICD-9-CM ICD-10-CM   1. International Federation of Gynecology and Obstetrics (FIGO) stage IVB epithelial ovarian cancer ([place]) 183.0 C56.9     DIAGNOSIS: Metastatic ovarian cancer  NARRATIVE:  The patient was brought to the [place].  Identity was confirmed.  All relevant records and images related to the planned course of therapy were reviewed.  The patient freely provided informed written consent to proceed with treatment after reviewing the details related to the planned course of therapy. The consent form was witnessed and verified by the simulation staff.  Then, the patient was set-up in a stable reproducible  supine position for radiation therapy.  CT images were obtained.  Surface markings were placed.  The CT images were loaded into the planning software.  Then the target and avoidance structures were contoured.  Treatment planning then occurred.  The radiation prescription was entered and confirmed.  Then, I designed and supervised the construction of a total of 5 medically necessary complex treatment devices.  I have requested : 3D Simulation  I have requested a DVH of the following structures: Heart lungs GTV, PTV,  spinal cord.  I have ordered:dose calc.  PLAN:  The patient will receive 37.5 Gy in 15 fractions directed at the left chest masses.  -----------------------------------  Blair Promise, PhD, MD  This document serves as a record of services personally performed by Gery Pray, MD. It was created on his behalf by Darcus Austin, a trained medical scribe. The creation of this record is based on the scribe's personal observations and the provider's statements to them. This document has been checked and approved by the attending provider.

## 2015-08-18 ENCOUNTER — Other Ambulatory Visit: Payer: Self-pay | Admitting: Oncology

## 2015-08-18 ENCOUNTER — Other Ambulatory Visit: Payer: Self-pay

## 2015-08-18 ENCOUNTER — Ambulatory Visit (HOSPITAL_BASED_OUTPATIENT_CLINIC_OR_DEPARTMENT_OTHER): Payer: Medicaid Other

## 2015-08-18 ENCOUNTER — Ambulatory Visit (HOSPITAL_BASED_OUTPATIENT_CLINIC_OR_DEPARTMENT_OTHER): Payer: Medicaid Other | Admitting: Oncology

## 2015-08-18 ENCOUNTER — Encounter: Payer: Self-pay | Admitting: Oncology

## 2015-08-18 ENCOUNTER — Other Ambulatory Visit (HOSPITAL_BASED_OUTPATIENT_CLINIC_OR_DEPARTMENT_OTHER): Payer: Medicaid Other

## 2015-08-18 VITALS — BP 153/73 | HR 86 | Temp 99.8°F | Resp 17 | Wt 161.7 lb

## 2015-08-18 DIAGNOSIS — E876 Hypokalemia: Secondary | ICD-10-CM

## 2015-08-18 DIAGNOSIS — C7989 Secondary malignant neoplasm of other specified sites: Secondary | ICD-10-CM

## 2015-08-18 DIAGNOSIS — C7951 Secondary malignant neoplasm of bone: Secondary | ICD-10-CM

## 2015-08-18 DIAGNOSIS — C801 Malignant (primary) neoplasm, unspecified: Secondary | ICD-10-CM

## 2015-08-18 DIAGNOSIS — G620 Drug-induced polyneuropathy: Secondary | ICD-10-CM | POA: Diagnosis not present

## 2015-08-18 DIAGNOSIS — C7802 Secondary malignant neoplasm of left lung: Secondary | ICD-10-CM

## 2015-08-18 DIAGNOSIS — D649 Anemia, unspecified: Secondary | ICD-10-CM

## 2015-08-18 DIAGNOSIS — C541 Malignant neoplasm of endometrium: Secondary | ICD-10-CM

## 2015-08-18 DIAGNOSIS — C569 Malignant neoplasm of unspecified ovary: Secondary | ICD-10-CM

## 2015-08-18 DIAGNOSIS — C778 Secondary and unspecified malignant neoplasm of lymph nodes of multiple regions: Secondary | ICD-10-CM

## 2015-08-18 DIAGNOSIS — I10 Essential (primary) hypertension: Secondary | ICD-10-CM

## 2015-08-18 DIAGNOSIS — C7800 Secondary malignant neoplasm of unspecified lung: Secondary | ICD-10-CM

## 2015-08-18 DIAGNOSIS — Z7901 Long term (current) use of anticoagulants: Secondary | ICD-10-CM

## 2015-08-18 DIAGNOSIS — Z8542 Personal history of malignant neoplasm of other parts of uterus: Secondary | ICD-10-CM | POA: Diagnosis not present

## 2015-08-18 DIAGNOSIS — C7801 Secondary malignant neoplasm of right lung: Secondary | ICD-10-CM | POA: Diagnosis not present

## 2015-08-18 DIAGNOSIS — Z8543 Personal history of malignant neoplasm of ovary: Secondary | ICD-10-CM | POA: Diagnosis not present

## 2015-08-18 DIAGNOSIS — C771 Secondary and unspecified malignant neoplasm of intrathoracic lymph nodes: Secondary | ICD-10-CM

## 2015-08-18 DIAGNOSIS — I82402 Acute embolism and thrombosis of unspecified deep veins of left lower extremity: Secondary | ICD-10-CM | POA: Diagnosis not present

## 2015-08-18 DIAGNOSIS — C772 Secondary and unspecified malignant neoplasm of intra-abdominal lymph nodes: Secondary | ICD-10-CM

## 2015-08-18 DIAGNOSIS — G893 Neoplasm related pain (acute) (chronic): Secondary | ICD-10-CM

## 2015-08-18 LAB — CBC WITH DIFFERENTIAL/PLATELET
BASO%: 0.2 % (ref 0.0–2.0)
Basophils Absolute: 0 10*3/uL (ref 0.0–0.1)
EOS%: 4 % (ref 0.0–7.0)
Eosinophils Absolute: 0.2 10*3/uL (ref 0.0–0.5)
HEMATOCRIT: 28.6 % — AB (ref 34.8–46.6)
HEMOGLOBIN: 9.2 g/dL — AB (ref 11.6–15.9)
LYMPH#: 0.7 10*3/uL — AB (ref 0.9–3.3)
LYMPH%: 14.3 % (ref 14.0–49.7)
MCH: 24.8 pg — ABNORMAL LOW (ref 25.1–34.0)
MCHC: 32.2 g/dL (ref 31.5–36.0)
MCV: 77.2 fL — ABNORMAL LOW (ref 79.5–101.0)
MONO#: 0.7 10*3/uL (ref 0.1–0.9)
MONO%: 13.8 % (ref 0.0–14.0)
NEUT%: 67.7 % (ref 38.4–76.8)
NEUTROS ABS: 3.3 10*3/uL (ref 1.5–6.5)
PLATELETS: 382 10*3/uL (ref 145–400)
RBC: 3.71 10*6/uL (ref 3.70–5.45)
RDW: 17.6 % — ABNORMAL HIGH (ref 11.2–14.5)
WBC: 4.8 10*3/uL (ref 3.9–10.3)

## 2015-08-18 LAB — COMPREHENSIVE METABOLIC PANEL
ALBUMIN: 2.6 g/dL — AB (ref 3.5–5.0)
ANION GAP: 11 meq/L (ref 3–11)
AST: 11 U/L (ref 5–34)
Alkaline Phosphatase: 105 U/L (ref 40–150)
BILIRUBIN TOTAL: 0.45 mg/dL (ref 0.20–1.20)
BUN: 12.9 mg/dL (ref 7.0–26.0)
CALCIUM: 10 mg/dL (ref 8.4–10.4)
CO2: 26 mEq/L (ref 22–29)
CREATININE: 0.8 mg/dL (ref 0.6–1.1)
Chloride: 102 mEq/L (ref 98–109)
EGFR: 87 mL/min/{1.73_m2} — ABNORMAL LOW (ref >90)
Glucose: 113 mg/dl (ref 70–140)
Potassium: 3.9 mEq/L (ref 3.5–5.1)
Sodium: 139 mEq/L (ref 136–145)
TOTAL PROTEIN: 7.5 g/dL (ref 6.4–8.3)

## 2015-08-18 MED ORDER — ZOLEDRONIC ACID 4 MG/100ML IV SOLN
4.0000 mg | Freq: Once | INTRAVENOUS | Status: AC
  Administered 2015-08-18: 4 mg via INTRAVENOUS
  Filled 2015-08-18: qty 100

## 2015-08-18 MED ORDER — HEPARIN SOD (PORK) LOCK FLUSH 100 UNIT/ML IV SOLN
500.0000 [IU] | Freq: Once | INTRAVENOUS | Status: AC
  Administered 2015-08-18: 500 [IU] via INTRAVENOUS
  Filled 2015-08-18: qty 5

## 2015-08-18 MED ORDER — HYDROMORPHONE HCL 2 MG PO TABS: 2.0000 mg | ORAL_TABLET | ORAL | 0 refills | Status: DC | PRN

## 2015-08-18 MED ORDER — SODIUM CHLORIDE 0.9 % IV SOLN
Freq: Once | INTRAVENOUS | Status: AC
  Administered 2015-08-18: 17:00:00 via INTRAVENOUS

## 2015-08-18 MED ORDER — SODIUM CHLORIDE 0.9% FLUSH
10.0000 mL | Freq: Once | INTRAVENOUS | Status: AC
  Administered 2015-08-18: 10 mL via INTRAVENOUS
  Filled 2015-08-18: qty 10

## 2015-08-18 MED ORDER — LETROZOLE 2.5 MG PO TABS: 2.5000 mg | ORAL_TABLET | Freq: Every day | ORAL | 0 refills | Status: DC

## 2015-08-18 NOTE — Patient Instructions (Signed)
[facility] Discharge Instructions for Patients Receiving Chemotherapy  Today you received the following chemotherapy agents Zometa.  To help prevent nausea and vomiting after your treatment, we encourage you to take your nausea medication as directed.  If you develop nausea and vomiting that is not controlled by your nausea medication, call the clinic.   BELOW ARE SYMPTOMS THAT SHOULD BE REPORTED IMMEDIATELY:  *FEVER GREATER THAN 100.5 F  *CHILLS WITH OR WITHOUT FEVER  NAUSEA AND VOMITING THAT IS NOT CONTROLLED WITH YOUR NAUSEA MEDICATION  *UNUSUAL SHORTNESS OF BREATH  *UNUSUAL BRUISING OR BLEEDING  TENDERNESS IN MOUTH AND THROAT WITH OR WITHOUT PRESENCE OF ULCERS  *URINARY PROBLEMS  *BOWEL PROBLEMS  UNUSUAL RASH Items with * indicate a potential emergency and should be followed up as soon as possible.  Feel free to call the clinic you have any questions or concerns. The clinic phone number is [phone].  Please show the CHEMO ALERT CARD at check-in to the Emergency Department and triage nurse.    

## 2015-08-18 NOTE — Progress Notes (Signed)
OFFICE PROGRESS NOTE   August 18, 2015   Physicians: E.Rossi, J.Kinard, C.Windham, P.Lucianne Lei Trigt Verl Blalock, [facility]) New patient referral made to [facility] and Wellness for PCP, not established yet.    INTERVAL HISTORY:  Patient is seen, together with daughter and another family member, in continuing attention to progressive metastatic gyn carcinoma involving chest, lungs, thoracic/ retroperitoneal/ and pelvic nodes. She had first gemzar on 08-11-15, which she tolerated without problems. Dr Sondra Come plans palliative radiation to chest, 8-21 thru 09-14-15 per EMR scheduling. As gemzar will not be used during radiation, she will begin letrozole now, to use at least thru radiation.   For zometa today. Calcium 10, alb 2.6. Previous L4-5 involvement not seen specifically on most recent PET  Patient had no significant nausea and no fever or chills with first gemzar. Primary problem now is 2 weeks of left chest pain, which she localizes to all of mid left chest and thru to back. Tessalon perles have helped cough, cannot take deep breaths and is SOB with activity (O2 sat 100% today). She avoids using  MSIR due to increased itching with this medication recently. She is eating and drinking some, bowels ok, no bleeding, no LE swelling. New PAC functioned well for chemo.    PAC placed by IR on 08-10-15 On Eliquis for LLE DVT  Genetics testing negative Breast Ovarian and gyn panel by [facility] 06-14-15.  CA 125 on 02-14-15 642.9 ER + on cytology 02-2015 Feraheme 02-16-15 and [id number].   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 She was lost to follow up for the gyn  cancer after 11-2010, until presented with recurrent disease. Patient reported right inguinal mass for ~ 2 months, then LLE pain for ~ 3 weeks when she presented to ED on 02-12-15. CT AP showed nodules in lung bases suspicious for metastatic disease, liver not remarkable, no hydronephrosis, 6 mm right renal calculus, thrombus left external iliac vein into left common femoral vein, retroperitoneal and pelvic adenopathy, an 8.5 x 7.1 cm necrotic right inguinal node, post hyst ooph, no ascites, likely tumor involvement in left psoas with adjacent involvement of L4 and L5 vertebral bodies; CT chest subsequently had multiple bilateral pulmonary nodules and central adenopathy. Patient was begun on heparin qtt which was transitioned to Eliquis (on pharmaceutical assistance program). CA 125 from 02-14-15 was 642.9, this having been 3.6 in 08-2010. Pathology from right inguinal nodal mass aspiration and core needle biopsy 02-15-15 necrotic material with metastatic adenocarcinoma with immunostains consistent with gyn primary ([id number], ER+). She was transfused 2 units PRBCs for hgb 6.7. Iron studies 2-12 with serum iron 14 and %sat 7, given feraheme x2. Dr Denman George saw in consultation prior to starting carboplatin taxotere on 02-24-15. She reacted to [place] skin test prior to cycle 3, treatment continued with taxotere only thru cycle 5 on 05-26-15, tolerated neulasta poorly. She had I&D and capsulectomy of seroma cavity right inguinal region on 05-12-15. CT CAP 06-14-15 significant improvement in all areas of metastatic disease with exception of enlarging cystic mass in left hilum. Echocardiogram 07-19-15 done to evaluate central chest mass: EF 60-65% and minimal pericardial effusion. PET 08-01-15 showed significant progression of multiple areas of disease in addition to the chest involvement. She had  first gemzar 08-11-15, then began letrozole on 08-18-15, to be used with upcoming chest radiation.    Objective:  Vital signs in  last 24 hours:  BP (!) 153/73 (BP Location: Right Arm, Patient Position: Sitting)   Pulse 86   Temp 99.8 F (37.7 C) (Oral)   Resp 17   Wt 161 lb 11.2 oz (73.3 kg)   SpO2 100%   BMI 29.58 kg/m   Alert, oriented and appropriate. In [facility], looks in moderate discomfort, leaning to left, tho very stoic as always.  Respirations not labored RA, no cough during visit. Hair growing back.  HEENT:PERRL, sclerae not icteric. Oral mucosa moist without lesions,  . No JVD.  Lymphatics:no cervical,supraclavicular adenopathy Resp: diminished BS thru left chest, no wheezes, rales or rubs. No use of accessory muscles Cardio: regular rate and rhythm. No gallop. GI: abdomen soft, nontender, not distended, no mass or organomegaly. Normally active bowel sounds.  Musculoskeletal/ Extremities: LE/ UE without pitting edema, cords, tenderness Neuro: no peripheral neuropathy. Otherwise nonfocal. PSYCH mood and affect baseline Skin without ecchymosis, petechiae. Slight erythema left anterior chest onto superior left breast without vesicles, patient reports scratching there. No other rash. Skin dry on legs.  Portacath right anterior chest-without erythema or tenderness  Lab Results:  Results for orders placed or performed in visit on 08/18/15  CBC with Differential  Result Value Ref Range   WBC 4.8 3.9 - 10.3 10e3/uL   NEUT# 3.3 1.5 - 6.5 10e3/uL   HGB 9.2 (L) 11.6 - 15.9 g/dL   HCT 28.6 (L) 34.8 - 46.6 %   Platelets 382 145 - 400 10e3/uL   MCV 77.2 (L) 79.5 - 101.0 fL   MCH 24.8 (L) 25.1 - 34.0 pg   MCHC 32.2 31.5 - 36.0 g/dL   RBC 3.71 3.70 - 5.45 10e6/uL   RDW 17.6 (H) 11.2 - 14.5 %   lymph# 0.7 (L) 0.9 - 3.3 10e3/uL   MONO# 0.7 0.1 - 0.9 10e3/uL   Eosinophils Absolute 0.2 0.0 - 0.5 10e3/uL   Basophils Absolute 0.0 0.0 - 0.1 10e3/uL   NEUT% 67.7 38.4 - 76.8 %   LYMPH% 14.3 14.0 - 49.7 %   MONO% 13.8 0.0 - 14.0 %   EOS% 4.0 0.0 - 7.0 %   BASO% 0.2 0.0 - 2.0 %  Comprehensive metabolic panel   Result Value Ref Range   Sodium 139 136 - 145 mEq/L   Potassium 3.9 3.5 - 5.1 mEq/L   Chloride 102 98 - 109 mEq/L   CO2 26 22 - 29 mEq/L   Glucose 113 70 - 140 mg/dl   BUN 12.9 7.0 - 26.0 mg/dL   Creatinine 0.8 0.6 - 1.1 mg/dL   Total Bilirubin 0.45 0.20 - 1.20 mg/dL   Alkaline Phosphatase 105 40 - 150 U/L   AST 11 5 - 34 U/L   ALT <9 0 - 55 U/L   Total Protein 7.5 6.4 - 8.3 g/dL   Albumin 2.6 (L) 3.5 - 5.0 g/dL   Calcium 10.0 8.4 - 10.4 mg/dL   Anion Gap 11 3 - 11 mEq/L   EGFR 87 (L) >90 ml/min/1.73 m2    Corrected calcium slightly elevated today, zometa as planned today.  Studies/Results:  No results found.  PAC placement information reviewed by MD  Medications: I have reviewed the patient's current medications. Stop MSIR due to itching  DISCUSSION More symptomatic from extensive progression in chest since I saw her 2 weeks ago. Appreciate Dr Sondra Come planning palliative  radiation, to begin next week. Not appropriate to continue gemzar with extent of radiation to chest, tho fortunately she was able to begin cycle 1 gemzar already.  I am concerned about waiting another 4 weeks to continue systemic treatment; as path from this recurrent disease was ER +, I have suggested starting letrozole now, as we may get some coverage from this, tho certainly hormone blockers are generally not as rapid to show effect as chemo.  We have discussed mechanism of action of hormone blockers and possible side effects of letrozole in short term. Patient is in agreement with this plan, fine to begin letrozole as soon as obtained.  Treatment is in palliative attempt.   Needs better pain management for chest symptoms. Will try dilaudid 2- 4 mg every 4 hrs prn and they will let me know if any problems with this; no intolerance to dilaudid listed in this EMR. Contnue tessalon as needed.   Rash left chest likely from morphine related pruritis, but they have been instructed to watch and let us know if progresses  or becomes vesicular.  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 Clinically and by CT CAP 06-14-15 much improved other than large hilar mass, also not tolerating taxotere/ neulasta due to severe aches. Now extensive progression in multiple areas in addition to chest, first gemzar given 08-11-15. With palliative radiation planned to chest now, will hold gemzar and have started letrozole in attempt to get some additional systemic coverage while radiation in progress. I will see her in follow up during radiation.  Post I&D with capsulectomy of seroma cavity 05-12-15, healed.  adenocarcinoma in that path. 2.inadequate pain control: not tolerating MSIR due to itching. Try dilaudid now. 3.LLE DVT documented 02-12-15: on Eliquis, no longer symptomatic. No IVC filter. Eliquis approved thru [employer] assistance x 1 year. Hold x 48 hrs prior to [facility] placement 4. PRBCs given 08-08-15 for symptomatic anemia. She has had feraheme x 2 in Feb/March 2017 5. chemo nausea and vomiting improved with aloxi and protonix with prior regimen. PO intake decreased, appetite decreased with progression of disease now. Encouraged supplements.  6.uncontrolled HTN initially: on labetalol 200 mg tid per urgent care + clonidine 0.1 mg tid and HCTZ. BP much better now. She has basically refused to get PCP. 7.carbo skin test reaction cycle 3.  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 Stable or slightly progressive, feet > hands with additional taxane recently. 10.Social: Medicaid approved 11. PAC by IR 08-10-15 12.overdue mammograms, general medical care, colonoscopy.  Noncompliance with follow up after cancer diagnoses and adjuvant treatment after ~ 2012. Noncompliant with care for HTN over past several years, despite family efforts ( daughter is pediatrician) Needs PCP but has not followed thru with referral to [facility] and Wellness clinic. 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 Repeat colonoscopy recommended 10-2009, apparently not done. Note iron deficiency. FOB not yet done, see above. 14.hypokalemia: needs to continue supplement, not sure if she is compliant, in EMR 20 mEq daily 15.corrected calcium slightly elevated again today.  IV bisphosphonate as zometa 08-18-15   All questiona answered. Zometa orders confirmed. Time spent 30 min including >50% counseling and coordination of care.    Evlyn Clines, MD  08/18/2015, 7:38 PM

## 2015-08-19 LAB — CA 125: CANCER ANTIGEN (CA) 125: 171 U/mL — AB (ref 0.0–38.1)

## 2015-08-20 DIAGNOSIS — C772 Secondary and unspecified malignant neoplasm of intra-abdominal lymph nodes: Secondary | ICD-10-CM | POA: Insufficient documentation

## 2015-08-20 DIAGNOSIS — C771 Secondary and unspecified malignant neoplasm of intrathoracic lymph nodes: Secondary | ICD-10-CM | POA: Insufficient documentation

## 2015-08-20 DIAGNOSIS — C7800 Secondary malignant neoplasm of unspecified lung: Secondary | ICD-10-CM | POA: Insufficient documentation

## 2015-08-22 ENCOUNTER — Ambulatory Visit: Payer: Medicaid Other

## 2015-08-22 ENCOUNTER — Telehealth: Payer: Self-pay

## 2015-08-22 DIAGNOSIS — C569 Malignant neoplasm of unspecified ovary: Secondary | ICD-10-CM

## 2015-08-22 MED ORDER — BENZONATATE 100 MG PO CAPS: 100.0000 mg | ORAL_CAPSULE | Freq: Three times a day (TID) | ORAL | 0 refills | Status: DC | PRN

## 2015-08-22 NOTE — Telephone Encounter (Signed)
-----   Message from Gordy Levan, MD sent at 08/20/2015 10:44 AM EDT ----- Please refill tessalon for #90    1 every 8 hrs prn cough

## 2015-08-22 NOTE — Telephone Encounter (Signed)
lvm tessalon was refilled

## 2015-08-23 ENCOUNTER — Ambulatory Visit: Payer: Medicaid Other

## 2015-08-23 DIAGNOSIS — Z510 Encounter for antineoplastic radiation therapy: Secondary | ICD-10-CM | POA: Diagnosis not present

## 2015-08-24 ENCOUNTER — Other Ambulatory Visit: Payer: Self-pay | Admitting: Oncology

## 2015-08-24 ENCOUNTER — Ambulatory Visit
Admission: RE | Admit: 2015-08-24 | Discharge: 2015-08-24 | Disposition: A | Discharge status: Home / Self Care | Payer: Medicaid Other | Source: Ambulatory Visit | Attending: Radiation Oncology | Admitting: Radiation Oncology

## 2015-08-24 ENCOUNTER — Ambulatory Visit: Payer: Medicaid Other

## 2015-08-24 DIAGNOSIS — C7802 Secondary malignant neoplasm of left lung: Secondary | ICD-10-CM

## 2015-08-24 DIAGNOSIS — Z510 Encounter for antineoplastic radiation therapy: Secondary | ICD-10-CM | POA: Diagnosis not present

## 2015-08-24 NOTE — Progress Notes (Signed)
  Radiation Oncology         [phone] ________________________________  Name: [name] MRN: [id number]  Date: 08/24/2015  DOB: 1955/11/05  Simulation Verification Note    ICD-9-CM ICD-10-CM   1. 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 The clinical setup was verified. Then port films were obtained and uploaded to the radiation oncology medical record software.  The treatment beams were carefully compared against the planned radiation fields. The position location and shape of the radiation fields was reviewed. They targeted volume of tissue appears to be appropriately covered by the radiation beams. Organs at risk appear to be excluded as planned.  Based on my personal review, I approved the simulation verification. The patient's treatment will proceed as planned.  -----------------------------------  Blair Promise, PhD, MD

## 2015-08-25 ENCOUNTER — Ambulatory Visit
Admission: RE | Admit: 2015-08-25 | Discharge: 2015-08-25 | Disposition: A | Discharge status: Home / Self Care | Payer: Medicaid Other | Source: Ambulatory Visit | Attending: Radiation Oncology | Admitting: Radiation Oncology

## 2015-08-25 ENCOUNTER — Other Ambulatory Visit: Payer: Self-pay

## 2015-08-25 ENCOUNTER — Other Ambulatory Visit: Payer: Medicaid Other

## 2015-08-25 ENCOUNTER — Ambulatory Visit: Payer: Medicaid Other

## 2015-08-25 DIAGNOSIS — I10 Essential (primary) hypertension: Secondary | ICD-10-CM

## 2015-08-25 DIAGNOSIS — Z510 Encounter for antineoplastic radiation therapy: Secondary | ICD-10-CM | POA: Diagnosis not present

## 2015-08-25 MED ORDER — LABETALOL HCL 100 MG PO TABS: 200.0000 mg | ORAL_TABLET | Freq: Three times a day (TID) | ORAL | 2 refills | Status: DC

## 2015-08-26 ENCOUNTER — Ambulatory Visit
Admission: RE | Admit: 2015-08-26 | Discharge: 2015-08-26 | Disposition: A | Discharge status: Home / Self Care | Payer: Medicaid Other | Source: Ambulatory Visit | Attending: Radiation Oncology | Admitting: Radiation Oncology

## 2015-08-26 DIAGNOSIS — Z510 Encounter for antineoplastic radiation therapy: Secondary | ICD-10-CM | POA: Diagnosis not present

## 2015-08-29 ENCOUNTER — Ambulatory Visit
Admission: RE | Admit: 2015-08-29 | Discharge: 2015-08-29 | Disposition: A | Discharge status: Home / Self Care | Payer: Medicaid Other | Source: Ambulatory Visit | Attending: Radiation Oncology | Admitting: Radiation Oncology

## 2015-08-29 DIAGNOSIS — Z510 Encounter for antineoplastic radiation therapy: Secondary | ICD-10-CM | POA: Diagnosis not present

## 2015-08-30 ENCOUNTER — Ambulatory Visit
Admission: RE | Admit: 2015-08-30 | Discharge: 2015-08-30 | Disposition: A | Discharge status: Home / Self Care | Payer: Medicaid Other | Source: Ambulatory Visit | Attending: Radiation Oncology | Admitting: Radiation Oncology

## 2015-08-30 ENCOUNTER — Other Ambulatory Visit: Payer: Self-pay

## 2015-08-30 ENCOUNTER — Encounter: Payer: Self-pay | Admitting: Radiation Oncology

## 2015-08-30 ENCOUNTER — Telehealth: Payer: Self-pay

## 2015-08-30 ENCOUNTER — Ambulatory Visit: Payer: Medicaid Other

## 2015-08-30 VITALS — BP 154/79 | HR 101 | Temp 98.7°F | Ht 62.0 in | Wt 159.4 lb

## 2015-08-30 DIAGNOSIS — C541 Malignant neoplasm of endometrium: Secondary | ICD-10-CM

## 2015-08-30 DIAGNOSIS — Z510 Encounter for antineoplastic radiation therapy: Secondary | ICD-10-CM | POA: Diagnosis not present

## 2015-08-30 DIAGNOSIS — C771 Secondary and unspecified malignant neoplasm of intrathoracic lymph nodes: Secondary | ICD-10-CM

## 2015-08-30 DIAGNOSIS — C7802 Secondary malignant neoplasm of left lung: Secondary | ICD-10-CM

## 2015-08-30 MED ORDER — SONAFINE EX EMUL
1.0000 | Freq: Once | CUTANEOUS | Status: AC
Start: 2015-08-30 — End: 2015-08-30
  Administered 2015-08-30: 1 via TOPICAL

## 2015-08-30 NOTE — Progress Notes (Signed)
  Radiation Oncology         [phone] ________________________________  Name: [name] MRN: [id number]  Date: 08/30/2015  DOB: [birth year]  Weekly Radiation Therapy Management    ICD-9-CM ICD-10-CM   1. 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 . . . . . . . The patient presents for routine under treatment assessment.                                   The patient is without complaint.                                 Set-up films were reviewed.                                 The chart was checked. has completed 5 fractions to her left lung.  She denies having pain today but has some in upper back.  She taking Morphine as needed.  She reports she is not coughing as much.  She reports having occasional shortness of breath.  She reports having a poor appetite and energy level. Patient reports that she has been having some nausea and vomiting.  The patient reports that she has been unable to eat, and because of this she will receive IV fluids at Dr. Mariana Kaufman office tomorrow.   Physical Findings. . .  height is 5\' 2"  (1.575 m) and weight is 159 lb 6.4 oz (72.3 kg). Her oral temperature is 98.7 F (37.1 C). Her blood pressure is 154/79 (abnormal) and her pulse is 101 (abnormal). Her oxygen saturation is 100%. . Weight essentially stable.  No significant changes.  Lungs are clear to auscultation bilaterally. Heart has regular rate and rhythm. No palpable cervical, supraclavicular, or axillary adenopathy. Abdomen soft, non-tender, normal bowel sounds.  Impression . . . . . . . The patient is tolerating radiation. Plan . . . . . . . . . . . . Continue treatment as planned.  ________________________________   Blair Promise, PhD, MD This document serves as a record of services personally performed by Gery Pray, MD. It was created on his behalf by Truddie Hidden, a trained medical scribe. The creation of this record is based on the scribe's personal observations and the provider's statements to them. This document has been checked and approved by the attending provider.

## 2015-08-30 NOTE — Telephone Encounter (Signed)
Lisa. [name] has been vomiting even with out coughing ~1-2 times daily. She is not taking in fluids very well.  She is taking in ~ 1 ensure a day. She would like to come in for IVF around her radiation appointment at 1445. She is not  Able to come to the [facility] today for IVF due to transportation issues.  She can come to [place] at 1200 for fluids prior to radiation. She is scheduled at the [facility] at 1200 as there is not availability at [facility]. [name] verbalized understanding.

## 2015-08-30 NOTE — Progress Notes (Signed)
Pt here for patient teaching.  Pt given Radiation and You booklet and Sonafine. Pt reports they have not watched the Radiation Therapy Education video and has been given the link to watch at home.  Reviewed areas of pertinence such as fatigue, skin changes and throat changes . Pt able to give teach back of to pat skin and use unscented/gentle soap,apply Sonafine bid and avoid applying anything to skin within 4 hours of treatment. Pt demonstrated understanding and verbalizes understanding of information given and will contact nursing with any questions or concerns.

## 2015-08-30 NOTE — Progress Notes (Signed)
[name] has completed 5 fractions to her left lung.  She denies having pain today but has been upper back.  She taking Morphine as needed.  She reports she is not coughing as much.  She reports having occasional shortness of breath.  She reports having a poor appetite and energy level.    BP (!) 154/79 (BP Location: Left Arm, Patient Position: Sitting)   Pulse (!) 101   Temp 98.7 F (37.1 C) (Oral)   Ht 5\' 2"  (1.575 m)   Wt 159 lb 6.4 oz (72.3 kg)   SpO2 100%   BMI 29.15 kg/m    Wt Readings from Last 3 Encounters:  08/30/15 159 lb 6.4 oz (72.3 kg)  08/18/15 161 lb 11.2 oz (73.3 kg)  08/15/15 162 lb 14.4 oz (73.9 kg)

## 2015-08-31 ENCOUNTER — Ambulatory Visit (HOSPITAL_COMMUNITY)
Admission: RE | Admit: 2015-08-31 | Discharge: 2015-08-31 | Disposition: A | Discharge status: Home / Self Care | Payer: Medicaid Other | Source: Ambulatory Visit | Attending: Oncology | Admitting: Oncology

## 2015-08-31 ENCOUNTER — Ambulatory Visit
Admission: RE | Admit: 2015-08-31 | Discharge: 2015-08-31 | Disposition: A | Discharge status: Home / Self Care | Payer: Medicaid Other | Source: Ambulatory Visit | Attending: Radiation Oncology | Admitting: Radiation Oncology

## 2015-08-31 DIAGNOSIS — Z510 Encounter for antineoplastic radiation therapy: Secondary | ICD-10-CM | POA: Diagnosis not present

## 2015-08-31 DIAGNOSIS — C541 Malignant neoplasm of endometrium: Secondary | ICD-10-CM

## 2015-08-31 DIAGNOSIS — D649 Anemia, unspecified: Secondary | ICD-10-CM | POA: Diagnosis not present

## 2015-08-31 MED ORDER — SODIUM CHLORIDE 0.9% FLUSH
10.0000 mL | INTRAVENOUS | Status: AC | PRN
  Administered 2015-08-31: 10 mL

## 2015-08-31 MED ORDER — SODIUM CHLORIDE 0.9 % IV SOLN
Freq: Once | INTRAVENOUS | Status: AC
  Administered 2015-08-31: 12:00:00 via INTRAVENOUS

## 2015-08-31 MED ORDER — SODIUM CHLORIDE 0.9 % IV SOLN
Freq: Once | INTRAVENOUS | Status: AC
  Administered 2015-08-31: 13:00:00 via INTRAVENOUS
  Filled 2015-08-31: qty 4

## 2015-08-31 MED ORDER — HEPARIN SOD (PORK) LOCK FLUSH 100 UNIT/ML IV SOLN
500.0000 [IU] | INTRAVENOUS | Status: AC | PRN
  Administered 2015-08-31: 500 [IU]
  Filled 2015-08-31: qty 5

## 2015-08-31 NOTE — Discharge Instructions (Signed)
Pt received 1000cc normal saline and a bolus of Zofran today.

## 2015-08-31 NOTE — Progress Notes (Signed)
Patient ID: [name], female   DOB: January 05, 1955, 60 y.o.   MRN: [id number] Arriel Victor: Evlyn Clines MD  Associated Diagnosis: Endometrial Cancer  Procedure: 1 liter 0.9% Normal Saline infusion via port a cath as ordered.  Patient tolerated infusion well. Went over discharge instructions and copy given to patient. Port flushed per protocol and de accessed. Alert, oriented and ambulatory at time of discharge. Discharged to home.

## 2015-09-01 ENCOUNTER — Ambulatory Visit
Admission: RE | Admit: 2015-09-01 | Discharge: 2015-09-01 | Disposition: A | Discharge status: Home / Self Care | Payer: Medicaid Other | Source: Ambulatory Visit | Attending: Radiation Oncology | Admitting: Radiation Oncology

## 2015-09-01 DIAGNOSIS — Z510 Encounter for antineoplastic radiation therapy: Secondary | ICD-10-CM | POA: Diagnosis not present

## 2015-09-02 ENCOUNTER — Ambulatory Visit
Admission: RE | Admit: 2015-09-02 | Discharge: 2015-09-02 | Disposition: A | Discharge status: Home / Self Care | Payer: Medicaid Other | Source: Ambulatory Visit | Attending: Radiation Oncology | Admitting: Radiation Oncology

## 2015-09-02 ENCOUNTER — Other Ambulatory Visit: Payer: Self-pay

## 2015-09-02 DIAGNOSIS — C541 Malignant neoplasm of endometrium: Secondary | ICD-10-CM

## 2015-09-02 DIAGNOSIS — Z510 Encounter for antineoplastic radiation therapy: Secondary | ICD-10-CM | POA: Diagnosis not present

## 2015-09-02 MED ORDER — ZOLPIDEM TARTRATE 5 MG PO TABS: 5.0000 mg | ORAL_TABLET | Freq: Every evening | ORAL | 2 refills | Status: DC | PRN

## 2015-09-05 ENCOUNTER — Other Ambulatory Visit: Payer: Self-pay | Admitting: Oncology

## 2015-09-06 ENCOUNTER — Ambulatory Visit (HOSPITAL_BASED_OUTPATIENT_CLINIC_OR_DEPARTMENT_OTHER): Payer: Medicaid Other | Admitting: Nurse Practitioner

## 2015-09-06 ENCOUNTER — Encounter: Payer: Self-pay | Admitting: Oncology

## 2015-09-06 ENCOUNTER — Ambulatory Visit
Admission: RE | Admit: 2015-09-06 | Discharge: 2015-09-06 | Disposition: A | Discharge status: Home / Self Care | Payer: Medicaid Other | Source: Ambulatory Visit | Attending: Radiation Oncology | Admitting: Radiation Oncology

## 2015-09-06 ENCOUNTER — Other Ambulatory Visit (HOSPITAL_BASED_OUTPATIENT_CLINIC_OR_DEPARTMENT_OTHER): Payer: Medicaid Other

## 2015-09-06 ENCOUNTER — Encounter: Payer: Self-pay | Admitting: Radiation Oncology

## 2015-09-06 ENCOUNTER — Ambulatory Visit (HOSPITAL_BASED_OUTPATIENT_CLINIC_OR_DEPARTMENT_OTHER): Payer: Medicaid Other | Admitting: Oncology

## 2015-09-06 VITALS — BP 171/86 | HR 90 | Temp 98.9°F | Resp 18 | Ht 62.0 in | Wt 157.3 lb

## 2015-09-06 VITALS — BP 169/98 | HR 94 | Temp 99.8°F | Ht 62.0 in | Wt 157.3 lb

## 2015-09-06 DIAGNOSIS — I10 Essential (primary) hypertension: Secondary | ICD-10-CM

## 2015-09-06 DIAGNOSIS — I82402 Acute embolism and thrombosis of unspecified deep veins of left lower extremity: Secondary | ICD-10-CM

## 2015-09-06 DIAGNOSIS — L299 Pruritus, unspecified: Secondary | ICD-10-CM

## 2015-09-06 DIAGNOSIS — G620 Drug-induced polyneuropathy: Secondary | ICD-10-CM | POA: Diagnosis not present

## 2015-09-06 DIAGNOSIS — C541 Malignant neoplasm of endometrium: Secondary | ICD-10-CM

## 2015-09-06 DIAGNOSIS — G893 Neoplasm related pain (acute) (chronic): Secondary | ICD-10-CM

## 2015-09-06 DIAGNOSIS — C801 Malignant (primary) neoplasm, unspecified: Secondary | ICD-10-CM

## 2015-09-06 DIAGNOSIS — Z66 Do not resuscitate: Secondary | ICD-10-CM | POA: Insufficient documentation

## 2015-09-06 DIAGNOSIS — C569 Malignant neoplasm of unspecified ovary: Secondary | ICD-10-CM

## 2015-09-06 DIAGNOSIS — C7801 Secondary malignant neoplasm of right lung: Secondary | ICD-10-CM | POA: Diagnosis not present

## 2015-09-06 DIAGNOSIS — C7800 Secondary malignant neoplasm of unspecified lung: Secondary | ICD-10-CM

## 2015-09-06 DIAGNOSIS — Z510 Encounter for antineoplastic radiation therapy: Secondary | ICD-10-CM | POA: Diagnosis not present

## 2015-09-06 DIAGNOSIS — E639 Nutritional deficiency, unspecified: Secondary | ICD-10-CM

## 2015-09-06 DIAGNOSIS — C7802 Secondary malignant neoplasm of left lung: Secondary | ICD-10-CM

## 2015-09-06 DIAGNOSIS — E611 Iron deficiency: Secondary | ICD-10-CM | POA: Diagnosis not present

## 2015-09-06 DIAGNOSIS — Z7901 Long term (current) use of anticoagulants: Secondary | ICD-10-CM

## 2015-09-06 LAB — CBC WITH DIFFERENTIAL/PLATELET
BASO%: 0.7 % (ref 0.0–2.0)
Basophils Absolute: 0 10*3/uL (ref 0.0–0.1)
EOS%: 2.2 % (ref 0.0–7.0)
Eosinophils Absolute: 0.1 10*3/uL (ref 0.0–0.5)
HCT: 25.7 % — ABNORMAL LOW (ref 34.8–46.6)
HGB: 8.4 g/dL — ABNORMAL LOW (ref 11.6–15.9)
LYMPH%: 4.4 % — AB (ref 14.0–49.7)
MCH: 24.6 pg — AB (ref 25.1–34.0)
MCHC: 32.7 g/dL (ref 31.5–36.0)
MCV: 75.5 fL — AB (ref 79.5–101.0)
MONO#: 0.8 10*3/uL (ref 0.1–0.9)
MONO%: 12.5 % (ref 0.0–14.0)
NEUT#: 5 10*3/uL (ref 1.5–6.5)
NEUT%: 80.2 % — AB (ref 38.4–76.8)
PLATELETS: 482 10*3/uL — AB (ref 145–400)
RBC: 3.4 10*6/uL — AB (ref 3.70–5.45)
RDW: 18.3 % — ABNORMAL HIGH (ref 11.2–14.5)
WBC: 6.2 10*3/uL (ref 3.9–10.3)
lymph#: 0.3 10*3/uL — ABNORMAL LOW (ref 0.9–3.3)

## 2015-09-06 LAB — COMPREHENSIVE METABOLIC PANEL
ANION GAP: 12 meq/L — AB (ref 3–11)
AST: 8 U/L (ref 5–34)
Albumin: 2.6 g/dL — ABNORMAL LOW (ref 3.5–5.0)
Alkaline Phosphatase: 92 U/L (ref 40–150)
BUN: 6.2 mg/dL — ABNORMAL LOW (ref 7.0–26.0)
CHLORIDE: 102 meq/L (ref 98–109)
CO2: 23 meq/L (ref 22–29)
Calcium: 9.1 mg/dL (ref 8.4–10.4)
Creatinine: 0.7 mg/dL (ref 0.6–1.1)
Glucose: 102 mg/dl (ref 70–140)
POTASSIUM: 3.6 meq/L (ref 3.5–5.1)
Sodium: 137 mEq/L (ref 136–145)
Total Bilirubin: 0.55 mg/dL (ref 0.20–1.20)
Total Protein: 7.3 g/dL (ref 6.4–8.3)

## 2015-09-06 MED ORDER — SODIUM CHLORIDE 0.9% FLUSH
10.0000 mL | Freq: Once | INTRAVENOUS | Status: AC
  Administered 2015-09-06: 10 mL via INTRAVENOUS
  Filled 2015-09-06: qty 10

## 2015-09-06 MED ORDER — LORAZEPAM 0.5 MG PO TABS: ORAL_TABLET | ORAL | 0 refills | Status: DC

## 2015-09-06 MED ORDER — SODIUM CHLORIDE 0.9 % IV SOLN: INTRAVENOUS | Status: DC

## 2015-09-06 MED ORDER — HEPARIN SOD (PORK) LOCK FLUSH 100 UNIT/ML IV SOLN
500.0000 [IU] | Freq: Once | INTRAVENOUS | Status: AC
  Administered 2015-09-06: 500 [IU] via INTRAVENOUS
  Filled 2015-09-06: qty 5

## 2015-09-06 NOTE — Patient Instructions (Signed)

## 2015-09-06 NOTE — Progress Notes (Signed)
  Radiation Oncology         [phone] ________________________________  Name: [name] MRN: [id number]  Date: 09/06/2015  DOB: 1955/09/12  Weekly Radiation Therapy Management    ICD-9-CM ICD-10-CM   1. Malignant neoplasm metastatic to left lung (HCC) 197.0 C78.02   2. Endometrial cancer (HCC) 182.0 C54.1 LORazepam (ATIVAN) 0.5 MG tablet  3. DVT (deep venous thrombosis), left 453.40 I82.402 LORazepam (ATIVAN) 0.5 MG tablet       Metastatic ovarian cancer   Current Dose: 22.5 Gy     Planned Dose:  37.5 Gy  Narrative . . . . . . . . The patient presents for routine under treatment assessment.                                   [name] presents for her 9th fraction of radiation to her Left Lung. She denies pain with swallowing. She will take morphine 15 mg once daily (or less frequently) for back pain. She reports nausea and vomiting about twice daily and that it has increased since beginning radiation. She has been taking ativan twice daily with some relief. She does need a refill for this medicine that was prescribed by Dr. Marko Plume. She reports fatigue. She has shortness of breath even while resting. She has a non-productive cough that will sometimes bring on vomiting. She is using sonafine twice daily. She has a decreased appetite and is not eating well.                                  Set-up films were reviewed.                                 The chart was checked. Physical Findings. . .  height is 5\' 2"  (1.575 m) and weight is 157 lb 4.8 oz (71.4 kg). Her temperature is 99.8 F (37.7 C). Her blood pressure is 169/98 (abnormal) and her pulse is 94. Her oxygen saturation is 99%.  The patient is ambulatory with a wheelchair. Lungs are clear to auscultation bilaterally. Heart has regular rate and rhythm. The patient has lost 8 lb in the last month. Impression . . . . . . . The patient is tolerating radiation. Plan . . . . . . . . . . . . Continue treatment as planned. I will  refill her Ativan. ________________________________   Blair Promise, PhD, MD  This document serves as a record of services personally performed by Gery Pray, MD. It was created on his behalf by Darcus Austin, a trained medical scribe. The creation of this record is based on the scribe's personal observations and the provider's statements to them. This document has been checked and approved by the attending provider.

## 2015-09-06 NOTE — Progress Notes (Signed)
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 She continues palliative radiation to chest by Dr Sondra Come, planned thru 09-14-15. She had first gemzar on 08-11-15, zometa on 08-18-15 and has been on letrozole since 08-18-15.   She had IVF on 08-31-15, given due to N/V and poor po intake, seemed helpful.   Patient reports some improvement in breathing and in coughing with radiation in process and with tessalon every 8 hrs. Cough is not productive, no specific wheezing. No fever. Appetite is poor, tends to vomit when coughs and some nausea at other times, prn antiemetics somewhat helpful. She has been drinking ~ 1 supplement daily. Bowels are moving. Most pain is left chest and upper back, again using morphine, which causes itching, but is more tolerable than nausea that she experienced with trial on dilaudid. Ambien is helping somewhat with sleep. She is weak, mostly in bed.  Remainder of 10 point Review of Systems negative   PAC placed by IR on 08-10-15 On Eliquis for LLE DVT  Genetics testing negative Breast Ovarian and gyn panel by [facility] 06-14-15.  CA 125 on 02-14-15 642.9 ER + on cytology 02-2015 Feraheme 02-16-15 and [id number].    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 She was lost to follow up for the gyn  cancer after 11-2010, until presented with recurrent disease. Patient reported right inguinal mass for ~ 2 months, then LLE pain for ~ 3 weeks when she presented to ED on 02-12-15. CT AP showed nodules in lung bases suspicious for metastatic disease, liver not remarkable, no hydronephrosis, 6 mm right renal calculus, thrombus left external iliac vein into left common femoral vein, retroperitoneal and pelvic adenopathy, an 8.5 x 7.1 cm necrotic right inguinal node, post hyst ooph, no ascites, likely tumor involvement in left psoas with adjacent involvement of L4 and L5 vertebral bodies; CT chest subsequently had multiple bilateral pulmonary nodules and central adenopathy. Patient was begun on heparin qtt which was transitioned to Eliquis (on pharmaceutical assistance program). CA 125 from 02-14-15 was 642.9, this having been 3.6 in 08-2010. Pathology from right inguinal nodal mass aspiration and core needle biopsy 02-15-15 necrotic material with metastatic adenocarcinoma with immunostains consistent with gyn primary ([id number], ER+). She was transfused 2 units PRBCs for hgb 6.7. Iron studies 2-12 with serum iron 14 and %sat 7, given feraheme x2. Dr Denman George saw in consultation prior to starting carboplatin taxotere on 02-24-15. She reacted to [place] skin test prior to cycle 3, treatment continued with taxotere only thru cycle 5 on 05-26-15, tolerated neulasta poorly. She had I&D and capsulectomy of seroma cavity right inguinal region on 05-12-15. CT CAP 06-14-15 significant improvement in all areas of metastatic disease with exception of enlarging cystic mass in left hilum. Echocardiogram 07-19-15 done to evaluate central chest mass: EF 60-65% and minimal pericardial effusion. PET 08-01-15 showedsignificant progression of multiple areas of disease in addition to the chest involvement. She had first  gemzar 08-11-15, then began letrozole on 08-18-15, to be used with upcoming chest radiation.   Objective:  Vital signs in last  24 hours:  BP (!) 171/86 (BP Location: Left Arm, Patient Position: Sitting)   Pulse 90   Temp 98.9 F (37.2 C) (Oral)   Resp 18   Ht 5\' 2"  (1.575 m)   Wt 157 lb 4.8 oz (71.4 kg)   SpO2 98%   BMI 28.77 kg/m  Weight down 4 lbs Alert, oriented and appropriate. Using WC for office.  Alopecia  HEENT:PERRL, sclerae not icteric. Oral mucosa moist without lesions  No JVD upright. Lymphatics:no cervical,supraclavicular adenopathy Resp: diminished BS thru all of left lung field, no wheezes or rales, better BS right chest which is otherwise clear Cardio: tachy, regular rate and rhythm. No gallop.  GI: soft, nontender, not distended, no mass or organomegaly.  A few bowel sounds.  Musculoskeletal/ Extremities: without pitting edema, cords, tenderness Neuro: speech fluent and appropriate, moves all extremities Skin without rash, ecchymosis, petechiae Portacath-without erythema or tenderness  Lab Results:  Results for orders placed or performed in visit on 09/06/15  CBC with Differential  Result Value Ref Range   WBC 6.2 3.9 - 10.3 10e3/uL   NEUT# 5.0 1.5 - 6.5 10e3/uL   HGB 8.4 (L) 11.6 - 15.9 g/dL   HCT 11/06/15 (L) 10.3 - 12.8 %   Platelets 482 (H) 145 - 400 10e3/uL   MCV 75.5 (L) 79.5 - 101.0 fL   MCH 24.6 (L) 25.1 - 34.0 pg   MCHC 32.7 31.5 - 36.0 g/dL   RBC 11.8 (L) 8.67 - 7.37 10e6/uL   RDW 18.3 (H) 11.2 - 14.5 %   lymph# 0.3 (L) 0.9 - 3.3 10e3/uL   MONO# 0.8 0.1 - 0.9 10e3/uL   Eosinophils Absolute 0.1 0.0 - 0.5 10e3/uL   Basophils Absolute 0.0 0.0 - 0.1 10e3/uL   NEUT% 80.2 (H) 38.4 - 76.8 %   LYMPH% 4.4 (L) 14.0 - 49.7 %   MONO% 12.5 0.0 - 14.0 %   EOS% 2.2 0.0 - 7.0 %   BASO% 0.7 0.0 - 2.0 %  Comprehensive metabolic panel  Result Value Ref Range   Sodium 137 136 - 145 mEq/L   Potassium 3.6 3.5 - 5.1 mEq/L   Chloride 102 98 - 109 mEq/L   CO2 23 22 - 29 mEq/L   Glucose 102 70 - 140 mg/dl   BUN 6.2 (L) 7.0 - 3.66 mg/dL   Creatinine 0.7 0.6 - 1.1 mg/dL   Total  Bilirubin 81.5 0.20 - 1.20 mg/dL   Alkaline Phosphatase 92 40 - 150 U/L   AST 8 5 - 34 U/L   ALT <9 0 - 55 U/L   Total Protein 7.3 6.4 - 8.3 g/dL   Albumin 2.6 (L) 3.5 - 5.0 g/dL   Calcium 9.1 8.4 - 9.47 mg/dL   Anion Gap 12 (H) 3 - 11 mEq/L   EGFR >90 >90 ml/min/1.73 m2     Studies/Results:  No results found.  Medications: I have reviewed the patient's current medications. On letrozole since 08-18-15 Recommended taking antiemetics on more regular basis over next week or so to see if that allows any better po intake.  Nausea with dilaudid, tolerates itching from MS better than this.    DISCUSSION Patient has not completed Advance DIrectives, but allowed full discussion together with 3 family members now. She understands that this metastatic cancer is not curable and that resuscitation/ life support would not improve  this underlying primary problem. She understands that interventions short of resuscitation and life support can be used even with DNR. Patient does not want resuscitation or life support in event of worsening of condition related to this extensively metastatic malignancy; family does not voice any disagreement. Out of facility DNR signed and given to them to use if EMS has to come to home. Patient aware that I will document DNR in this EMR; she does not wish to have Advance Directives notarized, may or may not choose to appoint HCPOA.   Medications as above  WIll try to coordinate IVF later this week with radiation, as this was helpful with symptoms on 08-31-15. WIll decide about resuming gemzar after completion of radiation, depending on performance status. WIll continue letrozole for now. Would be Hospice appropriate when chemo DCd.  Assessment/Plan:  1.metastatic adenocarcinoma of gyn primary, in patient with history of IC clear cell ovarian and IA high grade endometrial carcinomas 03-2008 (incomplete staging), post TAH BSO, adjuvant carboplatin taxol x 6 cycles and  radiation. Lost to follow up after 11-2010 until presented with metastatic gyn cancer 02-2015. Initial response to [place] taxotere, then [place] reaction and progressive peripheral neuropathy with taxane. Extensive progression including left chest, gemzar x 1 8-10 -17 then held for radiation  to left lung.  Letrozole now for some coverage with radiation.  Will decide re further gemzar after radiation completes. Post I&D with capsulectomy of seroma cavity 05-12-15, healed. adenocarcinoma in that path. 2.DNR per discussion with patient now. 3.LLE DVT documented 02-12-15: on Eliquis, no longer symptomatic. No IVC filter. Eliquis approved thru [employer] assistance x 1 year.  4. PRBCs given 08-08-15 for symptomatic anemia. She has had feraheme x 2 in Feb/March 2017 5. chemo nausea and vomiting improved with aloxi and protonix with prior regimen. PO intake decreased, appetite decreased with progression of disease now. Encouraged supplements.  6.uncontrolled HTN initially: on labetalol 200 mg tid per urgent care + clonidine 0.1 mg tid and HCTZ. BP much better now. She has basically refused to get PCP. 7.Itching with morphine, nausea with dilaudid. May need to try duragesic. 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 Stable or slightly progressive, feet >hands with additional taxane recently. 10.corrected calcium improved since IV bisphosphonate as zometa 08-18-15 11. hypoK improved on supplement 12.overdue mammograms, general medical care, colonoscopy. Noncompliance with follow up after cancer diagnoses and adjuvant treatment after ~ 2012. Noncompliant with care for HTN over past several years, despite family efforts ( daughter is pediatrician) Needs PCP but has not followed thru with referral to [facility]. 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 Repeat colonoscopy recommended 10-2009, apparently not done. Note iron deficiency. FOB  not yet done, see above. 14.poor nutritional status, probably inadequate po fluids: as above   All questions answered and they know to call if concerns prior to next scheduled visit. Time spent 25 min including >50% counseling and coordination of care.    Evlyn Clines, MD   09/06/2015, 12:32 PM

## 2015-09-06 NOTE — Progress Notes (Signed)
Ms. Generette presents for her 9th fraction of radiation to her Left Lung. She denies pain. She will take morphine 15 mg once daily (or less frequently) for back pain. She reports nausea and vomiting about twice daily. She tells me it has increased since beginning radiation. She has been ativan twice daily with some relief. She does need a refill for this medicine that was prescribed by Dr. Marko Plume. She reports fatigue. She has shortness of breath even while resting. She has a cough, but tells me it is not productive. She does tell me that her cough will sometimes bring on vomiting. Her skin is normal appearing over her radiation site, and she is using sonafine twice daily. She has a decreased appetite, and tells me she is not eating well.   BP (!) 169/98   Pulse 94   Temp 99.8 F (37.7 C)   Ht 5\' 2"  (1.575 m)   Wt 157 lb 4.8 oz (71.4 kg)   SpO2 99% Comment: room air  BMI 28.77 kg/m    Wt Readings from Last 3 Encounters:  09/06/15 157 lb 4.8 oz (71.4 kg)  08/30/15 159 lb 6.4 oz (72.3 kg)  08/18/15 161 lb 11.2 oz (73.3 kg)

## 2015-09-07 ENCOUNTER — Ambulatory Visit
Admission: RE | Admit: 2015-09-07 | Discharge: 2015-09-07 | Disposition: A | Discharge status: Home / Self Care | Payer: Medicaid Other | Source: Ambulatory Visit | Attending: Radiation Oncology | Admitting: Radiation Oncology

## 2015-09-07 DIAGNOSIS — Z510 Encounter for antineoplastic radiation therapy: Secondary | ICD-10-CM | POA: Diagnosis not present

## 2015-09-08 ENCOUNTER — Ambulatory Visit
Admission: RE | Admit: 2015-09-08 | Discharge: 2015-09-08 | Disposition: A | Discharge status: Home / Self Care | Payer: Medicaid Other | Source: Ambulatory Visit | Attending: Radiation Oncology | Admitting: Radiation Oncology

## 2015-09-08 DIAGNOSIS — Z510 Encounter for antineoplastic radiation therapy: Secondary | ICD-10-CM | POA: Diagnosis not present

## 2015-09-09 ENCOUNTER — Ambulatory Visit
Admission: RE | Admit: 2015-09-09 | Discharge: 2015-09-09 | Disposition: A | Discharge status: Home / Self Care | Payer: Medicaid Other | Source: Ambulatory Visit | Attending: Radiation Oncology | Admitting: Radiation Oncology

## 2015-09-09 ENCOUNTER — Ambulatory Visit (HOSPITAL_BASED_OUTPATIENT_CLINIC_OR_DEPARTMENT_OTHER): Payer: Medicaid Other

## 2015-09-09 DIAGNOSIS — C801 Malignant (primary) neoplasm, unspecified: Secondary | ICD-10-CM

## 2015-09-09 DIAGNOSIS — C7951 Secondary malignant neoplasm of bone: Secondary | ICD-10-CM

## 2015-09-09 DIAGNOSIS — C778 Secondary and unspecified malignant neoplasm of lymph nodes of multiple regions: Secondary | ICD-10-CM | POA: Diagnosis not present

## 2015-09-09 DIAGNOSIS — R112 Nausea with vomiting, unspecified: Secondary | ICD-10-CM | POA: Diagnosis present

## 2015-09-09 DIAGNOSIS — C569 Malignant neoplasm of unspecified ovary: Secondary | ICD-10-CM

## 2015-09-09 DIAGNOSIS — C7802 Secondary malignant neoplasm of left lung: Secondary | ICD-10-CM

## 2015-09-09 DIAGNOSIS — C7989 Secondary malignant neoplasm of other specified sites: Secondary | ICD-10-CM

## 2015-09-09 DIAGNOSIS — C7801 Secondary malignant neoplasm of right lung: Secondary | ICD-10-CM | POA: Diagnosis not present

## 2015-09-09 DIAGNOSIS — Z510 Encounter for antineoplastic radiation therapy: Secondary | ICD-10-CM | POA: Diagnosis not present

## 2015-09-09 MED ORDER — DEXTROSE-NACL 5-0.9 % IV SOLN
INTRAVENOUS | Status: DC
  Administered 2015-09-09: 11:00:00 via INTRAVENOUS

## 2015-09-09 MED ORDER — HEPARIN SOD (PORK) LOCK FLUSH 100 UNIT/ML IV SOLN
500.0000 [IU] | Freq: Once | INTRAVENOUS | Status: AC
  Administered 2015-09-09: 500 [IU] via INTRAVENOUS
  Filled 2015-09-09: qty 5

## 2015-09-09 MED ORDER — SODIUM CHLORIDE 0.9% FLUSH
10.0000 mL | INTRAVENOUS | Status: DC | PRN
  Administered 2015-09-09: 10 mL via INTRAVENOUS
  Filled 2015-09-09: qty 10

## 2015-09-09 NOTE — Patient Instructions (Signed)

## 2015-09-12 ENCOUNTER — Emergency Department (HOSPITAL_COMMUNITY)
Admission: EM | Admit: 2015-09-12 | Discharge: 2015-09-12 | Disposition: A | Discharge status: Home / Self Care | Payer: Medicaid Other | Attending: Emergency Medicine | Admitting: Emergency Medicine

## 2015-09-12 ENCOUNTER — Ambulatory Visit: Payer: Medicaid Other

## 2015-09-12 ENCOUNTER — Encounter (HOSPITAL_COMMUNITY): Payer: Self-pay | Admitting: *Deleted

## 2015-09-12 ENCOUNTER — Emergency Department (HOSPITAL_COMMUNITY): Payer: Medicaid Other

## 2015-09-12 ENCOUNTER — Telehealth: Payer: Self-pay | Admitting: Oncology

## 2015-09-12 DIAGNOSIS — R112 Nausea with vomiting, unspecified: Secondary | ICD-10-CM | POA: Diagnosis present

## 2015-09-12 DIAGNOSIS — Z8543 Personal history of malignant neoplasm of ovary: Secondary | ICD-10-CM | POA: Diagnosis not present

## 2015-09-12 DIAGNOSIS — Z8542 Personal history of malignant neoplasm of other parts of uterus: Secondary | ICD-10-CM | POA: Diagnosis not present

## 2015-09-12 DIAGNOSIS — I10 Essential (primary) hypertension: Secondary | ICD-10-CM | POA: Insufficient documentation

## 2015-09-12 DIAGNOSIS — Z79899 Other long term (current) drug therapy: Secondary | ICD-10-CM | POA: Insufficient documentation

## 2015-09-12 DIAGNOSIS — Z85118 Personal history of other malignant neoplasm of bronchus and lung: Secondary | ICD-10-CM | POA: Insufficient documentation

## 2015-09-12 DIAGNOSIS — Z85858 Personal history of malignant neoplasm of other endocrine glands: Secondary | ICD-10-CM | POA: Insufficient documentation

## 2015-09-12 LAB — COMPREHENSIVE METABOLIC PANEL
ALBUMIN: 3.4 g/dL — AB (ref 3.5–5.0)
ALT: 8 U/L — ABNORMAL LOW (ref 14–54)
ANION GAP: 14 (ref 5–15)
AST: 10 U/L — AB (ref 15–41)
Alkaline Phosphatase: 90 U/L (ref 38–126)
BUN: 9 mg/dL (ref 6–20)
CHLORIDE: 102 mmol/L (ref 101–111)
CO2: 23 mmol/L (ref 22–32)
Calcium: 9.6 mg/dL (ref 8.9–10.3)
Creatinine, Ser: 0.73 mg/dL (ref 0.44–1.00)
GFR calc Af Amer: >60 mL/min (ref >60)
GLUCOSE: 117 mg/dL — AB (ref 65–99)
POTASSIUM: 3.2 mmol/L — AB (ref 3.5–5.1)
Sodium: 139 mmol/L (ref 135–145)
Total Bilirubin: 1 mg/dL (ref 0.3–1.2)
Total Protein: 8.4 g/dL — ABNORMAL HIGH (ref 6.5–8.1)

## 2015-09-12 LAB — URINALYSIS, ROUTINE W REFLEX MICROSCOPIC
Bilirubin Urine: NEGATIVE
GLUCOSE, UA: NEGATIVE mg/dL
Hgb urine dipstick: NEGATIVE
KETONES UR: 40 mg/dL — AB
NITRITE: NEGATIVE
PH: 7 (ref 5.0–8.0)
Protein, ur: NEGATIVE mg/dL
SPECIFIC GRAVITY, URINE: 1.01 (ref 1.005–1.030)

## 2015-09-12 LAB — CBC
HEMATOCRIT: 31.9 % — AB (ref 36.0–46.0)
HEMOGLOBIN: 10.1 g/dL — AB (ref 12.0–15.0)
MCH: 24.1 pg — ABNORMAL LOW (ref 26.0–34.0)
MCHC: 31.7 g/dL (ref 30.0–36.0)
MCV: 76.1 fL — AB (ref 78.0–100.0)
Platelets: 469 10*3/uL — ABNORMAL HIGH (ref 150–400)
RBC: 4.19 MIL/uL (ref 3.87–5.11)
RDW: 17.9 % — AB (ref 11.5–15.5)
WBC: 8.1 10*3/uL (ref 4.0–10.5)

## 2015-09-12 LAB — URINE MICROSCOPIC-ADD ON: RBC / HPF: NONE SEEN RBC/hpf (ref 0–5)

## 2015-09-12 LAB — LIPASE, BLOOD: LIPASE: 16 U/L (ref 11–51)

## 2015-09-12 MED ORDER — ONDANSETRON 8 MG PO TBDP: 8.0000 mg | ORAL_TABLET | Freq: Three times a day (TID) | ORAL | 0 refills | Status: DC | PRN

## 2015-09-12 MED ORDER — ONDANSETRON 4 MG PO TBDP
4.0000 mg | ORAL_TABLET | Freq: Once | ORAL | Status: AC | PRN
  Administered 2015-09-12: 4 mg via ORAL
  Filled 2015-09-12: qty 1

## 2015-09-12 MED ORDER — METOCLOPRAMIDE HCL 5 MG/ML IJ SOLN
10.0000 mg | Freq: Once | INTRAMUSCULAR | Status: AC
  Administered 2015-09-12: 10 mg via INTRAVENOUS
  Filled 2015-09-12: qty 2

## 2015-09-12 MED ORDER — SODIUM CHLORIDE 0.9 % IV BOLUS (SEPSIS)
1000.0000 mL | Freq: Once | INTRAVENOUS | Status: AC
  Administered 2015-09-12: 1000 mL via INTRAVENOUS

## 2015-09-12 MED ORDER — PROMETHAZINE HCL 25 MG/ML IJ SOLN
12.5000 mg | Freq: Once | INTRAMUSCULAR | Status: AC
  Administered 2015-09-12: 12.5 mg via INTRAVENOUS
  Filled 2015-09-12: qty 1

## 2015-09-12 MED ORDER — PROMETHAZINE HCL 25 MG PO TABS: 25.0000 mg | ORAL_TABLET | Freq: Four times a day (QID) | ORAL | 0 refills | Status: DC | PRN

## 2015-09-12 NOTE — ED Notes (Signed)
Pt aware awaiting phenergan from main pharmacy.

## 2015-09-12 NOTE — Telephone Encounter (Signed)
Patient's relative, [name] called and said [name] needs to cancel radiation treatment today because she is not feeling well.  She said she was not able to keep any food or water down yesterday.  She is taking her anti nausea medication and is feeling better today.  Advised her to take Milderd to the ER if she starts feeling worse.  Notified Anna RT on [place] [address].

## 2015-09-12 NOTE — ED Triage Notes (Signed)
Pt complains of nausea/vomiting for the past 2 days. Pt denies abdominal pain or diarrhea. Pt has cancer, pt had radiation on Friday. Pt last had chemo in July.

## 2015-09-12 NOTE — ED Notes (Signed)
Pt complaint of minimal nausea at present time.

## 2015-09-12 NOTE — ED Notes (Signed)
Patient unable to give urine sample at this time, denies being able to stand up

## 2015-09-12 NOTE — ED Notes (Signed)
Pt aware that a urine sample is needed. Pt unable to urinate at this time. RN informed pt that she would wait until IV fluids finish and then see if she can give a sample.

## 2015-09-12 NOTE — ED Provider Notes (Signed)
[place] DEPT Provider Note   CSN: [id number] Arrival date & time: 09/12/15  1357     History   Chief Complaint Chief Complaint  Patient presents with  . Nausea  . Emesis    HPI [name] is a 60 y.o. female.  HPI  [name] is a 60 y.o. female with history of metastatic ovarian cancer, currently undergoing radiation therapy, presents to emergency department with complaint of nausea and vomiting. Patient states she finished chemotherapy 2 months ago, and currently receives radiation therapy 5 days a week. She states 3 days ago developed nausea and vomiting. She reports some nausea and vomiting with her treatments, however it has never been this severe. She has been taking Ativan at home with no improvement of her symptoms. She states she is unable to keep anything down including fluids and her medications. She denies any pain. No fever or chills. Denies any diarrhea, last normal bowel movement was yesterday. Denies any blood in her stool or emesis. Denies any abdominal pain. No urinary symptoms.   Past Medical History:  Diagnosis Date  . Chemotherapy-induced neuropathy ([place])   . DVT, lower extremity ([place])    left  02-12-2015  currently on Eliqius  . Endometrial cancer [facility]) oncologist-  dr Marko Plume   02-2015  . 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 Family history of breast cancer   . History of adenomatous polyp of colon    tubulovillious adenoma 09/ 2010  . History of ovarian cyst    complex  . History of radiation therapy 10/21/08-11/23/08 & 12/07/10/13/12/23 2010   ENDOMETRIOID   . History of uterine fibroid   . Hypertension   . Inguinal fluid collection 02/2015   right-drained x2  . Iron deficiency anemia    chronic severe  . Metastasis to lung ([place])   . 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 DNR (do not resuscitate) 09/06/2015  . Malignant neoplasm metastatic to lung ([place])  08/20/2015  . 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 Metastatic cancer to intra-abdominal lymph nodes ([place]) 08/20/2015  . Genetic testing 07/07/2015  . Anemia due to antineoplastic chemotherapy 06/11/2015  . Adenomatous colon polyp 05/10/2015  . Drug reaction 05/10/2015  . Mass of right inguinal region 05/10/2015  . Family history of breast cancer   . Inguinal lymphocyst 04/22/2015  . Chemotherapy induced nausea and vomiting 03/05/2015  . DVT (deep venous thrombosis), left 03/05/2015  . Poor venous access 02/22/2015  . Constipation due to pain medication 02/22/2015  . Noncompliance with medication regimen 02/22/2015  . Chronic anticoagulation 02/22/2015  . Leg DVT (deep venous thromboembolism), acute ([place]) 02/22/2015  . Chemotherapy-induced neuropathy ([place]) 02/22/2015  . Cancer associated pain 02/22/2015  . Iron deficiency anemia due to chronic blood loss 02/22/2015  . International Federation of Gynecology and Obstetrics (FIGO) stage IVB epithelial ovarian cancer ([place]) 02/22/2015  . Need for prophylactic vaccination and inoculation against influenza 02/22/2015  . Anemia of chronic disease 02/14/2015  . Anemia, iron deficiency 02/14/2015  . Uterine cancer ([place]) 02/12/2015  . Hypokalemia 02/12/2015  . Endometrial cancer ([place]) 11/20/2010  . Essential hypertension 09/23/2008  . NONSPECIFIC ABN FINDING RAD & OTH EXAM GI TRACT 09/23/2008    Past Surgical History:  Procedure Laterality Date  . ABDOMINAL HYSTERECTOMY  2010  . APPLICATION OF WOUND VAC Right 05/12/2015   Procedure: APPLICATION OF WOUND VAC;  Surgeon:  Hall Busing, MD;  Location: [facility];  Service: General;  Laterality: Right;  . COLONOSCOPY W/ POLYPECTOMY  09-27-2008  . EXPLORATORY LAPAROTOMY /  TOTAL ABDOMINAL HYSTERECTOMY/ BILATERAL SALPINOOPHORECTOMY/  PARTIAL OMENTECTOMY  03-18-2008  . INGUINAL HERNIA REPAIR Right 05/12/2015   Procedure: INCISION AND DRAINGE RIGHT  INGUINAL FLUID COLLECTION ;  Surgeon: Hall Busing, MD;  Location: [facility];  Service: General;  Laterality: Right;  . IR GENERIC HISTORICAL  08/10/2015   IR US GUIDE VASC ACCESS RIGHT 08/10/2015 Jacqulynn Cadet, MD [facility]-INTERV RAD  . IR GENERIC HISTORICAL  08/10/2015   IR FLUORO GUIDE CV LINE RIGHT 08/10/2015 Jacqulynn Cadet, MD [facility]-INTER[facility]  . porta cath  06/2010   removal  . TUBAL LIGATION  1990's  . UMBILICAL HERNIA REPAIR     infant    OB History    No data available       Home Medications    Prior to Admission medications   Medication Sig Start Date End Date Taking? Authorizing Provider  apixaban (ELIQUIS) 5 MG TABS tablet Take 1 tablet (5 mg total) by mouth 2 (two) times daily. 03/03/15   Lennis Marion Downer, MD  benzonatate (TESSALON) 100 MG capsule Take 1 capsule (100 mg total) by mouth 3 (three) times daily as needed for cough. 08/22/15   Lennis Marion Downer, MD  chlorpheniramine-HYDROcodone (TUSSIONEX) 10-8 MG/5ML SUER Take 5 ml by mouth every 8-12 hours as needed for cough. Patient not taking: Reported on 09/06/2015 08/15/15   Gery Pray, MD  cloNIDine (CATAPRES) 0.1 MG tablet Take 0.1 mg by mouth daily.  02/25/15   Historical Provider, MD  dexamethasone (DECADRON) 4 MG tablet Take 2 tablets (8 mg total) by mouth 2 (two) times daily. For 3 days.  Start the day before Taxotere. Patient not taking: Reported on 08/04/2015 06/09/15   Gordy Levan, MD  ferrous sulfate 325 (65 FE) MG tablet Take 1 tablet (325 mg total) by mouth 2 (two) times daily with a meal. 02/17/15   Robbie Lis, MD  hydrochlorothiazide (HYDRODIURIL) 25 MG tablet Take 25 mg by mouth daily.   11/01/08   Historical Provider, MD  HYDROmorphone (DILAUDID) 2 MG tablet Take 1-2 tablets (2-4 mg total) by mouth every 4 (four) hours as needed for severe pain. Patient not taking: Reported on 08/30/2015 08/18/15   Gordy Levan, MD  labetalol (NORMODYNE) 100 MG tablet Take 2 tablets (200 mg total) by mouth 3 (three)  times daily. 08/25/15   Lennis Marion Downer, MD  letrozole (FEMARA) 2.5 MG tablet Take 1 tablet (2.5 mg total) by mouth daily. 08/18/15   Lennis Marion Downer, MD  lidocaine-prilocaine (EMLA) cream Apply to Porta-cath 1-2 hrs prior to access as directed. 08/04/15   Lennis Marion Downer, MD  loratadine (CLARITIN) 10 MG tablet Take 10 mg by mouth daily. Reported on 07/07/2015    Historical Provider, MD  LORazepam (ATIVAN) 0.5 MG tablet Place 1 tablet under the tongue or swallow every 6 hrs as needed for nausea. 09/06/15   Gery Pray, MD  morphine (MSIR) 15 MG tablet Take 1 tablet (15 mg total) by mouth every 6 (six) hours as needed for moderate pain or severe pain. 08/04/15   Lennis Marion Downer, MD  pantoprazole (PROTONIX) 40 MG tablet Take 1 tablet (40 mg total) by mouth daily. 03/14/15   Lennis Marion Downer, MD  polyethylene glycol (MIRALAX / GLYCOLAX) packet Take 17 g by mouth 2 (two) times daily as needed.  Reported on 05/23/2015    Historical Provider, MD  potassium chloride (K-DUR) 10 MEQ tablet Take 2 tablets today and then 1 tablet daily 05/23/15   Lennis Marion Downer, MD  Wound Dressings (SONAFINE EX) Apply topically.    Historical Provider, MD  zolpidem (AMBIEN) 5 MG tablet Take 1 tablet (5 mg total) by mouth at bedtime as needed for sleep. 09/02/15   Lennis Marion Downer, MD    Family History Family History  Problem Relation Age of Onset  . Bone cancer Paternal Grandmother     OSSEOUS METASTASIS  . Heart disease Father   . Heart disease Sister   . Bone cancer Maternal Grandmother 75  . Leukemia Maternal Grandfather 34  . Liver cancer Maternal Uncle   . Breast cancer Cousin 51    maternal first cousin    Social History Social History  Substance Use Topics  . Smoking status: Never Smoker  . Smokeless tobacco: Never Used  . Alcohol use No     Allergies   Morphine and related and Dilaudid [hydromorphone hcl]   Review of Systems Review of Systems  Constitutional: Negative for chills and fever.    Respiratory: Negative for cough, chest tightness and shortness of breath.   Cardiovascular: Negative for chest pain, palpitations and leg swelling.  Gastrointestinal: Positive for nausea and vomiting. Negative for abdominal pain and diarrhea.  Genitourinary: Negative for dysuria, flank pain and pelvic pain.  Musculoskeletal: Negative for arthralgias, myalgias, neck pain and neck stiffness.  Skin: Negative for rash.  Neurological: Negative for dizziness, weakness and headaches.  All other systems reviewed and are negative.    Physical Exam Updated Vital Signs BP (!) 182/103 (BP Location: Left Arm)   Pulse 101   Temp 98.2 F (36.8 C) (Oral)   Resp 18   Wt 71.2 kg   SpO2 100%   BMI 28.72 kg/m   Physical Exam  Constitutional: She appears well-developed and well-nourished. No distress.  HENT:  Head: Normocephalic.  Eyes: Conjunctivae are normal.  Neck: Neck supple.  Cardiovascular: Normal rate, regular rhythm and normal heart sounds.   Pulmonary/Chest: Effort normal and breath sounds normal. No respiratory distress. She has no wheezes. She has no rales.  Abdominal: Soft. Bowel sounds are normal. She exhibits no distension. There is no tenderness. There is no rebound and no guarding.  Musculoskeletal: She exhibits no edema.  Neurological: She is alert.  Skin: Skin is warm and dry.  Psychiatric: She has a normal mood and affect. Her behavior is normal.  Nursing note and vitals reviewed.    ED Treatments / Results  Labs (all labs ordered are listed, but only abnormal results are displayed) Labs Reviewed  COMPREHENSIVE METABOLIC PANEL - Abnormal; Notable for the following:       Result Value   Potassium 3.2 (*)    Glucose, Bld 117 (*)    Total Protein 8.4 (*)    Albumin 3.4 (*)    AST 10 (*)    ALT 8 (*)    All other components within normal limits  CBC - Abnormal; Notable for the following:    Hemoglobin 10.1 (*)    HCT 31.9 (*)    MCV 76.1 (*)    MCH 24.1 (*)     RDW 17.9 (*)    Platelets 469 (*)    All other components within normal limits  LIPASE, BLOOD  URINALYSIS, ROUTINE W REFLEX MICROSCOPIC (NOT AT [facility])    EKG  EKG Interpretation None  Radiology Dg Abdomen Acute W/chest  Result Date: 09/12/2015 CLINICAL DATA:  Endometrial cancer with lung metastases EXAM: DG ABDOMEN ACUTE W/ 1V CHEST COMPARISON:  PET-CT dated 08/01/2015 FINDINGS: Left upper lobe masses, better evaluated on recent PET-CT. Mild blunting of the left costophrenic angle. Right lung is clear. No pneumothorax. Cardiomegaly. Right chest power port terminates in the lower SVC. Nonobstructive bowel gas pattern. No evidence of free air on the lateral decubitus view. Visualized osseous structures are within normal limits. IMPRESSION: Left upper lobe masses, better evaluated on recent PET-CT. No evidence of small bowel obstruction or free air. Electronically Signed   By: Julian Hy M.D.   On: 09/12/2015 17:01    Procedures Procedures (including critical care time)  Medications Ordered in ED Medications  ondansetron (ZOFRAN-ODT) disintegrating tablet 4 mg (4 mg Oral Given 09/12/15 1411)  sodium chloride 0.9 % bolus 1,000 mL (0 mLs Intravenous Stopped 09/12/15 1802)  sodium chloride 0.9 % bolus 1,000 mL (0 mLs Intravenous Stopped 09/12/15 1720)  promethazine (PHENERGAN) injection 12.5 mg (12.5 mg Intravenous Given 09/12/15 1700)  metoCLOPramide (REGLAN) injection 10 mg (10 mg Intravenous Given 09/12/15 1910)     Initial Impression / Assessment and Plan / ED Course  I have reviewed the triage vital signs and the nursing notes.  Pertinent labs & imaging results that were available during my care of the patient were reviewed by me and considered in my medical decision making (see chart for details).  Clinical Course  Comment By Time  Pt with nausea, vomiting. Currently being treated for metastatic ovarian ca with radiation. Last chemotherapy in July. She denies any  abdominal pain, no fever or chills, normal bowel movement yesterday. No urinary symptoms. No relief of her nausea with Ativan at home. We'll try to hydrate, check labs, UA, abdominal series, tried to get nausea under control. Abdomen is completely benign and nontender at this time. Jeannett Senior, PA-C 09/11 [phone]  Patient with improvement in her symptoms after Phenergan and fluids. She is able to drink water. She states she still feels "a little queasy." But denies any more episodes of vomiting. Will try Reglan and another by mouth challenge. Jeannett Senior, PA-C 09/11 1823  Pt feeling better. Was able to drink a cup of water. Wants to try to go home. Will dc home with zofran and phenergan. Pt will see her oncologist tomorrow. Return precautions discussed.  Jeannett Senior, PA-C 09/11 2014    Patient with nausea and vomiting, currently being treated for metastatic ovarian cancer. Patient is afebrile, nontoxic appearing. Abdomen is nontender, no abdominal pain reported, no change in bowel movements. Patient was hydrated with fluids, labs and abdominal x-ray did not show any emergent findings. Patient is able to drink in emergency department. Patient was offered admission overnight for hydration and antiemetics, however she feels better and wants to try to go home. We'll discharge home with Zofran and Phenergan. Patient started to take all her regular medications. Follow-up tomorrow with her oncologist. Return if worsening  Final Clinical Impressions(s) / ED Diagnoses   Final diagnoses:  Non-intractable vomiting with nausea, vomiting of unspecified type    New Prescriptions Discharge Medication List as of 09/12/2015  8:21 PM    START taking these medications   Details  ondansetron (ZOFRAN ODT) 8 MG disintegrating tablet Take 1 tablet (8 mg total) by mouth every 8 (eight) hours as needed for nausea or vomiting., Starting Mon 09/12/2015, Print    promethazine (PHENERGAN) 25 MG tablet Take 1  tablet (  25 mg total) by mouth every 6 (six) hours as needed for nausea or vomiting., Starting Mon 09/12/2015, Print         Jeannett Senior, PA-C 09/12/15 2057    Alfonzo Beers, MD 09/12/15 2103

## 2015-09-12 NOTE — Discharge Instructions (Signed)
Take zofran for nausea as prescribed as needed. Take phenergan for additional nausea relief. Follow up with your doctor tomorrow. Return if worsening symptoms, unable to keep down fluids or your medications, if develop abdominal pain, fever, any new concerning symptom.

## 2015-09-13 ENCOUNTER — Ambulatory Visit
Admission: RE | Admit: 2015-09-13 | Discharge: 2015-09-13 | Disposition: A | Discharge status: Home / Self Care | Payer: Medicaid Other | Source: Ambulatory Visit | Attending: Radiation Oncology | Admitting: Radiation Oncology

## 2015-09-13 ENCOUNTER — Ambulatory Visit: Payer: Medicaid Other

## 2015-09-13 VITALS — BP 187/105 | HR 88 | Temp 98.5°F | Resp 18 | Ht 62.0 in | Wt 148.1 lb

## 2015-09-13 DIAGNOSIS — Z510 Encounter for antineoplastic radiation therapy: Secondary | ICD-10-CM | POA: Diagnosis not present

## 2015-09-13 DIAGNOSIS — C541 Malignant neoplasm of endometrium: Secondary | ICD-10-CM

## 2015-09-13 NOTE — Progress Notes (Signed)
  Radiation Oncology         [phone] ________________________________  Name: [name] MRN: [id number]  Date: 09/13/2015  DOB: June 18, 1955  Weekly Radiation Therapy Management    ICD-9-CM ICD-10-CM   1. Endometrial cancer ([place]) 182.0 C54.1      j  Metastatic ovarian cancer   Current Dose: 32.5 Gy     Planned Dose:  37.5 Gy  Narrative . . . . . . . . The patient presents for routine under treatment assessment.                                  [name] has completed 13 fractions to her left lung. She denies having pain today. She reports she is coughing nonproductive. She reports having occasional shortness of breath with exertion She reports having a poor appetite and having fatigue all day.  Went to the ER with nausea and vomiting on yesterday x 3 days. The patient reports that she is currently taking Zofran.  She was also given Phenergan which was helpful. She will continue on both these as needed for her nausea                                  Set-up films were reviewed.                                 The chart was checked. Physical Findings. . .  height is 5\' 2"  (1.575 m) and weight is 148 lb 1.6 oz (67.2 kg). Her oral temperature is 98.5 F (36.9 C). Her blood pressure is 187/105 (abnormal) and her pulse is 88. Her respiration is 18 and oxygen saturation is 97%.    Lungs are clear to auscultation bilaterally. Heart has regular rate and rhythm.   Impression . . . . . . . The patient is tolerating radiation. Plan . . . . . . . . . . . . Continue treatment as planned. I told the patient that if she was feeling dehydrated that we would give her fluids in clinic. The patient will finish radiotherapy this week, and I will follow up appropriately with her.  ________________________________   Blair Promise, PhD, MD  This document serves as a record of services personally performed by Gery Pray, MD. It was created on his behalf by Truddie Hidden, a trained  medical scribe. The creation of this record is based on the scribe's personal observations and the provider's statements to them. This document has been checked and approved by the attending provider.

## 2015-09-13 NOTE — Progress Notes (Addendum)
[name] has completed 13 fractions to her left lung.  She denies having pain today.  She reports she is coughing nonproductive.  She reports having occasional shortness of breath with exertion  She reports having a poor appetite and having fatigue all day.  Went to the ER with nausea and vomiting on yesterday x 3 days.   Wt Readings from Last 3 Encounters:  09/13/15 148 lb 1.6 oz (67.2 kg)  09/12/15 157 lb (71.2 kg)  09/06/15 157 lb 4.8 oz (71.4 kg)  BP (!) 187/105 (BP Location: Right Arm, Patient Position: Sitting, Cuff Size: Normal)   Pulse 88   Temp 98.5 F (36.9 C) (Oral)   Resp 18   Ht 5\' 2"  (1.575 m)   Wt 148 lb 1.6 oz (67.2 kg)   SpO2 97%   BMI 27.09 kg/m

## 2015-09-14 ENCOUNTER — Ambulatory Visit: Payer: Medicaid Other

## 2015-09-14 ENCOUNTER — Ambulatory Visit
Admission: RE | Admit: 2015-09-14 | Discharge: 2015-09-14 | Disposition: A | Discharge status: Home / Self Care | Payer: Medicaid Other | Source: Ambulatory Visit | Attending: Radiation Oncology | Admitting: Radiation Oncology

## 2015-09-14 ENCOUNTER — Telehealth: Payer: Self-pay | Admitting: Oncology

## 2015-09-14 ENCOUNTER — Other Ambulatory Visit: Payer: Self-pay | Admitting: Oncology

## 2015-09-14 DIAGNOSIS — Z510 Encounter for antineoplastic radiation therapy: Secondary | ICD-10-CM | POA: Diagnosis not present

## 2015-09-14 MED ORDER — SUCRALFATE 1 GM/10ML PO SUSP: 1.0000 g | Freq: Three times a day (TID) | ORAL | 0 refills | Status: DC

## 2015-09-14 NOTE — Telephone Encounter (Signed)
Patient's daughter left a message asking that a prescription for the medication "to sooth her throat" be sent in today.  She requested a return call.

## 2015-09-14 NOTE — Telephone Encounter (Signed)
Called Kia back and she said Arlone has had a sore throat over the weekend that they thought was from vomiting.  She has not been vomiting today and her throat was more sore after radiation.  Advised Dr. Sondra Come and a prescription for Carafate was sent to [facility].  Advised Kia to have Finesse take the Carafate 10-15 minutes before a meal.  Also advised her that the prescription can be expensive.  Kia verbalized agreement and understanding.

## 2015-09-15 ENCOUNTER — Ambulatory Visit
Admission: RE | Admit: 2015-09-15 | Discharge: 2015-09-15 | Disposition: A | Discharge status: Home / Self Care | Payer: Medicaid Other | Source: Ambulatory Visit | Attending: Radiation Oncology | Admitting: Radiation Oncology

## 2015-09-15 ENCOUNTER — Encounter: Payer: Self-pay | Admitting: Radiation Oncology

## 2015-09-15 ENCOUNTER — Telehealth: Payer: Self-pay | Admitting: Oncology

## 2015-09-15 DIAGNOSIS — Z510 Encounter for antineoplastic radiation therapy: Secondary | ICD-10-CM | POA: Diagnosis not present

## 2015-09-15 MED ORDER — PROMETHAZINE HCL 25 MG PO TABS: 25.0000 mg | ORAL_TABLET | Freq: Four times a day (QID) | ORAL | 1 refills | Status: DC | PRN

## 2015-09-15 NOTE — Telephone Encounter (Addendum)
Called [name] and notified her that the refill for Phenergan has been sent to [facility]. Also notified her that the mass in her lung has shrunk per Dr. Sondra Come.  She verbalized agreement and understanding.

## 2015-09-22 ENCOUNTER — Other Ambulatory Visit: Payer: Self-pay | Admitting: Oncology

## 2015-09-22 ENCOUNTER — Telehealth: Payer: Self-pay | Admitting: Oncology

## 2015-09-22 DIAGNOSIS — C7802 Secondary malignant neoplasm of left lung: Secondary | ICD-10-CM

## 2015-09-22 NOTE — Telephone Encounter (Signed)
[name] called and said she is having a lot of nausea and vomiting.  She has been taking phenergan but is not able to keep it down.  She is wondering if she can get fluids tomorrow as she will not have transportation to the [facility] today.  Advised her that an infusion appointment has been scheduled for 8 am tomorrow.  She verbalized understanding and agreement.

## 2015-09-23 ENCOUNTER — Emergency Department (HOSPITAL_COMMUNITY): Payer: Medicaid Other

## 2015-09-23 ENCOUNTER — Other Ambulatory Visit: Payer: Self-pay

## 2015-09-23 ENCOUNTER — Encounter (HOSPITAL_COMMUNITY): Payer: Self-pay

## 2015-09-23 ENCOUNTER — Telehealth: Payer: Self-pay | Admitting: *Deleted

## 2015-09-23 ENCOUNTER — Inpatient Hospital Stay (HOSPITAL_COMMUNITY)
Admission: EM | Admit: 2015-09-23 | Discharge: 2015-10-01 | DRG: 054 | Disposition: A | Discharge status: Home / Self Care | Payer: Medicaid Other | Attending: Internal Medicine | Admitting: Internal Medicine

## 2015-09-23 ENCOUNTER — Ambulatory Visit: Payer: Medicaid Other

## 2015-09-23 DIAGNOSIS — T451X5A Adverse effect of antineoplastic and immunosuppressive drugs, initial encounter: Secondary | ICD-10-CM | POA: Diagnosis present

## 2015-09-23 DIAGNOSIS — E860 Dehydration: Secondary | ICD-10-CM | POA: Diagnosis present

## 2015-09-23 DIAGNOSIS — Z7901 Long term (current) use of anticoagulants: Secondary | ICD-10-CM

## 2015-09-23 DIAGNOSIS — D638 Anemia in other chronic diseases classified elsewhere: Secondary | ICD-10-CM | POA: Diagnosis not present

## 2015-09-23 DIAGNOSIS — C541 Malignant neoplasm of endometrium: Secondary | ICD-10-CM | POA: Diagnosis not present

## 2015-09-23 DIAGNOSIS — C563 Malignant neoplasm of bilateral ovaries: Secondary | ICD-10-CM | POA: Diagnosis present

## 2015-09-23 DIAGNOSIS — C7931 Secondary malignant neoplasm of brain: Principal | ICD-10-CM | POA: Diagnosis present

## 2015-09-23 DIAGNOSIS — C7800 Secondary malignant neoplasm of unspecified lung: Secondary | ICD-10-CM | POA: Diagnosis present

## 2015-09-23 DIAGNOSIS — T402X5A Adverse effect of other opioids, initial encounter: Secondary | ICD-10-CM | POA: Diagnosis not present

## 2015-09-23 DIAGNOSIS — I82402 Acute embolism and thrombosis of unspecified deep veins of left lower extremity: Secondary | ICD-10-CM

## 2015-09-23 DIAGNOSIS — G936 Cerebral edema: Secondary | ICD-10-CM | POA: Diagnosis present

## 2015-09-23 DIAGNOSIS — I10 Essential (primary) hypertension: Secondary | ICD-10-CM | POA: Diagnosis present

## 2015-09-23 DIAGNOSIS — R079 Chest pain, unspecified: Secondary | ICD-10-CM

## 2015-09-23 DIAGNOSIS — L299 Pruritus, unspecified: Secondary | ICD-10-CM | POA: Diagnosis not present

## 2015-09-23 DIAGNOSIS — R0789 Other chest pain: Secondary | ICD-10-CM

## 2015-09-23 DIAGNOSIS — Z79811 Long term (current) use of aromatase inhibitors: Secondary | ICD-10-CM

## 2015-09-23 DIAGNOSIS — I639 Cerebral infarction, unspecified: Secondary | ICD-10-CM

## 2015-09-23 DIAGNOSIS — C561 Malignant neoplasm of right ovary: Secondary | ICD-10-CM | POA: Diagnosis present

## 2015-09-23 DIAGNOSIS — Z803 Family history of malignant neoplasm of breast: Secondary | ICD-10-CM

## 2015-09-23 DIAGNOSIS — I319 Disease of pericardium, unspecified: Secondary | ICD-10-CM

## 2015-09-23 DIAGNOSIS — Z923 Personal history of irradiation: Secondary | ICD-10-CM

## 2015-09-23 DIAGNOSIS — R262 Difficulty in walking, not elsewhere classified: Secondary | ICD-10-CM

## 2015-09-23 DIAGNOSIS — G939 Disorder of brain, unspecified: Secondary | ICD-10-CM

## 2015-09-23 DIAGNOSIS — Z8542 Personal history of malignant neoplasm of other parts of uterus: Secondary | ICD-10-CM

## 2015-09-23 DIAGNOSIS — Z79899 Other long term (current) drug therapy: Secondary | ICD-10-CM

## 2015-09-23 DIAGNOSIS — R112 Nausea with vomiting, unspecified: Secondary | ICD-10-CM | POA: Diagnosis present

## 2015-09-23 DIAGNOSIS — R1110 Vomiting, unspecified: Secondary | ICD-10-CM

## 2015-09-23 DIAGNOSIS — Z8543 Personal history of malignant neoplasm of ovary: Secondary | ICD-10-CM

## 2015-09-23 DIAGNOSIS — C775 Secondary and unspecified malignant neoplasm of intrapelvic lymph nodes: Secondary | ICD-10-CM | POA: Diagnosis present

## 2015-09-23 DIAGNOSIS — W19XXXA Unspecified fall, initial encounter: Secondary | ICD-10-CM

## 2015-09-23 DIAGNOSIS — R110 Nausea: Secondary | ICD-10-CM | POA: Diagnosis not present

## 2015-09-23 DIAGNOSIS — R509 Fever, unspecified: Secondary | ICD-10-CM

## 2015-09-23 DIAGNOSIS — E876 Hypokalemia: Secondary | ICD-10-CM | POA: Diagnosis present

## 2015-09-23 DIAGNOSIS — I3139 Other pericardial effusion (noninflammatory): Secondary | ICD-10-CM

## 2015-09-23 DIAGNOSIS — G620 Drug-induced polyneuropathy: Secondary | ICD-10-CM | POA: Diagnosis present

## 2015-09-23 DIAGNOSIS — I313 Pericardial effusion (noninflammatory): Secondary | ICD-10-CM | POA: Diagnosis present

## 2015-09-23 DIAGNOSIS — C562 Malignant neoplasm of left ovary: Secondary | ICD-10-CM

## 2015-09-23 DIAGNOSIS — Z86718 Personal history of other venous thrombosis and embolism: Secondary | ICD-10-CM

## 2015-09-23 DIAGNOSIS — T502X5A Adverse effect of carbonic-anhydrase inhibitors, benzothiadiazides and other diuretics, initial encounter: Secondary | ICD-10-CM | POA: Diagnosis not present

## 2015-09-23 DIAGNOSIS — G893 Neoplasm related pain (acute) (chronic): Secondary | ICD-10-CM | POA: Diagnosis present

## 2015-09-23 LAB — I-STAT CG4 LACTIC ACID, ED
LACTIC ACID, VENOUS: 0.82 mmol/L (ref 0.5–1.9)
LACTIC ACID, VENOUS: 0.81 mmol/L (ref 0.5–1.9)

## 2015-09-23 LAB — CBC
HEMATOCRIT: 30.6 % — AB (ref 36.0–46.0)
Hemoglobin: 9.6 g/dL — ABNORMAL LOW (ref 12.0–15.0)
MCH: 24.1 pg — ABNORMAL LOW (ref 26.0–34.0)
MCHC: 31.4 g/dL (ref 30.0–36.0)
MCV: 76.9 fL — AB (ref 78.0–100.0)
Platelets: 313 10*3/uL (ref 150–400)
RBC: 3.98 MIL/uL (ref 3.87–5.11)
RDW: 19 % — ABNORMAL HIGH (ref 11.5–15.5)
WBC: 14.5 10*3/uL — AB (ref 4.0–10.5)

## 2015-09-23 LAB — COMPREHENSIVE METABOLIC PANEL
ALT: 8 U/L — AB (ref 14–54)
AST: 11 U/L — AB (ref 15–41)
Albumin: 3.5 g/dL (ref 3.5–5.0)
Alkaline Phosphatase: 81 U/L (ref 38–126)
Anion gap: 12 (ref 5–15)
BILIRUBIN TOTAL: 0.6 mg/dL (ref 0.3–1.2)
BUN: 10 mg/dL (ref 6–20)
CHLORIDE: 102 mmol/L (ref 101–111)
CO2: 25 mmol/L (ref 22–32)
CREATININE: 0.93 mg/dL (ref 0.44–1.00)
Calcium: 9.2 mg/dL (ref 8.9–10.3)
GFR calc Af Amer: >60 mL/min (ref >60)
Glucose, Bld: 170 mg/dL — ABNORMAL HIGH (ref 65–99)
Potassium: 3 mmol/L — ABNORMAL LOW (ref 3.5–5.1)
Sodium: 139 mmol/L (ref 135–145)
Total Protein: 7.8 g/dL (ref 6.5–8.1)

## 2015-09-23 LAB — LIPASE, BLOOD: LIPASE: 12 U/L (ref 11–51)

## 2015-09-23 LAB — I-STAT TROPONIN, ED: TROPONIN I, POC: 0 ng/mL (ref 0.00–0.08)

## 2015-09-23 MED ORDER — POLYETHYLENE GLYCOL 3350 17 G PO PACK: 17.0000 g | PACK | Freq: Two times a day (BID) | ORAL | Status: DC | PRN

## 2015-09-23 MED ORDER — CLONIDINE HCL 0.1 MG PO TABS
0.1000 mg | ORAL_TABLET | Freq: Every day | ORAL | Status: DC
  Administered 2015-09-23 – 2015-09-25 (×3): 0.1 mg via ORAL
  Filled 2015-09-23 (×6): qty 1

## 2015-09-23 MED ORDER — ENSURE ENLIVE PO LIQD
237.0000 mL | Freq: Two times a day (BID) | ORAL | Status: DC
  Administered 2015-09-26 – 2015-09-30 (×3): 237 mL via ORAL

## 2015-09-23 MED ORDER — MORPHINE SULFATE (PF) 2 MG/ML IV SOLN
2.0000 mg | INTRAVENOUS | Status: DC | PRN
  Administered 2015-09-23 (×2): 2 mg via INTRAVENOUS
  Administered 2015-09-24: 4 mg via INTRAVENOUS
  Administered 2015-09-24: 2 mg via INTRAVENOUS
  Filled 2015-09-23 (×3): qty 1
  Filled 2015-09-23: qty 2

## 2015-09-23 MED ORDER — ONDANSETRON HCL 4 MG PO TABS: 4.0000 mg | ORAL_TABLET | Freq: Four times a day (QID) | ORAL | Status: DC | PRN

## 2015-09-23 MED ORDER — GI COCKTAIL ~~LOC~~
30.0000 mL | Freq: Once | ORAL | Status: AC
  Administered 2015-09-23: 30 mL via ORAL
  Filled 2015-09-23: qty 30

## 2015-09-23 MED ORDER — RANITIDINE HCL 150 MG/10ML PO SYRP: 150.0000 mg | ORAL_SOLUTION | Freq: Once | ORAL | Status: DC

## 2015-09-23 MED ORDER — DIPHENHYDRAMINE HCL 50 MG/ML IJ SOLN
12.5000 mg | Freq: Four times a day (QID) | INTRAMUSCULAR | Status: DC | PRN
  Administered 2015-09-27 – 2015-09-30 (×2): 12.5 mg via INTRAVENOUS
  Filled 2015-09-23: qty 1

## 2015-09-23 MED ORDER — APIXABAN 5 MG PO TABS
5.0000 mg | ORAL_TABLET | Freq: Two times a day (BID) | ORAL | Status: DC
  Administered 2015-09-23 – 2015-09-26 (×6): 5 mg via ORAL
  Filled 2015-09-23 (×8): qty 1

## 2015-09-23 MED ORDER — FENTANYL CITRATE (PF) 100 MCG/2ML IJ SOLN
75.0000 ug | Freq: Once | INTRAMUSCULAR | Status: AC
  Administered 2015-09-23: 50 ug via INTRAVENOUS

## 2015-09-23 MED ORDER — FENTANYL CITRATE (PF) 100 MCG/2ML IJ SOLN
50.0000 ug | Freq: Once | INTRAMUSCULAR | Status: DC
  Filled 2015-09-23: qty 2

## 2015-09-23 MED ORDER — ONDANSETRON HCL 4 MG/2ML IJ SOLN
4.0000 mg | Freq: Four times a day (QID) | INTRAMUSCULAR | Status: DC | PRN
  Administered 2015-09-24: 4 mg via INTRAVENOUS
  Filled 2015-09-23: qty 2

## 2015-09-23 MED ORDER — SODIUM CHLORIDE 0.9 % IV SOLN
INTRAVENOUS | Status: DC
  Administered 2015-09-23 – 2015-09-30 (×9): via INTRAVENOUS

## 2015-09-23 MED ORDER — FENTANYL CITRATE (PF) 100 MCG/2ML IJ SOLN
75.0000 ug | Freq: Once | INTRAMUSCULAR | Status: AC
  Administered 2015-09-23: 75 ug via INTRAVENOUS
  Filled 2015-09-23: qty 2

## 2015-09-23 MED ORDER — LORAZEPAM 0.5 MG PO TABS: 0.5000 mg | ORAL_TABLET | Freq: Four times a day (QID) | ORAL | Status: DC | PRN

## 2015-09-23 MED ORDER — IOPAMIDOL (ISOVUE-300) INJECTION 61%
100.0000 mL | Freq: Once | INTRAVENOUS | Status: AC | PRN
  Administered 2015-09-23: 100 mL via INTRAVENOUS

## 2015-09-23 MED ORDER — HYDROCHLOROTHIAZIDE 25 MG PO TABS
25.0000 mg | ORAL_TABLET | Freq: Every day | ORAL | Status: DC
  Administered 2015-09-23: 25 mg via ORAL
  Filled 2015-09-23: qty 1

## 2015-09-23 MED ORDER — PROMETHAZINE HCL 25 MG/ML IJ SOLN
12.5000 mg | Freq: Four times a day (QID) | INTRAMUSCULAR | Status: DC | PRN
  Administered 2015-09-23: 12.5 mg via INTRAVENOUS
  Administered 2015-09-25: 25 mg via INTRAVENOUS
  Administered 2015-09-26 (×2): 12.5 mg via INTRAVENOUS
  Administered 2015-09-27: 25 mg via INTRAVENOUS
  Filled 2015-09-23 (×5): qty 1

## 2015-09-23 MED ORDER — FENTANYL CITRATE (PF) 100 MCG/2ML IJ SOLN
75.0000 ug | INTRAMUSCULAR | Status: DC | PRN
  Administered 2015-09-23: 75 ug via INTRAVENOUS
  Filled 2015-09-23: qty 2

## 2015-09-23 MED ORDER — SODIUM CHLORIDE 0.9 % IV BOLUS (SEPSIS)
1000.0000 mL | Freq: Once | INTRAVENOUS | Status: AC
  Administered 2015-09-23: 1000 mL via INTRAVENOUS

## 2015-09-23 MED ORDER — ONDANSETRON HCL 4 MG/2ML IJ SOLN
4.0000 mg | Freq: Once | INTRAMUSCULAR | Status: AC
  Administered 2015-09-23: 4 mg via INTRAVENOUS
  Filled 2015-09-23: qty 2

## 2015-09-23 MED ORDER — PANTOPRAZOLE SODIUM 40 MG PO TBEC
40.0000 mg | DELAYED_RELEASE_TABLET | Freq: Every day | ORAL | Status: DC
  Administered 2015-09-23: 40 mg via ORAL
  Filled 2015-09-23: qty 1

## 2015-09-23 MED ORDER — POTASSIUM CHLORIDE 10 MEQ/100ML IV SOLN
10.0000 meq | INTRAVENOUS | Status: AC
  Administered 2015-09-23 – 2015-09-24 (×6): 10 meq via INTRAVENOUS
  Filled 2015-09-23 (×7): qty 100

## 2015-09-23 MED ORDER — LABETALOL HCL 100 MG PO TABS
200.0000 mg | ORAL_TABLET | Freq: Three times a day (TID) | ORAL | Status: DC
  Administered 2015-09-23 – 2015-09-26 (×8): 200 mg via ORAL
  Filled 2015-09-23 (×11): qty 2

## 2015-09-23 MED ORDER — HYDROMORPHONE HCL 1 MG/ML IJ SOLN
1.0000 mg | Freq: Once | INTRAMUSCULAR | Status: DC
Start: 2015-09-23 — End: 2015-09-23
  Filled 2015-09-23: qty 1

## 2015-09-23 NOTE — Telephone Encounter (Signed)
Notified by registration that pt here & c/o of chest pain.  VS obtained & family states that pt has no cardiac history & chest pain started @ 6:10 am.  Pt was to get IVF today.  Triage nurse notified in ED that pt is being transported via w/c with RN.  BP 147/75, P 88, T 97.7, O2 sat 98%.

## 2015-09-23 NOTE — ED Triage Notes (Signed)
Patiaent was at the [facility] in the lobby waiting to receive IV fluids because of N/V x 5 days. Patient informed the nurse that she was having mid chest pain that started at 0600 today along with dizziness and SOB. Patient was brought to the Ed by [facility] nurse for c/o CP

## 2015-09-23 NOTE — ED Notes (Signed)
Attempted to call report. Receiving nurse to call back for report. 

## 2015-09-23 NOTE — H&P (Signed)
History and Physical    [name] [id number] DOB: 1955/05/27 DOA: 09/23/2015  PCP: [facility] Patient coming from: Home/Oncology office  Chief Complaint: Nausea and vomiting  HPI: [name] is a 60 y.o. 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 She presented to the emergency department after suffering 1 day of significant worsening nausea and vomiting with associated chest pain that started this morning. Chest pain made worse with vomiting. No hematemesis. Pain does not radiate. She took an aspirin which did not help with her pain. She has been receiving radiation therapy for her metastatic disease and was told that the radiation could be causing the nausea and vomiting  ED Course: Vitals: Hypertensive. Otherwise normal vitals Labs: Hypokalemia with a potassium of 3.0. Leukocytosis with a white blood cell count of 14.5 K and a hemoglobin of 9.6 Imaging: Chest x-ray significant for left upper lobe mass and stable cardiomegaly. CT chest and abdomen significant for mild pericardial effusion, decrease in some pulmonary metastatic lesions Medications/Course: Patient given a GI cocktail which did not help with pain. She is given fentanyl which helped with her pain mildly.  Review of Systems: Review of Systems  Respiratory: Negative for cough, hemoptysis, sputum production, shortness of breath and wheezing.   Cardiovascular: Positive for chest pain. Negative for palpitations and leg swelling.  Gastrointestinal: Positive for nausea and vomiting. Negative for abdominal pain, blood in stool, constipation, diarrhea and melena.  All other systems reviewed and are negative.   Past Medical History:  Diagnosis Date  . Chemotherapy-induced neuropathy ([place])   . DVT, lower extremity ([place])    left  02-12-2015  currently on Eliqius  .  Endometrial cancer [facility]) oncologist-  dr Marko Plume   02-2015  . 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 Family history of breast cancer   . History of adenomatous polyp of colon    tubulovillious adenoma 09/ 2010  . History of ovarian cyst    complex  . History of radiation therapy 10/21/08-11/23/08 & 12/07/10/13/12/23 2010   ENDOMETRIOID   . History of uterine fibroid   . Hypertension   . Inguinal fluid collection 02/2015   right-drained x2  . Iron deficiency anemia    chronic severe  . Metastasis to lung ([place])   . Metastasis to lymph nodes [facility])     Past Surgical History:  Procedure Laterality Date  . ABDOMINAL HYSTERECTOMY  2010  . APPLICATION OF WOUND VAC Right 05/12/2015   Procedure: APPLICATION OF WOUND VAC;  Surgeon: Hall Busing, MD;  Location: [facility];  Service: General;  Laterality: Right;  . COLONOSCOPY W/ POLYPECTOMY  09-27-2008  . EXPLORATORY LAPAROTOMY /  TOTAL ABDOMINAL HYSTERECTOMY/ BILATERAL SALPINOOPHORECTOMY/  PARTIAL OMENTECTOMY  03-18-2008  . INGUINAL HERNIA REPAIR Right 05/12/2015   Procedure: INCISION AND DRAINGE RIGHT INGUINAL FLUID COLLECTION ;  Surgeon: Hall Busing, MD;  Location: [facility];  Service: General;  Laterality: Right;  . IR GENERIC HISTORICAL  08/10/2015   IR US GUIDE VASC ACCESS RIGHT 08/10/2015 Jacqulynn Cadet, MD [facility]-INTERV RAD  . IR GENERIC HISTORICAL  08/10/2015   IR FLUORO GUIDE CV LINE RIGHT 08/10/2015 Jacqulynn Cadet, MD [facility]-INTER[facility] RAD  . porta cath  06/2010   removal  . TUBAL LIGATION  1990's  . UMBILICAL HERNIA REPAIR     infant     reports that she  has never smoked. She has never used smokeless tobacco. She reports that she does not drink alcohol or use drugs.  Allergies  Allergen Reactions  . Morphine And Related Itching and Other (See Comments)    Pruritis with morphine - depends on strength of medication  . Dilaudid [Hydromorphone Hcl] Itching and Nausea And Vomiting    Family  History  Problem Relation Age of Onset  . Bone cancer Paternal Grandmother     OSSEOUS METASTASIS  . Heart disease Father   . Heart disease Sister   . Bone cancer Maternal Grandmother 75  . Leukemia Maternal Grandfather 31  . Liver cancer Maternal Uncle   . Breast cancer Cousin 73    maternal first cousin    Prior to Admission medications   Medication Sig Start Date End Date Taking? Authorizing Provider  apixaban (ELIQUIS) 5 MG TABS tablet Take 1 tablet (5 mg total) by mouth 2 (two) times daily. 03/03/15  Yes Lennis Marion Downer, MD  benzonatate (TESSALON) 100 MG capsule Take 1 capsule (100 mg total) by mouth 3 (three) times daily as needed for cough. 08/22/15  Yes Lennis Marion Downer, MD  cloNIDine (CATAPRES) 0.1 MG tablet Take 0.1 mg by mouth daily.  02/25/15  Yes Historical Provider, MD  hydrochlorothiazide (HYDRODIURIL) 25 MG tablet Take 25 mg by mouth daily.   11/01/08  Yes Historical Provider, MD  labetalol (NORMODYNE) 100 MG tablet Take 2 tablets (200 mg total) by mouth 3 (three) times daily. 08/25/15  Yes Lennis Marion Downer, MD  letrozole (FEMARA) 2.5 MG tablet Take 1 tablet (2.5 mg total) by mouth daily. 08/18/15  Yes Lennis Marion Downer, MD  lidocaine-prilocaine (EMLA) cream Apply to Porta-cath 1-2 hrs prior to access as directed. Patient taking differently: Apply 1 application topically as needed (Apply to port-a-cath 1-2 hours prior to access.).  08/04/15  Yes Lennis Marion Downer, MD  LORazepam (ATIVAN) 0.5 MG tablet Place 1 tablet under the tongue or swallow every 6 hrs as needed for nausea. 09/06/15  Yes Gery Pray, MD  morphine (MSIR) 15 MG tablet Take 1 tablet (15 mg total) by mouth every 6 (six) hours as needed for moderate pain or severe pain. 08/04/15  Yes Lennis Marion Downer, MD  ondansetron (ZOFRAN ODT) 8 MG disintegrating tablet Take 1 tablet (8 mg total) by mouth every 8 (eight) hours as needed for nausea or vomiting. 09/12/15  Yes Tatyana Kirichenko, PA-C  pantoprazole (PROTONIX) 40 MG tablet  Take 1 tablet (40 mg total) by mouth daily. 03/14/15  Yes Lennis Marion Downer, MD  Pediatric Multivit-Minerals-C (FLINTSTONES GUMMIES PO) Take 1 tablet by mouth daily.   Yes Historical Provider, MD  polyethylene glycol (MIRALAX / GLYCOLAX) packet Take 17 g by mouth 2 (two) times daily as needed for mild constipation. Reported on 05/23/2015   Yes Historical Provider, MD  potassium chloride (K-DUR) 10 MEQ tablet Take 2 tablets today and then 1 tablet daily 05/23/15  Yes Lennis Marion Downer, MD  promethazine (PHENERGAN) 25 MG tablet Take 1 tablet (25 mg total) by mouth every 6 (six) hours as needed for nausea or vomiting. 09/15/15  Yes Gery Pray, MD  sucralfate (CARAFATE) 1 GM/10ML suspension Take 10 mLs (1 g total) by mouth 4 (four) times daily -  with meals and at bedtime. 09/14/15  Yes Gery Pray, MD  Wound Dressings (SONAFINE EX) Apply 1 application topically as needed (For wound dressing.).    Yes Historical Provider, MD  chlorpheniramine-HYDROcodone (TUSSIONEX) 10-8 MG/5ML SUER Take 5 ml  by mouth every 8-12 hours as needed for cough. Patient not taking: Reported on 09/23/2015 08/15/15   Gery Pray, MD  dexamethasone (DECADRON) 4 MG tablet Take 2 tablets (8 mg total) by mouth 2 (two) times daily. For 3 days.  Start the day before Taxotere. Patient not taking: Reported on 09/23/2015 06/09/15   Gordy Levan, MD  ferrous sulfate 325 (65 FE) MG tablet Take 1 tablet (325 mg total) by mouth 2 (two) times daily with a meal. Patient not taking: Reported on 09/23/2015 02/17/15   Robbie Lis, MD  HYDROmorphone (DILAUDID) 2 MG tablet Take 1-2 tablets (2-4 mg total) by mouth every 4 (four) hours as needed for severe pain. Patient not taking: Reported on 09/23/2015 08/18/15   Gordy Levan, MD  zolpidem (AMBIEN) 5 MG tablet Take 1 tablet (5 mg total) by mouth at bedtime as needed for sleep. Patient not taking: Reported on 09/23/2015 09/02/15   Gordy Levan, MD    Physical Exam: Vitals:   09/23/15 1330  09/23/15 1400 09/23/15 1401 09/23/15 1555  BP: 162/87 165/93 162/87 (!) 184/86  Pulse: 88 89 91 96  Resp: 12 22 15 14   Temp:    99 F (37.2 C)  TempSrc:    Oral  SpO2: 96% 93% 93% 96%  Weight:    70.2 kg (154 lb 12.2 oz)  Height:    5\' 3"  (1.6 m)     Constitutional: NAD, calm, comfortable Eyes: PERRL, lids and conjunctivae normal ENMT: Mucous membranes are moist. Posterior pharynx clear of any exudate or lesions.Normal dentition.  Neck: normal, supple, no masses, no thyromegaly Respiratory: clear to auscultation bilaterally, no wheezing, no crackles. Normal respiratory effort. No accessory muscle use.  Cardiovascular: Regular rate and rhythm, no murmurs / rubs / gallops. No extremity edema. 2+ pedal pulses. No carotid bruits.  Abdomen: no tenderness, no masses palpated. No hepatosplenomegaly. Bowel sounds positive.  Musculoskeletal: no clubbing / cyanosis. No joint deformity upper and lower extremities. Good ROM, no contractures. Normal muscle tone.  Skin: no rashes, lesions, ulcers. No induration Neurologic: CN 2-12 grossly intact. Sensation intact, DTR normal. Strength 5/5 in all 4.  Psychiatric: Normal judgment and insight. Alert and oriented x 3. Normal mood.   Labs on Admission: I have personally reviewed following labs and imaging studies  CBC:  Recent Labs Lab 09/23/15 0858  WBC 14.5*  HGB 9.6*  HCT 30.6*  MCV 76.9*  PLT [id number]   Basic Metabolic Panel:  Recent Labs Lab 09/23/15 0858  NA 139  K 3.0*  CL 102  CO2 25  GLUCOSE 170*  BUN 10  CREATININE 0.93  CALCIUM 9.2   GFR: Estimated Creatinine Clearance: 58.9 mL/min (by C-G formula based on SCr of 0.93 mg/dL). Liver Function Tests:  Recent Labs Lab 09/23/15 0858  AST 11*  ALT 8*  ALKPHOS 81  BILITOT 0.6  PROT 7.8  ALBUMIN 3.5    Recent Labs Lab 09/23/15 0858  LIPASE 12   No results for input(s): AMMONIA in the last 168 hours. Coagulation Profile: No results for input(s): INR, PROTIME in the  last 168 hours. Cardiac Enzymes: No results for input(s): CKTOTAL, CKMB, CKMBINDEX, TROPONINI in the last 168 hours. BNP (last 3 results) No results for input(s): PROBNP in the last 8760 hours. HbA1C: No results for input(s): HGBA1C in the last 72 hours. CBG: No results for input(s): GLUCAP in the last 168 hours. Lipid Profile: No results for input(s): CHOL, HDL, LDLCALC, TRIG, CHOLHDL, LDLDIRECT in  the last 72 hours. Thyroid Function Tests: No results for input(s): TSH, T4TOTAL, FREET4, T3FREE, THYROIDAB in the last 72 hours. Anemia Panel: No results for input(s): VITAMINB12, FOLATE, FERRITIN, TIBC, IRON, RETICCTPCT in the last 72 hours. Urine analysis:    Component Value Date/Time   COLORURINE YELLOW 09/12/2015 1400   APPEARANCEUR CLEAR 09/12/2015 1400   LABSPEC 1.010 09/12/2015 1400   PHURINE 7.0 09/12/2015 1400   GLUCOSEU NEGATIVE 09/12/2015 1400   HGBUR NEGATIVE 09/12/2015 1400   BILIRUBINUR NEGATIVE 09/12/2015 1400   KETONESUR 40 (A) 09/12/2015 1400   PROTEINUR NEGATIVE 09/12/2015 1400   UROBILINOGEN 0.2 03/03/2008 1216   NITRITE NEGATIVE 09/12/2015 1400   LEUKOCYTESUR TRACE (A) 09/12/2015 1400   Sepsis Labs: !!!!!!!!!!!!!!!!!!!!!!!!!!!!!!!!!!!!!!!!!!!! @LABRCNTIP (procalcitonin:4,lacticidven:4) )No results found for this or any previous visit (from the past 240 hour(s)).   Radiological Exams on Admission: Dg Chest 2 View  Result Date: 09/23/2015 CLINICAL DATA:  Mid chest pain hand shortness of breath since 6 a.m. today. EXAM: CHEST  2 VIEW COMPARISON:  09/12/2015 FINDINGS: Left upper lobe mass again noted not significantly changed. Right Port-A-Cath is unchanged. Mild cardiomegaly. No confluent opacity on the right. No effusions or acute bony abnormality. IMPRESSION: Stable left upper lobe mass. Mild cardiomegaly. Electronically Signed   By: Rolm Baptise M.D.   On: 09/23/2015 10:56   Ct Chest W Contrast  Result Date: 09/23/2015 CLINICAL DATA:  History of endometrial  carcinoma with metastatic disease and chest pain EXAM: CT CHEST, ABDOMEN, AND PELVIS WITH CONTRAST TECHNIQUE: Multidetector CT imaging of the chest, abdomen and pelvis was performed following the standard protocol during bolus administration of intravenous contrast. CONTRAST:  124mL ISOVUE-300 IOPAMIDOL (ISOVUE-300) INJECTION 61% COMPARISON:  06/14/2015, 08/01/2015 FINDINGS: CT CHEST FINDINGS Cardiovascular: Bovine arch anomaly is noted. The thoracic aorta show some calcific changes without evidence of dissection or aneurysmal dilatation. The pulmonary artery as visualized is within normal limits. Timing was not optimum for pulmonary artery evaluation. Mild pericardial effusion is noted new from the prior exam. Mediastinum/Nodes: The thoracic inlet is within normal limits. There is again noted a 80 somewhat necrotic mass which abuts the mediastinum on the left. It measures approximately 7.2 x 6.6 cm in greatest transverse and AP dimensions respectively. This is a slight decrease when compared with the prior exam. Some mediastinal adenopathy is noted in the aorticopulmonary window measuring 3.0 cm in short axis. This also demonstrates central decreased attenuation consistent with necrosis. Lungs/Pleura: The lungs are well aerated bilaterally and again demonstrate a large left upper lobe mass lesion which abuts the major fissure. It measures approximately 5.0 by 4.0 cm in greatest dimension. This is also decreased in size from prior exam measuring 5.5 x 5.1 cm. Left lower lobe nodule with spiculation is noted and also reduced in size now measuring approximately 19 mm in greatest dimension decreased from the prior exam measuring 26 mm in greatest dimension. The right lung is well aerated and demonstrates some dependent atelectatic changes. A tiny nodule is noted in the lateral aspect of the right upper lobe also decreased in size from the prior PET-CT. A second right lower lobe nodule is seen measuring approximately 9  mm. It demonstrates some tenting of the adjacent major fissure. It is slightly decreased in size from the prior exam at which time it measured 11 mm. Small left pleural effusion is noted. Musculoskeletal: No acute bony abnormality is noted. CT ABDOMEN PELVIS FINDINGS Hepatobiliary: Liver demonstrates some hypodensities adjacent to the falciform ligament consistent with focal fatty infiltration. The  gallbladder is well distended. Pancreas: Within normal limits. Spleen: Within normal limits. Adrenals/Urinary Tract: The adrenal glands are unremarkable. The kidneys demonstrate renal cystic change on the right as well as a nonobstructing renal stone stable from the prior exam. The bladder is well distended without focal abnormality. Stomach/Bowel: Stomach is within normal limits. No evidence of bowel wall thickening, distention, or inflammatory changes. Appendix appears normal. Vascular/Lymphatic: Aortic atherosclerosis. The previously seen left pelvic lymphadenopathy in the iliac chain has increased in size from the prior exam measuring 3.7 x 2.6 cm. It previously measured 3 x 2 cm. Persistent soft tissue changes are noted in the right inguinal region but slightly decreased when compared with the prior exam. No central necrotic component is noted. Stable left psoas mass is noted. Stable left periaortic lymph node is noted as well. Reproductive: Status post hysterectomy. No adnexal masses. Musculoskeletal: No acute bony abnormality is noted. IMPRESSION: Decrease in some of the pulmonary metastatic lesions as described above. The mass lesion abutting the mediastinum on the left has decreased in size from the prior exam. New mild pericardial effusion. Increase in size in a pelvic lymphadenopathy in the iliac chain. The left psoas mass and periaortic lymph node as well as changes in the right inguinal region are relatively stable as described. No new focal abnormality is seen. Electronically Signed   By: Inez Catalina M.D.    On: 09/23/2015 11:56   Ct Abdomen Pelvis W Contrast  Result Date: 09/23/2015 CLINICAL DATA:  History of endometrial carcinoma with metastatic disease and chest pain EXAM: CT CHEST, ABDOMEN, AND PELVIS WITH CONTRAST TECHNIQUE: Multidetector CT imaging of the chest, abdomen and pelvis was performed following the standard protocol during bolus administration of intravenous contrast. CONTRAST:  131mL ISOVUE-300 IOPAMIDOL (ISOVUE-300) INJECTION 61% COMPARISON:  06/14/2015, 08/01/2015 FINDINGS: CT CHEST FINDINGS Cardiovascular: Bovine arch anomaly is noted. The thoracic aorta show some calcific changes without evidence of dissection or aneurysmal dilatation. The pulmonary artery as visualized is within normal limits. Timing was not optimum for pulmonary artery evaluation. Mild pericardial effusion is noted new from the prior exam. Mediastinum/Nodes: The thoracic inlet is within normal limits. There is again noted a 80 somewhat necrotic mass which abuts the mediastinum on the left. It measures approximately 7.2 x 6.6 cm in greatest transverse and AP dimensions respectively. This is a slight decrease when compared with the prior exam. Some mediastinal adenopathy is noted in the aorticopulmonary window measuring 3.0 cm in short axis. This also demonstrates central decreased attenuation consistent with necrosis. Lungs/Pleura: The lungs are well aerated bilaterally and again demonstrate a large left upper lobe mass lesion which abuts the major fissure. It measures approximately 5.0 by 4.0 cm in greatest dimension. This is also decreased in size from prior exam measuring 5.5 x 5.1 cm. Left lower lobe nodule with spiculation is noted and also reduced in size now measuring approximately 19 mm in greatest dimension decreased from the prior exam measuring 26 mm in greatest dimension. The right lung is well aerated and demonstrates some dependent atelectatic changes. A tiny nodule is noted in the lateral aspect of the right  upper lobe also decreased in size from the prior PET-CT. A second right lower lobe nodule is seen measuring approximately 9 mm. It demonstrates some tenting of the adjacent major fissure. It is slightly decreased in size from the prior exam at which time it measured 11 mm. Small left pleural effusion is noted. Musculoskeletal: No acute bony abnormality is noted. CT ABDOMEN PELVIS FINDINGS  Hepatobiliary: Liver demonstrates some hypodensities adjacent to the falciform ligament consistent with focal fatty infiltration. The gallbladder is well distended. Pancreas: Within normal limits. Spleen: Within normal limits. Adrenals/Urinary Tract: The adrenal glands are unremarkable. The kidneys demonstrate renal cystic change on the right as well as a nonobstructing renal stone stable from the prior exam. The bladder is well distended without focal abnormality. Stomach/Bowel: Stomach is within normal limits. No evidence of bowel wall thickening, distention, or inflammatory changes. Appendix appears normal. Vascular/Lymphatic: Aortic atherosclerosis. The previously seen left pelvic lymphadenopathy in the iliac chain has increased in size from the prior exam measuring 3.7 x 2.6 cm. It previously measured 3 x 2 cm. Persistent soft tissue changes are noted in the right inguinal region but slightly decreased when compared with the prior exam. No central necrotic component is noted. Stable left psoas mass is noted. Stable left periaortic lymph node is noted as well. Reproductive: Status post hysterectomy. No adnexal masses. Musculoskeletal: No acute bony abnormality is noted. IMPRESSION: Decrease in some of the pulmonary metastatic lesions as described above. The mass lesion abutting the mediastinum on the left has decreased in size from the prior exam. New mild pericardial effusion. Increase in size in a pelvic lymphadenopathy in the iliac chain. The left psoas mass and periaortic lymph node as well as changes in the right inguinal  region are relatively stable as described. No new focal abnormality is seen. Electronically Signed   By: Inez Catalina M.D.   On: 09/23/2015 11:56    EKG: Independently reviewed. Right bundle branch block, left anterior fascicular block, prolonged QTc  Assessment/Plan Principal Problem:   Nausea & vomiting Active Problems:   Essential hypertension   Endometrial cancer (HCC)   Anemia of chronic disease   Pericardial effusion   Chest pain  Nausea vomiting Likely secondary to adhesion therapy. CT scan unremarkable. Doubt Boerhaave disease. Pericardial effusion seen on CT scan on chart review, pericardial effusion also seen on echocardiogram in July 2017. Low suspicion that this is the cause of her pain. -Phenergan and Zofran to control nausea -Morphine when necessary for pain  Chest pain Possibly secondary to gastritis from radiation therapy. Pericardial effusion seen on CT scan that is present from recent echocardiogram. Will treat as above for now.  Anemia of chronic disease Stable. Patient gets [facility] outpatient  Pericardial effusion I doubt pericarditis although patient has had recent radiation. Could not hear friction rub. May be worth considering starting an NSAID, however may be detrimental if this is more related to gastritis  History of DVT -Continue Eliquis  DVT prophylaxis: Eliquis Code Status: Full code Family Communication: Discussed with husband and daughter at bedside Disposition Plan: Likely discharge home in 2-3 days Consults called: None Admission status: Inpatient, medical floor   Cordelia Poche, MD [facility] Pager [phone]  If 7PM-7AM, please contact night-coverage [email or web address] Password [facility]  09/23/2015, 5:17 PM

## 2015-09-23 NOTE — ED Provider Notes (Signed)
[facility] DEPT Provider Note   CSN: [id number] Arrival date & time: 09/23/15  [id number]     History   Chief Complaint Chief Complaint  Patient presents with  . Chest Pain  . Emesis    HPI [name] is a 60 y.o. female.  HPI 60 year old female with past medical history of metastatic endometrial cancer as well as DVT who presents with chest pain and emesis. The patient has had a history of recurrent emesis and starting her radiation therapy to her lungs for her. No metastases. She was scheduled to receive IV fluids by her oncologist today, but states that this morning after vomiting she developed acute onset of burning chest pain. The pain is burning, constant but worse with swallowing, as well as deep inspiration. Denies any blood in her emesis. She has never had pain like this before with her vomiting. She's been unable tolerate any by mouth intake over the last several days due to this nausea. When she told the nurses at the [facility] today, they advised her to present to the ED for evaluation. Denies any history of coronary artery disease.  Past Medical History:  Diagnosis Date  . Chemotherapy-induced neuropathy ([place])   . DVT, lower extremity ([place])    left  02-12-2015  currently on Eliqius  . Endometrial cancer [facility]) oncologist-  dr Marko Plume   02-2015  . 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 Family history of breast cancer   . History of adenomatous polyp of colon    tubulovillious adenoma 09/ 2010  . History of ovarian cyst    complex  . History of radiation therapy 10/21/08-11/23/08 & 12/07/10/13/12/23 2010   ENDOMETRIOID   . History of uterine fibroid   . Hypertension   . Inguinal fluid collection 02/2015   right-drained x2  . Iron deficiency anemia    chronic severe  . Metastasis to lung ([place])   . 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 Nausea & vomiting 09/23/2015  . Pericardial effusion  09/23/2015  . Chest pain 09/23/2015  . DNR (do not resuscitate) 09/06/2015  . Malignant neoplasm metastatic to lung ([place]) 08/20/2015  . 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 Metastatic cancer to intra-abdominal lymph nodes ([place]) 08/20/2015  . Genetic testing 07/07/2015  . Anemia due to antineoplastic chemotherapy 06/11/2015  . Adenomatous colon polyp 05/10/2015  . Drug reaction 05/10/2015  . Mass of right inguinal region 05/10/2015  . Family history of breast cancer   . Inguinal lymphocyst 04/22/2015  . Chemotherapy induced nausea and vomiting 03/05/2015  . DVT (deep venous thrombosis), left 03/05/2015  . Poor venous access 02/22/2015  . Constipation due to pain medication 02/22/2015  . Noncompliance with medication regimen 02/22/2015  . Chronic anticoagulation 02/22/2015  . Leg DVT (deep venous thromboembolism), acute ([place]) 02/22/2015  . Chemotherapy-induced neuropathy ([place]) 02/22/2015  . Cancer associated pain 02/22/2015  . Iron deficiency anemia due to chronic blood loss 02/22/2015  . International Federation of Gynecology and Obstetrics (FIGO) stage IVB epithelial ovarian cancer ([place]) 02/22/2015  . Need for prophylactic vaccination and inoculation against influenza 02/22/2015  . Anemia of chronic disease 02/14/2015  . Anemia, iron deficiency 02/14/2015  . Uterine cancer ([place]) 02/12/2015  . Hypokalemia 02/12/2015  . Endometrial cancer ([place]) 11/20/2010  . Essential hypertension 09/23/2008  . NONSPECIFIC ABN FINDING RAD & OTH EXAM GI TRACT 09/23/2008    Past  Surgical History:  Procedure Laterality Date  . ABDOMINAL HYSTERECTOMY  2010  . APPLICATION OF WOUND VAC Right 05/12/2015   Procedure: APPLICATION OF WOUND VAC;  Surgeon: Hall Busing, MD;  Location: [facility];  Service: General;  Laterality: Right;  . COLONOSCOPY W/ POLYPECTOMY  09-27-2008  . EXPLORATORY LAPAROTOMY /  TOTAL ABDOMINAL HYSTERECTOMY/ BILATERAL  SALPINOOPHORECTOMY/  PARTIAL OMENTECTOMY  03-18-2008  . INGUINAL HERNIA REPAIR Right 05/12/2015   Procedure: INCISION AND DRAINGE RIGHT INGUINAL FLUID COLLECTION ;  Surgeon: Hall Busing, MD;  Location: [facility];  Service: General;  Laterality: Right;  . IR GENERIC HISTORICAL  08/10/2015   IR US GUIDE VASC ACCESS RIGHT 08/10/2015 Jacqulynn Cadet, MD [facility]-INTER[facility] RAD  . IR GENERIC HISTORICAL  08/10/2015   IR FLUORO GUIDE CV LINE RIGHT 08/10/2015 Jacqulynn Cadet, MD [facility]INTER[facility] RAD  . porta cath  06/2010   removal  . TUBAL LIGATION  1990's  . UMBILICAL HERNIA REPAIR     infant    OB History    No data available       Home Medications    Prior to Admission medications   Medication Sig Start Date End Date Taking? Authorizing Provider  apixaban (ELIQUIS) 5 MG TABS tablet Take 1 tablet (5 mg total) by mouth 2 (two) times daily. 03/03/15  Yes Lennis Marion Downer, MD  benzonatate (TESSALON) 100 MG capsule Take 1 capsule (100 mg total) by mouth 3 (three) times daily as needed for cough. 08/22/15  Yes Lennis Marion Downer, MD  cloNIDine (CATAPRES) 0.1 MG tablet Take 0.1 mg by mouth daily.  02/25/15  Yes Historical Provider, MD  hydrochlorothiazide (HYDRODIURIL) 25 MG tablet Take 25 mg by mouth daily.   11/01/08  Yes Historical Provider, MD  labetalol (NORMODYNE) 100 MG tablet Take 2 tablets (200 mg total) by mouth 3 (three) times daily. 08/25/15  Yes Lennis Marion Downer, MD  letrozole (FEMARA) 2.5 MG tablet Take 1 tablet (2.5 mg total) by mouth daily. 08/18/15  Yes Lennis Marion Downer, MD  lidocaine-prilocaine (EMLA) cream Apply to Porta-cath 1-2 hrs prior to access as directed. Patient taking differently: Apply 1 application topically as needed (Apply to port-a-cath 1-2 hours prior to access.).  08/04/15  Yes Lennis Marion Downer, MD  LORazepam (ATIVAN) 0.5 MG tablet Place 1 tablet under the tongue or swallow every 6 hrs as needed for nausea. 09/06/15  Yes Gery Pray, MD  morphine (MSIR) 15 MG tablet Take 1  tablet (15 mg total) by mouth every 6 (six) hours as needed for moderate pain or severe pain. 08/04/15  Yes Lennis Marion Downer, MD  ondansetron (ZOFRAN ODT) 8 MG disintegrating tablet Take 1 tablet (8 mg total) by mouth every 8 (eight) hours as needed for nausea or vomiting. 09/12/15  Yes Tatyana Kirichenko, PA-C  pantoprazole (PROTONIX) 40 MG tablet Take 1 tablet (40 mg total) by mouth daily. 03/14/15  Yes Lennis Marion Downer, MD  Pediatric Multivit-Minerals-C (FLINTSTONES GUMMIES PO) Take 1 tablet by mouth daily.   Yes Historical Provider, MD  polyethylene glycol (MIRALAX / GLYCOLAX) packet Take 17 g by mouth 2 (two) times daily as needed for mild constipation. Reported on 05/23/2015   Yes Historical Provider, MD  potassium chloride (K-DUR) 10 MEQ tablet Take 2 tablets today and then 1 tablet daily 05/23/15  Yes Lennis Marion Downer, MD  promethazine (PHENERGAN) 25 MG tablet Take 1 tablet (25 mg total) by mouth every 6 (six) hours as needed for nausea or vomiting. 09/15/15  Yes Gery Pray, MD  sucralfate (CARAFATE) 1 GM/10ML suspension Take 10 mLs (1 g total) by mouth 4 (four) times daily -  with meals and at bedtime. 09/14/15  Yes Gery Pray, MD  Wound Dressings (SONAFINE EX) Apply 1 application topically as needed (For wound dressing.).    Yes Historical Provider, MD  chlorpheniramine-HYDROcodone (TUSSIONEX) 10-8 MG/5ML SUER Take 5 ml by mouth every 8-12 hours as needed for cough. Patient not taking: Reported on 09/23/2015 08/15/15   Gery Pray, MD  dexamethasone (DECADRON) 4 MG tablet Take 2 tablets (8 mg total) by mouth 2 (two) times daily. For 3 days.  Start the day before Taxotere. Patient not taking: Reported on 09/23/2015 06/09/15   Gordy Levan, MD  ferrous sulfate 325 (65 FE) MG tablet Take 1 tablet (325 mg total) by mouth 2 (two) times daily with a meal. Patient not taking: Reported on 09/23/2015 02/17/15   Robbie Lis, MD  HYDROmorphone (DILAUDID) 2 MG tablet Take 1-2 tablets (2-4 mg total) by mouth  every 4 (four) hours as needed for severe pain. Patient not taking: Reported on 09/23/2015 08/18/15   Gordy Levan, MD  zolpidem (AMBIEN) 5 MG tablet Take 1 tablet (5 mg total) by mouth at bedtime as needed for sleep. Patient not taking: Reported on 09/23/2015 09/02/15   Gordy Levan, MD    Family History Family History  Problem Relation Age of Onset  . Bone cancer Paternal Grandmother     OSSEOUS METASTASIS  . Heart disease Father   . Heart disease Sister   . Bone cancer Maternal Grandmother 75  . Leukemia Maternal Grandfather 58  . Liver cancer Maternal Uncle   . Breast cancer Cousin 28    maternal first cousin    Social History Social History  Substance Use Topics  . Smoking status: Never Smoker  . Smokeless tobacco: Never Used  . Alcohol use No     Allergies   Morphine and related and Dilaudid [hydromorphone hcl]   Review of Systems Review of Systems  Constitutional: Positive for fatigue. Negative for chills and fever.  HENT: Negative for congestion and rhinorrhea.   Eyes: Negative for visual disturbance.  Respiratory: Positive for chest tightness. Negative for cough, shortness of breath and wheezing.   Cardiovascular: Positive for chest pain. Negative for leg swelling.  Gastrointestinal: Positive for abdominal pain, nausea and vomiting. Negative for diarrhea.  Genitourinary: Negative for dysuria and flank pain.  Musculoskeletal: Negative for neck pain and neck stiffness.  Skin: Negative for rash and wound.  Allergic/Immunologic: Negative for immunocompromised state.  Neurological: Negative for syncope, weakness and headaches.  All other systems reviewed and are negative.    Physical Exam Updated Vital Signs BP 127/70 (BP Location: Right Arm)   Pulse 84   Temp 98.8 F (37.1 C) (Oral)   Resp 18   Ht 5\' 3"  (1.6 m)   Wt 154 lb 12.2 oz (70.2 kg)   SpO2 98%   BMI 27.42 kg/m   Physical Exam  Constitutional: She is oriented to person, place, and time.  She appears well-developed and well-nourished. No distress.  HENT:  Head: Normocephalic and atraumatic.  Mouth/Throat: No oropharyngeal exudate.  Eyes: Conjunctivae are normal.  Neck: Neck supple.  Cardiovascular: Normal rate, regular rhythm and normal heart sounds.  Exam reveals no friction rub.   No murmur heard. Pulmonary/Chest: Effort normal and breath sounds normal. No respiratory distress. She has no wheezes. She has no rales.  Abdominal: Soft. Bowel sounds  are normal. She exhibits no distension. There is tenderness. There is guarding.  Musculoskeletal: She exhibits no edema.  Neurological: She is alert and oriented to person, place, and time. She exhibits normal muscle tone.  Skin: Skin is warm. Capillary refill takes less than 2 seconds.  Psychiatric: She has a normal mood and affect.  Nursing note and vitals reviewed.    ED Treatments / Results  Labs (all labs ordered are listed, but only abnormal results are displayed) Labs Reviewed  CBC - Abnormal; Notable for the following:       Result Value   WBC 14.5 (*)    Hemoglobin 9.6 (*)    HCT 30.6 (*)    MCV 76.9 (*)    MCH 24.1 (*)    RDW 19.0 (*)    All other components within normal limits  COMPREHENSIVE METABOLIC PANEL - Abnormal; Notable for the following:    Potassium 3.0 (*)    Glucose, Bld 170 (*)    AST 11 (*)    ALT 8 (*)    All other components within normal limits  BASIC METABOLIC PANEL - Abnormal; Notable for the following:    Glucose, Bld 119 (*)    Calcium 7.9 (*)    All other components within normal limits  CBC - Abnormal; Notable for the following:    WBC 13.2 (*)    RBC 3.43 (*)    Hemoglobin 8.2 (*)    HCT 25.9 (*)    MCV 75.5 (*)    MCH 23.9 (*)    RDW 19.3 (*)    All other components within normal limits  LIPASE, BLOOD  I-STAT TROPOININ, ED  I-STAT CG4 LACTIC ACID, ED  I-STAT CG4 LACTIC ACID, ED    EKG  EKG Interpretation  Date/Time:  Friday September 23 2015 08:43:25  EDT Ventricular Rate:  96 PR Interval:    QRS Duration: 144 QT Interval:  385 QTC Calculation: 487 R Axis:   -71 Text Interpretation:  Sinus rhythm RBBB and LAFB Confirmed by Christy Gentles  MD, DONALD ([id number]) on 09/24/2015 8:23:52 AM       Radiology Dg Chest 2 View  Result Date: 09/23/2015 CLINICAL DATA:  Mid chest pain hand shortness of breath since 6 a.m. today. EXAM: CHEST  2 VIEW COMPARISON:  09/12/2015 FINDINGS: Left upper lobe mass again noted not significantly changed. Right Port-A-Cath is unchanged. Mild cardiomegaly. No confluent opacity on the right. No effusions or acute bony abnormality. IMPRESSION: Stable left upper lobe mass. Mild cardiomegaly. Electronically Signed   By: Rolm Baptise M.D.   On: 09/23/2015 10:56   Ct Chest W Contrast  Result Date: 09/23/2015 CLINICAL DATA:  History of endometrial carcinoma with metastatic disease and chest pain EXAM: CT CHEST, ABDOMEN, AND PELVIS WITH CONTRAST TECHNIQUE: Multidetector CT imaging of the chest, abdomen and pelvis was performed following the standard protocol during bolus administration of intravenous contrast. CONTRAST:  142mL ISOVUE-300 IOPAMIDOL (ISOVUE-300) INJECTION 61% COMPARISON:  06/14/2015, 08/01/2015 FINDINGS: CT CHEST FINDINGS Cardiovascular: Bovine arch anomaly is noted. The thoracic aorta show some calcific changes without evidence of dissection or aneurysmal dilatation. The pulmonary artery as visualized is within normal limits. Timing was not optimum for pulmonary artery evaluation. Mild pericardial effusion is noted new from the prior exam. Mediastinum/Nodes: The thoracic inlet is within normal limits. There is again noted a 80 somewhat necrotic mass which abuts the mediastinum on the left. It measures approximately 7.2 x 6.6 cm in greatest transverse and AP dimensions respectively. This  is a slight decrease when compared with the prior exam. Some mediastinal adenopathy is noted in the aorticopulmonary window measuring 3.0 cm  in short axis. This also demonstrates central decreased attenuation consistent with necrosis. Lungs/Pleura: The lungs are well aerated bilaterally and again demonstrate a large left upper lobe mass lesion which abuts the major fissure. It measures approximately 5.0 by 4.0 cm in greatest dimension. This is also decreased in size from prior exam measuring 5.5 x 5.1 cm. Left lower lobe nodule with spiculation is noted and also reduced in size now measuring approximately 19 mm in greatest dimension decreased from the prior exam measuring 26 mm in greatest dimension. The right lung is well aerated and demonstrates some dependent atelectatic changes. A tiny nodule is noted in the lateral aspect of the right upper lobe also decreased in size from the prior PET-CT. A second right lower lobe nodule is seen measuring approximately 9 mm. It demonstrates some tenting of the adjacent major fissure. It is slightly decreased in size from the prior exam at which time it measured 11 mm. Small left pleural effusion is noted. Musculoskeletal: No acute bony abnormality is noted. CT ABDOMEN PELVIS FINDINGS Hepatobiliary: Liver demonstrates some hypodensities adjacent to the falciform ligament consistent with focal fatty infiltration. The gallbladder is well distended. Pancreas: Within normal limits. Spleen: Within normal limits. Adrenals/Urinary Tract: The adrenal glands are unremarkable. The kidneys demonstrate renal cystic change on the right as well as a nonobstructing renal stone stable from the prior exam. The bladder is well distended without focal abnormality. Stomach/Bowel: Stomach is within normal limits. No evidence of bowel wall thickening, distention, or inflammatory changes. Appendix appears normal. Vascular/Lymphatic: Aortic atherosclerosis. The previously seen left pelvic lymphadenopathy in the iliac chain has increased in size from the prior exam measuring 3.7 x 2.6 cm. It previously measured 3 x 2 cm. Persistent soft  tissue changes are noted in the right inguinal region but slightly decreased when compared with the prior exam. No central necrotic component is noted. Stable left psoas mass is noted. Stable left periaortic lymph node is noted as well. Reproductive: Status post hysterectomy. No adnexal masses. Musculoskeletal: No acute bony abnormality is noted. IMPRESSION: Decrease in some of the pulmonary metastatic lesions as described above. The mass lesion abutting the mediastinum on the left has decreased in size from the prior exam. New mild pericardial effusion. Increase in size in a pelvic lymphadenopathy in the iliac chain. The left psoas mass and periaortic lymph node as well as changes in the right inguinal region are relatively stable as described. No new focal abnormality is seen. Electronically Signed   By: Inez Catalina M.D.   On: 09/23/2015 11:56   Ct Abdomen Pelvis W Contrast  Result Date: 09/23/2015 CLINICAL DATA:  History of endometrial carcinoma with metastatic disease and chest pain EXAM: CT CHEST, ABDOMEN, AND PELVIS WITH CONTRAST TECHNIQUE: Multidetector CT imaging of the chest, abdomen and pelvis was performed following the standard protocol during bolus administration of intravenous contrast. CONTRAST:  155mL ISOVUE-300 IOPAMIDOL (ISOVUE-300) INJECTION 61% COMPARISON:  06/14/2015, 08/01/2015 FINDINGS: CT CHEST FINDINGS Cardiovascular: Bovine arch anomaly is noted. The thoracic aorta show some calcific changes without evidence of dissection or aneurysmal dilatation. The pulmonary artery as visualized is within normal limits. Timing was not optimum for pulmonary artery evaluation. Mild pericardial effusion is noted new from the prior exam. Mediastinum/Nodes: The thoracic inlet is within normal limits. There is again noted a 80 somewhat necrotic mass which abuts the mediastinum on the  left. It measures approximately 7.2 x 6.6 cm in greatest transverse and AP dimensions respectively. This is a slight  decrease when compared with the prior exam. Some mediastinal adenopathy is noted in the aorticopulmonary window measuring 3.0 cm in short axis. This also demonstrates central decreased attenuation consistent with necrosis. Lungs/Pleura: The lungs are well aerated bilaterally and again demonstrate a large left upper lobe mass lesion which abuts the major fissure. It measures approximately 5.0 by 4.0 cm in greatest dimension. This is also decreased in size from prior exam measuring 5.5 x 5.1 cm. Left lower lobe nodule with spiculation is noted and also reduced in size now measuring approximately 19 mm in greatest dimension decreased from the prior exam measuring 26 mm in greatest dimension. The right lung is well aerated and demonstrates some dependent atelectatic changes. A tiny nodule is noted in the lateral aspect of the right upper lobe also decreased in size from the prior PET-CT. A second right lower lobe nodule is seen measuring approximately 9 mm. It demonstrates some tenting of the adjacent major fissure. It is slightly decreased in size from the prior exam at which time it measured 11 mm. Small left pleural effusion is noted. Musculoskeletal: No acute bony abnormality is noted. CT ABDOMEN PELVIS FINDINGS Hepatobiliary: Liver demonstrates some hypodensities adjacent to the falciform ligament consistent with focal fatty infiltration. The gallbladder is well distended. Pancreas: Within normal limits. Spleen: Within normal limits. Adrenals/Urinary Tract: The adrenal glands are unremarkable. The kidneys demonstrate renal cystic change on the right as well as a nonobstructing renal stone stable from the prior exam. The bladder is well distended without focal abnormality. Stomach/Bowel: Stomach is within normal limits. No evidence of bowel wall thickening, distention, or inflammatory changes. Appendix appears normal. Vascular/Lymphatic: Aortic atherosclerosis. The previously seen left pelvic lymphadenopathy in the  iliac chain has increased in size from the prior exam measuring 3.7 x 2.6 cm. It previously measured 3 x 2 cm. Persistent soft tissue changes are noted in the right inguinal region but slightly decreased when compared with the prior exam. No central necrotic component is noted. Stable left psoas mass is noted. Stable left periaortic lymph node is noted as well. Reproductive: Status post hysterectomy. No adnexal masses. Musculoskeletal: No acute bony abnormality is noted. IMPRESSION: Decrease in some of the pulmonary metastatic lesions as described above. The mass lesion abutting the mediastinum on the left has decreased in size from the prior exam. New mild pericardial effusion. Increase in size in a pelvic lymphadenopathy in the iliac chain. The left psoas mass and periaortic lymph node as well as changes in the right inguinal region are relatively stable as described. No new focal abnormality is seen. Electronically Signed   By: Inez Catalina M.D.   On: 09/23/2015 11:56    Procedures Procedures (including critical care time)  Medications Ordered in ED Medications  labetalol (NORMODYNE) tablet 200 mg (200 mg Oral Given 09/23/15 2311)  apixaban (ELIQUIS) tablet 5 mg (5 mg Oral Given 09/23/15 2311)  polyethylene glycol (MIRALAX / GLYCOLAX) packet 17 g (not administered)  cloNIDine (CATAPRES) tablet 0.1 mg (0.1 mg Oral Given 09/23/15 1744)  0.9 %  sodium chloride infusion ( Intravenous New Bag/Given 09/24/15 0345)  promethazine (PHENERGAN) injection 12.5-25 mg (12.5 mg Intravenous Given 09/23/15 2102)  diphenhydrAMINE (BENADRYL) injection 12.5 mg (not administered)  feeding supplement (ENSURE ENLIVE) (ENSURE ENLIVE) liquid 237 mL (not administered)  fentaNYL (SUBLIMAZE) injection 50-75 mcg (75 mcg Intravenous Given 09/24/15 0850)  pantoprazole (PROTONIX) EC tablet  40 mg (not administered)  ondansetron (ZOFRAN) tablet 4 mg (not administered)    Or  ondansetron (ZOFRAN) injection 4 mg (not administered)    LORazepam (ATIVAN) injection 0.5 mg (not administered)  sodium chloride 0.9 % bolus 1,000 mL (0 mLs Intravenous Stopped 09/23/15 1051)  ondansetron (ZOFRAN) injection 4 mg (4 mg Intravenous Given 09/23/15 1010)  fentaNYL (SUBLIMAZE) injection 75 mcg (75 mcg Intravenous Given 09/23/15 1019)  gi cocktail (Maalox,Lidocaine,Donnatal) (30 mLs Oral Given 09/23/15 1140)  iopamidol (ISOVUE-300) 61 % injection 100 mL (100 mLs Intravenous Contrast Given 09/23/15 1126)  fentaNYL (SUBLIMAZE) injection 75 mcg (50 mcg Intravenous Given 09/23/15 1315)  potassium chloride 10 mEq in 100 mL IVPB (10 mEq Intravenous Given 09/24/15 0030)     Initial Impression / Assessment and Plan / ED Course  I have reviewed the triage vital signs and the nursing notes.  Pertinent labs & imaging results that were available during my care of the patient were reviewed by me and considered in my medical decision making (see chart for details).  Clinical Course   60 yo AAF with PMHx of metastatic endometrial CA on radiation therapy to lungs who p/w nausea, emesis, and chest pain after vomiting violently. No hematemesis. On arrival here, VSS and WNL. Pt has moderate epigastric TTP. Primary suspicion is ongoing n/v 2/2 radiation esophagitis, with c/f possible MW tear, esophageal tear or rupture in setting of violent retching. Will check labs, CXR, likely CT.  Labs show moderate leukocytosis, otherwise WNL. Renal function at baseline. EKG non-ischemic and doubt pain is 2/2 ACS. CXR clear. Given leukocytosis, CP, and abdominal pain, will check CT for further assessment.  No signs of esophageal rupture on CT. Pt has persistent CP and vomiting. Will admit for IVF, pain control.  Final Clinical Impressions(s) / ED Diagnoses   Final diagnoses:  Non-intractable vomiting with nausea, vomiting of unspecified type  Chest pain, unspecified chest pain type    New Prescriptions Current Discharge Medication List       Duffy Bruce,  MD 09/24/15 1124

## 2015-09-23 NOTE — ED Notes (Signed)
Pt cannot use restroom at this time, aware urine specimen is needed.  

## 2015-09-23 NOTE — Progress Notes (Signed)
Patient has taken flu vaccine at her local drug stores in the past with no allergic reaction

## 2015-09-24 ENCOUNTER — Other Ambulatory Visit: Payer: Self-pay

## 2015-09-24 DIAGNOSIS — C541 Malignant neoplasm of endometrium: Secondary | ICD-10-CM | POA: Diagnosis not present

## 2015-09-24 DIAGNOSIS — Z86718 Personal history of other venous thrombosis and embolism: Secondary | ICD-10-CM | POA: Diagnosis not present

## 2015-09-24 DIAGNOSIS — G893 Neoplasm related pain (acute) (chronic): Secondary | ICD-10-CM | POA: Diagnosis present

## 2015-09-24 DIAGNOSIS — T402X5A Adverse effect of other opioids, initial encounter: Secondary | ICD-10-CM | POA: Diagnosis not present

## 2015-09-24 DIAGNOSIS — I313 Pericardial effusion (noninflammatory): Secondary | ICD-10-CM | POA: Diagnosis present

## 2015-09-24 DIAGNOSIS — D649 Anemia, unspecified: Secondary | ICD-10-CM | POA: Diagnosis not present

## 2015-09-24 DIAGNOSIS — I10 Essential (primary) hypertension: Secondary | ICD-10-CM | POA: Diagnosis present

## 2015-09-24 DIAGNOSIS — C801 Malignant (primary) neoplasm, unspecified: Secondary | ICD-10-CM | POA: Diagnosis not present

## 2015-09-24 DIAGNOSIS — D638 Anemia in other chronic diseases classified elsewhere: Secondary | ICD-10-CM | POA: Diagnosis present

## 2015-09-24 DIAGNOSIS — R112 Nausea with vomiting, unspecified: Secondary | ICD-10-CM | POA: Diagnosis present

## 2015-09-24 DIAGNOSIS — R1110 Vomiting, unspecified: Secondary | ICD-10-CM | POA: Diagnosis not present

## 2015-09-24 DIAGNOSIS — G936 Cerebral edema: Secondary | ICD-10-CM | POA: Diagnosis present

## 2015-09-24 DIAGNOSIS — R110 Nausea: Secondary | ICD-10-CM | POA: Diagnosis not present

## 2015-09-24 DIAGNOSIS — C7931 Secondary malignant neoplasm of brain: Secondary | ICD-10-CM | POA: Diagnosis present

## 2015-09-24 DIAGNOSIS — L299 Pruritus, unspecified: Secondary | ICD-10-CM | POA: Diagnosis not present

## 2015-09-24 DIAGNOSIS — R079 Chest pain, unspecified: Secondary | ICD-10-CM | POA: Diagnosis not present

## 2015-09-24 DIAGNOSIS — Z515 Encounter for palliative care: Secondary | ICD-10-CM | POA: Diagnosis not present

## 2015-09-24 DIAGNOSIS — Z7189 Other specified counseling: Secondary | ICD-10-CM | POA: Diagnosis not present

## 2015-09-24 DIAGNOSIS — E860 Dehydration: Secondary | ICD-10-CM | POA: Diagnosis present

## 2015-09-24 DIAGNOSIS — R509 Fever, unspecified: Secondary | ICD-10-CM | POA: Diagnosis not present

## 2015-09-24 DIAGNOSIS — Z7901 Long term (current) use of anticoagulants: Secondary | ICD-10-CM | POA: Diagnosis not present

## 2015-09-24 DIAGNOSIS — Z923 Personal history of irradiation: Secondary | ICD-10-CM | POA: Diagnosis not present

## 2015-09-24 DIAGNOSIS — I82402 Acute embolism and thrombosis of unspecified deep veins of left lower extremity: Secondary | ICD-10-CM | POA: Diagnosis not present

## 2015-09-24 DIAGNOSIS — E876 Hypokalemia: Secondary | ICD-10-CM | POA: Diagnosis present

## 2015-09-24 DIAGNOSIS — C775 Secondary and unspecified malignant neoplasm of intrapelvic lymph nodes: Secondary | ICD-10-CM | POA: Diagnosis present

## 2015-09-24 DIAGNOSIS — Z803 Family history of malignant neoplasm of breast: Secondary | ICD-10-CM | POA: Diagnosis not present

## 2015-09-24 DIAGNOSIS — T502X5A Adverse effect of carbonic-anhydrase inhibitors, benzothiadiazides and other diuretics, initial encounter: Secondary | ICD-10-CM | POA: Diagnosis not present

## 2015-09-24 DIAGNOSIS — R0789 Other chest pain: Secondary | ICD-10-CM | POA: Diagnosis not present

## 2015-09-24 DIAGNOSIS — G43A0 Cyclical vomiting, not intractable: Secondary | ICD-10-CM | POA: Diagnosis not present

## 2015-09-24 DIAGNOSIS — T451X5A Adverse effect of antineoplastic and immunosuppressive drugs, initial encounter: Secondary | ICD-10-CM | POA: Diagnosis present

## 2015-09-24 DIAGNOSIS — Z79811 Long term (current) use of aromatase inhibitors: Secondary | ICD-10-CM | POA: Diagnosis not present

## 2015-09-24 DIAGNOSIS — G620 Drug-induced polyneuropathy: Secondary | ICD-10-CM | POA: Diagnosis present

## 2015-09-24 DIAGNOSIS — C7800 Secondary malignant neoplasm of unspecified lung: Secondary | ICD-10-CM | POA: Diagnosis present

## 2015-09-24 DIAGNOSIS — Z8542 Personal history of malignant neoplasm of other parts of uterus: Secondary | ICD-10-CM | POA: Diagnosis not present

## 2015-09-24 DIAGNOSIS — Z79899 Other long term (current) drug therapy: Secondary | ICD-10-CM | POA: Diagnosis not present

## 2015-09-24 DIAGNOSIS — Z8543 Personal history of malignant neoplasm of ovary: Secondary | ICD-10-CM | POA: Diagnosis not present

## 2015-09-24 LAB — CBC
HEMATOCRIT: 25.9 % — AB (ref 36.0–46.0)
HEMOGLOBIN: 8.2 g/dL — AB (ref 12.0–15.0)
MCH: 23.9 pg — ABNORMAL LOW (ref 26.0–34.0)
MCHC: 31.7 g/dL (ref 30.0–36.0)
MCV: 75.5 fL — AB (ref 78.0–100.0)
Platelets: 266 10*3/uL (ref 150–400)
RBC: 3.43 MIL/uL — ABNORMAL LOW (ref 3.87–5.11)
RDW: 19.3 % — AB (ref 11.5–15.5)
WBC: 13.2 10*3/uL — AB (ref 4.0–10.5)

## 2015-09-24 LAB — BASIC METABOLIC PANEL
ANION GAP: 7 (ref 5–15)
BUN: 13 mg/dL (ref 6–20)
CHLORIDE: 107 mmol/L (ref 101–111)
CO2: 23 mmol/L (ref 22–32)
Calcium: 7.9 mg/dL — ABNORMAL LOW (ref 8.9–10.3)
Creatinine, Ser: 0.98 mg/dL (ref 0.44–1.00)
GFR calc Af Amer: >60 mL/min (ref >60)
GFR calc non Af Amer: >60 mL/min (ref >60)
GLUCOSE: 119 mg/dL — AB (ref 65–99)
POTASSIUM: 4.2 mmol/L (ref 3.5–5.1)
Sodium: 137 mmol/L (ref 135–145)

## 2015-09-24 MED ORDER — FENTANYL CITRATE (PF) 100 MCG/2ML IJ SOLN
50.0000 ug | INTRAMUSCULAR | Status: DC | PRN
  Administered 2015-09-24 – 2015-09-26 (×15): 75 ug via INTRAVENOUS
  Filled 2015-09-24 (×15): qty 2

## 2015-09-24 MED ORDER — ONDANSETRON HCL 4 MG/2ML IJ SOLN
4.0000 mg | Freq: Four times a day (QID) | INTRAMUSCULAR | Status: DC
  Administered 2015-09-24 – 2015-09-28 (×14): 4 mg via INTRAVENOUS
  Filled 2015-09-24 (×17): qty 2

## 2015-09-24 MED ORDER — LORAZEPAM 2 MG/ML IJ SOLN
0.5000 mg | Freq: Four times a day (QID) | INTRAMUSCULAR | Status: DC | PRN
  Administered 2015-09-27: 0.5 mg via INTRAVENOUS
  Filled 2015-09-24 (×2): qty 1

## 2015-09-24 MED ORDER — ONDANSETRON HCL 4 MG PO TABS
4.0000 mg | ORAL_TABLET | Freq: Four times a day (QID) | ORAL | Status: DC
  Administered 2015-09-25 – 2015-09-26 (×3): 4 mg via ORAL
  Filled 2015-09-24 (×4): qty 1

## 2015-09-24 MED ORDER — PANTOPRAZOLE SODIUM 40 MG PO TBEC
40.0000 mg | DELAYED_RELEASE_TABLET | Freq: Two times a day (BID) | ORAL | Status: DC
  Administered 2015-09-24 – 2015-10-01 (×11): 40 mg via ORAL
  Filled 2015-09-24 (×13): qty 1

## 2015-09-24 NOTE — Progress Notes (Signed)
PROGRESS NOTE    [name]  [id number] DOB: Nov 11, 1955 DOA: 09/23/2015  PCP: [facility]   Brief Narrative:  [name] is a 60 y.o. 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 She presented to the emergency department after suffering 1 day of significant worsening nausea and vomiting with associated chest pain that started this morning. Chest pain made worse with vomiting. No hematemesis. Pain does not radiate. She took an aspirin which did not help with her pain. She has been receiving radiation therapy for her metastatic disease and was told that the radiation could be causing the nausea and vomiting   Subjective: Feels nauseated. No vomiting. Mild upper abdominal and substernal discomfort. No fever or diarrhea.   Assessment & Plan:   Principal Problem:   Nausea & vomiting with fever - CT abd/ pelvis unrevealing - ? 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   H/o LLE DVT in 2/17 - cont Eliquis- oncology following    Essential hypertension - on  Clonidine, labetalol, HCTZ- hold HCTZ while she is dehydrated  Cancer related pain - hold oral medications for now and cont PRN Fentanyl    Anemia of chronic disease - stable - baseline is between 8-10    Pericardial effusion - noted on CT chest in ER- this is mild and does not need further work up at this point   DVT prophylaxis: apixaban Code Status: Full code (previously made DNR by [name] but now wants Full code) Family Communication:  Disposition Plan: home in 2-3 days Consultants:    Procedures:    Antimicrobials:  Anti-infectives    None       Objective: Vitals:   09/23/15 2232 09/24/15 0022 09/24/15 0149 09/24/15 0644  BP:  (!) 155/82  127/70  Pulse:  98  84  Resp:  17  18  Temp: 99.5 F (37.5 C) (!) 102 F (38.9 C) (!) 100.4 F (38 C) 98.8 F (37.1 C)  TempSrc: Axillary Axillary Oral Oral  SpO2:    98%  Weight:      Height:        Intake/Output Summary (Last 24 hours) at 09/24/15 1308 Last data filed at 09/24/15 1137  Gross per 24 hour  Intake             1625 ml  Output                0 ml  Net             1625 ml   Filed Weights   09/23/15 0843 09/23/15 1555  Weight: 67.1 kg (148  lb) 70.2 kg (154 lb 12.2 oz)    Examination: General exam: Appears comfortable  HEENT: PERRLA, oral mucosa moist, no sclera icterus or thrush Respiratory system: Clear to auscultation. Respiratory effort normal. Cardiovascular system: S1 & S2 heard, RRR.  No murmurs  Gastrointestinal system: Abdomen soft, mildly tender in epigastrium, nondistended. Normal bowel sound. No organomegaly Central nervous system: Alert and oriented. No focal neurological deficits. Extremities: No cyanosis, clubbing or edema Skin: No rashes or ulcers Psychiatry:  Mood & affect appropriate.     Data Reviewed: I have personally reviewed following labs and imaging studies  CBC:  Recent Labs Lab 09/23/15 0858 09/24/15 0417  WBC 14.5* 13.2*  HGB 9.6* 8.2*  HCT 30.6* 25.9*    MCV 76.9* 75.5*  PLT 313 [id number]   Basic Metabolic Panel:  Recent Labs Lab 09/23/15 0858 09/24/15 0417  NA 139 137  K 3.0* 4.2  CL 102 107  CO2 25 23  GLUCOSE 170* 119*  BUN 10 13  CREATININE 0.93 0.98  CALCIUM 9.2 7.9*   GFR: Estimated Creatinine Clearance: 55.9 mL/min (by C-G formula based on SCr of 0.98 mg/dL). Liver Function Tests:  Recent Labs Lab 09/23/15 0858  AST 11*  ALT 8*  ALKPHOS 81  BILITOT 0.6  PROT 7.8  ALBUMIN 3.5    Recent Labs Lab 09/23/15 0858  LIPASE 12   No results for input(s): AMMONIA in the last 168 hours. Coagulation Profile: No results for input(s): INR, PROTIME in the last 168 hours. Cardiac Enzymes: No results for input(s): CKTOTAL, CKMB, CKMBINDEX, TROPONINI in the last 168 hours. BNP (last 3 results) No results for input(s): PROBNP in the last 8760 hours. HbA1C: No results for input(s): HGBA1C in the last 72 hours. CBG: No results for input(s): GLUCAP in the last 168 hours. Lipid Profile: No results for input(s): CHOL, HDL, LDLCALC, TRIG, CHOLHDL, LDLDIRECT in the last 72 hours. Thyroid Function Tests: No results for input(s): TSH, T4TOTAL, FREET4, T3FREE, THYROIDAB in the last 72 hours. Anemia Panel: No results for input(s): VITAMINB12, FOLATE, FERRITIN, TIBC, IRON, RETICCTPCT in the last 72 hours. Urine analysis:    Component Value Date/Time   COLORURINE YELLOW 09/12/2015 1400   APPEARANCEUR CLEAR 09/12/2015 1400   LABSPEC 1.010 09/12/2015 1400   PHURINE 7.0 09/12/2015 1400   GLUCOSEU NEGATIVE 09/12/2015 1400   HGBUR NEGATIVE 09/12/2015 1400   BILIRUBINUR NEGATIVE 09/12/2015 1400   KETONESUR 40 (A) 09/12/2015 1400   PROTEINUR NEGATIVE 09/12/2015 1400   UROBILINOGEN 0.2 03/03/2008 1216   NITRITE NEGATIVE 09/12/2015 1400   LEUKOCYTESUR TRACE (A) 09/12/2015 1400   Sepsis Labs: @LABRCNTIP (procalcitonin:4,lacticidven:4) )No results found for this or any previous visit (from the past 240 hour(s)).       Radiology  Studies: Dg Chest 2 View  Result Date: 09/23/2015 CLINICAL DATA:  Mid chest pain hand shortness of breath since 6 a.m. today. EXAM: CHEST  2 VIEW COMPARISON:  09/12/2015 FINDINGS: Left upper lobe mass again noted not significantly changed. Right Port-A-Cath is unchanged. Mild cardiomegaly. No confluent opacity on the right. No effusions or acute bony abnormality. IMPRESSION: Stable left upper lobe mass. Mild cardiomegaly. Electronically Signed   By: Rolm Baptise M.D.   On: 09/23/2015 10:56   Ct Chest W Contrast  Result Date: 09/23/2015 CLINICAL DATA:  History of endometrial carcinoma with metastatic disease and chest pain EXAM: CT CHEST, ABDOMEN, AND PELVIS WITH CONTRAST TECHNIQUE: Multidetector CT imaging of the chest, abdomen and pelvis was performed following the standard protocol during bolus administration  of intravenous contrast. CONTRAST:  129mL ISOVUE-300 IOPAMIDOL (ISOVUE-300) INJECTION 61% COMPARISON:  06/14/2015, 08/01/2015 FINDINGS: CT CHEST FINDINGS Cardiovascular: Bovine arch anomaly is noted. The thoracic aorta show some calcific changes without evidence of dissection or aneurysmal dilatation. The pulmonary artery as visualized is within normal limits. Timing was not optimum for pulmonary artery evaluation. Mild pericardial effusion is noted new from the prior exam. Mediastinum/Nodes: The thoracic inlet is within normal limits. There is again noted a 80 somewhat necrotic mass which abuts the mediastinum on the left. It measures approximately 7.2 x 6.6 cm in greatest transverse and AP dimensions respectively. This is a slight decrease when compared with the prior exam. Some mediastinal adenopathy is noted in the aorticopulmonary window measuring 3.0 cm in short axis. This also demonstrates central decreased attenuation consistent with necrosis. Lungs/Pleura: The lungs are well aerated bilaterally and again demonstrate a large left upper lobe mass lesion which abuts the major fissure. It measures  approximately 5.0 by 4.0 cm in greatest dimension. This is also decreased in size from prior exam measuring 5.5 x 5.1 cm. Left lower lobe nodule with spiculation is noted and also reduced in size now measuring approximately 19 mm in greatest dimension decreased from the prior exam measuring 26 mm in greatest dimension. The right lung is well aerated and demonstrates some dependent atelectatic changes. A tiny nodule is noted in the lateral aspect of the right upper lobe also decreased in size from the prior PET-CT. A second right lower lobe nodule is seen measuring approximately 9 mm. It demonstrates some tenting of the adjacent major fissure. It is slightly decreased in size from the prior exam at which time it measured 11 mm. Small left pleural effusion is noted. Musculoskeletal: No acute bony abnormality is noted. CT ABDOMEN PELVIS FINDINGS Hepatobiliary: Liver demonstrates some hypodensities adjacent to the falciform ligament consistent with focal fatty infiltration. The gallbladder is well distended. Pancreas: Within normal limits. Spleen: Within normal limits. Adrenals/Urinary Tract: The adrenal glands are unremarkable. The kidneys demonstrate renal cystic change on the right as well as a nonobstructing renal stone stable from the prior exam. The bladder is well distended without focal abnormality. Stomach/Bowel: Stomach is within normal limits. No evidence of bowel wall thickening, distention, or inflammatory changes. Appendix appears normal. Vascular/Lymphatic: Aortic atherosclerosis. The previously seen left pelvic lymphadenopathy in the iliac chain has increased in size from the prior exam measuring 3.7 x 2.6 cm. It previously measured 3 x 2 cm. Persistent soft tissue changes are noted in the right inguinal region but slightly decreased when compared with the prior exam. No central necrotic component is noted. Stable left psoas mass is noted. Stable left periaortic lymph node is noted as well. Reproductive:  Status post hysterectomy. No adnexal masses. Musculoskeletal: No acute bony abnormality is noted. IMPRESSION: Decrease in some of the pulmonary metastatic lesions as described above. The mass lesion abutting the mediastinum on the left has decreased in size from the prior exam. New mild pericardial effusion. Increase in size in a pelvic lymphadenopathy in the iliac chain. The left psoas mass and periaortic lymph node as well as changes in the right inguinal region are relatively stable as described. No new focal abnormality is seen. Electronically Signed   By: Inez Catalina M.D.   On: 09/23/2015 11:56   Ct Abdomen Pelvis W Contrast  Result Date: 09/23/2015 CLINICAL DATA:  History of endometrial carcinoma with metastatic disease and chest pain EXAM: CT CHEST, ABDOMEN, AND PELVIS WITH CONTRAST TECHNIQUE: Multidetector CT  imaging of the chest, abdomen and pelvis was performed following the standard protocol during bolus administration of intravenous contrast. CONTRAST:  15mL ISOVUE-300 IOPAMIDOL (ISOVUE-300) INJECTION 61% COMPARISON:  06/14/2015, 08/01/2015 FINDINGS: CT CHEST FINDINGS Cardiovascular: Bovine arch anomaly is noted. The thoracic aorta show some calcific changes without evidence of dissection or aneurysmal dilatation. The pulmonary artery as visualized is within normal limits. Timing was not optimum for pulmonary artery evaluation. Mild pericardial effusion is noted new from the prior exam. Mediastinum/Nodes: The thoracic inlet is within normal limits. There is again noted a 80 somewhat necrotic mass which abuts the mediastinum on the left. It measures approximately 7.2 x 6.6 cm in greatest transverse and AP dimensions respectively. This is a slight decrease when compared with the prior exam. Some mediastinal adenopathy is noted in the aorticopulmonary window measuring 3.0 cm in short axis. This also demonstrates central decreased attenuation consistent with necrosis. Lungs/Pleura: The lungs are well  aerated bilaterally and again demonstrate a large left upper lobe mass lesion which abuts the major fissure. It measures approximately 5.0 by 4.0 cm in greatest dimension. This is also decreased in size from prior exam measuring 5.5 x 5.1 cm. Left lower lobe nodule with spiculation is noted and also reduced in size now measuring approximately 19 mm in greatest dimension decreased from the prior exam measuring 26 mm in greatest dimension. The right lung is well aerated and demonstrates some dependent atelectatic changes. A tiny nodule is noted in the lateral aspect of the right upper lobe also decreased in size from the prior PET-CT. A second right lower lobe nodule is seen measuring approximately 9 mm. It demonstrates some tenting of the adjacent major fissure. It is slightly decreased in size from the prior exam at which time it measured 11 mm. Small left pleural effusion is noted. Musculoskeletal: No acute bony abnormality is noted. CT ABDOMEN PELVIS FINDINGS Hepatobiliary: Liver demonstrates some hypodensities adjacent to the falciform ligament consistent with focal fatty infiltration. The gallbladder is well distended. Pancreas: Within normal limits. Spleen: Within normal limits. Adrenals/Urinary Tract: The adrenal glands are unremarkable. The kidneys demonstrate renal cystic change on the right as well as a nonobstructing renal stone stable from the prior exam. The bladder is well distended without focal abnormality. Stomach/Bowel: Stomach is within normal limits. No evidence of bowel wall thickening, distention, or inflammatory changes. Appendix appears normal. Vascular/Lymphatic: Aortic atherosclerosis. The previously seen left pelvic lymphadenopathy in the iliac chain has increased in size from the prior exam measuring 3.7 x 2.6 cm. It previously measured 3 x 2 cm. Persistent soft tissue changes are noted in the right inguinal region but slightly decreased when compared with the prior exam. No central  necrotic component is noted. Stable left psoas mass is noted. Stable left periaortic lymph node is noted as well. Reproductive: Status post hysterectomy. No adnexal masses. Musculoskeletal: No acute bony abnormality is noted. IMPRESSION: Decrease in some of the pulmonary metastatic lesions as described above. The mass lesion abutting the mediastinum on the left has decreased in size from the prior exam. New mild pericardial effusion. Increase in size in a pelvic lymphadenopathy in the iliac chain. The left psoas mass and periaortic lymph node as well as changes in the right inguinal region are relatively stable as described. No new focal abnormality is seen. Electronically Signed   By: Inez Catalina M.D.   On: 09/23/2015 11:56      Scheduled Meds: . apixaban  5 mg Oral BID  . cloNIDine  0.1  mg Oral Daily  . feeding supplement (ENSURE ENLIVE)  237 mL Oral BID BM  . labetalol  200 mg Oral TID  . ondansetron  4 mg Oral Q6H   Or  . ondansetron (ZOFRAN) IV  4 mg Intravenous Q6H  . pantoprazole  40 mg Oral BID AC   Continuous Infusions: . sodium chloride 100 mL/hr at 09/24/15 0345     LOS: 0 days    Time spent in minutes: 33    [place], MD [facility] Pager: [email or web address] Password [email or web address] 09/24/2015, 1:08 PM

## 2015-09-25 DIAGNOSIS — G43A0 Cyclical vomiting, not intractable: Secondary | ICD-10-CM

## 2015-09-25 LAB — BASIC METABOLIC PANEL
Anion gap: 7 (ref 5–15)
BUN: 12 mg/dL (ref 6–20)
CALCIUM: 7.6 mg/dL — AB (ref 8.9–10.3)
CHLORIDE: 110 mmol/L (ref 101–111)
CO2: 21 mmol/L — AB (ref 22–32)
CREATININE: 0.84 mg/dL (ref 0.44–1.00)
GFR calc non Af Amer: >60 mL/min (ref >60)
GLUCOSE: 96 mg/dL (ref 65–99)
Potassium: 4 mmol/L (ref 3.5–5.1)
Sodium: 138 mmol/L (ref 135–145)

## 2015-09-25 LAB — CBC
HCT: 23.8 % — ABNORMAL LOW (ref 36.0–46.0)
Hemoglobin: 7.6 g/dL — ABNORMAL LOW (ref 12.0–15.0)
MCH: 24.3 pg — AB (ref 26.0–34.0)
MCHC: 31.9 g/dL (ref 30.0–36.0)
MCV: 76 fL — AB (ref 78.0–100.0)
PLATELETS: 252 10*3/uL (ref 150–400)
RBC: 3.13 MIL/uL — AB (ref 3.87–5.11)
RDW: 19.8 % — AB (ref 11.5–15.5)
WBC: 9.6 10*3/uL (ref 4.0–10.5)

## 2015-09-25 LAB — URINALYSIS, ROUTINE W REFLEX MICROSCOPIC
GLUCOSE, UA: NEGATIVE mg/dL
Hgb urine dipstick: NEGATIVE
KETONES UR: NEGATIVE mg/dL
Nitrite: NEGATIVE
PH: 5 (ref 5.0–8.0)
Protein, ur: NEGATIVE mg/dL
SPECIFIC GRAVITY, URINE: 1.028 (ref 1.005–1.030)

## 2015-09-25 LAB — URINE MICROSCOPIC-ADD ON: RBC / HPF: NONE SEEN RBC/hpf (ref 0–5)

## 2015-09-25 NOTE — Progress Notes (Addendum)
PROGRESS NOTE    [name]  [id number] DOB: Dec 18, 1955 DOA: 09/23/2015  PCP: [facility]   Brief Narrative:  [name] is a 60 y.o. 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 She presented to the emergency department after suffering 1 day of significant worsening nausea and vomiting with associated chest pain that started this morning. Chest pain made worse with vomiting. No hematemesis. Pain does not radiate. She took an aspirin which did not help with her pain. She has been receiving radiation therapy for her metastatic disease and was told that the radiation could be causing the nausea and vomiting   Subjective: Vomiting resolved- nausea is improving but not fully resolved. No abdominal pain today.    Assessment & Plan:   Principal Problem:   Nausea & vomiting with fever - CT abd/ pelvis unrevealing - ? gastroenteritis - nausea and vomiting began when she began radiation to the chest and likely is radiation gastritis or esophagitis- she has now completed 3 wks of radiation - will cont PPI but make it BID, cont antiemetics- slow advancement of diet   Active Problems:  Fever - ? 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    Pericardial effusion - noted on CT chest in ER- this is mild and does not need further work up at this point   DVT prophylaxis: apixaban Code Status: Full code (previously made DNR by Dr Marko Plume but now wants Full code) Family Communication:  Disposition Plan: home in 2-3 days Consultants:    Procedures:    Antimicrobials:  Anti-infectives    None       Objective: Vitals:   09/24/15 0644 09/24/15 1321 09/24/15 2116 09/25/15 0548  BP: 127/70 121/71 122/70 (!) 160/90  Pulse: 84 79 90 94  Resp: 18 16 18 16   Temp: 98.8 F (37.1 C) 98 F (36.7 C) 99.1 F (37.3 C) 98.6 F (37 C)  TempSrc: Oral Oral Oral Oral  SpO2: 98% 99% 97% 95%  Weight:    69.7 kg (153 lb 10.6 oz)  Height:        Intake/Output Summary (Last 24 hours) at 09/25/15 1326 Last data filed at 09/25/15 1114  Gross per 24 hour  Intake              870 ml  Output  150 ml  Net              720 ml   Filed Weights   09/23/15 0843 09/23/15 1555 09/25/15 0548  Weight: 67.1 kg (148 lb) 70.2 kg (154 lb 12.2 oz) 69.7 kg (153 lb 10.6 oz)    Examination: General exam: Appears comfortable  HEENT: PERRLA, oral mucosa moist, no sclera icterus or thrush Respiratory system: Clear to auscultation. Respiratory effort normal. Cardiovascular system: S1 & S2 heard, RRR.  No murmurs  Gastrointestinal system: Abdomen soft, non-tender today, nondistended. Normal bowel sound. No organomegaly Central nervous system: Alert and oriented. No focal neurological deficits. Extremities: No cyanosis, clubbing or edema Skin: No rashes or  ulcers Psychiatry:  Mood & affect appropriate.     Data Reviewed: I have personally reviewed following labs and imaging studies  CBC:  Recent Labs Lab 09/23/15 0858 09/24/15 0417 09/25/15 0357  WBC 14.5* 13.2* 9.6  HGB 9.6* 8.2* 7.6*  HCT 30.6* 25.9* 23.8*  MCV 76.9* 75.5* 76.0*  PLT 313 266 [id number]   Basic Metabolic Panel:  Recent Labs Lab 09/23/15 0858 09/24/15 0417 09/25/15 0357  NA 139 137 138  K 3.0* 4.2 4.0  CL 102 107 110  CO2 25 23 21*  GLUCOSE 170* 119* 96  BUN 10 13 12   CREATININE 0.93 0.98 0.84  CALCIUM 9.2 7.9* 7.6*   GFR: Estimated Creatinine Clearance: 65 mL/min (by C-G formula based on SCr of 0.84 mg/dL). Liver Function Tests:  Recent Labs Lab 09/23/15 0858  AST 11*  ALT 8*  ALKPHOS 81  BILITOT 0.6  PROT 7.8  ALBUMIN 3.5    Recent Labs Lab 09/23/15 0858  LIPASE 12   No results for input(s): AMMONIA in the last 168 hours. Coagulation Profile: No results for input(s): INR, PROTIME in the last 168 hours. Cardiac Enzymes: No results for input(s): CKTOTAL, CKMB, CKMBINDEX, TROPONINI in the last 168 hours. BNP (last 3 results) No results for input(s): PROBNP in the last 8760 hours. HbA1C: No results for input(s): HGBA1C in the last 72 hours. CBG: No results for input(s): GLUCAP in the last 168 hours. Lipid Profile: No results for input(s): CHOL, HDL, LDLCALC, TRIG, CHOLHDL, LDLDIRECT in the last 72 hours. Thyroid Function Tests: No results for input(s): TSH, T4TOTAL, FREET4, T3FREE, THYROIDAB in the last 72 hours. Anemia Panel: No results for input(s): VITAMINB12, FOLATE, FERRITIN, TIBC, IRON, RETICCTPCT in the last 72 hours. Urine analysis:    Component Value Date/Time   COLORURINE AMBER (A) 09/25/2015 0210   APPEARANCEUR CLOUDY (A) 09/25/2015 0210   LABSPEC 1.028 09/25/2015 0210   PHURINE 5.0 09/25/2015 0210   GLUCOSEU NEGATIVE 09/25/2015 0210   HGBUR NEGATIVE 09/25/2015 0210   BILIRUBINUR SMALL (A) 09/25/2015 0210   KETONESUR  NEGATIVE 09/25/2015 0210   PROTEINUR NEGATIVE 09/25/2015 0210   UROBILINOGEN 0.2 03/03/2008 1216   NITRITE NEGATIVE 09/25/2015 0210   LEUKOCYTESUR SMALL (A) 09/25/2015 0210   Sepsis Labs: @LABRCNTIP (procalcitonin:4,lacticidven:4) ) Recent Results (from the past 240 hour(s))  Culture, blood (Routine X 2) w Reflex to ID Panel     Status: None (Preliminary result)   Collection Time: 09/24/15  3:20 PM  Result Value Ref Range Status   Specimen Description BLOOD RIGHT ARM  Final   Special Requests BOTTLES DRAWN AEROBIC AND ANAEROBIC 5CC EA  Final   Culture PENDING  Incomplete   Report Status PENDING  Incomplete  Culture, blood (Routine X 2) w Reflex to ID Panel     Status: None (Preliminary  result)   Collection Time: 09/24/15  3:20 PM  Result Value Ref Range Status   Specimen Description BLOOD RIGHT HAND  Final   Special Requests BOTTLES DRAWN AEROBIC AND ANAEROBIC 5CC EA  Final   Culture PENDING  Incomplete   Report Status PENDING  Incomplete         Radiology Studies: No results found.    Scheduled Meds: . apixaban  5 mg Oral BID  . cloNIDine  0.1 mg Oral Daily  . feeding supplement (ENSURE ENLIVE)  237 mL Oral BID BM  . labetalol  200 mg Oral TID  . ondansetron  4 mg Oral Q6H   Or  . ondansetron (ZOFRAN) IV  4 mg Intravenous Q6H  . pantoprazole  40 mg Oral BID AC   Continuous Infusions: . sodium chloride 100 mL/hr at 09/25/15 0808     LOS: 1 day    Time spent in minutes: 59    [place], MD [facility] Pager: [email or web address] Password [email or web address] 09/25/2015, 1:26 PM

## 2015-09-26 LAB — CBC
HEMATOCRIT: 23.1 % — AB (ref 36.0–46.0)
Hemoglobin: 7.3 g/dL — ABNORMAL LOW (ref 12.0–15.0)
MCH: 24.4 pg — AB (ref 26.0–34.0)
MCHC: 31.6 g/dL (ref 30.0–36.0)
MCV: 77.3 fL — AB (ref 78.0–100.0)
Platelets: 283 10*3/uL (ref 150–400)
RBC: 2.99 MIL/uL — ABNORMAL LOW (ref 3.87–5.11)
RDW: 19.8 % — AB (ref 11.5–15.5)
WBC: 6.5 10*3/uL (ref 4.0–10.5)

## 2015-09-26 MED ORDER — LIDOCAINE-PRILOCAINE 2.5-2.5 % EX CREA
TOPICAL_CREAM | Freq: Once | CUTANEOUS | Status: AC
  Administered 2015-09-26: 13:00:00 via TOPICAL
  Filled 2015-09-26: qty 5

## 2015-09-26 MED ORDER — FENTANYL CITRATE (PF) 100 MCG/2ML IJ SOLN
25.0000 ug | INTRAMUSCULAR | Status: DC | PRN
  Administered 2015-09-26 – 2015-09-27 (×6): 25 ug via INTRAVENOUS
  Filled 2015-09-26 (×6): qty 2

## 2015-09-26 MED ORDER — DICLOFENAC SODIUM 1 % TD GEL
2.0000 g | Freq: Four times a day (QID) | TRANSDERMAL | Status: DC
Start: 2015-09-26 — End: 2015-10-01
  Administered 2015-09-26 – 2015-09-30 (×15): 2 g via TOPICAL
  Filled 2015-09-26: qty 100

## 2015-09-26 NOTE — Progress Notes (Signed)
PROGRESS NOTE    [name]  [id number] DOB: [birth year] DOA: 09/23/2015  PCP: [facility]   Brief Narrative:  [name] is a 60 y.o. 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 She presented to the emergency department after suffering 1 day of significant worsening nausea and vomiting with associated chest pain that started this morning. Chest pain made worse with vomiting. No hematemesis. Pain does not radiate. She took an aspirin which did not help with her pain. She has been receiving radiation therapy for her metastatic disease and was told that the radiation could be causing the nausea and vomiting   Subjective: Vomiting resolved and nowtolerating clears. No abdominal pain today.  Has some right sided chest pain which is positional and reproducible.   Assessment & Plan:   Principal Problem:   Nausea & vomiting with fever - CT abd/ pelvis unrevealing - ? gastroenteritis - nausea and vomiting began when she began radiation to the chest and likely is radiation gastritis or esophagitis- she has now completed 3 wks of radiation  - will cont PPI BID, cont antiemetics- slow advancement of diet - full liquids today  Active Problems:  Fever - ? 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   H/o LLE DVT in 2/17 - cont Eliquis- oncology following    Essential hypertension - on Clonidine, labetalol - hold HCTZ while she is dehydrated  Cancer related pain - hold oral medications for now and cont PRN Fentanyl which I have weaned today    Anemia of chronic disease - Hb has dropped from 8.2 to 7.6 today- no bleeding noted- follow - possibly dilutional - baseline is between 8-10- last anemia panel in 2/17 consistent with AOCD    Pericardial effusion - noted on CT chest in ER- this is mild and does not need further work up at this point   DVT prophylaxis: apixaban Code Status: Full code (previously made DNR by Dr Marko Plume but now wants Full code) Family Communication:  Disposition Plan: home in 2-3 days Consultants:    Procedures:    Antimicrobials:  Anti-infectives    None       Objective: Vitals:   09/25/15 1403 09/25/15 2100 09/26/15 0545 09/26/15 0854  BP: (!) 159/82 (!) 159/81 (!) 151/63   Pulse: 79 79 74   Resp: 16 18 18    Temp: 98.1 F (36.7 C) 99.1 F (37.3 C) 99 F (37.2 C)   TempSrc: Oral Oral Oral   SpO2: 97% 95% 100%   Weight:    72.6 kg (160 lb 0.9 oz)  Height:        Intake/Output Summary (Last 24 hours) at 09/26/15 1103 Last data filed at 09/26/15 0500  Gross per 24 hour  Intake  5455 ml  Output                0 ml  Net             5455 ml   Filed Weights   09/23/15 1555 09/25/15 0548 09/26/15 0854  Weight: 70.2 kg (154 lb 12.2 oz) 69.7 kg (153 lb 10.6 oz) 72.6 kg (160 lb 0.9 oz)    Examination: General exam: Appears comfortable  HEENT: PERRLA, oral mucosa moist, no sclera icterus or thrush Respiratory system: Clear to auscultation. Respiratory effort normal. Cardiovascular system: S1 & S2 heard, RRR.  No murmurs  Chest: tender in right chest wall in area of 3rd & 4th rib Gastrointestinal system: Abdomen soft, non-tender  today, nondistended. Normal bowel sound. No organomegaly Central nervous system: Alert and oriented. No focal neurological deficits. Extremities: No cyanosis, clubbing or edema Skin: No rashes or ulcers Psychiatry:  Mood & affect appropriate.     Data Reviewed: I have personally reviewed following labs and imaging studies  CBC:  Recent Labs Lab 09/23/15 0858 09/24/15 0417 09/25/15 0357  WBC 14.5* 13.2* 9.6  HGB 9.6* 8.2* 7.6*  HCT 30.6* 25.9* 23.8*  MCV 76.9* 75.5* 76.0*  PLT 313 266 [id number]   Basic Metabolic Panel:  Recent Labs Lab 09/23/15 0858 09/24/15 0417 09/25/15 0357  NA 139 137 138  K 3.0* 4.2 4.0  CL 102 107 110  CO2 25 23 21*  GLUCOSE 170* 119* 96  BUN 10 13 12   CREATININE 0.93 0.98 0.84  CALCIUM 9.2 7.9* 7.6*   GFR: Estimated Creatinine Clearance: 66.3 mL/min (by C-G formula based on SCr of 0.84 mg/dL). Liver Function Tests:  Recent Labs Lab 09/23/15 0858  AST 11*  ALT 8*  ALKPHOS 81  BILITOT 0.6  PROT 7.8  ALBUMIN 3.5    Recent Labs Lab 09/23/15 0858  LIPASE 12   No results for input(s): AMMONIA in the last 168 hours. Coagulation Profile: No results for input(s): INR, PROTIME in the last 168 hours. Cardiac Enzymes: No results for input(s): CKTOTAL, CKMB, CKMBINDEX, TROPONINI in the last 168 hours. BNP (last 3 results) No results for input(s): PROBNP in the last 8760 hours. HbA1C: No results for input(s): HGBA1C in the last 72 hours. CBG: No results for input(s): GLUCAP in the last 168 hours. Lipid Profile: No results for input(s): CHOL, HDL, LDLCALC, TRIG, CHOLHDL, LDLDIRECT in the last 72 hours. Thyroid Function Tests: No results for input(s): TSH, T4TOTAL, FREET4, T3FREE, THYROIDAB in the last 72 hours. Anemia Panel: No results for input(s): VITAMINB12, FOLATE, FERRITIN, TIBC, IRON, RETICCTPCT in the last 72 hours. Urine analysis:    Component Value Date/Time   COLORURINE AMBER (A) 09/25/2015 0210   APPEARANCEUR CLOUDY (A)  09/25/2015 0210   LABSPEC 1.028 09/25/2015 0210   PHURINE 5.0 09/25/2015 0210   GLUCOSEU NEGATIVE 09/25/2015 0210   HGBUR NEGATIVE 09/25/2015 0210   BILIRUBINUR SMALL (A) 09/25/2015 0210   KETONESUR NEGATIVE 09/25/2015 0210   PROTEINUR NEGATIVE 09/25/2015 0210   UROBILINOGEN 0.2 03/03/2008 1216   NITRITE NEGATIVE 09/25/2015 0210   LEUKOCYTESUR SMALL (A) 09/25/2015 0210   Sepsis Labs: @LABRCNTIP (procalcitonin:4,lacticidven:4) ) Recent Results (from the past 240 hour(s))  Culture, blood (Routine X 2) w Reflex to ID Panel     Status: None (Preliminary result)   Collection Time: 09/24/15  3:20 PM  Result Value Ref Range Status   Specimen Description BLOOD RIGHT ARM  Final   Special Requests BOTTLES DRAWN AEROBIC AND ANAEROBIC 5CC EA  Final   Culture PENDING  Incomplete   Report Status PENDING  Incomplete  Culture, blood (Routine X 2) w Reflex to ID Panel     Status: None (Preliminary result)   Collection Time: 09/24/15  3:20 PM  Result Value Ref Range Status   Specimen Description BLOOD RIGHT HAND  Final   Special Requests BOTTLES DRAWN AEROBIC AND ANAEROBIC 5CC EA  Final   Culture PENDING  Incomplete   Report Status PENDING  Incomplete         Radiology Studies: No results found.    Scheduled Meds: . apixaban  5 mg Oral BID  . cloNIDine  0.1 mg Oral Daily  . diclofenac sodium  2 g Topical QID  . feeding supplement (ENSURE ENLIVE)  237 mL Oral BID BM  . labetalol  200 mg Oral TID  . lidocaine-prilocaine   Topical Once  . ondansetron  4 mg Oral Q6H   Or  . ondansetron (ZOFRAN) IV  4 mg Intravenous Q6H  . pantoprazole  40 mg Oral BID AC   Continuous Infusions: . sodium chloride 100 mL/hr at 09/26/15 0500     LOS: 2 days    Time spent in minutes: 65    Brandon, MD [facility] Pager: [email or web address] Password [email or web address] 09/26/2015, 11:03 AM

## 2015-09-26 NOTE — Progress Notes (Signed)
Patient has been eating full liquids this shift, offered to move patient up to a soft diet if she felt able and she said she is struggling to eat the full liquids.  Clarified that struggling meant she just does not have an appetite.  Nausea is controlled with scheduled Zofran.

## 2015-09-26 NOTE — Progress Notes (Signed)
Initial Nutrition Assessment  DOCUMENTATION CODES:   Severe malnutrition in context of chronic illness  INTERVENTION:   Continue Ensure Enlive po BID, each supplement provides 350 kcal and 20 grams of protein Encourage PO intake RD to continue to monitor  NUTRITION DIAGNOSIS:   Malnutrition related to chronic illness as evidenced by percent weight loss, energy intake < or equal to 75% for > or equal to 1 month.  GOAL:   Patient will meet greater than or equal to 90% of their needs  MONITOR:   PO intake, Supplement acceptance, Labs, Weight trends, I & O's  REASON FOR ASSESSMENT:   Malnutrition Screening Tool    ASSESSMENT:   60 y.o. 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 She presented to the emergency department after suffering 1 day of significant worsening nausea and vomiting with associated chest pain that started this morning. Chest pain made worse with vomiting. No hematemesis. Pain does not radiate. She took an aspirin which did not help with her pain. She has been receiving radiation therapy for her metastatic disease and was told that the radiation could be causing the nausea and vomiting  Patient in room with family at bedside. Pt reports N/V PTA, last vomited on 3 days ago, 9/22. Pt continues to have some nausea, states this has improved today. Pt consuming clear liquids. This morning had 1/2 of her broth and most of her juice. Diet is advanced to full liquids today. Pt reports drinking Ensure supplements at home. She would like vanilla supplements during this admission. Pt denies any swallowing or chewing issues. She states sometimes she has taste changes where foods taste bland. Does not currently complain of bland tastes. Pt states she has had consistent poor appetite throughout cancer treatments.   Per weight history, pt has lost 26 lb since  May 2017 (15% wt loss x 4 months, significant for time frame). Pt has gained 12 lb since admission, this is most likely fluid related given poor PO intake. Nutrition focused physical exam shows no sign of depletion of muscle mass or body fat.    Labs reviewed. Medications: Zofran tablet every 6 hours, Phenergan PRN  Diet Order:  Diet full liquid Room service appropriate? Yes; Fluid consistency: Thin  Skin:  Reviewed, no issues  Last BM:  9/21  Height:   Ht Readings from Last 1 Encounters:  09/23/15 5\' 3"  (1.6 m)    Weight:   Wt Readings from Last 1 Encounters:  09/26/15 160 lb 0.9 oz (72.6 kg)    Ideal Body Weight:  52.3 kg  BMI:  Body mass index is 28.35 kg/m.  Estimated Nutritional Needs:   Kcal:  1900-2100  Protein:  95-105g  Fluid:  2.1L/day  EDUCATION NEEDS:   No education needs identified at this time  Clayton Bibles, MS, RD, LDN Pager: [phone] After Hours Pager: [phone]

## 2015-09-27 LAB — CBC
HCT: 23.4 % — ABNORMAL LOW (ref 36.0–46.0)
Hemoglobin: 7.5 g/dL — ABNORMAL LOW (ref 12.0–15.0)
MCH: 24.4 pg — AB (ref 26.0–34.0)
MCHC: 32.1 g/dL (ref 30.0–36.0)
MCV: 76.2 fL — AB (ref 78.0–100.0)
PLATELETS: 322 10*3/uL (ref 150–400)
RBC: 3.07 MIL/uL — ABNORMAL LOW (ref 3.87–5.11)
RDW: 19.7 % — AB (ref 11.5–15.5)
WBC: 5.4 10*3/uL (ref 4.0–10.5)

## 2015-09-27 MED ORDER — PROMETHAZINE HCL 25 MG/ML IJ SOLN
12.5000 mg | Freq: Four times a day (QID) | INTRAMUSCULAR | Status: DC | PRN
  Administered 2015-09-27 – 2015-10-01 (×8): 12.5 mg via INTRAVENOUS
  Filled 2015-09-27 (×9): qty 1

## 2015-09-27 MED ORDER — FENTANYL CITRATE (PF) 100 MCG/2ML IJ SOLN
25.0000 ug | Freq: Once | INTRAMUSCULAR | Status: AC
  Administered 2015-09-27: 25 ug via INTRAVENOUS
  Filled 2015-09-27: qty 2

## 2015-09-27 MED ORDER — KETOROLAC TROMETHAMINE 30 MG/ML IJ SOLN
30.0000 mg | Freq: Once | INTRAMUSCULAR | Status: AC
  Administered 2015-09-27: 30 mg via INTRAVENOUS
  Filled 2015-09-27: qty 1

## 2015-09-27 MED ORDER — ENOXAPARIN SODIUM 80 MG/0.8ML ~~LOC~~ SOLN
70.0000 mg | Freq: Two times a day (BID) | SUBCUTANEOUS | Status: DC
  Administered 2015-09-27 – 2015-09-30 (×7): 70 mg via SUBCUTANEOUS
  Filled 2015-09-27 (×7): qty 0.8

## 2015-09-27 MED ORDER — METOCLOPRAMIDE HCL 5 MG/ML IJ SOLN
5.0000 mg | Freq: Four times a day (QID) | INTRAMUSCULAR | Status: DC
  Administered 2015-09-27 – 2015-09-29 (×8): 5 mg via INTRAVENOUS
  Filled 2015-09-27 (×8): qty 2

## 2015-09-27 MED ORDER — METOPROLOL TARTRATE 5 MG/5ML IV SOLN
5.0000 mg | Freq: Four times a day (QID) | INTRAVENOUS | Status: DC
  Administered 2015-09-27 – 2015-09-30 (×12): 5 mg via INTRAVENOUS
  Filled 2015-09-27 (×13): qty 5

## 2015-09-27 MED ORDER — CLONIDINE HCL 0.1 MG/24HR TD PTWK
0.1000 mg | MEDICATED_PATCH | TRANSDERMAL | Status: DC
  Administered 2015-09-27: 0.1 mg via TRANSDERMAL
  Filled 2015-09-27 (×2): qty 1

## 2015-09-27 NOTE — Progress Notes (Signed)
PROGRESS NOTE    [name]  [id number] DOB: 12-24-1955 DOA: 09/23/2015  PCP: [facility]   Brief Narrative:  [name] is a 60 y.o. 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 She presented to the emergency department after suffering 1 day of significant worsening nausea and vomiting with associated chest pain that started this morning. Chest pain made worse with vomiting. No hematemesis. Pain does not radiate. She took an aspirin which did not help with her pain. She has been receiving radiation therapy for her metastatic disease and was told that the radiation could be causing the nausea and vomiting   Subjective: Vomiting recurred when trying to advance to full liquids yesterday. No abdominal pain. Normal BMs.   Assessment & Plan:   Principal Problem:   Nausea & vomiting with fever - CT abd/ pelvis unrevealing, LFTs not suggestive of liver/ gallbladder issues, lipase normal - nausea and vomiting began when she began radiation to the chest and likely is radiation gastritis or esophagitis- she has now completed 3 wks of radiation  - will cont PPI BID & antiemetics- have backed down to clear liquids again today - she has a h/o vomiting from Dilaudid - ? If IV Fentanyl is contributing to her symptoms- will d/c Fentanyl- add Reglan and Phenergan to help resolve symptoms- may need EGD  if symptoms do not resolve with conservative management  Active Problems:  Fever - initially suspected that vomiting and fever was possible related to a gastroenteritis   - temp 102 on 9/22 starting after being admitted to the hospital- it has not recurred - UA negative, blood cultures NGTD - no respiratory symptoms-CT chest, abd, pelvis unrevealing- no diarrhea  Chest pain  - likely due to vomiting and musculoskeletal- started Voltaren  gel- weaned off Fentanyl - troponin negative- EKG unchanged from prior  Metastatic adenocarcinoma of gyn primary, in patient with 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   H/o LLE DVT in 2/17 -unable to tolerate oral mediations today- start Lovenox - oncology following    Essential hypertension - on Clonidine, labetalol - hold HCTZ while she is dehydrated - unable to tolerate orals today- change Oral Clonidine to a patch and Lopressor IV  Cancer related pain - hold oral medications for now and cont PRN Fentanyl which I have weaned today    Anemia of chronic disease - Hb has dropped from 8.2 to 7.6 today- no bleeding noted- follow - possibly dilutional - baseline is between 8-10- last anemia panel in 2/17 consistent with AOCD - hold transfusion for now    Pericardial effusion - noted on CT chest in ER- this is mild and does not need further work up at this point   DVT prophylaxis: apixaban>> Lovenox Code Status: Full code (previously made DNR by Dr Marko Plume but now wants Full code) Family Communication:  Disposition Plan: home in 2-3 days when symptoms resolve Consultants:    Procedures:    Antimicrobials:  Anti-infectives    None       Objective: Vitals:   09/26/15 1445 09/26/15 2150 09/27/15 0500 09/27/15 0525  BP: (!) 174/80 (!) 172/72  (!) 179/72  Pulse: 76  72  72  Resp: 20 20  18   Temp: 98 F (36.7 C) 99.3 F (37.4 C)  98.5 F (36.9 C)  TempSrc: Oral Oral  Oral  SpO2: 97% 98%  95%  Weight:   73.9 kg (162 lb 14.7 oz)   Height:        Intake/Output Summary (Last 24 hours) at 09/27/15 1303 Last data filed at 09/27/15 0600  Gross per 24 hour  Intake             2860 ml  Output             2450 ml  Net              410 ml   Filed Weights    09/25/15 0548 09/26/15 0854 09/27/15 0500  Weight: 69.7 kg (153 lb 10.6 oz) 72.6 kg (160 lb 0.9 oz) 73.9 kg (162 lb 14.7 oz)    Examination: General exam: Appears comfortable  HEENT: PERRLA, oral mucosa moist, no sclera icterus or thrush Respiratory system: Clear to auscultation. Respiratory effort normal. Cardiovascular system: S1 & S2 heard, RRR.  No murmurs  Chest: tender in right chest wall in area of 3rd & 4th rib Gastrointestinal system: Abdomen soft, non-tender today, nondistended. Normal bowel sound. No organomegaly Central nervous system: Alert and oriented. No focal neurological deficits. Extremities: No cyanosis, clubbing or edema Skin: No rashes or ulcers Psychiatry:  Mood & affect appropriate.     Data Reviewed: I have personally reviewed following labs and imaging studies  CBC:  Recent Labs Lab 09/23/15 0858 09/24/15 0417 09/25/15 0357 09/26/15 1240 09/27/15 0938  WBC 14.5* 13.2* 9.6 6.5 5.4  HGB 9.6* 8.2* 7.6* 7.3* 7.5*  HCT 30.6* 25.9* 23.8* 23.1* 23.4*  MCV 76.9* 75.5* 76.0* 77.3* 76.2*  PLT 313 266 252 283 [id number]   Basic Metabolic Panel:  Recent Labs Lab 09/23/15 0858 09/24/15 0417 09/25/15 0357  NA 139 137 138  K 3.0* 4.2 4.0  CL 102 107 110  CO2 25 23 21*  GLUCOSE 170* 119* 96  BUN 10 13 12   CREATININE 0.93 0.98 0.84  CALCIUM 9.2 7.9* 7.6*   GFR: Estimated Creatinine Clearance: 66.9 mL/min (by C-G formula based on SCr of 0.84 mg/dL). Liver Function Tests:  Recent Labs Lab 09/23/15 0858  AST 11*  ALT 8*  ALKPHOS 81  BILITOT 0.6  PROT 7.8  ALBUMIN 3.5    Recent Labs Lab 09/23/15 0858  LIPASE 12   No results for input(s): AMMONIA in the last 168 hours. Coagulation Profile: No results for input(s): INR, PROTIME in the last 168 hours. Cardiac Enzymes: No results for input(s): CKTOTAL, CKMB, CKMBINDEX, TROPONINI in the last 168 hours. BNP (last 3 results) No results for input(s): PROBNP in the last 8760 hours. HbA1C: No results  for input(s): HGBA1C in the last 72 hours. CBG: No results for input(s): GLUCAP in the last 168 hours. Lipid Profile: No results for input(s): CHOL, HDL, LDLCALC, TRIG, CHOLHDL, LDLDIRECT in the last 72 hours. Thyroid Function Tests: No results for input(s): TSH, T4TOTAL, FREET4, T3FREE, THYROIDAB in the last 72 hours. Anemia Panel: No results for input(s): VITAMINB12, FOLATE, FERRITIN, TIBC, IRON, RETICCTPCT in the last 72 hours. Urine analysis:    Component Value Date/Time   COLORURINE AMBER (A) 09/25/2015 0210   APPEARANCEUR CLOUDY (A) 09/25/2015 0210   LABSPEC 1.028 09/25/2015 0210   PHURINE 5.0 09/25/2015 0210   GLUCOSEU NEGATIVE 09/25/2015 0210   HGBUR NEGATIVE 09/25/2015 0210   BILIRUBINUR  SMALL (A) 09/25/2015 0210   KETONESUR NEGATIVE 09/25/2015 0210   PROTEINUR NEGATIVE 09/25/2015 0210   UROBILINOGEN 0.2 03/03/2008 1216   NITRITE NEGATIVE 09/25/2015 0210   LEUKOCYTESUR SMALL (A) 09/25/2015 0210   Sepsis Labs: @LABRCNTIP (procalcitonin:4,lacticidven:4) ) Recent Results (from the past 240 hour(s))  Culture, blood (Routine X 2) w Reflex to ID Panel     Status: None (Preliminary result)   Collection Time: 09/24/15  3:20 PM  Result Value Ref Range Status   Specimen Description BLOOD RIGHT ARM  Final   Special Requests BOTTLES DRAWN AEROBIC AND ANAEROBIC 5CC EA  Final   Culture   Final    NO GROWTH 1 DAY Performed at [facility]    Report Status PENDING  Incomplete  Culture, blood (Routine X 2) w Reflex to ID Panel     Status: None (Preliminary result)   Collection Time: 09/24/15  3:20 PM  Result Value Ref Range Status   Specimen Description BLOOD RIGHT HAND  Final   Special Requests BOTTLES DRAWN AEROBIC AND ANAEROBIC 5CC EA  Final   Culture   Final    NO GROWTH 1 DAY Performed at [facility]    Report Status PENDING  Incomplete         Radiology Studies: No results found.    Scheduled Meds: . cloNIDine  0.1 mg Transdermal Weekly  .  diclofenac sodium  2 g Topical QID  . enoxaparin (LOVENOX) injection  70 mg Subcutaneous Q12H  . feeding supplement (ENSURE ENLIVE)  237 mL Oral BID BM  . metoCLOPramide (REGLAN) injection  5 mg Intravenous Q6H  . metoprolol  5 mg Intravenous Q6H  . ondansetron  4 mg Oral Q6H   Or  . ondansetron (ZOFRAN) IV  4 mg Intravenous Q6H  . pantoprazole  40 mg Oral BID AC   Continuous Infusions: . sodium chloride 100 mL/hr at 09/26/15 1311     LOS: 3 days    Time spent in minutes: 25    [place], MD [facility] Pager: [email or web address] Password [email or web address] 09/27/2015, 1:03 PM

## 2015-09-27 NOTE — Progress Notes (Signed)
Pt continues to complain about chest pain related to vomiting. Paged MD to see if we can get some prn IV pain medication ordered due to patient not being able to do po medication related to the nausea and vomiting.

## 2015-09-27 NOTE — Progress Notes (Signed)
ANTICOAGULATION CONSULT NOTE - Initial Consult  Pharmacy Consult for enoxaparin Indication: VTE treatment  Allergies  Allergen Reactions  . Morphine And Related Itching and Other (See Comments)    Pruritis with morphine - depends on strength of medication  . Dilaudid [Hydromorphone Hcl] Itching and Nausea And Vomiting    Patient Measurements: Height: 5\' 3"  (160 cm) Weight: 162 lb 14.7 oz (73.9 kg) IBW/kg (Calculated) : 52.4 Heparin Dosing Weight:   Vital Signs: Temp: 98.5 F (36.9 C) (09/26 0525) Temp Source: Oral (09/26 0525) BP: 179/72 (09/26 0525) Pulse Rate: 72 (09/26 0525)  Labs:  Recent Labs  09/25/15 0357 09/26/15 1240  HGB 7.6* 7.3*  HCT 23.8* 23.1*  PLT 252 283  CREATININE 0.84  --     Estimated Creatinine Clearance: 66.9 mL/min (by C-G formula based on SCr of 0.84 mg/dL).   Medical History: Past Medical History:  Diagnosis Date  . Chemotherapy-induced neuropathy ([place])   . DVT, lower extremity ([place])    left  02-12-2015  currently on Eliqius  . Endometrial cancer [facility]) oncologist-  dr Marko Plume   02-2015  . 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 Family history of breast cancer   . History of adenomatous polyp of colon    tubulovillious adenoma 09/ 2010  . History of ovarian cyst    complex  . History of radiation therapy 10/21/08-11/23/08 & 12/07/10/13/12/23 2010   ENDOMETRIOID   . History of uterine fibroid   . Hypertension   . Inguinal fluid collection 02/2015   right-drained x2  . Iron deficiency anemia    chronic severe  . Metastasis to lung ([place])   . Metastasis to lymph nodes ([facility])     Medications:  Scheduled:  . cloNIDine  0.1 mg Transdermal Weekly  . diclofenac sodium  2 g Topical QID  . enoxaparin (LOVENOX) injection  70 mg Subcutaneous Q12H  . feeding supplement (ENSURE ENLIVE)  237 mL Oral BID BM  . metoCLOPramide (REGLAN) injection  5 mg Intravenous Q6H  . metoprolol  5 mg Intravenous Q6H  . ondansetron  4 mg Oral  Q6H   Or  . ondansetron (ZOFRAN) IV  4 mg Intravenous Q6H  . pantoprazole  40 mg Oral BID AC    Assessment: Pharmacy is consulted to dose enoxaparin in 60 yo female with hx of DVT on apixaban 5 mg PO BID. However, due to vomiting and poor oral intake, anticoagulation is being transitioned to enoxaparin. Hgb is 7.2, which is likely with anemia of chronic disease.    Goal of Therapy:  Anti-Xa level 0.6-1 units/ml 4hrs after LMWH dose given Monitor platelets by anticoagulation protocol: Yes   Plan:   Lovenox 70 mg SQ q12h  Monitor SCr, Hgb, plt   Monitor for signs and symptoms of bleeding  Royetta Asal, PharmD, BCPS Pager [phone] 09/27/2015 9:39 AM

## 2015-09-28 ENCOUNTER — Inpatient Hospital Stay (HOSPITAL_COMMUNITY): Payer: Medicaid Other

## 2015-09-28 ENCOUNTER — Other Ambulatory Visit (HOSPITAL_COMMUNITY): Payer: Medicaid Other

## 2015-09-28 ENCOUNTER — Ambulatory Visit (HOSPITAL_COMMUNITY)
Admit: 2015-09-28 | Discharge: 2015-09-28 | Disposition: A | Discharge status: Home / Self Care | Payer: Medicaid Other | Attending: Radiation Oncology | Admitting: Radiation Oncology

## 2015-09-28 DIAGNOSIS — Z8543 Personal history of malignant neoplasm of ovary: Secondary | ICD-10-CM

## 2015-09-28 DIAGNOSIS — R1110 Vomiting, unspecified: Secondary | ICD-10-CM

## 2015-09-28 DIAGNOSIS — D649 Anemia, unspecified: Secondary | ICD-10-CM

## 2015-09-28 DIAGNOSIS — C801 Malignant (primary) neoplasm, unspecified: Secondary | ICD-10-CM

## 2015-09-28 DIAGNOSIS — R112 Nausea with vomiting, unspecified: Secondary | ICD-10-CM

## 2015-09-28 DIAGNOSIS — C7931 Secondary malignant neoplasm of brain: Principal | ICD-10-CM

## 2015-09-28 DIAGNOSIS — Z8542 Personal history of malignant neoplasm of other parts of uterus: Secondary | ICD-10-CM

## 2015-09-28 LAB — BASIC METABOLIC PANEL
Anion gap: 9 (ref 5–15)
CHLORIDE: 105 mmol/L (ref 101–111)
CO2: 24 mmol/L (ref 22–32)
CREATININE: 0.72 mg/dL (ref 0.44–1.00)
Calcium: 8.6 mg/dL — ABNORMAL LOW (ref 8.9–10.3)
GFR calc Af Amer: >60 mL/min (ref >60)
GFR calc non Af Amer: >60 mL/min (ref >60)
Glucose, Bld: 99 mg/dL (ref 65–99)
Potassium: 2.6 mmol/L — CL (ref 3.5–5.1)
SODIUM: 138 mmol/L (ref 135–145)

## 2015-09-28 LAB — CBC
HCT: 23.2 % — ABNORMAL LOW (ref 36.0–46.0)
Hemoglobin: 7.5 g/dL — ABNORMAL LOW (ref 12.0–15.0)
MCH: 23.5 pg — AB (ref 26.0–34.0)
MCHC: 32.3 g/dL (ref 30.0–36.0)
MCV: 72.7 fL — AB (ref 78.0–100.0)
PLATELETS: 277 10*3/uL (ref 150–400)
RBC: 3.19 MIL/uL — ABNORMAL LOW (ref 3.87–5.11)
RDW: 19.3 % — AB (ref 11.5–15.5)
WBC: 4.5 10*3/uL (ref 4.0–10.5)

## 2015-09-28 LAB — MAGNESIUM: Magnesium: 1.4 mg/dL — ABNORMAL LOW (ref 1.7–2.4)

## 2015-09-28 MED ORDER — POTASSIUM CHLORIDE 20 MEQ/15ML (10%) PO SOLN
40.0000 meq | Freq: Once | ORAL | Status: AC
  Administered 2015-09-30: 40 meq via ORAL
  Filled 2015-09-28: qty 30

## 2015-09-28 MED ORDER — GADOBENATE DIMEGLUMINE 529 MG/ML IV SOLN
14.0000 mL | Freq: Once | INTRAVENOUS | Status: AC | PRN
  Administered 2015-09-28: 14 mL via INTRAVENOUS

## 2015-09-28 MED ORDER — MORPHINE SULFATE (PF) 2 MG/ML IV SOLN
2.0000 mg | INTRAVENOUS | Status: DC | PRN
  Administered 2015-09-28: 4 mg via INTRAVENOUS
  Administered 2015-09-28 (×2): 2 mg via INTRAVENOUS
  Administered 2015-09-29 – 2015-09-30 (×4): 4 mg via INTRAVENOUS
  Filled 2015-09-28 (×2): qty 1
  Filled 2015-09-28 (×5): qty 2

## 2015-09-28 MED ORDER — HYDRALAZINE HCL 20 MG/ML IJ SOLN
10.0000 mg | Freq: Four times a day (QID) | INTRAMUSCULAR | Status: DC | PRN
  Administered 2015-09-28: 10 mg via INTRAVENOUS
  Filled 2015-09-28 (×2): qty 1

## 2015-09-28 MED ORDER — DEXAMETHASONE 4 MG PO TABS
4.0000 mg | ORAL_TABLET | Freq: Four times a day (QID) | ORAL | Status: DC
  Administered 2015-09-29 – 2015-10-01 (×8): 4 mg via ORAL
  Filled 2015-09-28 (×12): qty 1

## 2015-09-28 MED ORDER — DEXAMETHASONE SODIUM PHOSPHATE 10 MG/ML IJ SOLN
10.0000 mg | Freq: Once | INTRAMUSCULAR | Status: AC
  Administered 2015-09-28: 10 mg via INTRAVENOUS
  Filled 2015-09-28: qty 1

## 2015-09-28 MED ORDER — HYDRALAZINE HCL 50 MG PO TABS
50.0000 mg | ORAL_TABLET | Freq: Four times a day (QID) | ORAL | Status: DC
  Administered 2015-09-29: 50 mg via ORAL
  Filled 2015-09-28 (×2): qty 1

## 2015-09-28 MED ORDER — MAGNESIUM SULFATE 2 GM/50ML IV SOLN
2.0000 g | Freq: Once | INTRAVENOUS | Status: AC
  Administered 2015-09-28: 2 g via INTRAVENOUS
  Filled 2015-09-28: qty 50

## 2015-09-28 MED ORDER — SODIUM CHLORIDE 0.9% FLUSH: 10.0000 mL | INTRAVENOUS | Status: DC | PRN

## 2015-09-28 MED ORDER — HYDRALAZINE HCL 20 MG/ML IJ SOLN
5.0000 mg | Freq: Once | INTRAMUSCULAR | Status: AC
  Administered 2015-09-28: 5 mg via INTRAVENOUS
  Filled 2015-09-28: qty 1

## 2015-09-28 MED ORDER — LEVETIRACETAM 500 MG PO TABS
500.0000 mg | ORAL_TABLET | Freq: Two times a day (BID) | ORAL | Status: DC
  Administered 2015-09-29 – 2015-10-01 (×5): 500 mg via ORAL
  Filled 2015-09-28 (×6): qty 1

## 2015-09-28 MED ORDER — POTASSIUM CHLORIDE 10 MEQ/100ML IV SOLN
10.0000 meq | INTRAVENOUS | Status: AC
  Administered 2015-09-28 (×6): 10 meq via INTRAVENOUS
  Filled 2015-09-28 (×6): qty 100

## 2015-09-28 NOTE — Progress Notes (Signed)
Paged Dr. [facility] to notify of patient having potassium of 2.6 this am. Critical was called from lab.

## 2015-09-28 NOTE — Progress Notes (Addendum)
Called to room at 0415 by CNA. CNA was making rounds when the husband of the patient stated that pt had fallen and he had gotten her up off of the floor and put her on the bedside commode. Pt stated that she woke up disoriented and she stood up to go use the bathroom and she fell by the right side of her bed. She did hit the back of her head however there is no open area noted and no hematoma noted to back of head. Pt was sitting on the bedside commode when I arrived to the room to assess her. After getting the patient back to bed, vital signs were taken. Assessment was completed. No injury noted. No pain noted related to fall. No ROM changes noted. Pt alert and oriented at this time. Vitals 98.0, hr 80, rr 20, bp 204/98, 02 98% RA. Paged and notified K.Schorr. Will repeat BP and notify md of findings. pts husband will remain at the bedside. Pt has yellow armband and yellow socks. Bed alarm set with call bell in reach.

## 2015-09-28 NOTE — Progress Notes (Signed)
CRITICAL VALUE ALERT  Critical value received:  Potassium 2.6  Date of notification:  09/28/2015  Time of notification:  0645  Critical value read back:yes  Nurse who received alert: Sherene Sires  MD notified (1st page):  Raliegh Ip Schorr  Time of first page: [phone]  MD notified (2nd page):  Time of second page:  Responding MD:  Time MD responded:

## 2015-09-28 NOTE — Progress Notes (Signed)
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 Lost to follow up after 11-2010 until presented with metastatic gyn cancer 02-2015. Initial response to [place] taxotere, then [place] reaction and progressive peripheral neuropathy with taxane. Extensive progression including left chest, gemzar x 1 8-10 -17 then held for radiation  to left lung.    Currently on Letrozole now only for a treatment for advanced malignancy. She was last seen by Dr. Marko Plume on 09/06/2015.  She was hospitalized on 09/23/2015 complaints of nausea and vomiting. She has been receiving supportive management but her symptoms did not improve and underwent CT scan of the brain which showed cerebellar edema surrounding 83.5 cm left cerebellar mass with mass effect into the fourth ventricle.  She was started on dexamethasone and a referral to radiation oncology has already been made. We are asked to comment about these findings for medical oncology standpoint.  Clinically, she continues to report headaches and dizziness associated with nausea and vomiting. No seizure activity noted. She did not report any neurological deficits.  Objective:  Vital signs in last 24 hours: Temp:  [98 F (36.7 C)-98.9 F (37.2 C)] 98.9 F (37.2 C) (09/27 1051) Pulse Rate:  [61-92] 87 (09/27 1051) Resp:  [20] 20 (09/27 1051) BP: (197-205)/(62-98) 205/90 (09/27 1128) SpO2:  [98 %-100 %] 99 % (09/27 1051) Weight:  [154 lb 15.7 oz (70.3 kg)] 154 lb 15.7 oz (70.3 kg) (09/27 0430) Weight change: -5 lb 1.1 oz (-2.3 kg) Last BM Date: 09/26/15  Intake/Output from previous day: 09/26 0701 - 09/27 0700 In: 300 [P.O.:300] Out: 800 [Urine:800] Chronically ill-appearing woman appeared in mild distress. Mouth:  mucous membranes moist, pharynx normal without lesions Resp: clear to auscultation bilaterally Cardio: regular rate and rhythm, S1, S2 normal, no murmur, click, rub or gallop GI: soft, non-tender; bowel sounds normal; no masses,  no organomegaly Extremities: extremities normal, atraumatic, no cyanosis or edema  Portacath without erythema  Lab Results:  Recent Labs  09/27/15 0938 09/28/15 0550  WBC 5.4 4.5  HGB 7.5* 7.5*  HCT 23.4* 23.2*  PLT 322 277    BMET  Recent Labs  09/28/15 0550  NA 138  K 2.6*  CL 105  CO2 24  GLUCOSE 99  BUN <5*  CREATININE 0.72  CALCIUM 8.6*    Studies/Results: Ct Head Wo Contrast  Result Date: 09/28/2015 CLINICAL DATA:  60 year old female status post fall in the middle of the night. On Coumadin. Personal history of endometrial carcinoma, and large left lung mass in July. Initial encounter. EXAM: CT HEAD WITHOUT CONTRAST TECHNIQUE: Contiguous axial images were obtained from the base of the skull through the vertex without intravenous contrast. COMPARISON:  PET-CT 08/01/2015. FINDINGS: Brain: Posterior fossa mass effect and abnormal hypodensity in the central and left cerebellar hemisphere encompassing an area of about 3.5 cm (series 2, image 8). Mass effect on the fourth ventricle, but no ventriculomegaly. Crowding of the pre pontine cistern which remains patent. Edema mildly crosses midline to the central right cerebellum. Supratentorial gray-white matter differentiation appears normal. No supratentorial mass effect. Partially empty sella incidentally noted. No acute intracranial hemorrhage identified. No cortically based acute infarct identified. Vascular: Mild Calcified atherosclerosis at the skull base. Skull: left posterior skull exostosis incidentally noted. No acute or suspicious osseous lesion identified.  Sinuses/Orbits: Clear. Other: Negative orbit and scalp soft tissues. IMPRESSION: 1. Cerebellar edema centrally on the left with suggestion of  an underlying 3.5 cm left cerebellar mass. Mass effect on the fourth ventricle but no ventriculomegaly or loss of basilar cistern patency. 2. Cerebellar metastatic disease favored in this clinical setting. Follow-up Brain MRI without and with contrast would characterize further. 3. No other acute or metastatic intracranial abnormality identified. Electronically Signed   By: Genevie Ann M.D.   On: 09/28/2015 10:35    Medications: I have reviewed the patient's current medications.  Assessment/Plan:   60 year old woman with the following issues:  1. Metastatic malignancy arising from a GYN etiology. She is status post multiple systemic therapies in the past and currently on letrozole. Further systemic therapy as per the discretion of Dr. Marko Plume.  2. Cerebellar metastasis presenting with nausea, vomiting and dizziness. I agree with the current managements with dexamethasone and radiation oncology evaluation. She appears to be clinically stable although quite symptomatic.   I anticipate improvement in her symptoms with steroids therapy. I agree with obtaining MRI with and without contrast for further characterization of any other CNS lesions.  3. Anemia: Appears to be chronic in nature and not symptomatic at this time. It is reasonable to withhold any transfusions.  4. CODE STATUS: This was discussed with Dr. Marko Plume in last visit on 09/06/2015 the patient wishes to be DO NOT RESUSCITATE. She is currently full code and this issue needs to be addressed while she is hospitalized.  I appreciate the care and the support of the hospitalist team. Dr. Marko Plume will follow from medical oncology on 09/29/2015. Please call with questions regarding this pleasant woman.   LOS: 4 days   [name] 09/28/2015, 2:11 PM

## 2015-09-28 NOTE — Progress Notes (Signed)
Critical value sticker created for potassium of 2.6. md Schorr paged and notified. No orders at this pint. Will pass on to day shift nurse to mention to day MD if orders are not received.

## 2015-09-28 NOTE — Consult Note (Signed)
[place]         [phone]) [phone] ________________________________  Initial inpatient Consultation  Name: [name] MRN: [id number]  Date: 09/28/15  DOB: 15-Aug-1955  [id number] [facility]  No ref. provider found   REFERRING PHYSICIAN: No ref. provider found  DIAGNOSIS: The primary encounter diagnosis was Non-intractable vomiting with nausea, vomiting of unspecified type. Diagnoses of Chest pain, unspecified chest pain type, Fall, and Brain lesion were also pertinent to this visit.    ICD-9-CM ICD-10-CM   1. Non-intractable vomiting with nausea, vomiting of unspecified type 787.01 R11.2   2. Chest pain, unspecified chest pain type 786.50 R07.9   3. Fall E888.9 W19.XXXA CT HEAD WO CONTRAST     CT HEAD WO CONTRAST  4. Brain lesion 348.89 G93.9 MR Brain W Wo Contrast     MR Brain W Wo Contrast    HISTORY OF PRESENT ILLNESS: [name] is a 60 y.o. female seen at the request of Dr. Waldron Labs for a newly noted metastatic brain lesion. Ms. 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 She was diagnosed in 2010. She has received TAH/BSO and debulking followed by adjuvant carboplatin and taxol in 2010. She was lost to follow up until February 2017 when she was found to have recurrence in the left psoas, retroperitoneal nodes, lumbar spine and pulmonary nodules. She resumed platin and taxane chemotherapy and progression led to a change in regimens to Gemzar. This was held due to significant progression in the chest. She recently completed radiotherapy to the chest with Dr. Sondra Come. She completed this on 09/15/15. She presented on 09/23/15 complaining of nausea and vomiting. Symptoms did not improve and she had also noted dizziness and falls at home in the last 4 weeks. She fell yesterday which prompted a CT brain overnight revealing a 3.5 cm left cerebellar mass with mass effect  over the fourth ventricle. She was started on Dexamethasone, and is now on 4 mg every 6 hours.    PREVIOUS RADIATION THERAPY: Yes   08/24/15-09/15/15: 37.5 Gy to the left lung  PAST MEDICAL HISTORY:  Past Medical History:  Diagnosis Date  . Chemotherapy-induced neuropathy ([place])   . DVT, lower extremity ([place])    left  02-12-2015  currently on Eliqius  . Endometrial cancer [facility]) oncologist-  dr Marko Plume   02-2015  . 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 Family history of breast cancer   . History of adenomatous polyp of colon    tubulovillious adenoma 09/ 2010  . History of ovarian cyst    complex  . History of radiation therapy 10/21/08-11/23/08 & 12/07/10/13/12/23 2010   ENDOMETRIOID   . History of uterine fibroid   . Hypertension   . Inguinal fluid collection 02/2015   right-drained x2  . Iron deficiency anemia    chronic severe  . Metastasis to lung ([place])   . Metastasis to lymph nodes ([place])       PAST SURGICAL HISTORY: Past Surgical History:  Procedure Laterality Date  . ABDOMINAL HYSTERECTOMY  2010  . APPLICATION OF WOUND VAC Right 05/12/2015   Procedure: APPLICATION OF WOUND VAC;  Surgeon: Hall Busing, MD;  Location: [facility];  Service: General;  Laterality: Right;  . COLONOSCOPY W/ POLYPECTOMY  09-27-2008  . EXPLORATORY LAPAROTOMY /  TOTAL ABDOMINAL HYSTERECTOMY/ BILATERAL SALPINOOPHORECTOMY/  PARTIAL OMENTECTOMY  03-18-2008  . INGUINAL HERNIA REPAIR  Right 05/12/2015   Procedure: INCISION AND DRAINGE RIGHT INGUINAL FLUID COLLECTION ;  Surgeon: Hall Busing, MD;  Location: [facility];  Service: General;  Laterality: Right;  . IR GENERIC HISTORICAL  08/10/2015   IR US GUIDE VASC ACCESS RIGHT 08/10/2015 Jacqulynn Cadet, MD [facility]-INTERV RAD  . IR GENERIC HISTORICAL  08/10/2015   IR FLUORO GUIDE CV LINE RIGHT 08/10/2015 Jacqulynn Cadet, MD [facility]INTER[facility]  . porta cath  06/2010   removal  . TUBAL LIGATION  1990's  . UMBILICAL HERNIA  REPAIR     infant    FAMILY HISTORY:  Family History  Problem Relation Age of Onset  . Bone cancer Paternal Grandmother     OSSEOUS METASTASIS  . Heart disease Father   . Heart disease Sister   . Bone cancer Maternal Grandmother 75  . Leukemia Maternal Grandfather 57  . Liver cancer Maternal Uncle   . Breast cancer Cousin 46    maternal first cousin    SOCIAL HISTORY:  Social History   Social History  . Marital status: Married    Spouse name: N/A  . Number of children: 3  . Years of education: N/A   Occupational History  . Not on file.   Social History Main Topics  . Smoking status: Never Smoker  . Smokeless tobacco: Never Used  . Alcohol use No  . Drug use: No  . Sexual activity: Not on file   Other Topics Concern  . Not on file   Social History Narrative  . No narrative on file    ALLERGIES: Morphine and related and Dilaudid [hydromorphone hcl]  MEDICATIONS:  Current Facility-Administered Medications  Medication Dose Route Frequency Provider Last Rate Last Dose  . 0.9 %  sodium chloride infusion   Intravenous Continuous Mariel Aloe, MD 100 mL/hr at 09/26/15 1311    . cloNIDine (CATAPRES - Dosed in mg/24 hr) patch 0.1 mg  0.1 mg Transdermal Weekly Debbe Odea, MD   0.1 mg at 09/27/15 1107  . dexamethasone (DECADRON) tablet 4 mg  4 mg Oral Q6H Dawood S Elgergawy, MD      . diclofenac sodium (VOLTAREN) 1 % transdermal gel 2 g  2 g Topical QID Debbe Odea, MD   2 g at 09/28/15 1456  . diphenhydrAMINE (BENADRYL) injection 12.5 mg  12.5 mg Intravenous Q6H PRN Mariel Aloe, MD   12.5 mg at 09/27/15 2058  . enoxaparin (LOVENOX) injection 70 mg  70 mg Subcutaneous Q12H Nikola Glogovac, RPH   70 mg at 09/28/15 1345  . feeding supplement (ENSURE ENLIVE) (ENSURE ENLIVE) liquid 237 mL  237 mL Oral BID BM Saima Rizwan, MD   237 mL at 09/26/15 1729  . hydrALAZINE (APRESOLINE) injection 10 mg  10 mg Intravenous Q6H PRN Albertine Patricia, MD   10 mg at 09/28/15 1128    . hydrALAZINE (APRESOLINE) tablet 50 mg  50 mg Oral Q6H Dawood S Elgergawy, MD      . levETIRAcetam (KEPPRA) tablet 500 mg  500 mg Oral BID Silver Huguenin Elgergawy, MD      . LORazepam (ATIVAN) injection 0.5 mg  0.5 mg Intravenous Q6H PRN Debbe Odea, MD   0.5 mg at 09/27/15 1128  . magnesium sulfate IVPB 2 g 50 mL  2 g Intravenous Once Albertine Patricia, MD      . metoCLOPramide (REGLAN) injection 5 mg  5 mg Intravenous Q6H Saima Rizwan, MD   5 mg at 09/28/15 1344  .  metoprolol (LOPRESSOR) injection 5 mg  5 mg Intravenous Q6H Debbe Odea, MD   5 mg at 09/28/15 1343  . morphine 2 MG/ML injection 2-4 mg  2-4 mg Intravenous Q3H PRN Albertine Patricia, MD   2 mg at 09/28/15 1502  . ondansetron (ZOFRAN) tablet 4 mg  4 mg Oral Q6H Saima Rizwan, MD   4 mg at 09/26/15 [id number]   Or  . ondansetron (ZOFRAN) injection 4 mg  4 mg Intravenous Q6H Debbe Odea, MD   4 mg at 09/28/15 1345  . pantoprazole (PROTONIX) EC tablet 40 mg  40 mg Oral BID AC Debbe Odea, MD   40 mg at 09/26/15 1729  . polyethylene glycol (MIRALAX / GLYCOLAX) packet 17 g  17 g Oral BID PRN Mariel Aloe, MD      . potassium chloride 20 MEQ/15ML (10%) solution 40 mEq  40 mEq Oral Once Albertine Patricia, MD      . promethazine (PHENERGAN) injection 12.5 mg  12.5 mg Intravenous Q6H PRN Debbe Odea, MD   12.5 mg at 09/27/15 2248  . sodium chloride flush (NS) 0.9 % injection 10-40 mL  10-40 mL Intracatheter PRN Debbe Odea, MD        REVIEW OF SYSTEMS:  On review of systems, the patient reports that she  is doing well overall despite this new finding. She continues to have nausea and denies vomiting. She denies any chest pain, shortness of breath, cough, fevers, chills, night sweats, unintended weight changes. She denies headaches, blurred vision, double vision, or auditory disturbance denies any bowel or bladder disturbances, and denies abdominal pain. She denies any new musculoskeletal or joint aches or pains. A complete review of systems is  obtained and is otherwise negative.    PHYSICAL EXAM:  height is 5\' 3"  (1.6 m) and weight is 154 lb 15.7 oz (70.3 kg). Her oral temperature is 98.9 F (37.2 C). Her blood pressure is 172/82 (abnormal) and her pulse is 110 (abnormal). Her respiration is 20 and oxygen saturation is 100%.   Pain Scale 0/10 In general this is a well appearing African American femela in no acute distress. She is alert and oriented x4 and appropriate throughout the examination. HEENT reveals that the patient is normocephalic, atraumatic. EOMs are intact. PERRLA.  She is intact with cranial nerves II-XII intact. Motor strength is intact bilaterally a 5/5 in upper and lower extremities. Grip strength is equilateral. Skin is intact without any evidence of gross lesions. Cardiovascular exam reveals a regular rate and rhythm, no clicks rubs or murmurs are auscultated. Chest is clear to auscultation bilaterally. Lymphatic assessment is performed and does not reveal any adenopathy in the cervical, supraclavicular, axillary, or inguinal chains. Abdomen has active bowel sounds in all quadrants and is intact. The abdomen is soft, non tender, non distended. Lower extremities are negative for pretibial pitting edema, deep calf tenderness, cyanosis or clubbing.   KPS = 70  100 - Normal; no complaints; no evidence of disease. 90   - Able to carry on normal activity; minor signs or symptoms of disease. 80   - Normal activity with effort; some signs or symptoms of disease. 68   - Cares for self; unable to carry on normal activity or to do active work. 60   - Requires occasional assistance, but is able to care for most of his personal needs. 50   - Requires considerable assistance and frequent medical care. 24   - Disabled; requires special care and assistance.  67   - Severely disabled; hospital admission is indicated although death not imminent. 55   - Very sick; hospital admission necessary; active supportive treatment necessary. 10    - Moribund; fatal processes progressing rapidly. 0     - Dead  Karnofsky DA, Abelmann [place], Craver LS and Burchenal [facility] [phone]) The use of the nitrogen mustards in the palliative treatment of carcinoma: with particular reference to bronchogenic carcinoma Cancer 1 634-56  LABORATORY DATA:  Lab Results  Component Value Date   WBC 4.5 09/28/2015   HGB 7.5 (L) 09/28/2015   HCT 23.2 (L) 09/28/2015   MCV 72.7 (L) 09/28/2015   PLT 277 09/28/2015   Lab Results  Component Value Date   NA 138 09/28/2015   K 2.6 (LL) 09/28/2015   CL 105 09/28/2015   CO2 24 09/28/2015   Lab Results  Component Value Date   ALT 8 (L) 09/23/2015   AST 11 (L) 09/23/2015   ALKPHOS 81 09/23/2015   BILITOT 0.6 09/23/2015     RADIOGRAPHY: Dg Chest 2 View  Result Date: 09/23/2015 CLINICAL DATA:  Mid chest pain hand shortness of breath since 6 a.m. today. EXAM: CHEST  2 VIEW COMPARISON:  09/12/2015 FINDINGS: Left upper lobe mass again noted not significantly changed. Right Port-A-Cath is unchanged. Mild cardiomegaly. No confluent opacity on the right. No effusions or acute bony abnormality. IMPRESSION: Stable left upper lobe mass. Mild cardiomegaly. Electronically Signed   By: Rolm Baptise M.D.   On: 09/23/2015 10:56   Ct Head Wo Contrast  Result Date: 09/28/2015 CLINICAL DATA:  60 year old female status post fall in the middle of the night. On Coumadin. Personal history of endometrial carcinoma, and large left lung mass in July. Initial encounter. EXAM: CT HEAD WITHOUT CONTRAST TECHNIQUE: Contiguous axial images were obtained from the base of the skull through the vertex without intravenous contrast. COMPARISON:  PET-CT 08/01/2015. FINDINGS: Brain: Posterior fossa mass effect and abnormal hypodensity in the central and left cerebellar hemisphere encompassing an area of about 3.5 cm (series 2, image 8). Mass effect on the fourth ventricle, but no ventriculomegaly. Crowding of the pre pontine cistern which remains patent.  Edema mildly crosses midline to the central right cerebellum. Supratentorial gray-white matter differentiation appears normal. No supratentorial mass effect. Partially empty sella incidentally noted. No acute intracranial hemorrhage identified. No cortically based acute infarct identified. Vascular: Mild Calcified atherosclerosis at the skull base. Skull: left posterior skull exostosis incidentally noted. No acute or suspicious osseous lesion identified. Sinuses/Orbits: Clear. Other: Negative orbit and scalp soft tissues. IMPRESSION: 1. Cerebellar edema centrally on the left with suggestion of an underlying 3.5 cm left cerebellar mass. Mass effect on the fourth ventricle but no ventriculomegaly or loss of basilar cistern patency. 2. Cerebellar metastatic disease favored in this clinical setting. Follow-up Brain MRI without and with contrast would characterize further. 3. No other acute or metastatic intracranial abnormality identified. Electronically Signed   By: Genevie Ann M.D.   On: 09/28/2015 10:35   Ct Chest W Contrast  Result Date: 09/23/2015 CLINICAL DATA:  History of endometrial carcinoma with metastatic disease and chest pain EXAM: CT CHEST, ABDOMEN, AND PELVIS WITH CONTRAST TECHNIQUE: Multidetector CT imaging of the chest, abdomen and pelvis was performed following the standard protocol during bolus administration of intravenous contrast. CONTRAST:  17mL ISOVUE-300 IOPAMIDOL (ISOVUE-300) INJECTION 61% COMPARISON:  06/14/2015, 08/01/2015 FINDINGS: CT CHEST FINDINGS Cardiovascular: Bovine arch anomaly is noted. The thoracic aorta show some calcific changes without evidence of dissection or aneurysmal  dilatation. The pulmonary artery as visualized is within normal limits. Timing was not optimum for pulmonary artery evaluation. Mild pericardial effusion is noted new from the prior exam. Mediastinum/Nodes: The thoracic inlet is within normal limits. There is again noted a 80 somewhat necrotic mass which abuts  the mediastinum on the left. It measures approximately 7.2 x 6.6 cm in greatest transverse and AP dimensions respectively. This is a slight decrease when compared with the prior exam. Some mediastinal adenopathy is noted in the aorticopulmonary window measuring 3.0 cm in short axis. This also demonstrates central decreased attenuation consistent with necrosis. Lungs/Pleura: The lungs are well aerated bilaterally and again demonstrate a large left upper lobe mass lesion which abuts the major fissure. It measures approximately 5.0 by 4.0 cm in greatest dimension. This is also decreased in size from prior exam measuring 5.5 x 5.1 cm. Left lower lobe nodule with spiculation is noted and also reduced in size now measuring approximately 19 mm in greatest dimension decreased from the prior exam measuring 26 mm in greatest dimension. The right lung is well aerated and demonstrates some dependent atelectatic changes. A tiny nodule is noted in the lateral aspect of the right upper lobe also decreased in size from the prior PET-CT. A second right lower lobe nodule is seen measuring approximately 9 mm. It demonstrates some tenting of the adjacent major fissure. It is slightly decreased in size from the prior exam at which time it measured 11 mm. Small left pleural effusion is noted. Musculoskeletal: No acute bony abnormality is noted. CT ABDOMEN PELVIS FINDINGS Hepatobiliary: Liver demonstrates some hypodensities adjacent to the falciform ligament consistent with focal fatty infiltration. The gallbladder is well distended. Pancreas: Within normal limits. Spleen: Within normal limits. Adrenals/Urinary Tract: The adrenal glands are unremarkable. The kidneys demonstrate renal cystic change on the right as well as a nonobstructing renal stone stable from the prior exam. The bladder is well distended without focal abnormality. Stomach/Bowel: Stomach is within normal limits. No evidence of bowel wall thickening, distention, or  inflammatory changes. Appendix appears normal. Vascular/Lymphatic: Aortic atherosclerosis. The previously seen left pelvic lymphadenopathy in the iliac chain has increased in size from the prior exam measuring 3.7 x 2.6 cm. It previously measured 3 x 2 cm. Persistent soft tissue changes are noted in the right inguinal region but slightly decreased when compared with the prior exam. No central necrotic component is noted. Stable left psoas mass is noted. Stable left periaortic lymph node is noted as well. Reproductive: Status post hysterectomy. No adnexal masses. Musculoskeletal: No acute bony abnormality is noted. IMPRESSION: Decrease in some of the pulmonary metastatic lesions as described above. The mass lesion abutting the mediastinum on the left has decreased in size from the prior exam. New mild pericardial effusion. Increase in size in a pelvic lymphadenopathy in the iliac chain. The left psoas mass and periaortic lymph node as well as changes in the right inguinal region are relatively stable as described. No new focal abnormality is seen. Electronically Signed   By: Inez Catalina M.D.   On: 09/23/2015 11:56   Ct Abdomen Pelvis W Contrast  Result Date: 09/23/2015 CLINICAL DATA:  History of endometrial carcinoma with metastatic disease and chest pain EXAM: CT CHEST, ABDOMEN, AND PELVIS WITH CONTRAST TECHNIQUE: Multidetector CT imaging of the chest, abdomen and pelvis was performed following the standard protocol during bolus administration of intravenous contrast. CONTRAST:  146mL ISOVUE-300 IOPAMIDOL (ISOVUE-300) INJECTION 61% COMPARISON:  06/14/2015, 08/01/2015 FINDINGS: CT CHEST FINDINGS Cardiovascular: Bovine arch  anomaly is noted. The thoracic aorta show some calcific changes without evidence of dissection or aneurysmal dilatation. The pulmonary artery as visualized is within normal limits. Timing was not optimum for pulmonary artery evaluation. Mild pericardial effusion is noted new from the prior  exam. Mediastinum/Nodes: The thoracic inlet is within normal limits. There is again noted a 80 somewhat necrotic mass which abuts the mediastinum on the left. It measures approximately 7.2 x 6.6 cm in greatest transverse and AP dimensions respectively. This is a slight decrease when compared with the prior exam. Some mediastinal adenopathy is noted in the aorticopulmonary window measuring 3.0 cm in short axis. This also demonstrates central decreased attenuation consistent with necrosis. Lungs/Pleura: The lungs are well aerated bilaterally and again demonstrate a large left upper lobe mass lesion which abuts the major fissure. It measures approximately 5.0 by 4.0 cm in greatest dimension. This is also decreased in size from prior exam measuring 5.5 x 5.1 cm. Left lower lobe nodule with spiculation is noted and also reduced in size now measuring approximately 19 mm in greatest dimension decreased from the prior exam measuring 26 mm in greatest dimension. The right lung is well aerated and demonstrates some dependent atelectatic changes. A tiny nodule is noted in the lateral aspect of the right upper lobe also decreased in size from the prior PET-CT. A second right lower lobe nodule is seen measuring approximately 9 mm. It demonstrates some tenting of the adjacent major fissure. It is slightly decreased in size from the prior exam at which time it measured 11 mm. Small left pleural effusion is noted. Musculoskeletal: No acute bony abnormality is noted. CT ABDOMEN PELVIS FINDINGS Hepatobiliary: Liver demonstrates some hypodensities adjacent to the falciform ligament consistent with focal fatty infiltration. The gallbladder is well distended. Pancreas: Within normal limits. Spleen: Within normal limits. Adrenals/Urinary Tract: The adrenal glands are unremarkable. The kidneys demonstrate renal cystic change on the right as well as a nonobstructing renal stone stable from the prior exam. The bladder is well distended  without focal abnormality. Stomach/Bowel: Stomach is within normal limits. No evidence of bowel wall thickening, distention, or inflammatory changes. Appendix appears normal. Vascular/Lymphatic: Aortic atherosclerosis. The previously seen left pelvic lymphadenopathy in the iliac chain has increased in size from the prior exam measuring 3.7 x 2.6 cm. It previously measured 3 x 2 cm. Persistent soft tissue changes are noted in the right inguinal region but slightly decreased when compared with the prior exam. No central necrotic component is noted. Stable left psoas mass is noted. Stable left periaortic lymph node is noted as well. Reproductive: Status post hysterectomy. No adnexal masses. Musculoskeletal: No acute bony abnormality is noted. IMPRESSION: Decrease in some of the pulmonary metastatic lesions as described above. The mass lesion abutting the mediastinum on the left has decreased in size from the prior exam. New mild pericardial effusion. Increase in size in a pelvic lymphadenopathy in the iliac chain. The left psoas mass and periaortic lymph node as well as changes in the right inguinal region are relatively stable as described. No new focal abnormality is seen. Electronically Signed   By: Inez Catalina M.D.   On: 09/23/2015 11:56   Dg Abdomen Acute W/chest  Result Date: 09/12/2015 CLINICAL DATA:  Endometrial cancer with lung metastases EXAM: DG ABDOMEN ACUTE W/ 1V CHEST COMPARISON:  PET-CT dated 08/01/2015 FINDINGS: Left upper lobe masses, better evaluated on recent PET-CT. Mild blunting of the left costophrenic angle. Right lung is clear. No pneumothorax. Cardiomegaly. Right chest power  port terminates in the lower SVC. Nonobstructive bowel gas pattern. No evidence of free air on the lateral decubitus view. Visualized osseous structures are within normal limits. IMPRESSION: Left upper lobe masses, better evaluated on recent PET-CT. No evidence of small bowel obstruction or free air. Electronically  Signed   By: Julian Hy M.D.   On: 09/12/2015 17:01      IMPRESSION/PLAN: 1. Metastatic progressive synchronous stage I endometrial and ovarian cancers. Dr. Tammi Klippel has reviewed the patient's CT scan and discussed this with Dr. Vertell Limber in neurosurgery. We would recommend a 3T MRI to be performed at [facility] in lieu of her being a possible candidate for preoperative SRS treatment to the brain. We will plan a CT simulation as well possibly tomorrow or Friday, and agree with continued steroids. If the patient is able to medically disposition for discharge, we would plan on SRS treatment as an outpatient, with most likely treating her toward the middle of next week. The patient and her daughters are in agreement. We discussed the risks, benefits, short, and long term effects of radiotherapy either with whole brain versus SRS approach. We will continue to move forward with MRI and discuss further once the results are available.  2. Nausea. I think the patient's symptoms are most likely due to her brain tumor. She will continue Dexametasone at 4mg  QID, and we will taper once we have more definitive plans for her treatment.     Carola Rhine, PAC

## 2015-09-28 NOTE — Progress Notes (Addendum)
PROGRESS NOTE    [name]  [id number] DOB: 03/07/1955 DOA: 09/23/2015  PCP: [facility]   Brief Narrative:  [name] is a 60 y.o. 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 She presented to the emergency department after suffering 1 day of significant worsening nausea and vomiting with associated chest pain that started this morning. Chest pain made worse with vomiting. No hematemesis. Pain does not radiate. She took an aspirin which did not help with her pain. She has been receiving radiation therapy for her metastatic disease and was told that the radiation could be causing the nausea and vomiting, CT head obtained secondary to fall during hospital stay significant for metastasis.   Subjective: Denies any further vomiting, but still complains of nausea and vertigo , at full overnight .   Assessment & Plan:      Nausea & vomiting with fever - CT abd/ pelvis unrevealing, LFTs not suggestive of liver/ gallbladder issues, lipase normal - nausea and vomiting began when she began radiation to the chest and likely is radiation gastritis or esophagitis- she has now completed 3 wks of radiation  - will cont PPI BID & antiemetics, currently on full liquid diet, poor intake, but reported this is mainly to poor appetite, and that related to nausea or vomiting.  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 - Discussed with oncology Dr. Osker Mason, Dr Gwyneth Revels to see tomorrow.    H/o LLE DVT in 2/17 -unable to tolerate oral mediations today- start Lovenox - oncology following    Essential hypertension - on Clonidine, labetalol - hold HCTZ while she is dehydrated - unable to tolerate orals today- change Oral Clonidine to a patch and Lopressor IV  Cancer related pain - hold oral medications for now and cont PRN Fentanyl which I have weaned today    Anemia of chronic disease - Hb has dropped from 8.2 to 7.6 today- no bleeding noted- follow - possibly dilutional - baseline is between 8-10- last anemia panel in 2/17 consistent with AOCD - hold transfusion for now    Pericardial effusion - noted on CT chest in ER- this is mild and does not need further work up at this point   DVT prophylaxis: apixaban>> Lovenox Code Status: Full code (previously made DNR by Dr Marko Plume but now wants Full code) Family Communication: Both daughters at bedside Disposition Plan: Pending further workup Consultants:   Radiation oncology Procedures:    Antimicrobials:  Anti-infectives    None       Objective: Vitals:  09/28/15 0430 09/28/15 0515 09/28/15 1051 09/28/15 1128  BP: (!) 204/98 (!) 199/62 (!) 205/90 (!) 205/90  Pulse: 80 61 87   Resp: 20  20   Temp: 98 F (36.7 C)  98.9 F (37.2 C)   TempSrc: Oral  Oral   SpO2: 98%  99%   Weight: 70.3 kg (154 lb 15.7 oz)     Height:        Intake/Output Summary (Last 24 hours) at 09/28/15 1338 Last data  filed at 09/28/15 0926  Gross per 24 hour  Intake                0 ml  Output             1700 ml  Net            -1700 ml   Filed Weights   09/26/15 0854 09/27/15 0500 09/28/15 0430  Weight: 72.6 kg (160 lb 0.9 oz) 73.9 kg (162 lb 14.7 oz) 70.3 kg (154 lb 15.7 oz)    Examination: General exam: Appears comfortable  HEENT: PERRLA, oral mucosa moist, no sclera icterus or thrush Respiratory system: Clear to auscultation. Respiratory effort normal. Cardiovascular system: S1 & S2 heard, RRR.  No murmurs , left side chest tenderness to palpation Chest: tender in right chest wall in area of 3rd & 4th rib Gastrointestinal system: Abdomen soft, non-tender , nondistended. Normal bowel sound. No organomegaly Central nervous system: Alert and oriented. No focal neurological deficits. Extremities: No cyanosis, clubbing or edema Skin: No rashes or ulcers Psychiatry:  Mood & affect appropriate.     Data Reviewed: I have personally reviewed following labs and imaging studies  CBC:  Recent Labs Lab 09/24/15 0417 09/25/15 0357 09/26/15 1240 09/27/15 0938 09/28/15 0550  WBC 13.2* 9.6 6.5 5.4 4.5  HGB 8.2* 7.6* 7.3* 7.5* 7.5*  HCT 25.9* 23.8* 23.1* 23.4* 23.2*  MCV 75.5* 76.0* 77.3* 76.2* 72.7*  PLT 266 252 283 322 [id number]   Basic Metabolic Panel:  Recent Labs Lab 09/23/15 0858 09/24/15 0417 09/25/15 0357 09/28/15 0550  NA 139 137 138 138  K 3.0* 4.2 4.0 2.6*  CL 102 107 110 105  CO2 25 23 21* 24  GLUCOSE 170* 119* 96 99  BUN 10 13 12  <5*  CREATININE 0.93 0.98 0.84 0.72  CALCIUM 9.2 7.9* 7.6* 8.6*  MG  --   --   --  1.4*   GFR: Estimated Creatinine Clearance: 68.6 mL/min (by C-G formula based on SCr of 0.72 mg/dL). Liver Function Tests:  Recent Labs Lab 09/23/15 0858  AST 11*  ALT 8*  ALKPHOS 81  BILITOT 0.6  PROT 7.8  ALBUMIN 3.5    Recent Labs Lab 09/23/15 0858  LIPASE 12   No results for input(s): AMMONIA in the last 168 hours. Coagulation Profile: No  results for input(s): INR, PROTIME in the last 168 hours. Cardiac Enzymes: No results for input(s): CKTOTAL, CKMB, CKMBINDEX, TROPONINI in the last 168 hours. BNP (last 3 results) No results for input(s): PROBNP in the last 8760 hours. HbA1C: No results for input(s): HGBA1C in the last 72 hours. CBG: No results for input(s): GLUCAP in the last 168 hours. Lipid Profile: No results for input(s): CHOL, HDL, LDLCALC, TRIG, CHOLHDL, LDLDIRECT in the last 72 hours. Thyroid Function Tests: No results for input(s): TSH, T4TOTAL, FREET4, T3FREE, THYROIDAB in the last 72 hours. Anemia Panel: No results for input(s): VITAMINB12, FOLATE, FERRITIN, TIBC, IRON, RETICCTPCT in the last 72 hours. Urine analysis:  Component Value Date/Time   COLORURINE AMBER (A) 09/25/2015 0210   APPEARANCEUR CLOUDY (A) 09/25/2015 0210   LABSPEC 1.028 09/25/2015 0210   PHURINE 5.0 09/25/2015 0210   GLUCOSEU NEGATIVE 09/25/2015 0210   HGBUR NEGATIVE 09/25/2015 0210   BILIRUBINUR SMALL (A) 09/25/2015 0210   KETONESUR NEGATIVE 09/25/2015 0210   PROTEINUR NEGATIVE 09/25/2015 0210   UROBILINOGEN 0.2 03/03/2008 1216   NITRITE NEGATIVE 09/25/2015 0210   LEUKOCYTESUR SMALL (A) 09/25/2015 0210   Sepsis Labs: @LABRCNTIP (procalcitonin:4,lacticidven:4) ) Recent Results (from the past 240 hour(s))  Culture, blood (Routine X 2) w Reflex to ID Panel     Status: None (Preliminary result)   Collection Time: 09/24/15  3:20 PM  Result Value Ref Range Status   Specimen Description BLOOD RIGHT ARM  Final   Special Requests BOTTLES DRAWN AEROBIC AND ANAEROBIC 5CC EA  Final   Culture   Final    NO GROWTH 2 DAYS Performed at [facility]    Report Status PENDING  Incomplete  Culture, blood (Routine X 2) w Reflex to ID Panel     Status: None (Preliminary result)   Collection Time: 09/24/15  3:20 PM  Result Value Ref Range Status   Specimen Description BLOOD RIGHT HAND  Final   Special Requests BOTTLES DRAWN AEROBIC  AND ANAEROBIC 5CC EA  Final   Culture   Final    NO GROWTH 2 DAYS Performed at [facility]    Report Status PENDING  Incomplete         Radiology Studies: Ct Head Wo Contrast  Result Date: 09/28/2015 CLINICAL DATA:  60 year old female status post fall in the middle of the night. On Coumadin. Personal history of endometrial carcinoma, and large left lung mass in July. Initial encounter. EXAM: CT HEAD WITHOUT CONTRAST TECHNIQUE: Contiguous axial images were obtained from the base of the skull through the vertex without intravenous contrast. COMPARISON:  PET-CT 08/01/2015. FINDINGS: Brain: Posterior fossa mass effect and abnormal hypodensity in the central and left cerebellar hemisphere encompassing an area of about 3.5 cm (series 2, image 8). Mass effect on the fourth ventricle, but no ventriculomegaly. Crowding of the pre pontine cistern which remains patent. Edema mildly crosses midline to the central right cerebellum. Supratentorial gray-white matter differentiation appears normal. No supratentorial mass effect. Partially empty sella incidentally noted. No acute intracranial hemorrhage identified. No cortically based acute infarct identified. Vascular: Mild Calcified atherosclerosis at the skull base. Skull: left posterior skull exostosis incidentally noted. No acute or suspicious osseous lesion identified. Sinuses/Orbits: Clear. Other: Negative orbit and scalp soft tissues. IMPRESSION: 1. Cerebellar edema centrally on the left with suggestion of an underlying 3.5 cm left cerebellar mass. Mass effect on the fourth ventricle but no ventriculomegaly or loss of basilar cistern patency. 2. Cerebellar metastatic disease favored in this clinical setting. Follow-up Brain MRI without and with contrast would characterize further. 3. No other acute or metastatic intracranial abnormality identified. Electronically Signed   By: Genevie Ann M.D.   On: 09/28/2015 10:35      Scheduled Meds: . cloNIDine   0.1 mg Transdermal Weekly  . dexamethasone  10 mg Intravenous Once  . dexamethasone  4 mg Oral Q6H  . diclofenac sodium  2 g Topical QID  . enoxaparin (LOVENOX) injection  70 mg Subcutaneous Q12H  . feeding supplement (ENSURE ENLIVE)  237 mL Oral BID BM  . hydrALAZINE  50 mg Oral Q6H  . levETIRAcetam  500 mg Oral BID  . metoCLOPramide (REGLAN) injection  5 mg Intravenous Q6H  . metoprolol  5 mg Intravenous Q6H  . ondansetron  4 mg Oral Q6H   Or  . ondansetron (ZOFRAN) IV  4 mg Intravenous Q6H  . pantoprazole  40 mg Oral BID AC  . potassium chloride  10 mEq Intravenous Q1 Hr x 6   Continuous Infusions: . sodium chloride 100 mL/hr at 09/26/15 1311     LOS: 4 days    Time spent in minutes: 35    Tracia Lacomb, MD [facility] [email or web address] Password [email or web address] 09/28/2015, 1:38 PM

## 2015-09-28 NOTE — Addendum Note (Signed)
Encounter addended by: Jacqulyn Liner, RN on: 09/28/2015  3:52 PM<BR>    Actions taken: Charge Capture section accepted

## 2015-09-29 ENCOUNTER — Ambulatory Visit
Admit: 2015-09-29 | Discharge: 2015-09-29 | Disposition: A | Discharge status: Home / Self Care | Payer: Medicaid Other | Attending: Radiation Oncology | Admitting: Radiation Oncology

## 2015-09-29 DIAGNOSIS — C541 Malignant neoplasm of endometrium: Secondary | ICD-10-CM

## 2015-09-29 DIAGNOSIS — G620 Drug-induced polyneuropathy: Secondary | ICD-10-CM

## 2015-09-29 DIAGNOSIS — L299 Pruritus, unspecified: Secondary | ICD-10-CM

## 2015-09-29 DIAGNOSIS — I10 Essential (primary) hypertension: Secondary | ICD-10-CM

## 2015-09-29 DIAGNOSIS — C7931 Secondary malignant neoplasm of brain: Secondary | ICD-10-CM | POA: Insufficient documentation

## 2015-09-29 DIAGNOSIS — I82402 Acute embolism and thrombosis of unspecified deep veins of left lower extremity: Secondary | ICD-10-CM

## 2015-09-29 DIAGNOSIS — R110 Nausea: Secondary | ICD-10-CM

## 2015-09-29 LAB — CBC
HCT: 25.7 % — ABNORMAL LOW (ref 36.0–46.0)
HEMOGLOBIN: 8.5 g/dL — AB (ref 12.0–15.0)
MCH: 23.8 pg — AB (ref 26.0–34.0)
MCHC: 33.1 g/dL (ref 30.0–36.0)
MCV: 72 fL — ABNORMAL LOW (ref 78.0–100.0)
PLATELETS: 404 10*3/uL — AB (ref 150–400)
RBC: 3.57 MIL/uL — ABNORMAL LOW (ref 3.87–5.11)
RDW: 19.7 % — ABNORMAL HIGH (ref 11.5–15.5)
WBC: 7.1 10*3/uL (ref 4.0–10.5)

## 2015-09-29 LAB — BASIC METABOLIC PANEL
Anion gap: 12 (ref 5–15)
BUN: 6 mg/dL (ref 6–20)
CALCIUM: 8.9 mg/dL (ref 8.9–10.3)
CO2: 21 mmol/L — ABNORMAL LOW (ref 22–32)
CREATININE: 0.76 mg/dL (ref 0.44–1.00)
Chloride: 103 mmol/L (ref 101–111)
Glucose, Bld: 121 mg/dL — ABNORMAL HIGH (ref 65–99)
Potassium: 3.7 mmol/L (ref 3.5–5.1)
SODIUM: 136 mmol/L (ref 135–145)

## 2015-09-29 MED ORDER — METOCLOPRAMIDE HCL 5 MG/ML IJ SOLN
5.0000 mg | Freq: Three times a day (TID) | INTRAMUSCULAR | Status: DC
  Administered 2015-09-29 – 2015-10-01 (×9): 5 mg via INTRAVENOUS
  Filled 2015-09-29 (×9): qty 2

## 2015-09-29 MED ORDER — POTASSIUM CHLORIDE CRYS ER 20 MEQ PO TBCR
40.0000 meq | EXTENDED_RELEASE_TABLET | Freq: Once | ORAL | Status: AC
  Administered 2015-09-29: 40 meq via ORAL
  Filled 2015-09-29: qty 2

## 2015-09-29 MED ORDER — HYDRALAZINE HCL 50 MG PO TABS
75.0000 mg | ORAL_TABLET | Freq: Four times a day (QID) | ORAL | Status: DC
  Administered 2015-09-29 – 2015-10-01 (×7): 75 mg via ORAL
  Filled 2015-09-29 (×8): qty 1

## 2015-09-29 NOTE — Progress Notes (Signed)
Medical Oncology September 29, 2015, 8:34 AM  Hospital day 7 Antibiotics: none Chemotherapy: first gemzar 08-11-15, chemo held since then for radiation to chest.   Bisphosphonate: zometa 08-18-15.  Outpatient Physicians:E.Rossi, J.Kinard, C.Windham, P.Lucianne Lei Trigt Verl Bl[facility], [facility]), L.Livesay Patient did not follow up with new patient referral made to [facility] for PCP    Appreciate Dr Alen Blew seeing patient on 09-28-15 and letting me know of admission and unfortunately new finding of large solitary cerebellar metastasis.   EMR reviewed, patient seen and examined, no family here presently. Reviewed with RN at bedside.  Subjective: Less SOB and less cough since recent pallliative radiation to chest. Pain in chest on admission much better with IV fentanyl, also controlled with IV morphine on 9-27 (tho in past has had itching with MS).  She feels some disequilibrium even lying in bed, denies HA, no improvement in dizziness that she can tell with IV steroids past 24 hrs. She has been able to tolerate liquids and some soft foods.  Bowels are moving regularly. No problems with PAC. No mucositis. No LE swelling. No overt bleeding.    PAC placed by IR on 08-10-15 OnEliquis for LLE DVT  Genetics testing negative Breast Ovarian and gyn panel by [facility] 06-14-15.  CA 125 on 02-14-15 642.9 ER + on cytology 02-2015 Feraheme 02-16-15 and [id number].   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 She was lost to follow up for the gyn cancer after 11-2010, until presented with recurrent disease. Patient reported right inguinal mass for ~ 2 months, then  LLE pain for ~ 3 weeks when she presented to ED on 02-12-15. CT AP showed nodules in lung bases suspicious for metastatic disease, liver not remarkable, no hydronephrosis, 6 mm right renal calculus, thrombus left external iliac vein into left common femoral vein, retroperitoneal and pelvic adenopathy, an 8.5 x 7.1 cm necrotic right inguinal node, post hyst ooph, no ascites, likely tumor involvement in left psoas with adjacent involvement of L4 and L5 vertebral bodies; CT chest subsequently had multiple bilateral pulmonary nodules and central adenopathy. Patient was begun on heparin qtt which was transitioned to Eliquis (on pharmaceutical assistance program). CA 125 from 02-14-15 was 642.9, this having been 3.6 in 08-2010. Pathology from right inguinal nodal mass aspiration and core needle biopsy 02-15-15 necrotic material with metastatic adenocarcinoma with immunostains consistent with gyn primary ([id number], ER+). She was transfused 2 units PRBCs for hgb 6.7. Iron studies 2-12 with serum iron 14 and %sat 7, given feraheme x2. Dr Denman George saw in consultation prior to starting carboplatin taxotere on 02-24-15. She reacted to [place] skin test prior to cycle 3, treatment continued with taxotere only thru cycle 5 on 05-26-15, tolerated neulasta poorly. She had I&D and capsulectomy of seroma cavity right inguinal region on 05-12-15. CT CAP 06-14-15 significant improvement in all areas of metastatic disease with exception of enlarging cystic mass in left hilum. Echocardiogram 07-19-15 done to evaluate central chest mass: EF 60-65% and minimal pericardial effusion. PET 08-01-15 showedsignificant progression of multiple areas of disease in addition to the chest involvement. She had first gemzar 08-11-15, then began letrozole on 08-18-15 in attempt to get some systemic coverage as she was receiving chest radiation. Radiation to left chest completed  09-15-15, with improvement in cough and in SOB. She was admitted 09-23-15 with chest pain,  N/V and dizziness, found to have solitary cerebellar metastasis.   Objective: Vital signs in last 24 hours: Blood pressure (!) 176/90, pulse 92, temperature 97.8 F (36.6 C), temperature source Oral, resp. rate 18, height _0  (1.6 m), weight 154 lb 8.7 oz (70.1 kg), SpO2 100 %. Awake, alert, looks comfortable lying supine on right side, respirations not labored RA. PERRL, not icteric. Oral mucosa moist without lesions. Pale mucous membranes and nail beds. No JVD. Lungs with breath sounds clear on right and heard left upper and lower fields posteriorly, no wheezes or rales. Heart RRR no gallop. PAC site unremarkable, infusing without difficulty. Abdomen soft, a few BS, not tender, not distended. LE no edema, cords, tenderness. Moves all extremities in bed. Speech fluent and appropriate.   Intake/Output from previous day: 09/27 0701 - 09/28 0700 In: 475 [P.O.:475] Out: 900 [Urine:900] Intake/Output this shift: No intake/output data recorded.    Lab Results:  Recent Labs  09/28/15 0550 09/29/15 0515  WBC 4.5 7.1  HGB 7.5* 8.5*  HCT 23.2* 25.7*  PLT 277 404*   BMET  Recent Labs  09/28/15 0550 09/29/15 0515  NA 138 136  K 2.6* 3.7  CL 105 103  CO2 24 21*  GLUCOSE 99 121*  BUN <5* 6  CREATININE 0.72 0.76  CALCIUM 8.6* 8.9    Studies/Results: Ct Head Wo Contrast  Result Date: 09/28/2015 CLINICAL DATA:  60 year old female status post fall in the middle of the night. On Coumadin. Personal history of endometrial carcinoma, and large left lung mass in July. Initial encounter. EXAM: CT HEAD WITHOUT CONTRAST TECHNIQUE: Contiguous axial images were obtained from the base of the skull through the vertex without intravenous contrast. COMPARISON:  PET-CT 08/01/2015. FINDINGS: Brain: Posterior fossa mass effect and abnormal hypodensity in the central and left cerebellar hemisphere encompassing an area of about 3.5 cm (series 2, image 8). Mass effect on the fourth ventricle, but no  ventriculomegaly. Crowding of the pre pontine cistern which remains patent. Edema mildly crosses midline to the central right cerebellum. Supratentorial gray-white matter differentiation appears normal. No supratentorial mass effect. Partially empty sella incidentally noted. No acute intracranial hemorrhage identified. No cortically based acute infarct identified. Vascular: Mild Calcified atherosclerosis at the skull base. Skull: left posterior skull exostosis incidentally noted. No acute or suspicious osseous lesion identified. Sinuses/Orbits: Clear. Other: Negative orbit and scalp soft tissues. IMPRESSION: 1. Cerebellar edema centrally on the left with suggestion of an underlying 3.5 cm left cerebellar mass. Mass effect on the fourth ventricle but no ventriculomegaly or loss of basilar cistern patency. 2. Cerebellar metastatic disease favored in this clinical setting. Follow-up Brain MRI without and with contrast would characterize further. 3. No other acute or metastatic intracranial abnormality identified. Electronically Signed   By: Genevie Ann M.D.   On: 09/28/2015 10:35   Mr Jeri Cos JQ Contrast  Result Date: 09/29/2015 CLINICAL DATA:  Nausea and vomiting, chest pain for 1 day. History of metastatic endometrial and ovarian cancer, on radiation therapy. Followup probable LEFT cerebellar mass. EXAM: MRI HEAD WITHOUT AND WITH CONTRAST TECHNIQUE: Multiplanar, multiecho pulse sequences of the brain and surrounding structures were obtained without and with intravenous contrast. CONTRAST:  80m MULTIHANCE GADOBENATE DIMEGLUMINE 529 MG/ML IV SOLN COMPARISON:  CT HEAD September 28, 2015 at 1010 hours. FINDINGS: BRAIN: T2 bright heterogeneously enhancing 2 x 2.3 x 2 cm (transverse by AP by CC) LEFT cerebellar  mass corresponding to C2 abnormality. Faint susceptibility artifact along the margins. Surrounding T2 bright vasogenic edema with mild mass effect on the fourth ventricle which remains patent. No additional  intraparenchymal mass. Supratentorial ventricles are normal. A few scattered sub cm supratentorial white matter FLAIR T2 hyperintensities compatible with mild chronic small vessel ischemic disease, within normal range for patient's age. No abnormal extra-axial fluid collections, extra-axial enhancement or extra-axial masses. VASCULAR: Normal major intracranial vascular flow voids present at skull base. SKULL AND UPPER CERVICAL SPINE: No abnormal sellar expansion. No suspicious calvarial bone marrow signal. Craniocervical junction maintained. Torus palatini. LEFT parietal exostosis without enhancement. SINUSES/ORBITS: The mastoid air-cells and included paranasal sinuses are well-aerated. The included ocular globes and orbital contents are non-suspicious. OTHER: None. 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 Vasogenic edema with mild mass effect on the fourth ventricle, no obstructive hydrocephalus. Otherwise negative MRI of the head for age. Electronically Signed   By: Elon Alas M.D.   On: 09/29/2015 03:52   PACs images reviewed   MEDICATIONS reviewed in EMR and discussed with RN at bedside. IV fentanyl helpful after admission, changed to morphine as of 09-28-15 (note morphine previously has caused itching, and some intolerance thought also to dilaudid); she has not tried fentanyl patch.  DISCUSSION: Interval history reviewed. Patient understands finding of metastatic disease to cerebellum and plan for radiation, likely SRS. Discussed use of steroids until radiation has improved this.  Again discussed her wishes as to resuscitation and life support, as also done at my office visit 09-06-15 (see note).  As previously discussed with patient, daughter, aunt and cousin at that visit, resuscitation and life support will not change outcome of incurable metastatic cancer and time on life support is not good quality time with family. Even if decision is not to do resuscitation of life  support, all interventions short of that which may benefit can still be used- including SRS to cerebellar met, medications and other treatments. At time of office discussion, patient was clear that she did not want resuscitation or life support; she tells me now that she changed code status on admission because she wanted all family to be in agreement, including husband and other 2 daughters. As those family members have not been involved with MD discussions, I have suggested that palliative care consult including family meeting may be helpful and patient is glad for this to be requested. Patient tells me again that she does not want resuscitation or life support, but wants to discuss with family.     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 Lost to follow up after 11-2010 until presented with metastatic gyn cancer 02-2015. Treated with carbo taxane in palliative attempt with initial response,; I&D with capsulectomy of seroma cavity right groin  05-12-15, adenocarcinoma in that path. Extensive progression in chest with radiation thru 09-15-15. New cerebellar met, steroids begun and radiation anticipated.   2.Code status: she has advanced and incurable metastatic cancer. Patient would like family discussion with palliative care team, but has told me again today that she does not want to be on life support.  3.LLE DVT documented 02-12-15: on Eliquis PTA, now lovenox. No IVC filter.No apparent bleeding with the cerebellar met and no other overt bleeding.  Eliquis approved thru [employer] assistance x 1 year.  4. PRBCs  given 08-08-15 for symptomatic anemia. She has had feraheme x 2 in [facility] 2017. She does not seem symptomatic from this anemia with present minimal activity, follow.  5..uncontrolled HTN on  presentation with metastatic disease. She has basically refused to establish with a PCP. 6..Itching with morphine, nausea with dilaudid. May need to try duragesic. 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  11. Needs flu vaccine this fall  Please page me if I can help between my rounds [place]  Evlyn Clines MD

## 2015-09-29 NOTE — Progress Notes (Signed)
  Radiation Oncology         [phone] ________________________________  Name: NIYANNA ASCH MRN: [id number]  Date: 09/23/2015  DOB: 1955-12-23  Chart Note:  MRI confirms solitary brain met amenable to [facility] and resection.  Will discuss options with neurosurgery and patient.    ________________________________  Sheral Apley. Tammi Klippel, M.D.

## 2015-09-29 NOTE — Progress Notes (Signed)
Nutrition Follow-up  DOCUMENTATION CODES:   Severe malnutrition in context of chronic illness  INTERVENTION:   Continue Ensure Enlive po BID, each supplement provides 350 kcal and 20 grams of protein Encourage PO intake RD to continue to monitor  NUTRITION DIAGNOSIS:   Malnutrition related to chronic illness as evidenced by percent weight loss, energy intake < or equal to 75% for > or equal to 1 month.  Ongoing.  GOAL:   Patient will meet greater than or equal to 90% of their needs  Progressing.  MONITOR:   PO intake, Supplement acceptance, Labs, Weight trends, I & O's, GOC  ASSESSMENT:   60 y.o. 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 She presented to the emergency department after suffering 1 day of significant worsening nausea and vomiting with associated chest pain that started this morning. Chest pain made worse with vomiting. No hematemesis. Pain does not radiate. She took an aspirin which did not help with her pain. She has been receiving radiation therapy for her metastatic disease and was told that the radiation could be causing the nausea and vomiting  Patient in room with family members at bedside. Pt attempting to eat some breakfast of pancakes and whole milk. She states she is not nauseous at this time. She feels full quickly. Pt is excited about being on a solid diet. Pt has not consumed Ensure supplements for the past 48 hours. Pt much more alert today. She is willing to try a ensure supplements later today. Will continue order.  Medications: Decadron tablet every 6 hours, Reglan IV TID, IV Zofran every 6 hours, IV Phenergan PRN  Labs reviewed: Low Mg  Diet Order:  DIET SOFT Room service appropriate? Yes; Fluid consistency: Thin  Skin:  Reviewed, no issues  Last BM:  9/26  Height:   Ht Readings from Last 1 Encounters:  09/23/15 5'  3" (1.6 m)    Weight:   Wt Readings from Last 1 Encounters:  09/29/15 154 lb 8.7 oz (70.1 kg)    Ideal Body Weight:  52.3 kg  BMI:  Body mass index is 27.38 kg/m.  Estimated Nutritional Needs:   Kcal:  1900-2100  Protein:  95-105g  Fluid:  2.1L/day  EDUCATION NEEDS:   No education needs identified at this time  Clayton Bibles, MS, RD, LDN Pager: [phone] After Hours Pager: [phone]

## 2015-09-29 NOTE — Progress Notes (Signed)
Palliative Medicine Team consult was received.   I met briefly with [name] today. She reports that her goal is to live as well as possible long as possible. She states that she is being evaluated for potential intervention for brain lesion by rad onc/neurosurgery.  Her nausea is improved today but she reports getting full and sick with meals.  She is ordered reglan already.  I did adjust timing of reglan to [facility] and HS rather than Q6H to see if this helps with mealtime nausea.  She has been discussing advance care planning with Dr. Marko Plume.  She had expressed a desire to avoid heroic measures at the end of life, but states that her family has told her to "keep fighting."  She reports that it would be helpful to set up a meeting with her family and our team to discuss goals moving forward, but she wants to speak with her family as well as rad onc prior to scheduling meeting.  She thinks that family will be able to meet tomorrow.    We will schedule a meeting with patient and her family at the earliest possible time family can meet and we have a provider available. If there are urgent needs or questions please call [phone]. Thank you for consulting out team to assist with this patients care.  NO CHARGE VISIT  Lisa Rough, MD [place] Team [phone]

## 2015-09-29 NOTE — Progress Notes (Signed)
I spoke with the patient and her daughters and we discussed the role of SRS. Dr. Vertell Limber is reviewing her case as well and she may be an operative candidate. If this is the case, we will proceed with SRS prior to surgical resection. She is on the schedule for simulation tomorrow at 10 am. I again reviewed risks, benefits, short, and long term effects of radiotherapy with the patient and her daughters. She will see Dr. Vertell Limber also prior to treatment, and we anticipate radiation to occur mid week next week.      Carola Rhine, PAC

## 2015-09-29 NOTE — Evaluation (Signed)
Physical Therapy Evaluation Patient Details Name: [name] MRN: [id number] DOB: February 28, 1955 Today's Date: 09/29/2015   History of Present Illness  Pt admitted through ED with c/o N/V and chest pain.  Pt with hx of Met CA, chronic anemia, and Chemo-induced neuropathy  Clinical Impression  Pt admitted as above and presenting with functional mobility limitations 2* generalized weakness, dizziness with OOB activity (BP 173/92) and ambulatory balance deficits.  Pt plans dc home with family assist and would benefit from follow up HHPT to further address deficits.    Follow Up Recommendations Home health PT    Equipment Recommendations  None recommended by PT    Recommendations for Other Services OT consult     Precautions / Restrictions Precautions Precautions: Fall Restrictions Weight Bearing Restrictions: No      Mobility  Bed Mobility Overal bed mobility: Needs Assistance Bed Mobility: Supine to Sit;Sit to Supine     Supine to sit: Modified independent (Device/Increase time) Sit to supine: Supervision      Transfers Overall transfer level: Needs assistance Equipment used: Rolling walker (2 wheeled) Transfers: Sit to/from Stand Sit to Stand: Min assist         General transfer comment: cues for transition position and use of UEs to self assist.  Assist to bring wt up and fwd and to steady in initial standing  Ambulation/Gait Ambulation/Gait assistance: Min assist Ambulation Distance (Feet): 90 Feet Assistive device: Rolling walker (2 wheeled) Gait Pattern/deviations: Step-through pattern;Decreased step length - right;Decreased step length - left;Shuffle;Trunk flexed Gait velocity: decr Gait velocity interpretation: Below normal speed for age/gender General Gait Details: Mod general instability largely corrected with use of RW and cues for posture and position from [employer]            Wheelchair Mobility    Modified Rankin (Stroke Patients  Only)       Balance Overall balance assessment: Needs assistance Sitting-balance support: Feet supported;No upper extremity supported Sitting balance-Leahy Scale: Good     Standing balance support: Bilateral upper extremity supported Standing balance-Leahy Scale: Fair                               Pertinent Vitals/Pain Pain Assessment: No/denies pain    Home Living Family/patient expects to be discharged to:: Private residence Living Arrangements: Spouse/significant other Available Help at Discharge: Family Type of Home: House Home Access: Stairs to enter Entrance Stairs-Rails: Right Entrance Stairs-Number of Steps: 4 Home Layout: Two level Home Equipment: Environmental consultant - 2 wheels      Prior Function Level of Independence: Needs assistance   Gait / Transfers Assistance Needed: Very ltd just prior to admit 2* weakness  ADL's / Homemaking Assistance Needed: assist of family        Hand Dominance        Extremity/Trunk Assessment   Upper Extremity Assessment: Generalized weakness           Lower Extremity Assessment: Generalized weakness (Pt denies numbness 2* neuropathy)         Communication   Communication: No difficulties  Cognition Arousal/Alertness: Awake/alert Behavior During Therapy: WFL for tasks assessed/performed;Flat affect Overall Cognitive Status: Within Functional Limits for tasks assessed                      General Comments      Exercises     Assessment/Plan    PT Assessment Patient needs continued PT services  PT Problem List Decreased strength;Decreased activity tolerance;Decreased balance;Decreased mobility;Decreased knowledge of use of DME          PT Treatment Interventions DME instruction;Gait training;Stair training;Functional mobility training;Therapeutic activities;Therapeutic exercise;Balance training;Patient/family education    PT Goals (Current goals can be found in the Care Plan section)  Acute  Rehab PT Goals Patient Stated Goal: Regain IND PT Goal Formulation: With patient Time For Goal Achievement: 10/08/15 Potential to Achieve Goals: Good    Frequency Min 3X/week   Barriers to discharge        Co-evaluation               End of Session Equipment Utilized During Treatment: Gait belt Activity Tolerance: Patient limited by fatigue;Patient tolerated treatment well Patient left: in bed;with call bell/phone within reach;with family/visitor present Nurse Communication: Mobility status         Time: 1515-1540 PT Time Calculation (min) (ACUTE ONLY): 25 min   Charges:   PT Evaluation $PT Eval Low Complexity: 1 Procedure PT Treatments $Gait Training: 8-22 mins   PT G Codes:        [name] 10-15-15, 5:23 PM

## 2015-09-29 NOTE — Progress Notes (Signed)
PROGRESS NOTE    [name]  [id number] DOB: 01-27-1955 DOA: 09/23/2015  PCP: [facility]   Brief Narrative:  [name] is a 60 y.o. 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 She presented to the emergency department after suffering 1 day of significant worsening nausea and vomiting with associated chest pain that started this morning. Chest pain made worse with vomiting. No hematemesis. Pain does not radiate. She took an aspirin which did not help with her pain. She has been receiving radiation therapy for her metastatic disease and was told that the radiation could be causing the nausea and vomiting, CT head obtained secondary to fall during hospital stay significant for metastasis. Significant improvement of nausea and vomiting after starting Decadron for brain metastasis,   Subjective: Denies any further vomiting, but still complains of nausea and vertigo , at full overnight .   Assessment & Plan:      Nausea & vomiting with fever - CT abd/ pelvis unrevealing, LFTs not suggestive of liver/ gallbladder issues, lipase normal - nausea and vomiting began when she began radiation to the chest , possibly related to radiation, as well patient with a new diagnosis of brain metastasis, she reports significant improvement of her symptoms after she was started on Decadron, so nausea and vomiting more likely related to new brain metastases. 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 Vertell Limber from neurosurgery prior to her treatment for radiation oncology. - Continue with Decadron for  edema and Keppra for seizure prophylaxis - Discussed with oncology Dr. Osker Mason, Dr Gwyneth Revels to see tomorrow.     Fever - initially suspected that vomiting and fever was possible related to a gastroenteritis   - temp 102 on 9/22 starting after being admitted to the hospital- it has not recurred - UA negative, blood cultures NGTD - no respiratory symptoms-CT chest, abd, pelvis unrevealing- no diarrhea  Chest pain  - likely due to vomiting and musculoskeletal- started Voltaren gel- weaned off Fentanyl - troponin negative- EKG unchanged from prior    H/o LLE DVT in 2/17 -unable to tolerate oral mediations today- start Lovenox , can be resumed oral anticoagulation prior to discharge    Essential hypertension - on Clonidine, labetalol - hold HCTZ while she is dehydrated, currently on clonidine patch, started on hydralazine due to poorly controlled blood pressure.   Cancer related pain - Palliative medicine consult appreciated    Anemia of chronic disease - baseline is between 8-10- last anemia panel in 2/17 consistent with AOCD - hold transfusion for now    Pericardial effusion - noted on CT chest in ER- this is mild and does not need further work up at this point   DVT prophylaxis: apixaban>> Lovenox Code Status: Full code (previously made DNR by Dr Marko Plume but now wants Full code) Family Communication: None at bedside Disposition Plan: Pending Pt evaluation Consultants:   Radiation oncology  oncology Procedures:    Antimicrobials:  Anti-infectives    None  Objective: Vitals:   09/28/15 1500 09/28/15 2233 09/29/15 0516 09/29/15 0622  BP: (!) 160/80 (!) 170/93 (!) 158/69 (!) 176/90  Pulse:  95 73 92  Resp:    18  Temp:  98.2 F (36.8 C)  97.8 F (36.6 C)  TempSrc:  Oral  Oral  SpO2:    100%  Weight:    70.1 kg (154  lb 8.7 oz)  Height:        Intake/Output Summary (Last 24 hours) at 09/29/15 1331 Last data filed at 09/29/15 [id number]  Gross per 24 hour  Intake              355 ml  Output                0 ml  Net              355 ml   Filed Weights   09/27/15 0500 09/28/15 0430 09/29/15 0622  Weight: 73.9 kg (162 lb 14.7 oz) 70.3 kg (154 lb 15.7 oz) 70.1 kg (154 lb 8.7 oz)    Examination: General exam: Appears comfortable  HEENT: PERRLA, oral mucosa moist, no sclera icterus or thrush Respiratory system: Clear to auscultation. Respiratory effort normal. Cardiovascular system: S1 & S2 heard, RRR.  No murmurs , left side chest tenderness to palpation Chest: tender in right chest wall in area of 3rd & 4th rib Gastrointestinal system: Abdomen soft, non-tender , nondistended. Normal bowel sound. No organomegaly Central nervous system: Alert and oriented. No focal neurological deficits. Extremities: No cyanosis, clubbing or edema Skin: No rashes or ulcers Psychiatry:  Mood & affect appropriate.     Data Reviewed: I have personally reviewed following labs and imaging studies  CBC:  Recent Labs Lab 09/25/15 0357 09/26/15 1240 09/27/15 0938 09/28/15 0550 09/29/15 0515  WBC 9.6 6.5 5.4 4.5 7.1  HGB 7.6* 7.3* 7.5* 7.5* 8.5*  HCT 23.8* 23.1* 23.4* 23.2* 25.7*  MCV 76.0* 77.3* 76.2* 72.7* 72.0*  PLT 252 283 322 277 [id number]*   Basic Metabolic Panel:  Recent Labs Lab 09/23/15 0858 09/24/15 0417 09/25/15 0357 09/28/15 0550 09/29/15 0515  NA 139 137 138 138 136  K 3.0* 4.2 4.0 2.6* 3.7  CL 102 107 110 105 103  CO2 25 23 21* 24 21*  GLUCOSE 170* 119* 96 99 121*  BUN 10 13 12  <5* 6  CREATININE 0.93 0.98 0.84 0.72 0.76  CALCIUM 9.2 7.9* 7.6* 8.6* 8.9  MG  --   --   --  1.4*  --    GFR: Estimated Creatinine Clearance: 68.5 mL/min (by C-G formula based on SCr of 0.76 mg/dL). Liver Function Tests:  Recent Labs Lab 09/23/15 0858  AST 11*  ALT 8*  ALKPHOS 81  BILITOT 0.6  PROT 7.8    ALBUMIN 3.5    Recent Labs Lab 09/23/15 0858  LIPASE 12   No results for input(s): AMMONIA in the last 168 hours. Coagulation Profile: No results for input(s): INR, PROTIME in the last 168 hours. Cardiac Enzymes: No results for input(s): CKTOTAL, CKMB, CKMBINDEX, TROPONINI in the last 168 hours. BNP (last 3 results) No results for input(s): PROBNP in the last 8760 hours. HbA1C: No results for input(s): HGBA1C in the last 72 hours. CBG: No results for input(s): GLUCAP in the last 168 hours. Lipid Profile: No results for input(s): CHOL, HDL, LDLCALC, TRIG, CHOLHDL, LDLDIRECT in the last 72 hours. Thyroid Function Tests: No results for input(s): TSH, T4TOTAL, FREET4, T3FREE, THYROIDAB in the last  72 hours. Anemia Panel: No results for input(s): VITAMINB12, FOLATE, FERRITIN, TIBC, IRON, RETICCTPCT in the last 72 hours. Urine analysis:    Component Value Date/Time   COLORURINE AMBER (A) 09/25/2015 0210   APPEARANCEUR CLOUDY (A) 09/25/2015 0210   LABSPEC 1.028 09/25/2015 0210   PHURINE 5.0 09/25/2015 0210   GLUCOSEU NEGATIVE 09/25/2015 0210   HGBUR NEGATIVE 09/25/2015 0210   BILIRUBINUR SMALL (A) 09/25/2015 0210   KETONESUR NEGATIVE 09/25/2015 0210   PROTEINUR NEGATIVE 09/25/2015 0210   UROBILINOGEN 0.2 03/03/2008 1216   NITRITE NEGATIVE 09/25/2015 0210   LEUKOCYTESUR SMALL (A) 09/25/2015 0210   Sepsis Labs: @LABRCNTIP (procalcitonin:4,lacticidven:4) ) Recent Results (from the past 240 hour(s))  Culture, blood (Routine X 2) w Reflex to ID Panel     Status: None (Preliminary result)   Collection Time: 09/24/15  3:20 PM  Result Value Ref Range Status   Specimen Description BLOOD RIGHT ARM  Final   Special Requests BOTTLES DRAWN AEROBIC AND ANAEROBIC 5CC EA  Final   Culture   Final    NO GROWTH 3 DAYS Performed at [facility]    Report Status PENDING  Incomplete  Culture, blood (Routine X 2) w Reflex to ID Panel     Status: None (Preliminary result)    Collection Time: 09/24/15  3:20 PM  Result Value Ref Range Status   Specimen Description BLOOD RIGHT HAND  Final   Special Requests BOTTLES DRAWN AEROBIC AND ANAEROBIC 5CC EA  Final   Culture   Final    NO GROWTH 3 DAYS Performed at [facility]    Report Status PENDING  Incomplete         Radiology Studies: Ct Head Wo Contrast  Result Date: 09/28/2015 CLINICAL DATA:  60 year old female status post fall in the middle of the night. On Coumadin. Personal history of endometrial carcinoma, and large left lung mass in July. Initial encounter. EXAM: CT HEAD WITHOUT CONTRAST TECHNIQUE: Contiguous axial images were obtained from the base of the skull through the vertex without intravenous contrast. COMPARISON:  PET-CT 08/01/2015. FINDINGS: Brain: Posterior fossa mass effect and abnormal hypodensity in the central and left cerebellar hemisphere encompassing an area of about 3.5 cm (series 2, image 8). Mass effect on the fourth ventricle, but no ventriculomegaly. Crowding of the pre pontine cistern which remains patent. Edema mildly crosses midline to the central right cerebellum. Supratentorial gray-white matter differentiation appears normal. No supratentorial mass effect. Partially empty sella incidentally noted. No acute intracranial hemorrhage identified. No cortically based acute infarct identified. Vascular: Mild Calcified atherosclerosis at the skull base. Skull: left posterior skull exostosis incidentally noted. No acute or suspicious osseous lesion identified. Sinuses/Orbits: Clear. Other: Negative orbit and scalp soft tissues. IMPRESSION: 1. Cerebellar edema centrally on the left with suggestion of an underlying 3.5 cm left cerebellar mass. Mass effect on the fourth ventricle but no ventriculomegaly or loss of basilar cistern patency. 2. Cerebellar metastatic disease favored in this clinical setting. Follow-up Brain MRI without and with contrast would characterize further. 3. No other acute  or metastatic intracranial abnormality identified. Electronically Signed   By: Genevie Ann M.D.   On: 09/28/2015 10:35   Mr Jeri Cos [id number] Contrast  Result Date: 09/29/2015 CLINICAL DATA:  Nausea and vomiting, chest pain for 1 day. History of metastatic endometrial and ovarian cancer, on radiation therapy. Followup probable LEFT cerebellar mass. EXAM: MRI HEAD WITHOUT AND WITH CONTRAST TECHNIQUE: Multiplanar, multiecho pulse sequences of the brain and surrounding structures were obtained without and  with intravenous contrast. CONTRAST:  47mL MULTIHANCE GADOBENATE DIMEGLUMINE 529 MG/ML IV SOLN COMPARISON:  CT HEAD September 28, 2015 at 1010 hours. FINDINGS: BRAIN: T2 bright heterogeneously enhancing 2 x 2.3 x 2 cm (transverse by AP by CC) LEFT cerebellar mass corresponding to C2 abnormality. Faint susceptibility artifact along the margins. Surrounding T2 bright vasogenic edema with mild mass effect on the fourth ventricle which remains patent. No additional intraparenchymal mass. Supratentorial ventricles are normal. A few scattered sub cm supratentorial white matter FLAIR T2 hyperintensities compatible with mild chronic small vessel ischemic disease, within normal range for patient's age. No abnormal extra-axial fluid collections, extra-axial enhancement or extra-axial masses. VASCULAR: Normal major intracranial vascular flow voids present at skull base. SKULL AND UPPER CERVICAL SPINE: No abnormal sellar expansion. No suspicious calvarial bone marrow signal. Craniocervical junction maintained. Torus palatini. LEFT parietal exostosis without enhancement. SINUSES/ORBITS: The mastoid air-cells and included paranasal sinuses are well-aerated. The included ocular globes and orbital contents are non-suspicious. OTHER: None. 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 Vasogenic edema with mild mass effect on the fourth ventricle, no obstructive hydrocephalus. Otherwise negative MRI of the head for age.  Electronically Signed   By: Elon Alas M.D.   On: 09/29/2015 03:52      Scheduled Meds: . cloNIDine  0.1 mg Transdermal Weekly  . dexamethasone  4 mg Oral Q6H  . diclofenac sodium  2 g Topical QID  . enoxaparin (LOVENOX) injection  70 mg Subcutaneous Q12H  . feeding supplement (ENSURE ENLIVE)  237 mL Oral BID BM  . hydrALAZINE  75 mg Oral Q6H  . levETIRAcetam  500 mg Oral BID  . metoCLOPramide (REGLAN) injection  5 mg Intravenous TID AC & HS  . metoprolol  5 mg Intravenous Q6H  . ondansetron  4 mg Oral Q6H   Or  . ondansetron (ZOFRAN) IV  4 mg Intravenous Q6H  . pantoprazole  40 mg Oral BID AC  . potassium chloride  40 mEq Oral Once  . potassium chloride  40 mEq Oral Once   Continuous Infusions: . sodium chloride 100 mL/hr at 09/26/15 1311     LOS: 5 days     Kelleigh Skerritt, MD [facility] [email or web address] Password [email or web address] 09/29/2015, 1:31 PM

## 2015-09-30 ENCOUNTER — Ambulatory Visit
Admit: 2015-09-30 | Discharge: 2015-09-30 | Disposition: A | Discharge status: Home / Self Care | Payer: Medicaid Other | Attending: Radiation Oncology | Admitting: Radiation Oncology

## 2015-09-30 DIAGNOSIS — C801 Malignant (primary) neoplasm, unspecified: Secondary | ICD-10-CM | POA: Insufficient documentation

## 2015-09-30 DIAGNOSIS — Z510 Encounter for antineoplastic radiation therapy: Secondary | ICD-10-CM | POA: Insufficient documentation

## 2015-09-30 DIAGNOSIS — C541 Malignant neoplasm of endometrium: Secondary | ICD-10-CM

## 2015-09-30 DIAGNOSIS — C7931 Secondary malignant neoplasm of brain: Secondary | ICD-10-CM

## 2015-09-30 DIAGNOSIS — Z515 Encounter for palliative care: Secondary | ICD-10-CM

## 2015-09-30 DIAGNOSIS — Z7189 Other specified counseling: Secondary | ICD-10-CM

## 2015-09-30 LAB — CULTURE, BLOOD (ROUTINE X 2)
CULTURE: NO GROWTH
CULTURE: NO GROWTH

## 2015-09-30 LAB — GLUCOSE, CAPILLARY: GLUCOSE-CAPILLARY: 133 mg/dL — AB (ref 65–99)

## 2015-09-30 LAB — CBC
HCT: 24.1 % — ABNORMAL LOW (ref 36.0–46.0)
Hemoglobin: 7.8 g/dL — ABNORMAL LOW (ref 12.0–15.0)
MCH: 24.2 pg — AB (ref 26.0–34.0)
MCHC: 32.4 g/dL (ref 30.0–36.0)
MCV: 74.8 fL — AB (ref 78.0–100.0)
PLATELETS: 409 10*3/uL — AB (ref 150–400)
RBC: 3.22 MIL/uL — ABNORMAL LOW (ref 3.87–5.11)
RDW: 20.2 % — AB (ref 11.5–15.5)
WBC: 10.3 10*3/uL (ref 4.0–10.5)

## 2015-09-30 LAB — BASIC METABOLIC PANEL
Anion gap: 6 (ref 5–15)
BUN: 12 mg/dL (ref 6–20)
CO2: 22 mmol/L (ref 22–32)
CREATININE: 0.95 mg/dL (ref 0.44–1.00)
Calcium: 8.2 mg/dL — ABNORMAL LOW (ref 8.9–10.3)
Chloride: 109 mmol/L (ref 101–111)
GFR calc Af Amer: >60 mL/min (ref >60)
GLUCOSE: 130 mg/dL — AB (ref 65–99)
POTASSIUM: 3.4 mmol/L — AB (ref 3.5–5.1)
Sodium: 137 mmol/L (ref 135–145)

## 2015-09-30 LAB — HEMOGLOBIN AND HEMATOCRIT, BLOOD
HEMATOCRIT: 27.3 % — AB (ref 36.0–46.0)
Hemoglobin: 8.9 g/dL — ABNORMAL LOW (ref 12.0–15.0)

## 2015-09-30 LAB — PREPARE RBC (CROSSMATCH)

## 2015-09-30 MED ORDER — POTASSIUM CHLORIDE CRYS ER 20 MEQ PO TBCR
40.0000 meq | EXTENDED_RELEASE_TABLET | Freq: Once | ORAL | Status: AC
  Filled 2015-09-30: qty 2

## 2015-09-30 MED ORDER — SENNOSIDES-DOCUSATE SODIUM 8.6-50 MG PO TABS
1.0000 | ORAL_TABLET | Freq: Two times a day (BID) | ORAL | Status: DC
  Administered 2015-09-30 – 2015-10-01 (×2): 1 via ORAL
  Filled 2015-09-30 (×3): qty 1

## 2015-09-30 MED ORDER — CLONIDINE HCL 0.1 MG PO TABS
0.1000 mg | ORAL_TABLET | Freq: Every day | ORAL | Status: DC
  Administered 2015-09-30: 0.1 mg via ORAL
  Filled 2015-09-30: qty 1

## 2015-09-30 MED ORDER — APIXABAN 5 MG PO TABS
5.0000 mg | ORAL_TABLET | Freq: Two times a day (BID) | ORAL | Status: DC
  Administered 2015-09-30 – 2015-10-01 (×2): 5 mg via ORAL
  Filled 2015-09-30 (×2): qty 1

## 2015-09-30 MED ORDER — SODIUM CHLORIDE 0.9 % IV SOLN: Freq: Once | INTRAVENOUS | Status: AC

## 2015-09-30 MED ORDER — LABETALOL HCL 100 MG PO TABS
200.0000 mg | ORAL_TABLET | Freq: Three times a day (TID) | ORAL | Status: DC
  Administered 2015-09-30 – 2015-10-01 (×3): 200 mg via ORAL
  Filled 2015-09-30 (×3): qty 2

## 2015-09-30 NOTE — Progress Notes (Addendum)
[place] for enoxaparin back to apixaban Indication: VTE treatment  Allergies  Allergen Reactions  . Morphine And Related Itching and Other (See Comments)    Pruritis with morphine - depends on strength of medication  . Dilaudid [Hydromorphone Hcl] Itching and Nausea And Vomiting    Patient Measurements: Height: 5\' 3"  (160 cm) Weight: 154 lb 8.7 oz (70.1 kg) IBW/kg (Calculated) : 52.4 Heparin Dosing Weight:   Vital Signs: Temp: 98.4 F (36.9 C) (09/29 0539) Temp Source: Oral (09/29 0539) BP: 172/80 (09/29 0539) Pulse Rate: 97 (09/29 0539)  Labs:  Recent Labs  09/28/15 0550 09/29/15 0515 09/30/15 0530  HGB 7.5* 8.5* 7.8*  HCT 23.2* 25.7* 24.1*  PLT 277 404* 409*  CREATININE 0.72 0.76 0.95    Estimated Creatinine Clearance: 57.7 mL/min (by C-G formula based on SCr of 0.95 mg/dL).   Medical HisAssessment: Pharmacy is consulted to dose enoxaparin in 60 yo female with hx of DVT on apixaban 5 mg PO BID. However, due to vomiting and poor oral intake, anticoagulation is being transitioned to enoxaparin. Nausea/vomiting expected to be due to brain mets.    Today, 09/30/2015:  CBC: Hgb low/stable, pltc elevated  Renal: SCr WNL and CrCL > 62ml/min  No known bleeding  Taking PO meds since 9/28a  Goal of Therapy:  Anti-Xa level 0.6-1 units/ml 4hrs after LMWH dose given Monitor platelets by anticoagulation protocol: Yes   Plan:   Lovenox 70 mg SQ q12h  Monitor SCr, Hgb, plt   Monitor for signs and symptoms of bleeding  Resume apixaban once can confidently tolerate   Doreene Eland, PharmD, BCPS.   Pager: [phone] 09/30/2015 8:42 AM   Addendum:  Orders to convert from [place] to apixaban now that tolerating soft diet.  Last dose enoxaparin 12:15 9/29  Plan:  apixaban 5mg  PO bid starting tonight at 22:00 (10hr after last enoxaparin dose)  Doreene Eland, PharmD, BCPS.   Pager: [phone] 09/30/2015 1:27 PM

## 2015-09-30 NOTE — Progress Notes (Signed)
PROGRESS NOTE    [name]  [id number] DOB: [birth year] DOA: 09/23/2015  PCP: [facility]   Brief Narrative:  [name] is a 60 y.o. 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 She presented to the emergency department after suffering 1 day of significant worsening nausea and vomiting with associated chest pain that started this morning. Chest pain made worse with vomiting. No hematemesis. Pain does not radiate. She took an aspirin which did not help with her pain. She has been receiving radiation therapy for her metastatic disease and was told that the radiation could be causing the nausea and vomiting, CT head obtained secondary to fall during hospital stay significant for metastasis. Significant improvement of nausea and vomiting after starting Decadron for brain metastasis,   Subjective: Denies any nausea or vomiting, reports appetite has improved   Assessment & Plan:  Nausea & vomiting with fever - CT abd/ pelvis unrevealing, LFTs not suggestive of liver/ gallbladder issues, lipase normal - nausea and vomiting began when she began radiation to the chest , possibly related to radiation, as well patient with a new diagnosis of brain metastasis, she reports significant improvement of her symptoms after she was started on Decadron, so nausea and vomiting more likely related to new brain metastases. 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 Vertell Limber from neurosurgery prior to her treatment for radiation oncology. - Continue with Decadron for  edema and Keppra for seizure prophylaxis - By oncology     Fever - initially suspected that vomiting and fever was possible related to a gastroenteritis   - temp 102 on 9/22 starting after being admitted to the hospital- it has not recurred - UA negative, blood cultures NGTD - no respiratory symptoms-CT chest, abd, pelvis unrevealing- no diarrhea  Chest pain  - likely due to vomiting and musculoskeletal- started Voltaren gel- weaned off Fentanyl - troponin negative- EKG unchanged from prior   H/o LLE DVT in 2/17 -Patient receiving Eliquis at home, was on Lovenox as wasn't able to straight oral intake, will resume back on a Eliquis today given she Tolerate oral.   Essential hypertension - on Clonidine, labetalol - hold HCTZ while she is dehydrated,  was on clonidine patch as wasn't able to tolerate oral, given or good oral intake now will transition back to clonidine home dose, she was started on hydralazine for poorly controlled blood pressure   Cancer related pain - Palliative medicine consult appreciated Anemia of chronic disease - baseline is between 8-10- last anemia panel in 2/17 consistent with AOCD - Glycohemoglobin is 7.8 today, will transfuse 1 unit PRBC today    Pericardial effusion - noted on CT chest in ER- this is mild and does not need further work up at this point   DVT prophylaxis: lovneox>>apixaban Code Status: Full code (previously made DNR by Dr Marko Plume but now wants Full code) Family Communication: None at bedside Disposition Plan: home with home pt IN AM. Consultants:  Radiation oncology  Oncology  Palliative Procedures:    Antimicrobials:  Anti-infectives    None       Objective: Vitals:   09/29/15 0622 09/29/15 1432 09/30/15 0055 09/30/15 0539  BP: (!) 176/90 (!) 172/86 139/88 (!)  172/80  Pulse: 92 88 77 97  Resp: 18 16 20 18   Temp: 97.8 F (36.6 C) 98.1 F (36.7 C) 98.2 F (36.8 C) 98.4 F (36.9 C)  TempSrc: Oral Oral  Oral  SpO2: 100% 99%  95%  Weight: 70.1 kg (154 lb 8.7 oz)   70.1 kg (154 lb 8.7 oz)  Height:        Intake/Output Summary (Last 24 hours) at 09/30/15 1312 Last data filed at 09/30/15 0539  Gross per 24 hour  Intake              400 ml  Output                0 ml  Net              400 ml   Filed Weights   09/28/15 0430 09/29/15 0622 09/30/15 0539  Weight: 70.3 kg (154 lb 15.7 oz) 70.1 kg (154 lb 8.7 oz) 70.1 kg (154 lb 8.7 oz)    Examination: General exam: Appears comfortable  HEENT: PERRLA, oral mucosa moist, no sclera icterus or thrush Respiratory system: Clear to auscultation. Respiratory effort normal. Cardiovascular system: S1 & S2 heard, RRR.  No murmurs , left side chest tenderness to palpation Chest: tender in right chest wall in area of 3rd & 4th rib Gastrointestinal system: Abdomen soft, non-tender , nondistended. Normal bowel sound. No organomegaly Central nervous system: Alert and oriented. No focal neurological deficits. Extremities: No cyanosis, clubbing or edema Skin: No rashes or ulcers Psychiatry:  Mood & affect appropriate.     Data Reviewed: I have personally reviewed following labs and imaging studies  CBC:  Recent Labs Lab 09/26/15 1240 09/27/15 0938 09/28/15 0550 09/29/15 0515 09/30/15 0530  WBC 6.5 5.4 4.5 7.1 10.3  HGB 7.3* 7.5* 7.5* 8.5* 7.8*  HCT 23.1* 23.4* 23.2* 25.7* 24.1*  MCV 77.3* 76.2* 72.7* 72.0* 74.8*  PLT 283 322 277 404* [id number]*   Basic Metabolic Panel:  Recent Labs Lab 09/24/15 0417 09/25/15 0357 09/28/15 0550 09/29/15 0515 09/30/15 0530  NA 137 138 138 136 137  K 4.2 4.0 2.6* 3.7 3.4*  CL 107 110 105 103 109  CO2 23 21* 24 21* 22  GLUCOSE 119* 96 99 121* 130*  BUN 13 12 <5* 6 12  CREATININE 0.98 0.84 0.72 0.76 0.95  CALCIUM 7.9* 7.6* 8.6* 8.9 8.2*  MG  --   --  1.4*   --   --    GFR: Estimated Creatinine Clearance: 57.7 mL/min (by C-G formula based on SCr of 0.95 mg/dL). Liver Function Tests: No results for input(s): AST, ALT, ALKPHOS, BILITOT, PROT, ALBUMIN in the last 168 hours. No results for input(s): LIPASE, AMYLASE in the last 168 hours. No results for input(s): AMMONIA in the last 168 hours. Coagulation Profile: No results for input(s): INR, PROTIME in the last 168 hours. Cardiac Enzymes: No results for input(s): CKTOTAL, CKMB, CKMBINDEX, TROPONINI in the last 168 hours. BNP (last 3 results) No results for input(s): PROBNP in the last 8760 hours. HbA1C: No results for input(s): HGBA1C in the last 72 hours. CBG:  Recent Labs Lab 09/30/15 0747  GLUCAP 133*   Lipid Profile: No results for input(s): CHOL, HDL, LDLCALC, TRIG,  CHOLHDL, LDLDIRECT in the last 72 hours. Thyroid Function Tests: No results for input(s): TSH, T4TOTAL, FREET4, T3FREE, THYROIDAB in the last 72 hours. Anemia Panel: No results for input(s): VITAMINB12, FOLATE, FERRITIN, TIBC, IRON, RETICCTPCT in the last 72 hours. Urine analysis:    Component Value Date/Time   COLORURINE AMBER (A) 09/25/2015 0210   APPEARANCEUR CLOUDY (A) 09/25/2015 0210   LABSPEC 1.028 09/25/2015 0210   PHURINE 5.0 09/25/2015 0210   GLUCOSEU NEGATIVE 09/25/2015 0210   HGBUR NEGATIVE 09/25/2015 0210   BILIRUBINUR SMALL (A) 09/25/2015 0210   KETONESUR NEGATIVE 09/25/2015 0210   PROTEINUR NEGATIVE 09/25/2015 0210   UROBILINOGEN 0.2 03/03/2008 1216   NITRITE NEGATIVE 09/25/2015 0210   LEUKOCYTESUR SMALL (A) 09/25/2015 0210   Sepsis Labs: @LABRCNTIP (procalcitonin:4,lacticidven:4) ) Recent Results (from the past 240 hour(s))  Culture, blood (Routine X 2) w Reflex to ID Panel     Status: None (Preliminary result)   Collection Time: 09/24/15  3:20 PM  Result Value Ref Range Status   Specimen Description BLOOD RIGHT ARM  Final   Special Requests BOTTLES DRAWN AEROBIC AND ANAEROBIC 5CC EA  Final    Culture   Final    NO GROWTH 4 DAYS Performed at [facility]    Report Status PENDING  Incomplete  Culture, blood (Routine X 2) w Reflex to ID Panel     Status: None (Preliminary result)   Collection Time: 09/24/15  3:20 PM  Result Value Ref Range Status   Specimen Description BLOOD RIGHT HAND  Final   Special Requests BOTTLES DRAWN AEROBIC AND ANAEROBIC 5CC EA  Final   Culture   Final    NO GROWTH 4 DAYS Performed at [facility]    Report Status PENDING  Incomplete         Radiology Studies: Mr Jeri Cos Wo Contrast  Result Date: 09/29/2015 CLINICAL DATA:  Nausea and vomiting, chest pain for 1 day. History of metastatic endometrial and ovarian cancer, on radiation therapy. Followup probable LEFT cerebellar mass. EXAM: MRI HEAD WITHOUT AND WITH CONTRAST TECHNIQUE: Multiplanar, multiecho pulse sequences of the brain and surrounding structures were obtained without and with intravenous contrast. CONTRAST:  68mL MULTIHANCE GADOBENATE DIMEGLUMINE 529 MG/ML IV SOLN COMPARISON:  CT HEAD September 28, 2015 at 1010 hours. FINDINGS: BRAIN: T2 bright heterogeneously enhancing 2 x 2.3 x 2 cm (transverse by AP by CC) LEFT cerebellar mass corresponding to C2 abnormality. Faint susceptibility artifact along the margins. Surrounding T2 bright vasogenic edema with mild mass effect on the fourth ventricle which remains patent. No additional intraparenchymal mass. Supratentorial ventricles are normal. A few scattered sub cm supratentorial white matter FLAIR T2 hyperintensities compatible with mild chronic small vessel ischemic disease, within normal range for patient's age. No abnormal extra-axial fluid collections, extra-axial enhancement or extra-axial masses. VASCULAR: Normal major intracranial vascular flow voids present at skull base. SKULL AND UPPER CERVICAL SPINE: No abnormal sellar expansion. No suspicious calvarial bone marrow signal. Craniocervical junction maintained. Torus  palatini. LEFT parietal exostosis without enhancement. SINUSES/ORBITS: The mastoid air-cells and included paranasal sinuses are well-aerated. The included ocular globes and orbital contents are non-suspicious. OTHER: None. 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 Vasogenic edema with mild mass effect on the fourth ventricle, no obstructive hydrocephalus. Otherwise negative MRI of the head for age. Electronically Signed   By: Elon Alas M.D.   On: 09/29/2015 03:52      Scheduled Meds: . sodium chloride   Intravenous Once  .  cloNIDine  0.1 mg Transdermal Weekly  . dexamethasone  4 mg Oral Q6H  . diclofenac sodium  2 g Topical QID  . enoxaparin (LOVENOX) injection  70 mg Subcutaneous Q12H  . feeding supplement (ENSURE ENLIVE)  237 mL Oral BID BM  . hydrALAZINE  75 mg Oral Q6H  . levETIRAcetam  500 mg Oral BID  . metoCLOPramide (REGLAN) injection  5 mg Intravenous TID AC & HS  . metoprolol  5 mg Intravenous Q6H  . ondansetron  4 mg Oral Q6H   Or  . ondansetron (ZOFRAN) IV  4 mg Intravenous Q6H  . pantoprazole  40 mg Oral BID AC  . potassium chloride  40 mEq Oral Once  . potassium chloride  40 mEq Oral Once  . senna-docusate  1 tablet Oral BID   Continuous Infusions: . sodium chloride 50 mL/hr at 09/29/15 1330     LOS: 6 days     Heriberto Stmartin, MD [facility] [email or web address] Password [email or web address] 09/30/2015, 1:12 PM

## 2015-09-30 NOTE — Progress Notes (Signed)
  Radiation Oncology         [phone] ________________________________  Name: [name] MRN: [id number]  Date: 09/30/2015  DOB: [birth year]  SIMULATION AND TREATMENT PLANNING NOTE    ICD-9-CM ICD-10-CM   1. Brain metastases ([place]) 198.3 C79.31     DIAGNOSIS:  60 yo woman with a left cerebellar 2.3 cm brain metastasis from   NARRATIVE:  The patient was brought to the [facility].  Identity was confirmed.  All relevant records and images related to the planned course of therapy were reviewed.  The patient freely provided informed written consent to proceed with treatment after reviewing the details related to the planned course of therapy. The consent form was witnessed and verified by the simulation staff. Intravenous access was established for contrast administration. Then, the patient was set-up in a stable reproducible supine position for radiation therapy.  A relocatable thermoplastic stereotactic head frame was fabricated for precise immobilization.  CT images were obtained.  Surface markings were placed.  The CT images were loaded into the planning software and fused with the patient's targeting MRI scan.  Then the target and avoidance structures were contoured.  Treatment planning then occurred.  The radiation prescription was entered and confirmed.  I have requested 3D planning  I have requested a DVH of the following structures: Brain stem, brain, left eye, right eye, lenses, optic chiasm, target volumes, uninvolved brain, and normal tissue.    SPECIAL TREATMENT PROCEDURE:  The planned course of therapy using radiation constitutes a special treatment procedure. Special care is required in the management of this patient for the following reasons. This treatment constitutes a Special Treatment Procedure for the following reason: High dose per fraction requiring special monitoring for increased toxicities of treatment including daily imaging.  The special nature of the  planned course of radiotherapy will require increased physician supervision and oversight to ensure patient's safety with optimal treatment outcomes.  PLAN:  The patient will receive 18 Gy in 1 fraction.  ________________________________  Sheral Apley Tammi Klippel, M.D.

## 2015-09-30 NOTE — Consult Note (Addendum)
Consultation Note Date: 09/30/2015   Patient Name: [name]  DOB: [birth year]  MRN: [id number]  Age / Sex: 60 y.o., female  PCP: [name] Referring Physician: Albertine Patricia, MD  Reason for Consultation: Establishing goals of care  HPI/Patient Profile: 60 y.o. 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   Clinical Assessment and Goals of Care:  I met with [name] in conjunction with her aunt and daughter.  We discussed what is most important to her, and she reports that her family, her faith, being at home, and feeling as well as possible for as long as possible are the things that are most important to her.  She reports that her doctors have been doing a good job talking with her and she has clear understanding that she has cancer with newly discovered brain lesion and that this is not a curable disease.  SUMMARY OF RECOMMENDATIONS   - She reports that her goal is to live as well as possible for as long as possible.  We discussed continued pursuit of therapies that are likely to add time and quality to her life. Also introduced the concept that there may be interventions that would be unlikely to result in her being well enough to be out of the hospital and spend time in her faith and with her family, such as ICU level care or heroic measures at the end of her life.  - She reports to me today that she does not want to be "hooked up to life support,"  but she also reports that she "wants to keep fighting".  Reviewed plan for SRS surgery next week.  She states that she is not going to consider any other changes to her care plan prior to this and wants to have her family involved in conversation. - We discussed who  would be the best person to make decisions in the event that she cannot make them for herself. She reports that her husband is her surrogate decision maker (in conjunction with her daughter).   - Unfortunately, it does not appear that [name]'s family is able/willing to meet with our team at this time. She reports that having her family present for conversation is necessary in order for her to continue to talk about long-term goals.  I offered to meet with her anytime prior to her discharge, which she politely declined.   - I provided her with a copy of [facility] review as well as my card. She will call if she desires to meet again prior to her discharge.   Code Status/Advance Care Planning:  Full code  Palliative Prophylaxis:   Aspiration, Delirium Protocol and Frequent Pain Assessment  Additional Recommendations (Limitations, Scope, Preferences):  Full Scope Treatment  Prognosis:   Unable to determine  Discharge Planning: Home with Home Health  Primary Diagnoses: Present on Admission: . Nausea & vomiting . Anemia of chronic disease . Endometrial cancer ([place]) . Essential hypertension . Nausea and vomiting   I have reviewed the medical record, interviewed the patient and family, and examined the patient. The following aspects are pertinent.  Past Medical History:  Diagnosis Date  . Chemotherapy-induced neuropathy ([place])   . DVT, lower extremity ([place])    left  02-12-2015  currently on Eliqius  . Endometrial cancer [facility]) oncologist-  dr [name]   02-2015  . 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 Family history of breast cancer   . History of adenomatous polyp of colon    tubulovillious adenoma 09/ 2010  . History of ovarian cyst    complex  . History of radiation therapy 10/21/08-11/23/08 & 12/07/10/13/12/23 2010   ENDOMETRIOID   . History of uterine fibroid   . Hypertension   . Inguinal fluid collection 02/2015   right-drained  x2  . Iron deficiency anemia    chronic severe  . Metastasis to lung ([place])   . 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 Marital status: Married    Spouse name: N/A  . Number of children: 3  . Years of education: N/A   Social History Main Topics  . Smoking status: Never Smoker  . Smokeless tobacco: Never Used  . Alcohol use No  . Drug use: No  . Sexual activity: Not Asked   Other Topics Concern  . None   Social History Narrative  . None   Family History  Problem Relation Age of Onset  . Bone cancer Paternal Grandmother     OSSEOUS METASTASIS  . Heart disease Father   . Heart disease Sister   . Bone cancer Maternal Grandmother 75  . Leukemia Maternal Grandfather 72  . Liver cancer Maternal Uncle   . Breast cancer Cousin 73    maternal first cousin   Scheduled Meds: . apixaban  5 mg Oral BID  . cloNIDine  0.1 mg Oral Daily  . dexamethasone  4 mg Oral Q6H  . diclofenac sodium  2 g Topical QID  . feeding supplement (ENSURE ENLIVE)  237 mL Oral BID BM  . hydrALAZINE  75 mg Oral Q6H  . labetalol  200 mg Oral TID  . levETIRAcetam  500 mg Oral BID  . metoCLOPramide (REGLAN) injection  5 mg Intravenous TID AC & HS  . ondansetron  4 mg Oral Q6H   Or  . ondansetron (ZOFRAN) IV  4 mg Intravenous Q6H  . pantoprazole  40 mg Oral BID AC  . senna-docusate  1 tablet Oral BID   Continuous Infusions: . sodium chloride 50 mL/hr at 09/30/15 1612   PRN Meds:.diphenhydrAMINE, hydrALAZINE, LORazepam, morphine injection, polyethylene glycol, promethazine, sodium chloride flush Medications Prior to Admission:  Prior to Admission medications   Medication Sig Start Date End Date Taking? Authorizing Provider  apixaban (ELIQUIS) 5 MG TABS tablet Take 1 tablet (5 mg total) by mouth 2 (two) times daily. 03/03/15  Yes Lisa Marion Downer, MD  benzonatate (TESSALON) 100 MG capsule Take 1 capsule (100 mg total) by mouth 3 (three) times daily as needed for cough.  08/22/15  Yes Lisa Marion Downer, MD  cloNIDine (CATAPRES) 0.1 MG tablet Take 0.1 mg by mouth daily.  02/25/15  Yes Historical Provider, MD  hydrochlorothiazide (HYDRODIURIL) 25 MG tablet Take 25 mg by mouth daily.   11/01/08  Yes  Historical Provider, MD  labetalol (NORMODYNE) 100 MG tablet Take 2 tablets (200 mg total) by mouth 3 (three) times daily. 08/25/15  Yes Lisa Marion Downer, MD  letrozole (FEMARA) 2.5 MG tablet Take 1 tablet (2.5 mg total) by mouth daily. 08/18/15  Yes Lisa Marion Downer, MD  lidocaine-prilocaine (EMLA) cream Apply to Porta-cath 1-2 hrs prior to access as directed. Patient taking differently: Apply 1 application topically as needed (Apply to port-a-cath 1-2 hours prior to access.).  08/04/15  Yes Lisa Marion Downer, MD  LORazepam (ATIVAN) 0.5 MG tablet Place 1 tablet under the tongue or swallow every 6 hrs as needed for nausea. 09/06/15  Yes Gery Pray, MD  morphine (MSIR) 15 MG tablet Take 1 tablet (15 mg total) by mouth every 6 (six) hours as needed for moderate pain or severe pain. 08/04/15  Yes Lisa Marion Downer, MD  ondansetron (ZOFRAN ODT) 8 MG disintegrating tablet Take 1 tablet (8 mg total) by mouth every 8 (eight) hours as needed for nausea or vomiting. 09/12/15  Yes Tatyana Kirichenko, PA-C  pantoprazole (PROTONIX) 40 MG tablet Take 1 tablet (40 mg total) by mouth daily. 03/14/15  Yes Lisa Marion Downer, MD  Pediatric Multivit-Minerals-C (FLINTSTONES GUMMIES PO) Take 1 tablet by mouth daily.   Yes Historical Provider, MD  polyethylene glycol (MIRALAX / GLYCOLAX) packet Take 17 g by mouth 2 (two) times daily as needed for mild constipation. Reported on 05/23/2015   Yes Historical Provider, MD  potassium chloride (K-DUR) 10 MEQ tablet Take 2 tablets today and then 1 tablet daily 05/23/15  Yes Lisa Marion Downer, MD  promethazine (PHENERGAN) 25 MG tablet Take 1 tablet (25 mg total) by mouth every 6 (six) hours as needed for nausea or vomiting. 09/15/15  Yes Gery Pray, MD  sucralfate  (CARAFATE) 1 GM/10ML suspension Take 10 mLs (1 g total) by mouth 4 (four) times daily -  with meals and at bedtime. 09/14/15  Yes Gery Pray, MD  Wound Dressings (SONAFINE EX) Apply 1 application topically as needed (For wound dressing.).    Yes Historical Provider, MD  chlorpheniramine-HYDROcodone (TUSSIONEX) 10-8 MG/5ML SUER Take 5 ml by mouth every 8-12 hours as needed for cough. Patient not taking: Reported on 09/23/2015 08/15/15   Gery Pray, MD  dexamethasone (DECADRON) 4 MG tablet Take 2 tablets (8 mg total) by mouth 2 (two) times daily. For 3 days.  Start the day before Taxotere. Patient not taking: Reported on 09/23/2015 06/09/15   Gordy Levan, MD  ferrous sulfate 325 (65 FE) MG tablet Take 1 tablet (325 mg total) by mouth 2 (two) times daily with a meal. Patient not taking: Reported on 09/23/2015 02/17/15   Robbie Lis, MD  HYDROmorphone (DILAUDID) 2 MG tablet Take 1-2 tablets (2-4 mg total) by mouth every 4 (four) hours as needed for severe pain. Patient not taking: Reported on 09/23/2015 08/18/15   Gordy Levan, MD  zolpidem (AMBIEN) 5 MG tablet Take 1 tablet (5 mg total) by mouth at bedtime as needed for sleep. Patient not taking: Reported on 09/23/2015 09/02/15   Gordy Levan, MD   Allergies  Allergen Reactions  . Morphine And Related Itching and Other (See Comments)    Pruritis with morphine - depends on strength of medication  . Dilaudid [Hydromorphone Hcl] Itching and Nausea And Vomiting   Review of Systems  Constitutional: Positive for activity change and fatigue.  Gastrointestinal: Positive for constipation and nausea.  Musculoskeletal: Positive for back pain.  Psychiatric/Behavioral: Positive for  confusion and sleep disturbance.    Physical Exam General: Alert, awake, in no acute distress.  HEENT: No bruits, no goiter, no JVD Heart: Regular rate and rhythm. No murmur appreciated. Lungs: Good air movement on R, diminished on L, clear Abdomen: Soft, nontender,  nondistended, positive bowel sounds.  Ext: No significant edema Skin: Warm and dry Neuro: Grossly intact, nonfocal.  Vital Signs: BP (!) 163/80   Pulse 72   Temp 98 F (36.7 C) (Oral)   Resp 18   Ht 5' 3"  (1.6 m)   Wt 70.1 kg (154 lb 8.7 oz)   SpO2 100%   BMI 27.38 kg/m  Pain Assessment: 0-10   Pain Score: 3    SpO2: SpO2: 100 % O2 Device:SpO2: 100 % O2 Flow Rate: .   IO: Intake/output summary:  Intake/Output Summary (Last 24 hours) at 09/30/15 1748 Last data filed at 09/30/15 1600  Gross per 24 hour  Intake             1070 ml  Output                0 ml  Net             1070 ml    LBM: Last BM Date: 09/27/15 Baseline Weight: Weight: 67.1 kg (148 lb) Most recent weight: Weight: 70.1 kg (154 lb 8.7 oz)     Palliative Assessment/Data:   Flowsheet Rows   Flowsheet Row Most Recent Value  Intake Tab  Referral Department  Oncology  Unit at Time of Referral  Oncology Unit  Palliative Care Primary Diagnosis  Cancer  Date Notified  09/29/15  Palliative Care Type  New Palliative care  Reason for referral  Clarify Goals of Care  Date of Admission  09/23/15  Date first seen by Palliative Care  09/29/15  # of days Palliative referral response time  0 Day(s)  # of days IP prior to Palliative referral  6  Clinical Assessment  Palliative Performance Scale Score  50%  Pain Max last 24 hours  4  Pain Min Last 24 hours  0  Psychosocial & Spiritual Assessment  Palliative Care Outcomes  Palliative Care Outcomes  ACP counseling assistance      Time In: 1003 Time Out: 4961 Time Total: 40 Greater than 50%  of this time was spent counseling and coordinating care related to the above assessment and plan.  Signed by: Micheline Rough, MD   Please contact Palliative Medicine Team phone at [phone] for questions and concerns.  For individual provider: See Shea Evans

## 2015-09-30 NOTE — Progress Notes (Signed)
Physical Therapy Treatment Patient Details Name: [name] MRN: [id number] DOB: 1955-12-07 Today's Date: 09/30/2015    History of Present Illness Pt admitted through ED with c/o N/V and chest pain. Imaging showed new met to brain.  Pt with hx of Met CA, chronic anemia, and Chemo-induced neuropathy    PT Comments    Pt progressing with mobility, decreased assistance needed for bed mobility and transfers.  She ambulated 43' with RW today, distance limited by fatigue, noted Hgb 7.8 today.   Follow Up Recommendations  Home health PT     Equipment Recommendations  None recommended by PT    Recommendations for Other Services OT consult     Precautions / Restrictions Precautions Precautions: Fall Restrictions Weight Bearing Restrictions: No    Mobility  Bed Mobility Overal bed mobility: Needs Assistance Bed Mobility: Rolling;Sidelying to Sit Rolling: Modified independent (Device/Increase time) Sidelying to sit: Modified independent (Device/Increase time)       General bed mobility comments: instructed pt in log roll with bed flat, used rail to push up into sitting, VCs for technique but no physical assist needed  Transfers Overall transfer level: Needs assistance Equipment used: Rolling walker (2 wheeled) Transfers: Sit to/from Stand Sit to Stand: Min guard         General transfer comment: VCs for hand placement and to scoot to EOB, steady in standing  Ambulation/Gait Ambulation/Gait assistance: Min guard Ambulation Distance (Feet): 80 Feet Assistive device: Rolling walker (2 wheeled) Gait Pattern/deviations: Step-through pattern;Decreased step length - right;Decreased step length - left Gait velocity: decr Gait velocity interpretation: Below normal speed for age/gender General Gait Details: no LOB, distance limited by fatigue, noted Hgb 7.8 today and pt is receiving blood at present   Stairs            Wheelchair Mobility    Modified Rankin  (Stroke Patients Only)       Balance Overall balance assessment: Needs assistance   Sitting balance-Leahy Scale: Good       Standing balance-Leahy Scale: Fair                      Cognition Arousal/Alertness: Awake/alert Behavior During Therapy: WFL for tasks assessed/performed;Flat affect Overall Cognitive Status: Within Functional Limits for tasks assessed                      Exercises      General Comments        Pertinent Vitals/Pain Pain Assessment: 0-10 Pain Score: 1  Pain Location: abdomen Pain Descriptors / Indicators: Aching Pain Intervention(s): Limited activity within patient's tolerance;Monitored during session    Home Living                      Prior Function            PT Goals (current goals can now be found in the care plan section) Acute Rehab PT Goals Patient Stated Goal: to walk farther PT Goal Formulation: With patient Time For Goal Achievement: 10/08/15 Potential to Achieve Goals: Good Progress towards PT goals: Progressing toward goals    Frequency    Min 3X/week      PT Plan Current plan remains appropriate    Co-evaluation             End of Session Equipment Utilized During Treatment: Gait belt Activity Tolerance: Patient limited by fatigue;Patient tolerated treatment well Patient left: with call bell/phone within reach;with family/visitor present;in  chair     Time: 5486-2824 PT Time Calculation (min) (ACUTE ONLY): 22 min  Charges:  $Gait Training: 8-22 mins                    G Codes:      Philomena Doheny 09/30/2015, 2:42 PM [phone]

## 2015-09-30 NOTE — Progress Notes (Signed)
September 30, 2015, 9:55 AM  Hospital day 8 Antibiotics: none Chemotherapy: gemzar 08-11-15, held since then with RT to chest and now to brain Zometa 08-18-15  Outpatient Physicians:E.Rossi, J.Kinard, C.Windham, P.Lucianne Lei Trigt Verl Blalock, [facility]), L.Kataleyah Carducci  EMR reviewed. Patient seen, with daughter and another family member at bedside. Spoke with RN also at bedside.  Subjective: Feeling better. Slept well. Appetite so good yesterday that she felt uncomfortable from overeating. Last BM 2 days ago, may need to increase bowel regimen with pain meds. Tolerating morphine without itching now. For radiation simulation today. Per family, more steady up to [facility] this AM. Denies SOB, only occasional NP cough.    PAC placed by IR on 08-10-15 OnEliquis outpatient for LLE DVT  Genetics testing negative Breast Ovarian and gyn panel by [facility] 06-14-15.  CA 125 on 02-14-15 642.9 ER + on cytology 02-2015 Feraheme 02-16-15 and [id number].   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 She was lost to follow up for the gyn cancer after 11-2010, until presented with recurrent disease. Patient reported right inguinal mass for ~ 2 months, then LLE pain for ~ 3 weeks when she presented to ED on 02-12-15. CT AP showed nodules in lung bases suspicious for metastatic disease, liver not remarkable, no hydronephrosis, 6 mm right renal calculus, thrombus left external iliac vein into left common femoral vein, retroperitoneal and pelvic adenopathy, an 8.5 x 7.1 cm necrotic right inguinal node, post hyst ooph, no ascites, likely tumor involvement in left psoas with adjacent involvement of L4  and L5 vertebral bodies; CT chest subsequently had multiple bilateral pulmonary nodules and central adenopathy. Patient was begun on heparin qtt which was transitioned to Eliquis (on pharmaceutical assistance program). CA 125 from 02-14-15 was 642.9, this having been 3.6 in 08-2010. Pathology from right inguinal nodal mass aspiration and core needle biopsy 02-15-15 necrotic material with metastatic adenocarcinoma with immunostains consistent with gyn primary ([id number], ER+). She was transfused 2 units PRBCs for hgb 6.7. Iron studies 2-12 with serum iron 14 and %sat 7, given feraheme x2. Dr Denman George saw in consultation prior to starting carboplatin taxotere on 02-24-15. She reacted to [place] skin test prior to cycle 3, treatment continued with taxotere only thru cycle 5 on 05-26-15, tolerated neulasta poorly. She had I&D and capsulectomy of seroma cavity right inguinal region on 05-12-15. CT CAP 06-14-15 significant improvement in all areas of metastatic disease with exception of enlarging cystic mass in left hilum. Echocardiogram 07-19-15 done to evaluate central chest mass: EF 60-65% and minimal pericardial effusion. PET 08-01-15 showedsignificant progression of multiple areas of disease in addition to the chest involvement. She had first gemzar 08-11-15, then began letrozole on 08-18-15 in attempt to get some systemic coverage as she was receiving chest radiation. Radiation to left chest completed 09-15-15, with improvement in cough and in SOB. 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     Objective: Vital signs in last 24 hours: Blood pressure (!) 172/80, pulse 97, temperature 98.4 F (36.9 C), temperature source Oral, resp. rate 18, height 5' 3"  (1.6 m), weight 154 lb 8.7 oz (70.1 kg), SpO2 95 %. Oral mucosa moist, no thrush or lesions. Mucous membranes and nailbeds pale.  PERRL. Speech fluent and appropriate. Lungs diminished BS thru all of left lung  field, no wheezes or rales. Heart RRR. PAC functioning well for blood draw now. Abdomen soft, + BS, not tender. LE no pedal edema, feet warm. Moves extremities in bed.   Intake/Output from previous day: 09/28 0701 - 09/29 0700 In: 400 [P.O.:400] Out: -  Intake/Output this shift: No intake/output data recorded.   Lab Results:  Recent Labs  09/29/15 0515 09/30/15 0530  WBC 7.1 10.3  HGB 8.5* 7.8*  HCT 25.7* 24.1*  PLT 404* 409*   BMET  Recent Labs  09/29/15 0515 09/30/15 0530  NA 136 137  K 3.7 3.4*  CL 103 109  CO2 21* 22  GLUCOSE 121* 130*  BUN 6 12  CREATININE 0.76 0.95  CALCIUM 8.9 8.2*    Studies/Results: CT and MRI head as previously   DISCUSSION: Usefulness of steroids as initial intervention for brain met discussed, as well as appetite effects with that. Agree with PRBCs for hemoglobin down to 7.8 now.   Appreciate Dr Kirstie Mirza involvement  Assessment/Plan: 1.metastatic adenocarcinoma of gyn primary, in patient with history of IC clear cell ovarian and IA high grade endometrial carcinomas 03-2008 (incomplete staging), post TAH BSO, adjuvant carboplatin taxol x 6 cycles and radiation. Lost to follow up after 11-2010 until presented with metastatic gyn cancer 02-2015. Treated with carbo taxane in palliative attempt with initial response,; I&D with capsulectomy of seroma cavity right groin  05-12-15 withadenocarcinoma   in that path. Extensive progression in chest with radiation thru 09-15-15. New cerebellar met, steroids begun and radiation anticipated.   2.Code status: she has advanced and incurable metastatic cancer. Patient would like family discussion with palliative care team, which is being coordinated. She has told me in 2 conversations that she does not want to be on life support.  3.LLE DVT documented 02-12-15: on Eliquis PTA, now lovenox. No IVC filter.No apparent bleeding with the cerebellar met and no other overt bleeding.  Eliquis approved thru [employer] assistance x 1 year. She is certainly at high risk for further clots given active metastatic cancer, but also high risk falls with cerebellar met.  4. PRBCs given 08-08-15 for symptomatic anemia. She has had feraheme x 2 in [facility] 2017. Agree with PRBCs. 5..uncontrolled HTN on presentation with metastatic disease. She has basically refused to establish with a PCP. 6..Itching with morphine, nausea with dilaudid. 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 With extensive metastatic disease, she would not be candidate for much intervention in this regard as long as not significantly symptomatic.  11. Needs flu vaccine this fall  I will follow up next week if still in hospital.  Lisa Clines MD  Pager [phone]

## 2015-10-01 MED ORDER — MORPHINE SULFATE 15 MG PO TABS: 15.0000 mg | ORAL_TABLET | Freq: Four times a day (QID) | ORAL | 0 refills | Status: DC | PRN

## 2015-10-01 MED ORDER — LEVETIRACETAM 500 MG PO TABS: 500.0000 mg | ORAL_TABLET | Freq: Two times a day (BID) | ORAL | 0 refills | Status: DC

## 2015-10-01 MED ORDER — ENSURE ENLIVE PO LIQD: 237.0000 mL | Freq: Two times a day (BID) | ORAL | 12 refills | Status: DC

## 2015-10-01 MED ORDER — DEXAMETHASONE 4 MG PO TABS: 4.0000 mg | ORAL_TABLET | Freq: Three times a day (TID) | ORAL | 0 refills | Status: DC

## 2015-10-01 MED ORDER — HEPARIN SOD (PORK) LOCK FLUSH 100 UNIT/ML IV SOLN: 500.0000 [IU] | Freq: Once | INTRAVENOUS | Status: DC

## 2015-10-01 MED ORDER — DEXAMETHASONE 4 MG PO TABS: 8.0000 mg | ORAL_TABLET | Freq: Two times a day (BID) | ORAL | 1 refills | Status: DC

## 2015-10-01 MED ORDER — SENNOSIDES-DOCUSATE SODIUM 8.6-50 MG PO TABS: 1.0000 | ORAL_TABLET | Freq: Two times a day (BID) | ORAL | Status: DC

## 2015-10-01 NOTE — Care Management Note (Signed)
Case Management Note  Patient Details  Name: [name] MRN: [id number] Date of Birth: Aug 31, 1955  Subjective/Objective:    HTN,  Endometrial Cancer            Action/Plan: Discharge Planning: AVS reviewed:  NCM spoke to pt and husband, Jeneen Rinks at bedside. Pt states she could not afford to pay for [facility] PT out of pocket. Explained her benefits for [facility] PT with Medicaid is limited.  Pt states she has RW and requesting tub bench for home. Husband will pick up from [facility] store. Provided pt with Rx.   PCP- [facility]  Expected Discharge Date:  10/01/2015          Expected Discharge Plan:  Home/Self Care  In-House Referral:  NA  Discharge planning Services  CM Consult  Post Acute Care Choice:  NA Choice offered to:  NA  DME Arranged:  Tub bench DME Agency:  [place]:  NA [place] Agency:  NA  Status of Service:  Completed, signed off  If discussed at [place] of Stay Meetings, dates discussed:    Additional Comments:  Erenest Rasher, RN 10/01/2015, 12:56 PM

## 2015-10-01 NOTE — Progress Notes (Signed)
Patient discharged to home with family, discharge instructions reviewed with patient and family who verbalized understanding. New RX's given to family.

## 2015-10-01 NOTE — Discharge Instructions (Signed)
Follow with Primary MD [place] in 7 days   Get CBC, CMP, checked  by Primary MD next visit.    Activity: As tolerated with Full fall precautions use walker/cane & assistance as needed   Disposition Home    Diet: regular diet , with feeding assistance and aspiration precautions.  For Heart failure patients - Check your Weight same time everyday, if you gain over 2 pounds, or you develop in leg swelling, experience more shortness of breath or chest pain, call your Primary MD immediately. Follow Cardiac Low Salt Diet and 1.5 lit/day fluid restriction.   On your next visit with your primary care physician please Get Medicines reviewed and adjusted.   Please request your Prim.MD to go over all Hospital Tests and Procedure/Radiological results at the follow up, please get all Hospital records sent to your Prim MD by signing hospital release before you go home.   If you experience worsening of your admission symptoms, develop shortness of breath, life threatening emergency, suicidal or homicidal thoughts you must seek medical attention immediately by calling 911 or calling your MD immediately  if symptoms less severe.  You Must read complete instructions/literature along with all the possible adverse reactions/side effects for all the Medicines you take and that have been prescribed to you. Take any new Medicines after you have completely understood and accpet all the possible adverse reactions/side effects.   Do not drive, operating heavy machinery, perform activities at heights, swimming or participation in water activities or provide baby sitting services if your were admitted for syncope or siezures until you have seen by Primary MD or a Neurologist and advised to do so again.  Do not drive when taking Pain medications.    Do not take more than prescribed Pain, Sleep and Anxiety Medications  Special Instructions: If you have smoked or chewed Tobacco  in  the last 2 yrs please stop smoking, stop any regular Alcohol  and or any Recreational drug use.  Wear Seat belts while driving.   Please note  You were cared for by a hospitalist during your hospital stay. If you have any questions about your discharge medications or the care you received while you were in the hospital after you are discharged, you can call the unit and asked to speak with the hospitalist on call if the hospitalist that took care of you is not available. Once you are discharged, your primary care physician will handle any further medical issues. Please note that NO REFILLS for any discharge medications will be authorized once you are discharged, as it is imperative that you return to your primary care physician (or establish a relationship with a primary care physician if you do not have one) for your aftercare needs so that they can reassess your need for medications and monitor your lab values.

## 2015-10-01 NOTE — Discharge Summary (Addendum)
[name], is a 60 y.o. female  DOB 21-Jul-1955  MRN [id number].  Admission date:  09/23/2015  Admitting Physician  Mariel Aloe, MD  Discharge Date:  10/01/2015   Primary MD  [place]  Recommendations for primary care physician for things to follow:  - Please check CBC, BMP during next visit - Further recommendation regarding Decadron taper per radiation oncology.   Admission Diagnosis  chest pain   Discharge Diagnosis  chest pain    Principal Problem:   Nausea & vomiting Active Problems:   Essential hypertension   Endometrial cancer (HCC)   Anemia of chronic disease   Pericardial effusion   Chest pain   Pain in the chest   Emesis   Fever, unspecified   Nausea and vomiting   Endometrial cancer, FIGO stage IVB (HCC)      Past Medical History:  Diagnosis Date  . Chemotherapy-induced neuropathy ([place])   . DVT, lower extremity ([place])    left  02-12-2015  currently on Eliqius  . Endometrial cancer [facility]) oncologist-  dr Marko Plume   02-2015  . 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 Family history of breast cancer   . History of adenomatous polyp of colon    tubulovillious adenoma 09/ 2010  . History of ovarian cyst    complex  . History of radiation therapy 10/21/08-11/23/08 & 12/07/10/13/12/23 2010   ENDOMETRIOID   . History of uterine fibroid   . Hypertension   . Inguinal fluid collection 02/2015   right-drained x2  . Iron deficiency anemia    chronic severe  . Metastasis to lung ([place])   . 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 ABDOMINAL HYSTERECTOMY  2010  . APPLICATION OF WOUND VAC Right 05/12/2015   Procedure: APPLICATION OF WOUND VAC;  Surgeon: Hall Busing, MD;  Location: [facility];  Service: General;  Laterality: Right;  . COLONOSCOPY W/ POLYPECTOMY   09-27-2008  . EXPLORATORY LAPAROTOMY /  TOTAL ABDOMINAL HYSTERECTOMY/ BILATERAL SALPINOOPHORECTOMY/  PARTIAL OMENTECTOMY  03-18-2008  . INGUINAL HERNIA REPAIR Right 05/12/2015   Procedure: INCISION AND DRAINGE RIGHT INGUINAL FLUID COLLECTION ;  Surgeon: Hall Busing, MD;  Location: [facility];  Service: General;  Laterality: Right;  . IR GENERIC HISTORICAL  08/10/2015   IR US GUIDE VASC ACCESS RIGHT 08/10/2015 Jacqulynn Cadet, MD [facility]-INTERV RAD  . IR GENERIC HISTORICAL  08/10/2015   IR FLUORO GUIDE CV LINE RIGHT 08/10/2015 Jacqulynn Cadet, MD [facility]INTER[facility]  . porta cath  06/2010   removal  . TUBAL LIGATION  1990's  . UMBILICAL HERNIA REPAIR     infant       History of present illness and  Hospital Course:     Kindly see H&P for history of present illness and admission details, please review complete Labs, Consult reports and Test reports for all details in brief  HPI  from the  history and physical done on the day of admission 9/22/201  HPI: [name] is a 60 y.o. 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 She presented to the emergency department after suffering 1 day of significant worsening nausea and vomiting with associated chest pain that started this morning. Chest pain made worse with vomiting. No hematemesis. Pain does not radiate. She took an aspirin which did not help with her pain. She has been receiving radiation therapy for her metastatic disease and was told that the radiation could be causing the nausea and vomiting  ED Course: Vitals: Hypertensive. Otherwise normal vitals Labs: Hypokalemia with a potassium of 3.0. Leukocytosis with a white blood cell count of 14.5 K and a hemoglobin of 9.6 Imaging: Chest x-ray significant for left upper lobe mass and stable cardiomegaly. CT chest and abdomen significant for mild pericardial  effusion, decrease in some pulmonary metastatic lesions Medications/Course: Patient given a GI cocktail which did not help with pain. She is given fentanyl which helped with her pain mildly.  [place] a 60 y.o.femalewith medical history significant of endometrial cancer/ovarian cancer with metastasis to lymph nodes and lung, hypertension, history of DVT on anticoagulation. She presented to the emergency department after suffering 1 day of significant worsening nausea and vomiting with associated chest pain that started this morning. Chest pain made worse with vomiting. No hematemesis. Pain does not radiate. She took an aspirin which did not help with her pain. She has been receiving radiation therapy for her metastatic disease and was told that the radiation could be causing the nausea and vomiting, CT head obtained secondary to fall during hospital stay significant for metastasis. Significant improvement of nausea and vomiting after starting Decadron for brain metastasis,  Nausea & vomiting with fever - CT abd/ pelvis unrevealing, LFTs not suggestive of liver/ gallbladder issues, lipase normal - nausea and vomiting began when she began radiation to the chest , possibly related to radiation, as well patient with a new diagnosis of brain metastasis, she reports significant improvement of her symptoms after she was started on Decadron, so nausea and vomiting more likely related to new brain metastases. 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 Vertell Limber from neurosurgery as an outpatient . - Continue with Decadron for  edema and Keppra for seizure prophylaxis, discussed with radiation oncology , no contraindication for Eliquis, and further Decadron taper per radiation oncology as an outpatient.     Fever - initially suspected that vomiting and fever was possible related to a gastroenteritis   - temp 102 on 9/22 starting after being admitted to the hospital- it has not recurred - UA negative, blood cultures NGTD - no respiratory symptoms-CT chest, abd, pelvis unrevealing- no diarrhea  Chest pain  - likely due to vomiting and musculoskeletal- started Voltaren gel- weaned off Fentanyl - troponin negative- EKG unchanged from prior   H/o LLE DVT in 2/17 -Patient receiving Eliquis at home, was on Lovenox as wasn't  able to straight oral intake, resumed back on a Eliquis  as Tolerate oral.   Essential hypertension - Uncontrolled during hospital stay most likely related to change of regimen as she was not taking oral, as well due to discomfort from nausea, no chest uptake all her we'll discharge her on her home regimen.  Cancer related pain - Palliative medicine consult appreciated - Patient reports she has good control at home with MSIR 15 mg every 6 hours, will be discharged on this regimen, given prescription for 20 tablets  Anemia of chronic disease - baseline is between 8-10- last anemia panel in 2/17 consistent with AOCD - Glycohemoglobin is 7.8 today, will transfuse 1 unit PRBC 9/29, hemoglobin is 8.9 posttransfusion    Pericardial effusion - noted on CT chest in ER- this is mild and does not need further work up at this point     Discharge Condition:  stable Follow UP  [facility] Follow up in 1 week(s).   Contact information: [place] [address] [address] .   Why:  pick up tub bench  from store Contact information: [address] [place] [address] [phone]             Discharge Instructions  and  Discharge Medications     Discharge Instructions    Discharge instructions    Complete by:  As directed    Follow with Primary MD [place] in 7 days   Get CBC, CMP, checked  by Primary MD next visit.    Activity: As tolerated with Full fall precautions use walker/cane & assistance as needed   Disposition Home    Diet: regular diet , with feeding assistance and aspiration precautions.  For Heart failure patients - Check your Weight same time everyday, if you gain over 2 pounds, or you develop in leg swelling, experience more shortness of breath or chest pain, call your Primary MD immediately. Follow Cardiac Low Salt Diet and 1.5 lit/day fluid restriction.   On your next visit with your primary care physician please Get Medicines reviewed and adjusted.   Please request your Prim.MD to go over all Hospital Tests and Procedure/Radiological results at the follow up, please get all Hospital records sent to your Prim MD by signing hospital release before you go home.   If you experience worsening of your admission symptoms, develop shortness of breath, life threatening emergency, suicidal or homicidal thoughts you must seek medical attention immediately by calling 911 or calling your MD immediately  if symptoms less severe.  You Must read complete instructions/literature along with all the possible adverse reactions/side effects for all the Medicines you take and that have been prescribed to you. Take any new Medicines after you have completely understood and accpet all the possible adverse reactions/side effects.   Do not drive, operating heavy machinery, perform activities at heights, swimming or participation in water activities or provide baby sitting services if your were admitted for syncope or siezures until you have  seen by Primary MD or a Neurologist and advised to do so again.  Do not drive when taking Pain medications.    Do not take more than prescribed Pain, Sleep and Anxiety Medications  Special Instructions: If you have smoked or chewed Tobacco  in the last 2 yrs please stop smoking, stop any regular Alcohol  and or any Recreational drug use.  Wear Seat belts while driving.   Please note  You were cared for by a hospitalist during your hospital stay. If you have any questions about your discharge medications or the care you received while you were in the hospital after you are discharged, you can call the unit and asked to speak with the hospitalist on call if the hospitalist that took care of you is not available. Once you are discharged, your primary care physician will handle any further medical issues. Please note that NO REFILLS for any discharge medications will be authorized once you are discharged, as it is imperative that you return to your primary care physician (or establish a relationship with a primary care physician if you do not have one) for your aftercare needs so that they can reassess your need for medications and monitor your lab values.   Increase activity slowly    Complete by:  As directed        Medication List    STOP taking these medications   HYDROmorphone 2 MG tablet Commonly known as:  DILAUDID     TAKE these medications   apixaban 5 MG Tabs tablet Commonly known as:  ELIQUIS Take 1 tablet (5 mg total) by mouth 2 (two) times daily.   benzonatate 100 MG capsule Commonly known as:  TESSALON Take 1 capsule (100 mg total) by mouth 3 (three) times daily as needed for cough.   CATAPRES 0.1 MG tablet Generic drug:  cloNIDine Take 0.1 mg by mouth daily.   chlorpheniramine-HYDROcodone 10-8 MG/5ML Suer Commonly known as:  TUSSIONEX Take 5 ml by mouth every 8-12 hours as needed for cough.   dexamethasone 4 MG tablet Commonly known as:  DECADRON Take 1 tablet  (4 mg total) by mouth 3 (three) times daily. What changed:  how much to take  when to take this  additional instructions   feeding supplement (ENSURE ENLIVE) Liqd Take 237 mLs by mouth 2 (two) times daily between meals.   ferrous sulfate 325 (65 FE) MG tablet Take 1 tablet (325 mg total) by mouth 2 (two) times daily with a meal.   FLINTSTONES GUMMIES PO Take 1 tablet by mouth daily.   hydrochlorothiazide 25 MG tablet Commonly known as:  HYDRODIURIL Take 25 mg by mouth daily.   labetalol 100 MG tablet Commonly known as:  NORMODYNE Take 2 tablets (200 mg total) by mouth 3 (three) times daily.   letrozole 2.5 MG tablet Commonly known as:  FEMARA Take 1 tablet (2.5 mg total) by mouth daily.   levETIRAcetam 500 MG tablet Commonly known as:  KEPPRA Take 1 tablet (500 mg total) by mouth 2 (two) times daily.   lidocaine-prilocaine cream Commonly known as:  EMLA Apply to Porta-cath 1-2 hrs prior to access as directed. What changed:  how much to take  how to take this  when to take this  reasons to take this  additional instructions   LORazepam 0.5 MG tablet Commonly known as:  ATIVAN Place 1 tablet under the tongue or swallow every 6 hrs as needed for nausea.   morphine 15 MG tablet Commonly known as:  MSIR Take 1 tablet (15 mg total) by mouth every 6 (six) hours as needed for moderate pain or severe pain.   ondansetron 8 MG disintegrating tablet Commonly known as:  ZOFRAN ODT Take 1 tablet (8 mg total) by mouth every 8 (eight) hours as needed for nausea or vomiting.   pantoprazole 40  MG tablet Commonly known as:  PROTONIX Take 1 tablet (40 mg total) by mouth daily.   polyethylene glycol packet Commonly known as:  MIRALAX / GLYCOLAX Take 17 g by mouth 2 (two) times daily as needed for mild constipation. Reported on 05/23/2015   potassium chloride 10 MEQ tablet Commonly known as:  K-DUR Take 2 tablets today and then 1 tablet daily   promethazine 25 MG  tablet Commonly known as:  PHENERGAN Take 1 tablet (25 mg total) by mouth every 6 (six) hours as needed for nausea or vomiting.   senna-docusate 8.6-50 MG tablet Commonly known as:  Senokot-S Take 1 tablet by mouth 2 (two) times daily.   SONAFINE EX Apply 1 application topically as needed (For wound dressing.).   sucralfate 1 GM/10ML suspension Commonly known as:  CARAFATE Take 10 mLs (1 g total) by mouth 4 (four) times daily -  with meals and at bedtime.   zolpidem 5 MG tablet Commonly known as:  AMBIEN Take 1 tablet (5 mg total) by mouth at bedtime as needed for sleep.         Diet and Activity recommendation: See Discharge Instructions above   Consults obtained -   Radiation oncology  Oncology  Palliative   Major procedures and Radiology Reports - PLEASE review detailed and final reports for all details, in brief -      Dg Chest 2 View  Result Date: 09/23/2015 CLINICAL DATA:  Mid chest pain hand shortness of breath since 6 a.m. today. EXAM: CHEST  2 VIEW COMPARISON:  09/12/2015 FINDINGS: Left upper lobe mass again noted not significantly changed. Right Port-A-Cath is unchanged. Mild cardiomegaly. No confluent opacity on the right. No effusions or acute bony abnormality. IMPRESSION: Stable left upper lobe mass. Mild cardiomegaly. Electronically Signed   By: Rolm Baptise M.D.   On: 09/23/2015 10:56   Ct Head Wo Contrast  Result Date: 09/28/2015 CLINICAL DATA:  60 year old female status post fall in the middle of the night. On Coumadin. Personal history of endometrial carcinoma, and large left lung mass in July. Initial encounter. EXAM: CT HEAD WITHOUT CONTRAST TECHNIQUE: Contiguous axial images were obtained from the base of the skull through the vertex without intravenous contrast. COMPARISON:  PET-CT 08/01/2015. FINDINGS: Brain: Posterior fossa mass effect and abnormal hypodensity in the central and left cerebellar hemisphere encompassing an area of about 3.5 cm  (series 2, image 8). Mass effect on the fourth ventricle, but no ventriculomegaly. Crowding of the pre pontine cistern which remains patent. Edema mildly crosses midline to the central right cerebellum. Supratentorial gray-white matter differentiation appears normal. No supratentorial mass effect. Partially empty sella incidentally noted. No acute intracranial hemorrhage identified. No cortically based acute infarct identified. Vascular: Mild Calcified atherosclerosis at the skull base. Skull: left posterior skull exostosis incidentally noted. No acute or suspicious osseous lesion identified. Sinuses/Orbits: Clear. Other: Negative orbit and scalp soft tissues. IMPRESSION: 1. Cerebellar edema centrally on the left with suggestion of an underlying 3.5 cm left cerebellar mass. Mass effect on the fourth ventricle but no ventriculomegaly or loss of basilar cistern patency. 2. Cerebellar metastatic disease favored in this clinical setting. Follow-up Brain MRI without and with contrast would characterize further. 3. No other acute or metastatic intracranial abnormality identified. Electronically Signed   By: Genevie Ann M.D.   On: 09/28/2015 10:35   Ct Chest W Contrast  Result Date: 09/23/2015 CLINICAL DATA:  History of endometrial carcinoma with metastatic disease and chest pain EXAM: CT CHEST, ABDOMEN, AND  PELVIS WITH CONTRAST TECHNIQUE: Multidetector CT imaging of the chest, abdomen and pelvis was performed following the standard protocol during bolus administration of intravenous contrast. CONTRAST:  19mL ISOVUE-300 IOPAMIDOL (ISOVUE-300) INJECTION 61% COMPARISON:  06/14/2015, 08/01/2015 FINDINGS: CT CHEST FINDINGS Cardiovascular: Bovine arch anomaly is noted. The thoracic aorta show some calcific changes without evidence of dissection or aneurysmal dilatation. The pulmonary artery as visualized is within normal limits. Timing was not optimum for pulmonary artery evaluation. Mild pericardial effusion is noted new from  the prior exam. Mediastinum/Nodes: The thoracic inlet is within normal limits. There is again noted a 80 somewhat necrotic mass which abuts the mediastinum on the left. It measures approximately 7.2 x 6.6 cm in greatest transverse and AP dimensions respectively. This is a slight decrease when compared with the prior exam. Some mediastinal adenopathy is noted in the aorticopulmonary window measuring 3.0 cm in short axis. This also demonstrates central decreased attenuation consistent with necrosis. Lungs/Pleura: The lungs are well aerated bilaterally and again demonstrate a large left upper lobe mass lesion which abuts the major fissure. It measures approximately 5.0 by 4.0 cm in greatest dimension. This is also decreased in size from prior exam measuring 5.5 x 5.1 cm. Left lower lobe nodule with spiculation is noted and also reduced in size now measuring approximately 19 mm in greatest dimension decreased from the prior exam measuring 26 mm in greatest dimension. The right lung is well aerated and demonstrates some dependent atelectatic changes. A tiny nodule is noted in the lateral aspect of the right upper lobe also decreased in size from the prior PET-CT. A second right lower lobe nodule is seen measuring approximately 9 mm. It demonstrates some tenting of the adjacent major fissure. It is slightly decreased in size from the prior exam at which time it measured 11 mm. Small left pleural effusion is noted. Musculoskeletal: No acute bony abnormality is noted. CT ABDOMEN PELVIS FINDINGS Hepatobiliary: Liver demonstrates some hypodensities adjacent to the falciform ligament consistent with focal fatty infiltration. The gallbladder is well distended. Pancreas: Within normal limits. Spleen: Within normal limits. Adrenals/Urinary Tract: The adrenal glands are unremarkable. The kidneys demonstrate renal cystic change on the right as well as a nonobstructing renal stone stable from the prior exam. The bladder is well  distended without focal abnormality. Stomach/Bowel: Stomach is within normal limits. No evidence of bowel wall thickening, distention, or inflammatory changes. Appendix appears normal. Vascular/Lymphatic: Aortic atherosclerosis. The previously seen left pelvic lymphadenopathy in the iliac chain has increased in size from the prior exam measuring 3.7 x 2.6 cm. It previously measured 3 x 2 cm. Persistent soft tissue changes are noted in the right inguinal region but slightly decreased when compared with the prior exam. No central necrotic component is noted. Stable left psoas mass is noted. Stable left periaortic lymph node is noted as well. Reproductive: Status post hysterectomy. No adnexal masses. Musculoskeletal: No acute bony abnormality is noted. IMPRESSION: Decrease in some of the pulmonary metastatic lesions as described above. The mass lesion abutting the mediastinum on the left has decreased in size from the prior exam. New mild pericardial effusion. Increase in size in a pelvic lymphadenopathy in the iliac chain. The left psoas mass and periaortic lymph node as well as changes in the right inguinal region are relatively stable as described. No new focal abnormality is seen. Electronically Signed   By: [name] M.D.   On: 09/23/2015 11:56   Mr Jeri Cos [id number] Contrast  Result Date: 09/29/2015 CLINICAL DATA:  Nausea and vomiting, chest pain for 1 day. History of metastatic endometrial and ovarian cancer, on radiation therapy. Followup probable LEFT cerebellar mass. EXAM: MRI HEAD WITHOUT AND WITH CONTRAST TECHNIQUE: Multiplanar, multiecho pulse sequences of the brain and surrounding structures were obtained without and with intravenous contrast. CONTRAST:  23mL MULTIHANCE GADOBENATE DIMEGLUMINE 529 MG/ML IV SOLN COMPARISON:  CT HEAD September 28, 2015 at 1010 hours. FINDINGS: BRAIN: T2 bright heterogeneously enhancing 2 x 2.3 x 2 cm (transverse by AP by CC) LEFT cerebellar mass corresponding to C2  abnormality. Faint susceptibility artifact along the margins. Surrounding T2 bright vasogenic edema with mild mass effect on the fourth ventricle which remains patent. No additional intraparenchymal mass. Supratentorial ventricles are normal. A few scattered sub cm supratentorial white matter FLAIR T2 hyperintensities compatible with mild chronic small vessel ischemic disease, within normal range for patient's age. No abnormal extra-axial fluid collections, extra-axial enhancement or extra-axial masses. VASCULAR: Normal major intracranial vascular flow voids present at skull base. SKULL AND UPPER CERVICAL SPINE: No abnormal sellar expansion. No suspicious calvarial bone marrow signal. Craniocervical junction maintained. Torus palatini. LEFT parietal exostosis without enhancement. SINUSES/ORBITS: The mastoid air-cells and included paranasal sinuses are well-aerated. The included ocular globes and orbital contents are non-suspicious. OTHER: None. 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 Vasogenic edema with mild mass effect on the fourth ventricle, no obstructive hydrocephalus. Otherwise negative MRI of the head for age. Electronically Signed   By: [name] M.D.   On: 09/29/2015 03:52   Ct Abdomen Pelvis W Contrast  Result Date: 09/23/2015 CLINICAL DATA:  History of endometrial carcinoma with metastatic disease and chest pain EXAM: CT CHEST, ABDOMEN, AND PELVIS WITH CONTRAST TECHNIQUE: Multidetector CT imaging of the chest, abdomen and pelvis was performed following the standard protocol during bolus administration of intravenous contrast. CONTRAST:  151mL ISOVUE-300 IOPAMIDOL (ISOVUE-300) INJECTION 61% COMPARISON:  06/14/2015, 08/01/2015 FINDINGS: CT CHEST FINDINGS Cardiovascular: Bovine arch anomaly is noted. The thoracic aorta show some calcific changes without evidence of dissection or aneurysmal dilatation. The pulmonary artery as visualized is within normal limits. Timing was  not optimum for pulmonary artery evaluation. Mild pericardial effusion is noted new from the prior exam. Mediastinum/Nodes: The thoracic inlet is within normal limits. There is again noted a 80 somewhat necrotic mass which abuts the mediastinum on the left. It measures approximately 7.2 x 6.6 cm in greatest transverse and AP dimensions respectively. This is a slight decrease when compared with the prior exam. Some mediastinal adenopathy is noted in the aorticopulmonary window measuring 3.0 cm in short axis. This also demonstrates central decreased attenuation consistent with necrosis. Lungs/Pleura: The lungs are well aerated bilaterally and again demonstrate a large left upper lobe mass lesion which abuts the major fissure. It measures approximately 5.0 by 4.0 cm in greatest dimension. This is also decreased in size from prior exam measuring 5.5 x 5.1 cm. Left lower lobe nodule with spiculation is noted and also reduced in size now measuring approximately 19 mm in greatest dimension decreased from the prior exam measuring 26 mm in greatest dimension. The right lung is well aerated and demonstrates some dependent atelectatic changes. A tiny nodule is noted in the lateral aspect of the right upper lobe also decreased in size from the prior PET-CT. A second right lower lobe nodule is seen measuring approximately 9 mm. It demonstrates some tenting of the adjacent major fissure. It is slightly decreased in size from the prior exam at which time it measured  11 mm. Small left pleural effusion is noted. Musculoskeletal: No acute bony abnormality is noted. CT ABDOMEN PELVIS FINDINGS Hepatobiliary: Liver demonstrates some hypodensities adjacent to the falciform ligament consistent with focal fatty infiltration. The gallbladder is well distended. Pancreas: Within normal limits. Spleen: Within normal limits. Adrenals/Urinary Tract: The adrenal glands are unremarkable. The kidneys demonstrate renal cystic change on the right as  well as a nonobstructing renal stone stable from the prior exam. The bladder is well distended without focal abnormality. Stomach/Bowel: Stomach is within normal limits. No evidence of bowel wall thickening, distention, or inflammatory changes. Appendix appears normal. Vascular/Lymphatic: Aortic atherosclerosis. The previously seen left pelvic lymphadenopathy in the iliac chain has increased in size from the prior exam measuring 3.7 x 2.6 cm. It previously measured 3 x 2 cm. Persistent soft tissue changes are noted in the right inguinal region but slightly decreased when compared with the prior exam. No central necrotic component is noted. Stable left psoas mass is noted. Stable left periaortic lymph node is noted as well. Reproductive: Status post hysterectomy. No adnexal masses. Musculoskeletal: No acute bony abnormality is noted. IMPRESSION: Decrease in some of the pulmonary metastatic lesions as described above. The mass lesion abutting the mediastinum on the left has decreased in size from the prior exam. New mild pericardial effusion. Increase in size in a pelvic lymphadenopathy in the iliac chain. The left psoas mass and periaortic lymph node as well as changes in the right inguinal region are relatively stable as described. No new focal abnormality is seen. Electronically Signed   By: [name] M.D.   On: 09/23/2015 11:56   Dg Abdomen Acute W/chest  Result Date: 09/12/2015 CLINICAL DATA:  Endometrial cancer with lung metastases EXAM: DG ABDOMEN ACUTE W/ 1V CHEST COMPARISON:  PET-CT dated 08/01/2015 FINDINGS: Left upper lobe masses, better evaluated on recent PET-CT. Mild blunting of the left costophrenic angle. Right lung is clear. No pneumothorax. Cardiomegaly. Right chest power port terminates in the lower SVC. Nonobstructive bowel gas pattern. No evidence of free air on the lateral decubitus view. Visualized osseous structures are within normal limits. IMPRESSION: Left upper lobe masses, better  evaluated on recent PET-CT. No evidence of small bowel obstruction or free air. Electronically Signed   By: Julian Hy M.D.   On: 09/12/2015 17:01    Micro Results    Recent Results (from the past 240 hour(s))  Culture, blood (Routine X 2) w Reflex to ID Panel     Status: None   Collection Time: 09/24/15  3:20 PM  Result Value Ref Range Status   Specimen Description BLOOD RIGHT ARM  Final   Special Requests BOTTLES DRAWN AEROBIC AND ANAEROBIC 5CC EA  Final   Culture   Final    NO GROWTH 5 DAYS Performed at [facility]    Report Status 09/30/2015 FINAL  Final  Culture, blood (Routine X 2) w Reflex to ID Panel     Status: None   Collection Time: 09/24/15  3:20 PM  Result Value Ref Range Status   Specimen Description BLOOD RIGHT HAND  Final   Special Requests BOTTLES DRAWN AEROBIC AND ANAEROBIC 5CC EA  Final   Culture   Final    NO GROWTH 5 DAYS Performed at [facility]    Report Status 09/30/2015 FINAL  Final       Today   Subjective:   [name] today has no headache,no chest or  abdominal pain,feels much better wants to go home today.  Denies any further nausea or vomiting, significantly improved oral intake  Objective:   Blood pressure (!) 135/54, pulse 68, temperature 97.4 F (36.3 C), temperature source Oral, resp. rate 18, height 5\' 3"  (1.6 m), weight 74.1 kg (163 lb 5.8 oz), SpO2 97 %.   Intake/Output Summary (Last 24 hours) at 10/01/15 1319 Last data filed at 10/01/15 1100  Gross per 24 hour  Intake             2080 ml  Output                0 ml  Net             2080 ml    Exam Awake Alert, Oriented x 3,  Supple Neck,No JVD,   Symmetrical Chest wall movement, Good air movement bilaterally, CTAB RRR,No Gallops,Rubs or new Murmurs, No Parasternal Heave +ve B.Sounds, Abd Soft, Non tender, No rebound -guarding or rigidity. No Cyanosis, Clubbing or edema, No new Rash or bruise  Data Review   CBC w Diff:  Lab Results    Component Value Date   WBC 10.3 09/30/2015   HGB 8.9 (L) 09/30/2015   HGB 8.4 (L) 09/06/2015   HCT 27.3 (L) 09/30/2015   HCT 25.7 (L) 09/06/2015   PLT 409 (H) 09/30/2015   PLT 482 (H) 09/06/2015   LYMPHOPCT 4.4 (L) 09/06/2015   MONOPCT 12.5 09/06/2015   EOSPCT 2.2 09/06/2015   BASOPCT 0.7 09/06/2015    CMP:  Lab Results  Component Value Date   NA 137 09/30/2015   NA 137 09/06/2015   K 3.4 (L) 09/30/2015   K 3.6 09/06/2015   CL 109 09/30/2015   CO2 22 09/30/2015   CO2 23 09/06/2015   BUN 12 09/30/2015   BUN 6.2 (L) 09/06/2015   CREATININE 0.95 09/30/2015   CREATININE 0.7 09/06/2015   PROT 7.8 09/23/2015   PROT 7.3 09/06/2015   ALBUMIN 3.5 09/23/2015   ALBUMIN 2.6 (L) 09/06/2015   BILITOT 0.6 09/23/2015   BILITOT 0.55 09/06/2015   ALKPHOS 81 09/23/2015   ALKPHOS 92 09/06/2015   AST 11 (L) 09/23/2015   AST 8 09/06/2015   ALT 8 (L) 09/23/2015   ALT <9 09/06/2015  .   Total Time in preparing paper work, data evaluation and todays exam - 35 minutes  ELGERGAWY, DAWOOD M.D on 10/01/2015 at 1:19 PM  [facility]   Office  [phone]

## 2015-10-02 ENCOUNTER — Other Ambulatory Visit: Payer: Self-pay | Admitting: Oncology

## 2015-10-02 LAB — TYPE AND SCREEN
ABO/RH(D): O POS
Antibody Screen: NEGATIVE
Unit division: 0

## 2015-10-03 ENCOUNTER — Other Ambulatory Visit: Payer: Medicaid Other

## 2015-10-03 ENCOUNTER — Ambulatory Visit: Payer: Medicaid Other | Admitting: Oncology

## 2015-10-03 LAB — GLUCOSE, CAPILLARY: GLUCOSE-CAPILLARY: 110 mg/dL — AB (ref 65–99)

## 2015-10-04 NOTE — Progress Notes (Signed)
  Radiation Oncology         [phone] ________________________________  Name: [name] MRN: [id number]  Date: 09/15/2015  DOB: [birth year]  End of Treatment Note  Diagnosis:   Metastatic ovarian cancer     Indication for treatment:  Palliative       Radiation treatment dates:  08/24/2015-09/15/2015  Site/dose:   Left Lung: 37.5 Gy in 15 fractions  Beams/energy:   3D // [employer]  Narrative: The patient tolerated radiation treatment relatively well. The patient complained of back pain and took morphine 15 mg for this. On 09/12/15, the patient did not have radiation due to not feeling well from nausea and emesis and checked herself into the ED. The patient was discharged with Zofran, Phenergan, and IV fluids. She reports having occasional shortness of breath with exertion, a poor appetite, and having fatigue all day. Tumor shrinkage was noted on cone beam CT imaging during the last week of her treatment.  Plan: The patient has completed radiation treatment. The patient will return to radiation oncology clinic for routine followup in one month. I advised them to call or return sooner if they have any questions or concerns related to their recovery or treatment.  -----------------------------------  Blair Promise, PhD, MD  This document serves as a record of services personally performed by Gery Pray, MD. It was created on his behalf by Darcus Austin, a trained medical scribe. The creation of this record is based on the scribe's personal observations and the provider's statements to them. This document has been checked and approved by the attending provider.

## 2015-10-06 ENCOUNTER — Telehealth: Payer: Self-pay | Admitting: Radiation Therapy

## 2015-10-06 ENCOUNTER — Other Ambulatory Visit: Payer: Self-pay | Admitting: Radiation Oncology

## 2015-10-06 DIAGNOSIS — Z510 Encounter for antineoplastic radiation therapy: Secondary | ICD-10-CM | POA: Diagnosis not present

## 2015-10-06 DIAGNOSIS — C7931 Secondary malignant neoplasm of brain: Secondary | ICD-10-CM | POA: Diagnosis present

## 2015-10-06 DIAGNOSIS — C541 Malignant neoplasm of endometrium: Secondary | ICD-10-CM

## 2015-10-06 DIAGNOSIS — C801 Malignant (primary) neoplasm, unspecified: Secondary | ICD-10-CM | POA: Diagnosis not present

## 2015-10-06 MED ORDER — LORAZEPAM 0.5 MG PO TABS: ORAL_TABLET | ORAL | 0 refills | Status: DC

## 2015-10-06 NOTE — Telephone Encounter (Signed)
done

## 2015-10-06 NOTE — Telephone Encounter (Signed)
[name] called to say that she is concerned about wearing the mask during her [facility] treatment tomorrow. I let her know that we can offer Ativan to help relax her during the procedure. She felt encouraged by this and is very interested in this. I will let Shona Simpson know.   Mont Dutton R.T.(R)(T) Special Procedures Navigator

## 2015-10-07 ENCOUNTER — Ambulatory Visit (HOSPITAL_BASED_OUTPATIENT_CLINIC_OR_DEPARTMENT_OTHER): Payer: Medicaid Other | Admitting: Nurse Practitioner

## 2015-10-07 ENCOUNTER — Emergency Department (HOSPITAL_COMMUNITY): Payer: Medicaid Other

## 2015-10-07 ENCOUNTER — Other Ambulatory Visit: Payer: Self-pay | Admitting: Nurse Practitioner

## 2015-10-07 ENCOUNTER — Ambulatory Visit
Admission: RE | Admit: 2015-10-07 | Discharge: 2015-10-07 | Disposition: A | Discharge status: Home / Self Care | Payer: Medicaid Other | Source: Ambulatory Visit | Attending: Radiation Oncology | Admitting: Radiation Oncology

## 2015-10-07 ENCOUNTER — Encounter: Payer: Self-pay | Admitting: Radiation Oncology

## 2015-10-07 ENCOUNTER — Other Ambulatory Visit: Payer: Self-pay

## 2015-10-07 ENCOUNTER — Encounter: Payer: Self-pay | Admitting: Nurse Practitioner

## 2015-10-07 ENCOUNTER — Telehealth: Payer: Self-pay | Admitting: Radiation Oncology

## 2015-10-07 ENCOUNTER — Inpatient Hospital Stay (HOSPITAL_COMMUNITY)
Admission: EM | Admit: 2015-10-07 | Discharge: 2015-10-09 | DRG: 305 | Disposition: A | Discharge status: Home / Self Care | Payer: Medicaid Other | Attending: Internal Medicine | Admitting: Internal Medicine

## 2015-10-07 ENCOUNTER — Ambulatory Visit (HOSPITAL_BASED_OUTPATIENT_CLINIC_OR_DEPARTMENT_OTHER)
Admission: RE | Admit: 2015-10-07 | Discharge: 2015-10-07 | Disposition: A | Discharge status: Home / Self Care | Payer: Medicaid Other | Source: Ambulatory Visit | Attending: Radiation Oncology | Admitting: Radiation Oncology

## 2015-10-07 ENCOUNTER — Encounter (HOSPITAL_COMMUNITY): Payer: Self-pay | Admitting: *Deleted

## 2015-10-07 ENCOUNTER — Telehealth: Payer: Self-pay | Admitting: Oncology

## 2015-10-07 VITALS — BP 214/71 | HR 49 | Temp 98.0°F

## 2015-10-07 VITALS — BP 191/69 | HR 40 | Temp 97.5°F | Resp 17

## 2015-10-07 DIAGNOSIS — Z8249 Family history of ischemic heart disease and other diseases of the circulatory system: Secondary | ICD-10-CM

## 2015-10-07 DIAGNOSIS — T451X5A Adverse effect of antineoplastic and immunosuppressive drugs, initial encounter: Secondary | ICD-10-CM | POA: Diagnosis present

## 2015-10-07 DIAGNOSIS — I10 Essential (primary) hypertension: Secondary | ICD-10-CM | POA: Diagnosis present

## 2015-10-07 DIAGNOSIS — R030 Elevated blood-pressure reading, without diagnosis of hypertension: Secondary | ICD-10-CM | POA: Insufficient documentation

## 2015-10-07 DIAGNOSIS — D509 Iron deficiency anemia, unspecified: Secondary | ICD-10-CM | POA: Diagnosis present

## 2015-10-07 DIAGNOSIS — Z7901 Long term (current) use of anticoagulants: Secondary | ICD-10-CM

## 2015-10-07 DIAGNOSIS — G620 Drug-induced polyneuropathy: Secondary | ICD-10-CM | POA: Diagnosis present

## 2015-10-07 DIAGNOSIS — C541 Malignant neoplasm of endometrium: Secondary | ICD-10-CM

## 2015-10-07 DIAGNOSIS — Z806 Family history of leukemia: Secondary | ICD-10-CM

## 2015-10-07 DIAGNOSIS — K760 Fatty (change of) liver, not elsewhere classified: Secondary | ICD-10-CM | POA: Diagnosis present

## 2015-10-07 DIAGNOSIS — Z8543 Personal history of malignant neoplasm of ovary: Secondary | ICD-10-CM

## 2015-10-07 DIAGNOSIS — C55 Malignant neoplasm of uterus, part unspecified: Secondary | ICD-10-CM | POA: Diagnosis present

## 2015-10-07 DIAGNOSIS — Z86010 Personal history of colonic polyps: Secondary | ICD-10-CM

## 2015-10-07 DIAGNOSIS — I160 Hypertensive urgency: Secondary | ICD-10-CM | POA: Diagnosis not present

## 2015-10-07 DIAGNOSIS — Z79899 Other long term (current) drug therapy: Secondary | ICD-10-CM

## 2015-10-07 DIAGNOSIS — Z66 Do not resuscitate: Secondary | ICD-10-CM | POA: Diagnosis present

## 2015-10-07 DIAGNOSIS — C7931 Secondary malignant neoplasm of brain: Secondary | ICD-10-CM | POA: Diagnosis not present

## 2015-10-07 DIAGNOSIS — R1084 Generalized abdominal pain: Secondary | ICD-10-CM

## 2015-10-07 DIAGNOSIS — Z800 Family history of malignant neoplasm of digestive organs: Secondary | ICD-10-CM

## 2015-10-07 DIAGNOSIS — E860 Dehydration: Secondary | ICD-10-CM | POA: Diagnosis not present

## 2015-10-07 DIAGNOSIS — R109 Unspecified abdominal pain: Secondary | ICD-10-CM | POA: Diagnosis not present

## 2015-10-07 DIAGNOSIS — C7800 Secondary malignant neoplasm of unspecified lung: Secondary | ICD-10-CM | POA: Diagnosis present

## 2015-10-07 DIAGNOSIS — I82502 Chronic embolism and thrombosis of unspecified deep veins of left lower extremity: Secondary | ICD-10-CM | POA: Diagnosis present

## 2015-10-07 DIAGNOSIS — Z808 Family history of malignant neoplasm of other organs or systems: Secondary | ICD-10-CM

## 2015-10-07 DIAGNOSIS — Z79891 Long term (current) use of opiate analgesic: Secondary | ICD-10-CM

## 2015-10-07 DIAGNOSIS — E871 Hypo-osmolality and hyponatremia: Secondary | ICD-10-CM | POA: Diagnosis present

## 2015-10-07 DIAGNOSIS — C539 Malignant neoplasm of cervix uteri, unspecified: Secondary | ICD-10-CM

## 2015-10-07 DIAGNOSIS — C779 Secondary and unspecified malignant neoplasm of lymph node, unspecified: Secondary | ICD-10-CM | POA: Diagnosis present

## 2015-10-07 DIAGNOSIS — Z8542 Personal history of malignant neoplasm of other parts of uterus: Secondary | ICD-10-CM

## 2015-10-07 DIAGNOSIS — Z885 Allergy status to narcotic agent status: Secondary | ICD-10-CM

## 2015-10-07 DIAGNOSIS — Z510 Encounter for antineoplastic radiation therapy: Secondary | ICD-10-CM | POA: Diagnosis not present

## 2015-10-07 DIAGNOSIS — R001 Bradycardia, unspecified: Secondary | ICD-10-CM | POA: Diagnosis present

## 2015-10-07 DIAGNOSIS — Z803 Family history of malignant neoplasm of breast: Secondary | ICD-10-CM

## 2015-10-07 LAB — COMPREHENSIVE METABOLIC PANEL
ALT: 16 U/L (ref 0–55)
ANION GAP: 10 meq/L (ref 3–11)
AST: 12 U/L (ref 5–34)
Albumin: 2.5 g/dL — ABNORMAL LOW (ref 3.5–5.0)
Alkaline Phosphatase: 101 U/L (ref 40–150)
BILIRUBIN TOTAL: 0.87 mg/dL (ref 0.20–1.20)
BUN: 25.7 mg/dL (ref 7.0–26.0)
CO2: 26 meq/L (ref 22–29)
Calcium: 8.8 mg/dL (ref 8.4–10.4)
Chloride: 95 mEq/L — ABNORMAL LOW (ref 98–109)
Creatinine: 0.7 mg/dL (ref 0.6–1.1)
Glucose: 137 mg/dl (ref 70–140)
POTASSIUM: 4 meq/L (ref 3.5–5.1)
Sodium: 130 mEq/L — ABNORMAL LOW (ref 136–145)
TOTAL PROTEIN: 6.2 g/dL — AB (ref 6.4–8.3)

## 2015-10-07 LAB — CBC WITH DIFFERENTIAL/PLATELET
BASO%: 0.1 % (ref 0.0–2.0)
BASOS ABS: 0 10*3/uL (ref 0.0–0.1)
EOS ABS: 0 10*3/uL (ref 0.0–0.5)
EOS%: 0 % (ref 0.0–7.0)
HCT: 33.8 % — ABNORMAL LOW (ref 34.8–46.6)
HGB: 11.4 g/dL — ABNORMAL LOW (ref 11.6–15.9)
LYMPH%: 3.2 % — AB (ref 14.0–49.7)
MCH: 25.3 pg (ref 25.1–34.0)
MCHC: 33.7 g/dL (ref 31.5–36.0)
MCV: 74.9 fL — AB (ref 79.5–101.0)
MONO#: 0.5 10*3/uL (ref 0.1–0.9)
MONO%: 3.4 % (ref 0.0–14.0)
NEUT#: 13.6 10*3/uL — ABNORMAL HIGH (ref 1.5–6.5)
NEUT%: 93.3 % — AB (ref 38.4–76.8)
NRBC: 0 % (ref 0–0)
PLATELETS: 483 10*3/uL — AB (ref 145–400)
RBC: 4.51 10*6/uL (ref 3.70–5.45)
RDW: 21.7 % — ABNORMAL HIGH (ref 11.2–14.5)
WBC: 14.6 10*3/uL — ABNORMAL HIGH (ref 3.9–10.3)
lymph#: 0.5 10*3/uL — ABNORMAL LOW (ref 0.9–3.3)

## 2015-10-07 MED ORDER — HYDROMORPHONE HCL 2 MG PO TABS
2.0000 mg | ORAL_TABLET | ORAL | Status: DC | PRN
  Administered 2015-10-08: 2 mg via ORAL
  Filled 2015-10-07: qty 1

## 2015-10-07 MED ORDER — HYDRALAZINE HCL 20 MG/ML IJ SOLN: 10.0000 mg | INTRAMUSCULAR | Status: DC | PRN

## 2015-10-07 MED ORDER — DEXAMETHASONE 4 MG PO TABS
4.0000 mg | ORAL_TABLET | Freq: Three times a day (TID) | ORAL | Status: DC
  Administered 2015-10-07 – 2015-10-09 (×6): 4 mg via ORAL
  Filled 2015-10-07 (×6): qty 1

## 2015-10-07 MED ORDER — SODIUM CHLORIDE 0.9% FLUSH
10.0000 mL | Freq: Once | INTRAVENOUS | Status: AC
  Administered 2015-10-07: 10 mL via INTRAVENOUS

## 2015-10-07 MED ORDER — LEVETIRACETAM 500 MG PO TABS
500.0000 mg | ORAL_TABLET | Freq: Two times a day (BID) | ORAL | Status: DC
  Administered 2015-10-07 – 2015-10-09 (×4): 500 mg via ORAL
  Filled 2015-10-07 (×4): qty 1

## 2015-10-07 MED ORDER — LIDOCAINE-PRILOCAINE 2.5-2.5 % EX CREA: 1.0000 "application " | TOPICAL_CREAM | CUTANEOUS | Status: DC | PRN

## 2015-10-07 MED ORDER — HYDROCHLOROTHIAZIDE 25 MG PO TABS
25.0000 mg | ORAL_TABLET | Freq: Every day | ORAL | Status: DC
  Administered 2015-10-08 – 2015-10-09 (×2): 25 mg via ORAL
  Filled 2015-10-07 (×2): qty 1

## 2015-10-07 MED ORDER — APIXABAN 5 MG PO TABS
5.0000 mg | ORAL_TABLET | Freq: Two times a day (BID) | ORAL | Status: DC
  Administered 2015-10-07 – 2015-10-09 (×4): 5 mg via ORAL
  Filled 2015-10-07 (×4): qty 1

## 2015-10-07 MED ORDER — POTASSIUM CHLORIDE CRYS ER 10 MEQ PO TBCR
20.0000 meq | EXTENDED_RELEASE_TABLET | Freq: Every day | ORAL | Status: DC
  Administered 2015-10-07 – 2015-10-09 (×3): 20 meq via ORAL
  Filled 2015-10-07 (×3): qty 2

## 2015-10-07 MED ORDER — LETROZOLE 2.5 MG PO TABS
2.5000 mg | ORAL_TABLET | Freq: Every day | ORAL | Status: DC
Start: 2015-10-08 — End: 2015-10-09
  Administered 2015-10-08 – 2015-10-09 (×2): 2.5 mg via ORAL
  Filled 2015-10-07 (×3): qty 1

## 2015-10-07 MED ORDER — CLONIDINE HCL 0.1 MG PO TABS
0.1000 mg | ORAL_TABLET | Freq: Once | ORAL | Status: AC
  Administered 2015-10-07: 0.1 mg via ORAL
  Filled 2015-10-07: qty 1

## 2015-10-07 MED ORDER — POLYETHYLENE GLYCOL 3350 17 G PO PACK: 17.0000 g | PACK | Freq: Two times a day (BID) | ORAL | Status: DC | PRN

## 2015-10-07 MED ORDER — HYDROCHLOROTHIAZIDE 12.5 MG PO CAPS
25.0000 mg | ORAL_CAPSULE | Freq: Once | ORAL | Status: AC
  Administered 2015-10-07: 25 mg via ORAL
  Filled 2015-10-07: qty 2

## 2015-10-07 MED ORDER — ZOLPIDEM TARTRATE 5 MG PO TABS
5.0000 mg | ORAL_TABLET | Freq: Every evening | ORAL | Status: DC | PRN
  Administered 2015-10-08: 5 mg via ORAL
  Filled 2015-10-07: qty 1

## 2015-10-07 MED ORDER — MORPHINE SULFATE 15 MG PO TABS: 15.0000 mg | ORAL_TABLET | Freq: Four times a day (QID) | ORAL | Status: DC | PRN

## 2015-10-07 MED ORDER — PANTOPRAZOLE SODIUM 40 MG PO TBEC
40.0000 mg | DELAYED_RELEASE_TABLET | Freq: Every day | ORAL | Status: DC
  Administered 2015-10-08 – 2015-10-09 (×2): 40 mg via ORAL
  Filled 2015-10-07 (×2): qty 1

## 2015-10-07 MED ORDER — HYDRALAZINE HCL 20 MG/ML IJ SOLN: 10.0000 mg | Freq: Once | INTRAMUSCULAR | Status: DC

## 2015-10-07 MED ORDER — LORAZEPAM 0.5 MG PO TABS
0.5000 mg | ORAL_TABLET | Freq: Once | ORAL | Status: DC
Start: 2015-10-07 — End: 2015-10-07
  Filled 2015-10-07: qty 1

## 2015-10-07 MED ORDER — SODIUM CHLORIDE 0.9 % IV BOLUS (SEPSIS)
1000.0000 mL | Freq: Once | INTRAVENOUS | Status: AC
  Administered 2015-10-07: 1000 mL via INTRAVENOUS

## 2015-10-07 MED ORDER — CLONIDINE HCL 0.1 MG PO TABS
0.1000 mg | ORAL_TABLET | Freq: Every day | ORAL | Status: DC
  Administered 2015-10-07 – 2015-10-08 (×2): 0.1 mg via ORAL
  Filled 2015-10-07 (×2): qty 1

## 2015-10-07 MED ORDER — ENSURE ENLIVE PO LIQD
237.0000 mL | Freq: Two times a day (BID) | ORAL | Status: DC
  Administered 2015-10-08: 237 mL via ORAL

## 2015-10-07 MED ORDER — SENNOSIDES-DOCUSATE SODIUM 8.6-50 MG PO TABS
1.0000 | ORAL_TABLET | Freq: Two times a day (BID) | ORAL | Status: DC
  Administered 2015-10-07 – 2015-10-08 (×2): 1 via ORAL
  Filled 2015-10-07 (×2): qty 1

## 2015-10-07 NOTE — Progress Notes (Signed)
Shona Simpson, PA notified of patient's vital signs and pain/constipation concerns.

## 2015-10-07 NOTE — Assessment & Plan Note (Signed)
Patient states that she was in the emergency department last weekend with dehydration and received IV fluid rehydration.  She denies any nausea, vomiting, or diarrhea.  Check.  He feels constipated today.  She also feels increasingly fatigued and mildly dehydrated.  Sodium was down to 130.  Patient will be transported to the emergency department for further evaluation and management today.

## 2015-10-07 NOTE — ED Triage Notes (Signed)
Pt is presented for oncology/cancer center for evaluation for symptomatic bradycardia with HR to the 40s.

## 2015-10-07 NOTE — Telephone Encounter (Signed)
Beth, RN from Symptom Management called and asked for report on Ms. Hejl.  Advised her of patient's status including vital signs.  Beth said that Symptom Management will remain open until 5 pm.  After that the patient will need to to the ER.  Shona Simpson, PA notified.

## 2015-10-07 NOTE — ED Notes (Signed)
Currently awaiting consult from neurosurgery to [place].

## 2015-10-07 NOTE — Progress Notes (Signed)
  Radiation Oncology         [phone] ________________________________  Stereotactic Treatment Procedure Note  Name: [name] MRN: [id number]  Date: 10/07/2015  DOB: 1955-03-12  SPECIAL TREATMENT PROCEDURE    ICD-9-CM ICD-10-CM   1. Brain metastases (HCC) 198.3 C79.31 sodium chloride flush (NS) 0.9 % injection 10 mL     DISCONTINUED: LORazepam (ATIVAN) tablet 0.5 mg    3D TREATMENT PLANNING AND DOSIMETRY:  The patient's radiation plan was reviewed and approved by neurosurgery and radiation oncology prior to treatment.  It showed 3-dimensional radiation distributions overlaid onto the planning CT/MRI image set.  [facility] for the target structures as well as the organs at risk were reviewed. The documentation of the 3D plan and dosimetry are filed in the radiation oncology EMR.  NARRATIVE:  [name] was brought to the TrueBeam stereotactic radiation treatment machine and placed supine on the CT couch. The head frame was applied, and the patient was set up for stereotactic radiosurgery.  Neurosurgery was present for the set-up and delivery  SIMULATION VERIFICATION:  In the couch zero-angle position, the patient underwent Exactrac imaging using the Brainlab system with orthogonal KV images.  These were carefully aligned and repeated to confirm treatment position for each of the isocenters.  The Exactrac snap film verification was repeated at each couch angle.  PROCEDURE: [name] received stereotactic radiosurgery to the following targets: Left cerebellar 2.3 cm target was treated using 2 Dynamic Conformal Arcs to a prescription dose of 18 Gy.  ExacTrac registration was performed for each couch angle.  The 100% isodose line was prescribed.  6 MV X-rays were delivered in the flattening filter free beam mode.  STEREOTACTIC TREATMENT MANAGEMENT:  Following delivery, the patient was transported to nursing in stable condition and monitored for possible acute effects.   Vital signs were recorded BP (!) 214/71   Pulse (!) 49   Temp 98 F (36.7 C) (Oral)   SpO2 99% . The patient tolerated treatment without significant acute effects, and was discharged to home in stable condition.    PLAN: Follow-up in one month.  ________________________________  Sheral Apley. Tammi Klippel, M.D.  This document serves as a record of services personally performed by Tyler Pita, MD. It was created on his behalf by Darcus Austin, a trained medical scribe. The creation of this record is based on the scribe's personal observations and the provider's statements to them. This document has been checked and approved by the attending provider.

## 2015-10-07 NOTE — Assessment & Plan Note (Signed)
Patient has a history of chronic hypertension.  She states that she has multiple blood pressure medications; but did not take all of them earlier this morning.  She was in radiation oncology this afternoon; and was noted to have a systolic blood pressure as high as 217.  However, patient's heart rate on initial check was 46.  Her baseline heart rate is in the 80s.  Patient denied any chest pain, chest pressure, or shortness of breath.  She had no symptoms whatsoever of her hypertension earlier this afternoon.  Once patient received her brain irradiation this afternoon; patient was discharged and instructed to present to the symptom management clinic for further evaluation and management.  On initial check-patient's systolic blood pressure was 191, heart rate was down to 40.  Also, patient was complaining of some generalized abdominal discomfort.  The colon is stated that she feels constipated.  She denies any recent nausea or vomiting.  She states that she is hungry at this present time.  See further notes for details of today's visit.  Due to patient's significantly elevated blood pressure; and low heart rate-patient will be transported to the emergency department this evening for further evaluation and management.  Brief history.  Report were called to the emergency department charge nurse; prior to the patient being transported via wheelchair to the emergency department per the [place].    CODE STATUS: Patient has no advance directives in her chart; but does have a DO NOT RESUSCITATE documented in her problem list of her chart.

## 2015-10-07 NOTE — H&P (Signed)
History and Physical    [name] HBZ:[id number] DOB: Mar 30, 1955 DOA: 10/07/2015   PCP: [place] Chief Complaint:  Chief Complaint  Patient presents with  . Hypertension    HPI: [name] is a 60 y.o. female with medical history significant of endometrial cancer with brain met, HTN.  Patient presents to the ED with HTN and bradycardia.  Patient went to oncology appointment today and was noted to be hypertensive and bradycardic in the clinic, she was sent to the ED for evaluation.  Because she wasn't feeling well this morning she didn't take any of her BP meds today she reports.  ED Course: Initial HR in the 40s, sinus, BP 230s, improves to 190s when given BP meds.  HR now up to upper 50s.  Review of Systems: As per HPI otherwise 10 point review of systems negative.    Past Medical History:  Diagnosis Date  . Chemotherapy-induced neuropathy ([place])   . DVT, lower extremity ([place])    left  02-12-2015  currently on Eliqius  . Endometrial cancer [facility]) oncologist-  dr Marko Plume   02-2015  . 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 Family history of breast cancer   . History of adenomatous polyp of colon    tubulovillious adenoma 09/ 2010  . History of ovarian cyst    complex  . History of radiation therapy 10/21/08-11/23/08 & 12/07/10/13/12/23 2010   ENDOMETRIOID   . History of uterine fibroid   . Hypertension   . Inguinal fluid collection 02/2015   right-drained x2  . Iron deficiency anemia    chronic severe  . Metastasis to lung ([place])   . Metastasis to lymph nodes [facility])     Past Surgical History:  Procedure Laterality Date  . ABDOMINAL HYSTERECTOMY  2010  . APPLICATION OF WOUND VAC Right 05/12/2015   Procedure: APPLICATION OF WOUND VAC;  Surgeon: Hall Busing, MD;  Location: [facility];  Service: General;  Laterality: Right;  . COLONOSCOPY W/ POLYPECTOMY  09-27-2008  . EXPLORATORY LAPAROTOMY /  TOTAL  ABDOMINAL HYSTERECTOMY/ BILATERAL SALPINOOPHORECTOMY/  PARTIAL OMENTECTOMY  03-18-2008  . INGUINAL HERNIA REPAIR Right 05/12/2015   Procedure: INCISION AND DRAINGE RIGHT INGUINAL FLUID COLLECTION ;  Surgeon: Hall Busing, MD;  Location: [facility];  Service: General;  Laterality: Right;  . IR GENERIC HISTORICAL  08/10/2015   IR US GUIDE VASC ACCESS RIGHT 08/10/2015 Jacqulynn Cadet, MD [facility]INTER[facility]  . IR GENERIC HISTORICAL  08/10/2015   IR FLUORO GUIDE CV LINE RIGHT 08/10/2015 Jacqulynn Cadet, MD [facility]  . porta cath  06/2010   removal  . TUBAL LIGATION  1990's  . UMBILICAL HERNIA REPAIR     infant     reports that she has never smoked. She has never used smokeless tobacco. She reports that she does not drink alcohol or use drugs.  Allergies  Allergen Reactions  . Morphine And Related Itching and Other (See Comments)    Pruritis with morphine - depends on strength of medication  . Dilaudid [Hydromorphone Hcl] Itching and Nausea And Vomiting    Family History  Problem Relation Age of Onset  . Bone cancer Paternal Grandmother     OSSEOUS METASTASIS  . Heart disease Father   . Heart disease Sister   . Bone cancer Maternal Grandmother 75  . Leukemia Maternal Grandfather 45  . Liver cancer Maternal Uncle   . Breast cancer Cousin 39  maternal first cousin      Prior to Admission medications   Medication Sig Start Date End Date Taking? Authorizing Provider  apixaban (ELIQUIS) 5 MG TABS tablet Take 1 tablet (5 mg total) by mouth 2 (two) times daily. 03/03/15  Yes Lennis Marion Downer, MD  cloNIDine (CATAPRES) 0.1 MG tablet Take 0.1 mg by mouth at bedtime.  02/25/15  Yes Historical Provider, MD  dexamethasone (DECADRON) 4 MG tablet Take 1 tablet (4 mg total) by mouth 3 (three) times daily. 10/01/15  Yes Silver Huguenin Elgergawy, MD  hydrochlorothiazide (HYDRODIURIL) 25 MG tablet Take 25 mg by mouth daily.   11/01/08  Yes Historical Provider, MD  HYDROmorphone (DILAUDID) 2 MG  tablet TAKE 1 TO 2 TABLETS BY MOUTH EVERY 4 HOURS AS NEEDED FOR SEVERE PAIN 08/18/15  Yes Historical Provider, MD  labetalol (NORMODYNE) 100 MG tablet Take 2 tablets (200 mg total) by mouth 3 (three) times daily. 08/25/15  Yes Lennis Marion Downer, MD  letrozole (FEMARA) 2.5 MG tablet Take 1 tablet (2.5 mg total) by mouth daily. 08/18/15  Yes Lennis Marion Downer, MD  levETIRAcetam (KEPPRA) 500 MG tablet Take 1 tablet (500 mg total) by mouth 2 (two) times daily. 10/01/15  Yes Albertine Patricia, MD  LORazepam (ATIVAN) 0.5 MG tablet Take one tab po 30 minutes prior to radiation or MRI. 10/06/15  Yes Hayden Pedro, PA-C  pantoprazole (PROTONIX) 40 MG tablet Take 1 tablet (40 mg total) by mouth daily. 03/14/15  Yes Lennis Marion Downer, MD  Pediatric Multivit-Minerals-C (FLINTSTONES GUMMIES PO) Take 1 tablet by mouth daily.   Yes Historical Provider, MD  polyethylene glycol (MIRALAX / GLYCOLAX) packet Take 17 g by mouth 2 (two) times daily as needed for mild constipation. Reported on 05/23/2015   Yes Historical Provider, MD  potassium chloride (K-DUR) 10 MEQ tablet Take 2 tablets today and then 1 tablet daily Patient taking differently: Take 20 mEq by mouth daily.  05/23/15  Yes Lennis P Livesay, MD  senna-docusate (SENOKOT-S) 8.6-50 MG tablet Take 1 tablet by mouth 2 (two) times daily. 10/01/15  Yes Albertine Patricia, MD  zolpidem (AMBIEN) 5 MG tablet Take 1 tablet (5 mg total) by mouth at bedtime as needed for sleep. 09/02/15  Yes Lennis Marion Downer, MD  chlorpheniramine-HYDROcodone (TUSSIONEX) 10-8 MG/5ML SUER Take 5 ml by mouth every 8-12 hours as needed for cough. Patient not taking: Reported on 10/07/2015 08/15/15   Gery Pray, MD  feeding supplement, ENSURE ENLIVE, (ENSURE ENLIVE) LIQD Take 237 mLs by mouth 2 (two) times daily between meals. 10/01/15   Silver Huguenin Elgergawy, MD  lidocaine-prilocaine (EMLA) cream Apply to Porta-cath 1-2 hrs prior to access as directed. Patient taking differently: Apply 1 application  topically as needed (Apply to port-a-cath 1-2 hours prior to access.).  08/04/15   Lennis Marion Downer, MD  morphine (MSIR) 15 MG tablet Take 1 tablet (15 mg total) by mouth every 6 (six) hours as needed for moderate pain or severe pain. 10/01/15   Silver Huguenin Elgergawy, MD  Wound Dressings (SONAFINE EX) Apply 1 application topically as needed (For wound dressing.).     Historical Provider, MD    Physical Exam: Vitals:   10/07/15 2012 10/07/15 2058 10/07/15 2150 10/07/15 2153  BP: 186/79 180/72  (!) 190/61  Pulse: (!) 41 (!) 57  (!) 55  Resp: _0 Temp:    97.5 F (36.4 C)  TempSrc:   Oral Oral  SpO2: 94% 98%  100%  Weight: 70.8 kg (156 lb)   72.8 kg (160 lb 7.9 oz)  Height: _0  (1.575 m)   _1  (1.575 m)      Constitutional: NAD, calm, comfortable Eyes: PERRL, lids and conjunctivae normal ENMT: Mucous membranes are moist. Posterior pharynx clear of any exudate or lesions.Normal dentition.  Neck: normal, supple, no masses, no thyromegaly Respiratory: clear to auscultation bilaterally, no wheezing, no crackles. Normal respiratory effort. No accessory muscle use.  Cardiovascular: Regular rate and rhythm, no murmurs / rubs / gallops. No extremity edema. 2+ pedal pulses. No carotid bruits.  Abdomen: no tenderness, no masses palpated. No hepatosplenomegaly. Bowel sounds positive.  Musculoskeletal: no clubbing / cyanosis. No joint deformity upper and lower extremities. Good ROM, no contractures. Normal muscle tone.  Skin: no rashes, lesions, ulcers. No induration Neurologic: CN 2-12 grossly intact. Sensation intact, DTR normal. Strength 5/5 in all 4.  Psychiatric: Normal judgment and insight. Alert and oriented x 3. Normal mood.    Labs on Admission: I have personally reviewed following labs and imaging studies  CBC:  Recent Labs Lab 10/07/15 1515  WBC 14.6*  NEUTROABS 13.6*  HGB 11.4*  HCT 33.8*  MCV 74.9*  PLT 295*   Basic Metabolic Panel:  Recent Labs Lab  10/07/15 1515  NA 130*  K 4.0  CO2 26  GLUCOSE 137  BUN 25.7  CREATININE 0.7  CALCIUM 8.8   GFR: Estimated Creatinine Clearance: 68.1 mL/min (by C-G formula based on SCr of 0.7 mg/dL). Liver Function Tests:  Recent Labs Lab 10/07/15 1515  AST 12  ALT 16  ALKPHOS 101  BILITOT 0.87  PROT 6.2*  ALBUMIN 2.5*   No results for input(s): LIPASE, AMYLASE in the last 168 hours. No results for input(s): AMMONIA in the last 168 hours. Coagulation Profile: No results for input(s): INR, PROTIME in the last 168 hours. Cardiac Enzymes: No results for input(s): CKTOTAL, CKMB, CKMBINDEX, TROPONINI in the last 168 hours. BNP (last 3 results) No results for input(s): PROBNP in the last 8760 hours. HbA1C: No results for input(s): HGBA1C in the last 72 hours. CBG:  Recent Labs Lab 10/01/15 0805  GLUCAP 110*   Lipid Profile: No results for input(s): CHOL, HDL, LDLCALC, TRIG, CHOLHDL, LDLDIRECT in the last 72 hours. Thyroid Function Tests: No results for input(s): TSH, T4TOTAL, FREET4, T3FREE, THYROIDAB in the last 72 hours. Anemia Panel: No results for input(s): VITAMINB12, FOLATE, FERRITIN, TIBC, IRON, RETICCTPCT in the last 72 hours. Urine analysis:    Component Value Date/Time   COLORURINE AMBER (A) 09/25/2015 0210   APPEARANCEUR CLOUDY (A) 09/25/2015 0210   LABSPEC 1.028 09/25/2015 0210   PHURINE 5.0 09/25/2015 0210   GLUCOSEU NEGATIVE 09/25/2015 0210   HGBUR NEGATIVE 09/25/2015 0210   BILIRUBINUR SMALL (A) 09/25/2015 0210   KETONESUR NEGATIVE 09/25/2015 0210   PROTEINUR NEGATIVE 09/25/2015 0210   UROBILINOGEN 0.2 03/03/2008 1216   NITRITE NEGATIVE 09/25/2015 0210   LEUKOCYTESUR SMALL (A) 09/25/2015 0210   Sepsis Labs: _2 (procalcitonin:4,lacticidven:4) )No results found for this or any previous visit (from the past 240 hour(s)).   Radiological Exams on Admission: Ct Head Wo Contrast  Result Date: 10/07/2015 CLINICAL DATA:  Symptomatic bradycardia to the  40s. Left cerebellar mass seen on recent MRI of 09/28/2015. EXAM: CT HEAD WITHOUT CONTRAST TECHNIQUE: Contiguous axial images were obtained from the base of the skull through the vertex without intravenous contrast. COMPARISON:  CT and MRI from 09/28/2015 FINDINGS: Brain: Left cerebellar 1.5 x 1.6 x 1.8 cm intra-axial mass  with surrounding vasogenic edema is again seen with slight decrease in mass effect on the adjacent fourth ventricle. Edema does cross midline as before. No hemorrhage,, acute infarction or extra-axial fluid collections. Vascular: Minimal calcific atherosclerosis of the skull base. Skull: Probable old calcified cephalohematoma along the left posterior parietal skull. No acute fracture nor bone destruction. Sinuses/Orbits: Normal Other: None IMPRESSION: Known left cerebellar metastasis with vasogenic edema. Subjectively less mass effect on the adjacent fourth ventricle, albeit minimal. Electronically Signed   By: Ashley Royalty M.D.   On: 10/07/2015 19:18    EKG: Independently reviewed.  Assessment/Plan Principal Problem:   Hypertensive urgency Active Problems:   Leg DVT (deep venous thromboembolism), chronic, left (HCC)   Brain metastases ([place])    1. HTN urgency - 1. PRN hydralazine 2. Resume home BP meds but stopping labetalol due to bradycardia 3. Tele monitor for bradycardia 2. Cancer with brain met - 1. Continue decadron 2. EDP spoke with Neurosurgery and they didn't think that this represented a Cushing's response 3. I agree, Cushing's response seems unlikely given lack of progression of symptoms, completely normal and clear mental status. 3. H/o DVT - continue eliquis for now   DVT prophylaxis: Eliquis Code Status: Full - confirmed directly with patient and husband, they want to be a full code at this time. Family Communication: Husband at bedside Consults called: None Admission status: Place in [place], [place] Hospitalists Pager [phone] from  7PM-7AM  If 7AM-7PM, please contact the day physician for the patient [email or web address] Password [email or web address]  10/07/2015, 10:00 PM

## 2015-10-07 NOTE — Progress Notes (Signed)
SYMPTOM MANAGEMENT CLINIC    Chief Complaint: Dehydration, hypertension, bradycardia, constipation  HPI:  [name] 60 y.o. female diagnosed with endometrial cancer.  Patient is currently undergoing both gemcitabine chemotherapy and Zometa infusions.  Also underwent brain SRS radiation today.    No history exists.    Review of Systems  Constitutional: Positive for malaise/fatigue.  Gastrointestinal: Positive for abdominal pain and constipation.  Neurological: Positive for weakness.  All other systems reviewed and are negative.   Past Medical History:  Diagnosis Date  . Chemotherapy-induced neuropathy ([place])   . DVT, lower extremity ([place])    left  02-12-2015  currently on Eliqius  . Endometrial cancer [facility]) oncologist-  dr Marko Plume   02-2015  . 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 Family history of breast cancer   . History of adenomatous polyp of colon    tubulovillious adenoma 09/ 2010  . History of ovarian cyst    complex  . History of radiation therapy 10/21/08-11/23/08 & 12/07/10/13/12/23 2010   ENDOMETRIOID   . History of uterine fibroid   . Hypertension   . Inguinal fluid collection 02/2015   right-drained x2  . Iron deficiency anemia    chronic severe  . Metastasis to lung ([place])   . 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 ABDOMINAL HYSTERECTOMY  2010  . APPLICATION OF WOUND VAC Right 05/12/2015   Procedure: APPLICATION OF WOUND VAC;  Surgeon: Hall Busing, MD;  Location: [facility];  Service: General;  Laterality: Right;  . COLONOSCOPY W/ POLYPECTOMY  09-27-2008  . EXPLORATORY LAPAROTOMY /  TOTAL ABDOMINAL HYSTERECTOMY/ BILATERAL SALPINOOPHORECTOMY/  PARTIAL OMENTECTOMY  03-18-2008  . INGUINAL HERNIA REPAIR Right 05/12/2015   Procedure: INCISION AND DRAINGE RIGHT INGUINAL FLUID COLLECTION ;  Surgeon: Hall Busing, MD;  Location: [facility];  Service:  General;  Laterality: Right;  . IR GENERIC HISTORICAL  08/10/2015   IR US GUIDE VASC ACCESS RIGHT 08/10/2015 Jacqulynn Cadet, MD [facility]-INTERV RAD  . IR GENERIC HISTORICAL  08/10/2015   IR FLUORO GUIDE CV LINE RIGHT 08/10/2015 Jacqulynn Cadet, MD [facility]-INTERV RAD  . porta cath  06/2010   removal  . TUBAL LIGATION  1990's  . UMBILICAL HERNIA REPAIR     infant    has Essential hypertension; NONSPECIFIC ABN FINDING RAD & OTH EXAM GI TRACT; Endometrial cancer ([place]); Uterine cancer ([place]); Poor venous access; Constipation due to pain medication; Noncompliance with medication regimen; Chronic anticoagulation; Leg DVT (deep venous thromboembolism), acute ([place]); Chemotherapy-induced neuropathy ([place]); Cancer associated pain; Iron deficiency anemia due to chronic blood loss; International Federation of Gynecology and Obstetrics (FIGO) stage IVB epithelial ovarian cancer ([place]); Chemotherapy induced nausea and vomiting; DVT (deep venous thrombosis), left; Inguinal lymphocyst; Family history of breast cancer; Adenomatous colon polyp; Mass of right inguinal region; Genetic testing; Malignant neoplasm metastatic to lung [facility]); 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    is allergic to morphine and related and dilaudid [hydromorphone hcl].    Medication List       Accurate as of 10/07/15  5:56 PM. Always use your most recent med list.          apixaban 5 MG Tabs tablet Commonly known  asArne [place] Take 1 tablet (5 mg total) by mouth 2 (two) times daily.   benzonatate 100 MG capsule Commonly known as:  TESSALON Take 1 capsule (100 mg total) by mouth 3 (three) times daily as needed for cough.   CATAPRES 0.1 MG tablet Generic drug:  cloNIDine Take 0.1  mg by mouth daily.   chlorpheniramine-HYDROcodone 10-8 MG/5ML Suer Commonly known as:  TUSSIONEX Take 5 ml by mouth every 8-12 hours as needed for cough.   dexamethasone 4 MG tablet Commonly known as:  DECADRON Take 1 tablet (4 mg total) by mouth 3 (three) times daily.   feeding supplement (ENSURE ENLIVE) Liqd Take 237 mLs by mouth 2 (two) times daily between meals.   ferrous sulfate 325 (65 FE) MG tablet Take 1 tablet (325 mg total) by mouth 2 (two) times daily with a meal.   FLINTSTONES GUMMIES PO Take 1 tablet by mouth daily.   hydrochlorothiazide 25 MG tablet Commonly known as:  HYDRODIURIL Take 25 mg by mouth daily.   HYDROmorphone 2 MG tablet Commonly known as:  DILAUDID TAKE 1 TO 2 TABLETS BY MOUTH EVERY 4 HOURS AS NEEDED FOR SEVERE PAIN   labetalol 100 MG tablet Commonly known as:  NORMODYNE Take 2 tablets (200 mg total) by mouth 3 (three) times daily.   letrozole 2.5 MG tablet Commonly known as:  FEMARA Take 1 tablet (2.5 mg total) by mouth daily.   levETIRAcetam 500 MG tablet Commonly known as:  KEPPRA Take 1 tablet (500 mg total) by mouth 2 (two) times daily.   lidocaine-prilocaine cream Commonly known as:  EMLA Apply to Porta-cath 1-2 hrs prior to access as directed.   LORazepam 0.5 MG tablet Commonly known as:  ATIVAN Take one tab po 30 minutes prior to radiation or MRI.   morphine 15 MG tablet Commonly known as:  MSIR Take 1 tablet (15 mg total) by mouth every 6 (six) hours as needed for moderate pain or severe pain.   ondansetron 8 MG disintegrating tablet Commonly known as:  ZOFRAN ODT Take 1 tablet (8 mg total) by mouth every 8 (eight) hours as needed for nausea or vomiting.   pantoprazole 40 MG tablet Commonly known as:  PROTONIX Take 1 tablet (40 mg total) by mouth daily.   polyethylene glycol packet Commonly known as:  MIRALAX / GLYCOLAX Take 17 g by mouth 2 (two) times daily as needed for mild constipation. Reported on 05/23/2015    potassium chloride 10 MEQ tablet Commonly known as:  K-DUR Take 2 tablets today and then 1 tablet daily   promethazine 25 MG tablet Commonly known as:  PHENERGAN Take 1 tablet (25 mg total) by mouth every 6 (six) hours as needed for nausea or vomiting.   senna-docusate 8.6-50 MG tablet Commonly known as:  Senokot-S Take 1 tablet by mouth 2 (two) times daily.   SONAFINE EX Apply 1 application topically as needed (For wound dressing.).   sucralfate 1 GM/10ML suspension Commonly known as:  CARAFATE Take 10 mLs (1 g total) by mouth 4 (four) times daily -  with meals and at bedtime.   zolpidem 5 MG tablet Commonly known as:  AMBIEN Take 1 tablet (5 mg total) by mouth at bedtime as needed for sleep.        PHYSICAL EXAMINATION  Oncology Vitals 10/07/2015 10/07/2015  Height - -  Weight - -  Weight (lbs) - -  BMI (kg/m2) - -  Temp 97.5 97.8  Pulse 40 43  Resp  17 15  Resp (Historical as of 08/02/11) - -  SpO2 98 100  BSA (m2) - -   BP Readings from Last 2 Encounters:  10/07/15 200/74  10/07/15 (!) 191/69    Physical Exam  Constitutional: She is oriented to person, place, and time and well-developed, well-nourished, and in no distress.  HENT:  Head: Normocephalic and atraumatic.  Eyes: Conjunctivae and EOM are normal. Pupils are equal, round, and reactive to light.  Neck: Normal range of motion.  Pulmonary/Chest: Effort normal. No respiratory distress.  Neurological: She is alert and oriented to person, place, and time.  Patient in wheelchair during brief exam  Skin: Skin is warm.  Psychiatric: Affect normal.  Nursing note and vitals reviewed.   LABORATORY DATA:. Hospital Outpatient Visit on 10/07/2015  Component Date Value Ref Range Status  . WBC 10/07/2015 14.6* 3.9 - 10.3 10e3/uL Final  . NEUT# 10/07/2015 13.6* 1.5 - 6.5 10e3/uL Final  . HGB 10/07/2015 11.4* 11.6 - 15.9 g/dL Final  . HCT 10/07/2015 33.8* 34.8 - 46.6 % Final  . Platelets 10/07/2015 483* 145 -  400 10e3/uL Final  . MCV 10/07/2015 74.9* 79.5 - 101.0 fL Final  . MCH 10/07/2015 25.3  25.1 - 34.0 pg Final  . MCHC 10/07/2015 33.7  31.5 - 36.0 g/dL Final  . RBC 10/07/2015 4.51  3.70 - 5.45 10e6/uL Final  . RDW 10/07/2015 21.7* 11.2 - 14.5 % Final  . lymph# 10/07/2015 0.5* 0.9 - 3.3 10e3/uL Final  . MONO# 10/07/2015 0.5  0.1 - 0.9 10e3/uL Final  . Eosinophils Absolute 10/07/2015 0.0  0.0 - 0.5 10e3/uL Final  . Basophils Absolute 10/07/2015 0.0  0.0 - 0.1 10e3/uL Final  . NEUT% 10/07/2015 93.3* 38.4 - 76.8 % Final  . LYMPH% 10/07/2015 3.2* 14.0 - 49.7 % Final  . MONO% 10/07/2015 3.4  0.0 - 14.0 % Final  . EOS% 10/07/2015 0.0  0.0 - 7.0 % Final  . BASO% 10/07/2015 0.1  0.0 - 2.0 % Final  . nRBC 10/07/2015 0  0 - 0 % Final  . Sodium 10/07/2015 130* 136 - 145 mEq/L Final  . Potassium 10/07/2015 4.0  3.5 - 5.1 mEq/L Final  . Chloride 10/07/2015 95* 98 - 109 mEq/L Final  . CO2 10/07/2015 26  22 - 29 mEq/L Final  . Glucose 10/07/2015 137  70 - 140 mg/dl Final  . BUN 10/07/2015 25.7  7.0 - 26.0 mg/dL Final  . Creatinine 10/07/2015 0.7  0.6 - 1.1 mg/dL Final  . Total Bilirubin 10/07/2015 0.87  0.20 - 1.20 mg/dL Final  . Alkaline Phosphatase 10/07/2015 101  40 - 150 U/L Final  . AST 10/07/2015 12  5 - 34 U/L Final  . ALT 10/07/2015 16  0 - 55 U/L Final  . Total Protein 10/07/2015 6.2* 6.4 - 8.3 g/dL Final  . Albumin 10/07/2015 2.5* 3.5 - 5.0 g/dL Final  . Calcium 10/07/2015 8.8  8.4 - 10.4 mg/dL Final  . Anion Gap 10/07/2015 10  3 - 11 mEq/L Final  . EGFR 10/07/2015 >90  >90 ml/min/1.73 m2 Final    RADIOGRAPHIC STUDIES: No results found.  ASSESSMENT/PLAN:    Uterine cancer [facility]) Patient received her last cycle of gemcitabine chemotherapy on 08/11/2015.  She received her last Zometa infusion on 08/18/2015.  She underwent brain as far as her radiation today.  Patient has no follow-up appointment scheduled with Dr. Marko Plume at this time.  This provider will review all with Dr. Marko Plume;  and then advised  patient regarding any follow-up appointments.  See further notes for details of today's visit.  Elevated blood pressure reading Patient has a history of chronic hypertension.  She states that she has multiple blood pressure medications; but did not take all of them earlier this morning.  She was in radiation oncology this afternoon; and was noted to have a systolic blood pressure as high as 217.  However, patient's heart rate on initial check was 46.  Her baseline heart rate is in the 80s.  Patient denied any chest pain, chest pressure, or shortness of breath.  She had no symptoms whatsoever of her hypertension earlier this afternoon.  Once patient received her brain irradiation this afternoon; patient was discharged and instructed to present to the symptom management clinic for further evaluation and management.  On initial check-patient's systolic blood pressure was 191, heart rate was down to 40.  Also, patient was complaining of some generalized abdominal discomfort.  The colon is stated that she feels constipated.  She denies any recent nausea or vomiting.  She states that she is hungry at this present time.  See further notes for details of today's visit.  Due to patient's significantly elevated blood pressure; and low heart rate-patient will be transported to the emergency department this evening for further evaluation and management.  Brief history.  Report were called to the emergency department charge nurse; prior to the patient being transported via wheelchair to the emergency department per the [place].    CODE STATUS: Patient has no advance directives in her chart; but does have a DO NOT RESUSCITATE documented in her problem list of her chart.  Dehydration Patient states that she was in the emergency department last weekend with dehydration and received IV fluid rehydration.  She denies any nausea, vomiting, or diarrhea.  Check.  He feels constipated today.   She also feels increasingly fatigued and mildly dehydrated.  Sodium was down to 130.  Patient will be transported to the emergency department for further evaluation and management today.  Abdominal pain Patient states she has some vague, generalized abdominal discomfort and feels constipated today.  She denies any recent nausea, vomiting, or recent fever/chills.  Patient will be transported to the emergency department today for further evaluation and management.   Patient stated understanding of all instructions; and was in agreement with this plan of care. The patient knows to call the clinic with any problems, questions or concerns.   Total time spent with patient was 15 minutes;  with greater than 75 percent of that time spent in face to face counseling regarding patient's symptoms,  and coordination of care and follow up.  Disclaimer:This dictation was prepared with Dragon/digital dictation along with [employer]. Any transcriptional errors that result from this process are unintentional.  Drue Second, NP 10/07/2015  Pt arrived in [facility] per family members via w/c from Radiation Oncology.  Pt states she feels dizzy. Assisted to recliner. Seen per Selena Lesser, NP  Arrangements made for transfer to ED. Pt and family in agreement.Report given to Marzetta Board, RN per Selena Lesser,, NP  Transported to ED via w/c per this writer with family in attemdance. Re-confirmed report with Marzetta Board, RN.   Port had been accessed per Rad. Onc. Nurse prior to arrival in [facility].

## 2015-10-07 NOTE — ED Provider Notes (Signed)
[facility] DEPT Provider Note   CSN: [id number] Arrival date & time: 10/07/15  1725     History   Chief Complaint Chief Complaint  Patient presents with  . Hypertension    HPI Liane E Peron is a 60 y.o. female.  60 year old female with history of endometrial cancer with brain metastasis, hypertension, DVT on L [place] who presents with hypertension and bradycardia. The patient went to her oncology appointment today and was noted to be hypertensive and bradycardic in the clinic. She was sent here for evaluation. She reports that she has not taken any of her blood pressure medications today because she did not feel well this morning. She denies any nausea or vomiting but states that her abdomen felt full like she was constipated. Last bowel movement was this afternoon and was soft. She denies any headache or visual changes. No fevers or neck pain. She has taken decadron today.   The history is provided by the patient.    Past Medical History:  Diagnosis Date  . Chemotherapy-induced neuropathy ([place])   . DVT, lower extremity ([place])    left  02-12-2015  currently on Eliqius  . Endometrial cancer [facility]) oncologist-  dr Marko Plume   02-2015  . 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 Family history of breast cancer   . History of adenomatous polyp of colon    tubulovillious adenoma 09/ 2010  . History of ovarian cyst    complex  . History of radiation therapy 10/21/08-11/23/08 & 12/07/10/13/12/23 2010   ENDOMETRIOID   . History of uterine fibroid   . Hypertension   . Inguinal fluid collection 02/2015   right-drained x2  . Iron deficiency anemia    chronic severe  . Metastasis to lung ([place])   . 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 Elevated blood pressure reading 10/07/2015  . Abdominal pain 10/07/2015  . Dehydration 10/07/2015  . Hypertensive urgency 10/07/2015  . Brain metastases ([place]) 09/29/2015  .  Endometrial cancer, FIGO stage IVB ([place])   . Pericardial effusion 09/23/2015  . DNR (do not resuscitate) 09/06/2015  . Malignant neoplasm metastatic to lung ([place]) 08/20/2015  . 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 Genetic testing 07/07/2015  . Adenomatous colon polyp 05/10/2015  . Mass of right inguinal region 05/10/2015  . Family history of breast cancer   . Inguinal lymphocyst 04/22/2015  . Chemotherapy induced nausea and vomiting 03/05/2015  . DVT (deep venous thrombosis), left 03/05/2015  . Poor venous access 02/22/2015  . Constipation due to pain medication 02/22/2015  . Noncompliance with medication regimen 02/22/2015  . Chronic anticoagulation 02/22/2015  . Leg DVT (deep venous thromboembolism), acute ([place]) 02/22/2015  . Chemotherapy-induced neuropathy ([place]) 02/22/2015  . Cancer associated pain 02/22/2015  . Iron deficiency anemia due to chronic blood loss 02/22/2015  . International Federation of Gyne[employer] and Obstetrics (FIGO) stage IVB epithelial ovarian cancer ([place]) 02/22/2015  . Uterine cancer ([place]) 02/12/2015  . Endometrial cancer ([place]) 11/20/2010  . Essential hypertension 09/23/2008  . NONSPECIFIC ABN FINDING RAD & OTH EXAM GI TRACT 09/23/2008    Past Surgical History:  Procedure Laterality Date  . ABDOMINAL HYSTERECTOMY  2010  . APPLICATION OF WOUND VAC Right 05/12/2015   Procedure: APPLICATION OF WOUND VAC;  Surgeon: Hall Busing, MD;  Location: [facility];  Service: General;  Laterality: Right;  . COLONOSCOPY W/ POLYPECTOMY  09-27-2008  . EXPLORATORY LAPAROTOMY /  TOTAL ABDOMINAL HYSTERECTOMY/ BILATERAL SALPINOOPHORECTOMY/  PARTIAL OMENTECTOMY  03-18-2008  . INGUINAL HERNIA REPAIR Right 05/12/2015   Procedure: INCISION AND DRAINGE RIGHT INGUINAL FLUID COLLECTION ;  Surgeon: Hall Busing, MD;  Location: [facility];  Service: General;   Laterality: Right;  . IR GENERIC HISTORICAL  08/10/2015   IR US GUIDE VASC ACCESS RIGHT 08/10/2015 Jacqulynn Cadet, MD [facility]-INTER[facility] RAD  . IR GENERIC HISTORICAL  08/10/2015   IR FLUORO GUIDE CV LINE RIGHT 08/10/2015 Jacqulynn Cadet, MD [facility]INTER[facility]  . porta cath  06/2010   removal  . TUBAL LIGATION  1990's  . UMBILICAL HERNIA REPAIR     infant    OB History    No data available       Home Medications    Prior to Admission medications   Medication Sig Start Date End Date Taking? Authorizing Provider  apixaban (ELIQUIS) 5 MG TABS tablet Take 1 tablet (5 mg total) by mouth 2 (two) times daily. 03/03/15  Yes Lennis Marion Downer, MD  cloNIDine (CATAPRES) 0.1 MG tablet Take 0.1 mg by mouth at bedtime.  02/25/15  Yes Historical Provider, MD  dexamethasone (DECADRON) 4 MG tablet Take 1 tablet (4 mg total) by mouth 3 (three) times daily. 10/01/15  Yes Silver Huguenin Elgergawy, MD  hydrochlorothiazide (HYDRODIURIL) 25 MG tablet Take 25 mg by mouth daily.   11/01/08  Yes Historical Provider, MD  HYDROmorphone (DILAUDID) 2 MG tablet TAKE 1 TO 2 TABLETS BY MOUTH EVERY 4 HOURS AS NEEDED FOR SEVERE PAIN 08/18/15  Yes Historical Provider, MD  labetalol (NORMODYNE) 100 MG tablet Take 2 tablets (200 mg total) by mouth 3 (three) times daily. 08/25/15  Yes Lennis Marion Downer, MD  letrozole (FEMARA) 2.5 MG tablet Take 1 tablet (2.5 mg total) by mouth daily. 08/18/15  Yes Lennis Marion Downer, MD  levETIRAcetam (KEPPRA) 500 MG tablet Take 1 tablet (500 mg total) by mouth 2 (two) times daily. 10/01/15  Yes Albertine Patricia, MD  LORazepam (ATIVAN) 0.5 MG tablet Take one tab po 30 minutes prior to radiation or MRI. 10/06/15  Yes Hayden Pedro, PA-C  pantoprazole (PROTONIX) 40 MG tablet Take 1 tablet (40 mg total) by mouth daily. 03/14/15  Yes Lennis Marion Downer, MD  Pediatric Multivit-Minerals-C (FLINTSTONES GUMMIES PO) Take 1 tablet by mouth daily.   Yes Historical Provider, MD  polyethylene glycol (MIRALAX / GLYCOLAX) packet Take  17 g by mouth 2 (two) times daily as needed for mild constipation. Reported on 05/23/2015   Yes Historical Provider, MD  potassium chloride (K-DUR) 10 MEQ tablet Take 2 tablets today and then 1 tablet daily Patient taking differently: Take 20 mEq by mouth daily.  05/23/15  Yes Lennis P Livesay, MD  senna-docusate (SENOKOT-S) 8.6-50 MG tablet Take 1 tablet by mouth 2 (two) times daily. 10/01/15  Yes Albertine Patricia, MD  zolpidem (AMBIEN) 5 MG tablet Take 1 tablet (5 mg total) by mouth at bedtime as needed for sleep. 09/02/15  Yes Lennis Marion Downer, MD  chlorpheniramine-HYDROcodone (TUSSIONEX) 10-8 MG/5ML SUER Take 5 ml by mouth every 8-12 hours as needed for cough. Patient not taking: Reported on 10/07/2015 08/15/15   Gery Pray, MD  feeding supplement, ENSURE ENLIVE, (ENSURE ENLIVE) LIQD Take 237 mLs by mouth 2 (two) times daily between meals. 10/01/15   Silver Huguenin Elgergawy, MD  lidocaine-prilocaine (EMLA) cream Apply to Porta-cath 1-2 hrs prior to  access as directed. Patient taking differently: Apply 1 application topically as needed (Apply to port-a-cath 1-2 hours prior to access.).  08/04/15   Lennis Marion Downer, MD  morphine (MSIR) 15 MG tablet Take 1 tablet (15 mg total) by mouth every 6 (six) hours as needed for moderate pain or severe pain. 10/01/15   Silver Huguenin Elgergawy, MD  Wound Dressings (SONAFINE EX) Apply 1 application topically as needed (For wound dressing.).     Historical Provider, MD    Family History Family History  Problem Relation Age of Onset  . Bone cancer Paternal Grandmother     OSSEOUS METASTASIS  . Heart disease Father   . Heart disease Sister   . Bone cancer Maternal Grandmother 75  . Leukemia Maternal Grandfather 61  . Liver cancer Maternal Uncle   . Breast cancer Cousin 41    maternal first cousin    Social History Social History  Substance Use Topics  . Smoking status: Never Smoker  . Smokeless tobacco: Never Used  . Alcohol use No     Allergies   Morphine  and related and Dilaudid [hydromorphone hcl]   Review of Systems Review of Systems 10 Systems reviewed and are negative for acute change except as noted in the HPI.   Physical Exam Updated Vital Signs BP 180/72 (BP Location: Right Arm)   Pulse (!) 57   Temp 97.8 F (36.6 C) (Oral)   Resp 15   Ht 5\' 2"  (1.575 m)   Wt 156 lb (70.8 kg)   SpO2 98%   BMI 28.53 kg/m   Physical Exam  Constitutional: She is oriented to person, place, and time. She appears well-developed and well-nourished. No distress.  Awake, alert  HENT:  Head: Normocephalic and atraumatic.  Eyes: Conjunctivae and EOM are normal. Pupils are equal, round, and reactive to light.  Neck: Neck supple.  Cardiovascular: Regular rhythm and normal heart sounds.  Bradycardia present.   No murmur heard. Pulmonary/Chest: Effort normal and breath sounds normal. No respiratory distress.  Abdominal: Soft. Bowel sounds are normal. She exhibits no distension. There is no tenderness.  Musculoskeletal: She exhibits no edema.  Neurological: She is alert and oriented to person, place, and time. She has normal reflexes. No cranial nerve deficit. She exhibits normal muscle tone.  Fluent speech, normal finger-to-nose testing, negative pronator drift, no clonus 5/5 strength and normal sensation x all 4 extremities  Skin: Skin is warm and dry.  Psychiatric: She has a normal mood and affect. Judgment and thought content normal.  Nursing note and vitals reviewed.    ED Treatments / Results  Labs (all labs ordered are listed, but only abnormal results are displayed) Labs Reviewed - No data to display  EKG  EKG Interpretation  Date/Time:  Friday October 07 2015 17:26:29 EDT Ventricular Rate:  42 PR Interval:    QRS Duration: 155 QT Interval:  509 QTC Calculation: 426 R Axis:   -52 Text Interpretation:  Sinus bradycardia RBBB and LAFB Confirmed by Sherry Ruffing MD, CHRISTOPHER [phone]) on 10/07/2015 5:30:57 PM       Radiology Ct  Head Wo Contrast  Result Date: 10/07/2015 CLINICAL DATA:  Symptomatic bradycardia to the 40s. Left cerebellar mass seen on recent MRI of 09/28/2015. EXAM: CT HEAD WITHOUT CONTRAST TECHNIQUE: Contiguous axial images were obtained from the base of the skull through the vertex without intravenous contrast. COMPARISON:  CT and MRI from 09/28/2015 FINDINGS: Brain: Left cerebellar 1.5 x 1.6 x 1.8 cm intra-axial mass with surrounding vasogenic  edema is again seen with slight decrease in mass effect on the adjacent fourth ventricle. Edema does cross midline as before. No hemorrhage,, acute infarction or extra-axial fluid collections. Vascular: Minimal calcific atherosclerosis of the skull base. Skull: Probable old calcified cephalohematoma along the left posterior parietal skull. No acute fracture nor bone destruction. Sinuses/Orbits: Normal Other: None IMPRESSION: Known left cerebellar metastasis with vasogenic edema. Subjectively less mass effect on the adjacent fourth ventricle, albeit minimal. Electronically Signed   By: Ashley Royalty M.D.   On: 10/07/2015 19:18    Procedures Procedures (including critical care time)  Medications Ordered in ED Medications  apixaban (ELIQUIS) tablet 5 mg (not administered)  cloNIDine (CATAPRES) tablet 0.1 mg (not administered)  dexamethasone (DECADRON) tablet 4 mg (not administered)  hydrochlorothiazide (HYDRODIURIL) tablet 25 mg (not administered)  feeding supplement (ENSURE ENLIVE) (ENSURE ENLIVE) liquid 237 mL (not administered)  HYDROmorphone (DILAUDID) tablet 2-4 mg (not administered)  levETIRAcetam (KEPPRA) tablet 500 mg (not administered)  letrozole (FEMARA) tablet 2.5 mg (not administered)  morphine (MSIR) tablet 15 mg (not administered)  lidocaine-prilocaine (EMLA) cream 1 application (not administered)  zolpidem (AMBIEN) tablet 5 mg (not administered)  senna-docusate (Senokot-S) tablet 1 tablet (not administered)  potassium chloride (K-DUR) CR tablet 20 mEq  (not administered)  polyethylene glycol (MIRALAX / GLYCOLAX) packet 17 g (not administered)  pantoprazole (PROTONIX) EC tablet 40 mg (not administered)  hydrALAZINE (APRESOLINE) injection 10-20 mg (not administered)  hydrochlorothiazide (MICROZIDE) capsule 25 mg (25 mg Oral Given 10/07/15 1834)  cloNIDine (CATAPRES) tablet 0.1 mg (0.1 mg Oral Given 10/07/15 1833)  sodium chloride 0.9 % bolus 1,000 mL (1,000 mLs Intravenous New Bag/Given 10/07/15 2103)     Initial Impression / Assessment and Plan / ED Course  I have reviewed the triage vital signs and the nursing notes.  Pertinent labs & imaging results that were available during my care of the patient were reviewed by me and considered in my medical decision making (see chart for details).  Clinical Course   Patient sent from [facility] today for hypertension and bradycardia noted at the visit today. On arrival, she had bradycardia near 40 and was hypertensive at 207/81. She was comfortable on exam and had a normal neurologic exam. Gave the patient her home hydralazine and clonidine and obtained head CT to evaluate for hemorrhage or worsening edema. EKG showed sinus bradycardia.  Head CT shows stable metastasis with vasogenic edema, no new findings. I discussed the patient's presentation and vital signs with neurosurgery, Dr. Ronnald Ramp, who felt that it was extremely unlikely to be Cushing's response as explanation for her bradycardia because of the location of the metastasis of the fact that the patient has a normal neurologic exam with GCS 15. I suspect that her severe hypertension today is related to the fact that she has not taken any of her blood pressure medications. After several hours of remaining in the 40s, the patient's heart rate has improved to the high 50s but she has remained hypertensive. Her home hypertensive medication regimen includes labetalol which she will not be able to take with this bradycardia. Furthermore, I'm concerned  about her risk of intracranial hemorrhage because of her severe hypertension, metastasis, and Eliquis  use. I discussed admission with Dr. Alcario Drought and pt admitted for further treatment of BP.   Final Clinical Impressions(s) / ED Diagnoses   Final diagnoses:  None    New Prescriptions New Prescriptions   No medications on file     Sharlett Iles, MD 10/07/15  2119  

## 2015-10-07 NOTE — ED Notes (Signed)
Bed: RESA Expected date:  Expected time:  Means of arrival:  Comments: Cancer center 

## 2015-10-07 NOTE — Telephone Encounter (Signed)
I spoke with the patient's other daughter Scarlette Ar after her daughter Verdie Drown called stating her mother was having trouble with anxiety and hadn't slept well. She had been given a prescription for Ativan yesterday, but apparently the family didn't go to pick it up. We will give the patient Ativan 0.5 mg, one tablet x1 today and nursing has been informed and will provide this to the patient.

## 2015-10-07 NOTE — Assessment & Plan Note (Signed)
Patient received her last cycle of gemcitabine chemotherapy on 08/11/2015.  She received her last Zometa infusion on 08/18/2015.  She underwent brain as far as her radiation today.  Patient has no follow-up appointment scheduled with Dr. Marko Plume at this time.  This provider will review all with Dr. Marko Plume; and then advised patient regarding any follow-up appointments.  See further notes for details of today's visit.

## 2015-10-07 NOTE — Progress Notes (Signed)
Patient's porta cath was accessed after 2 attempts with blood return noted.

## 2015-10-07 NOTE — Assessment & Plan Note (Signed)
Patient states she has some vague, generalized abdominal discomfort and feels constipated today.  She denies any recent nausea, vomiting, or recent fever/chills.  Patient will be transported to the emergency department today for further evaluation and management.

## 2015-10-07 NOTE — Op Note (Signed)
  Name: [name]  MRN: [id number]  Date: 10/07/2015   DOB: November 28, 1955  Stereotactic Radiosurgery Operative Note  PRE-OPERATIVE DIAGNOSIS:  Solitary Brain Metastasis  POST-OPERATIVE DIAGNOSIS:  Solitary Brain Metastasis  PROCEDURE:  Stereotactic Radiosurgery  SURGEON:  Peggyann Shoals, MD  NARRATIVE: The patient underwent a radiation treatment planning session in the radiation oncology simulation suite under the care of the radiation oncology physician and physicist.  I participated closely in the radiation treatment planning afterwards. The patient underwent planning CT which was fused to 3T high resolution MRI with 1 mm axial slices.  These images were fused on the planning system.  We contoured the gross target volumes and subsequently expanded this to yield the Planning Target Volume. I actively participated in the planning process.  I helped to define and review the target contours and also the contours of the optic pathway, eyes, brainstem and selected nearby organs at risk.  All the dose constraints for critical structures were reviewed and compared to AAPM Task Group 101.  The prescription dose conformity was reviewed.  I approved the plan electronically.    Accordingly, [name] was brought to the TrueBeam stereotactic radiation treatment linac and placed in the custom immobilization mask.  The patient was aligned according to the IR fiducial markers with BrainLab Exactrac, then orthogonal x-rays were used in ExacTrac with the 6DOF robotic table and the shifts were made to align the patient  [name] received stereotactic radiosurgery uneventfully.    The detailed description of the procedure is recorded in the radiation oncology procedure note.  I was present for the duration of the procedure.  DISPOSITION:  Following delivery, the patient was transported to nursing in stable condition and monitored for possible acute effects to be discharged to home in stable  condition with follow-up in one month.  Peggyann Shoals, MD 10/07/2015 4:55 PM

## 2015-10-08 ENCOUNTER — Other Ambulatory Visit: Payer: Self-pay | Admitting: Oncology

## 2015-10-08 DIAGNOSIS — I160 Hypertensive urgency: Secondary | ICD-10-CM | POA: Diagnosis present

## 2015-10-08 DIAGNOSIS — R001 Bradycardia, unspecified: Secondary | ICD-10-CM | POA: Diagnosis present

## 2015-10-08 DIAGNOSIS — T451X5A Adverse effect of antineoplastic and immunosuppressive drugs, initial encounter: Secondary | ICD-10-CM | POA: Diagnosis present

## 2015-10-08 DIAGNOSIS — Z8542 Personal history of malignant neoplasm of other parts of uterus: Secondary | ICD-10-CM | POA: Diagnosis not present

## 2015-10-08 DIAGNOSIS — C779 Secondary and unspecified malignant neoplasm of lymph node, unspecified: Secondary | ICD-10-CM | POA: Diagnosis present

## 2015-10-08 DIAGNOSIS — Z803 Family history of malignant neoplasm of breast: Secondary | ICD-10-CM | POA: Diagnosis not present

## 2015-10-08 DIAGNOSIS — Z806 Family history of leukemia: Secondary | ICD-10-CM | POA: Diagnosis not present

## 2015-10-08 DIAGNOSIS — Z8249 Family history of ischemic heart disease and other diseases of the circulatory system: Secondary | ICD-10-CM | POA: Diagnosis not present

## 2015-10-08 DIAGNOSIS — D509 Iron deficiency anemia, unspecified: Secondary | ICD-10-CM | POA: Diagnosis present

## 2015-10-08 DIAGNOSIS — E871 Hypo-osmolality and hyponatremia: Secondary | ICD-10-CM | POA: Diagnosis present

## 2015-10-08 DIAGNOSIS — K760 Fatty (change of) liver, not elsewhere classified: Secondary | ICD-10-CM | POA: Diagnosis present

## 2015-10-08 DIAGNOSIS — G620 Drug-induced polyneuropathy: Secondary | ICD-10-CM | POA: Diagnosis present

## 2015-10-08 DIAGNOSIS — Z808 Family history of malignant neoplasm of other organs or systems: Secondary | ICD-10-CM | POA: Diagnosis not present

## 2015-10-08 DIAGNOSIS — Z800 Family history of malignant neoplasm of digestive organs: Secondary | ICD-10-CM | POA: Diagnosis not present

## 2015-10-08 DIAGNOSIS — Z8543 Personal history of malignant neoplasm of ovary: Secondary | ICD-10-CM | POA: Diagnosis not present

## 2015-10-08 DIAGNOSIS — I82502 Chronic embolism and thrombosis of unspecified deep veins of left lower extremity: Secondary | ICD-10-CM | POA: Diagnosis present

## 2015-10-08 DIAGNOSIS — I10 Essential (primary) hypertension: Secondary | ICD-10-CM | POA: Diagnosis present

## 2015-10-08 DIAGNOSIS — C7931 Secondary malignant neoplasm of brain: Secondary | ICD-10-CM | POA: Diagnosis present

## 2015-10-08 DIAGNOSIS — Z79891 Long term (current) use of opiate analgesic: Secondary | ICD-10-CM | POA: Diagnosis not present

## 2015-10-08 DIAGNOSIS — Z66 Do not resuscitate: Secondary | ICD-10-CM | POA: Diagnosis present

## 2015-10-08 DIAGNOSIS — Z79899 Other long term (current) drug therapy: Secondary | ICD-10-CM | POA: Diagnosis not present

## 2015-10-08 DIAGNOSIS — Z7901 Long term (current) use of anticoagulants: Secondary | ICD-10-CM | POA: Diagnosis not present

## 2015-10-08 DIAGNOSIS — Z885 Allergy status to narcotic agent status: Secondary | ICD-10-CM | POA: Diagnosis not present

## 2015-10-08 DIAGNOSIS — C7800 Secondary malignant neoplasm of unspecified lung: Secondary | ICD-10-CM | POA: Diagnosis present

## 2015-10-08 MED ORDER — ONDANSETRON HCL 4 MG/2ML IJ SOLN
4.0000 mg | Freq: Four times a day (QID) | INTRAMUSCULAR | Status: DC | PRN
  Filled 2015-10-08: qty 2

## 2015-10-08 MED ORDER — HYDRALAZINE HCL 20 MG/ML IJ SOLN
10.0000 mg | INTRAMUSCULAR | Status: DC | PRN
  Administered 2015-10-08: 10 mg via INTRAVENOUS
  Filled 2015-10-08: qty 1

## 2015-10-08 MED ORDER — METOCLOPRAMIDE HCL 5 MG/ML IJ SOLN
10.0000 mg | Freq: Three times a day (TID) | INTRAMUSCULAR | Status: DC | PRN
  Administered 2015-10-08 – 2015-10-09 (×2): 10 mg via INTRAVENOUS
  Filled 2015-10-08 (×2): qty 2

## 2015-10-08 MED ORDER — METOCLOPRAMIDE HCL 5 MG/ML IJ SOLN
5.0000 mg | Freq: Once | INTRAMUSCULAR | Status: AC
  Administered 2015-10-08: 5 mg via INTRAVENOUS
  Filled 2015-10-08: qty 2

## 2015-10-08 MED ORDER — HYDRALAZINE HCL 25 MG PO TABS
25.0000 mg | ORAL_TABLET | Freq: Three times a day (TID) | ORAL | Status: DC
  Administered 2015-10-08 – 2015-10-09 (×4): 25 mg via ORAL
  Filled 2015-10-08 (×4): qty 1

## 2015-10-08 NOTE — Progress Notes (Signed)
PROGRESS NOTE                                                                                                                                                                                                             Patient Demographics:    [name], is a 60 y.o. female, DOB - [birth year], RH:[id number]  Admit date - 10/07/2015   Admitting Physician Etta Quill, DO  Outpatient Primary MD for the patient is Tuckerman  LOS - 0  Outpatient Specialists: Dr Sheralyn Boatman  Chief Complaint  Patient presents with  . Hypertension       Brief Narrative   60 y/o female with endometrial ca with brain mets, currently on radiation, HTN, Hx of DVT on eliquis, was seen for her radiation therapy and found to have hypertensive urgency and bradycardic to 40s. Placed on observation.   Subjective:   Denies any symptoms. Heart rate as low as 34 during the night.   Assessment  & Plan :    Principal Problem: Sinus bradycardia Asymptomatic. Suspected due to labetalol which is discontinued. Heart rate as low as 34 on the monitor. Discussed with cardiology Dr. Meda Coffee who recommended to monitor off labetalol for another day and if heart is still very low or symptomatic, would consult. Pacer pads at bedside.    Hypertensive urgency Resumed her HCTZ and clonidine. Discontinue labetalol and switch to schedule hydralazine. (Titrated as needed). Blood pressure better controlled.  Active Problems:   Leg DVT (deep venous thromboembolism), chronic, left (HCC) On eliquis    Endometrial carcinoma with Brain metastases ([place]) Getting daily radiation as outpatient. Continue Decadron and letrozole.      Code Status : Full code  Family Communication  : Daughter at bedside  Disposition Plan  : Home tomorrow if heart rate and BP stable.  Barriers For Discharge : Active symptoms  Consults  :   None  Procedures  : None  DVT Prophylaxis  :  Eliquis  Lab Results  Component Value Date   PLT 483 (H) 10/07/2015    Antibiotics  :    Anti-infectives    None        Objective:   Vitals:   10/07/15 2150 10/07/15 2153 10/08/15 0153 10/08/15 0530  BP:  (!) 190/61 (!) 184/76 (!) 139/51  Pulse:  Marland Kitchen)  55 63 (!) 51  Resp:  14 (!) 22 16  Temp:  97.5 F (36.4 C) 98 F (36.7 C) 97.4 F (36.3 C)  TempSrc: Oral Oral Oral Oral  SpO2:  100% 98% 100%  Weight:  72.8 kg (160 lb 7.9 oz)    Height:  5\' 2"  (1.575 m)      Wt Readings from Last 3 Encounters:  10/07/15 72.8 kg (160 lb 7.9 oz)  10/01/15 74.1 kg (163 lb 5.8 oz)  09/13/15 67.2 kg (148 lb 1.6 oz)     Intake/Output Summary (Last 24 hours) at 10/08/15 1316 Last data filed at 10/08/15 0700  Gross per 24 hour  Intake              360 ml  Output                0 ml  Net              360 ml     Physical Exam  Gen: not in distress HEENT:  moist mucosa, supple neck Chest: clear b/l, no added sounds CVS: S1 and S2 Bradycardic, no murmurs rub or gallop GI: soft, NT, ND,  Musculoskeletal: warm, no edema     Data Review:    CBC  Recent Labs Lab 10/07/15 1515  WBC 14.6*  HGB 11.4*  HCT 33.8*  PLT 483*  MCV 74.9*  MCH 25.3  MCHC 33.7  RDW 21.7*  LYMPHSABS 0.5*  MONOABS 0.5  EOSABS 0.0  BASOSABS 0.0    Chemistries   Recent Labs Lab 10/07/15 1515  NA 130*  K 4.0  CO2 26  GLUCOSE 137  BUN 25.7  CREATININE 0.7  CALCIUM 8.8  AST 12  ALT 16  ALKPHOS 101  BILITOT 0.87   ------------------------------------------------------------------------------------------------------------------ No results for input(s): CHOL, HDL, LDLCALC, TRIG, CHOLHDL, LDLDIRECT in the last 72 hours.  No results found for: HGBA1C ------------------------------------------------------------------------------------------------------------------ No results for input(s): TSH, T4TOTAL, T3FREE, THYROIDAB in the last 72  hours.  Invalid input(s): FREET3 ------------------------------------------------------------------------------------------------------------------ No results for input(s): VITAMINB12, FOLATE, FERRITIN, TIBC, IRON, RETICCTPCT in the last 72 hours.  Coagulation profile No results for input(s): INR, PROTIME in the last 168 hours.  No results for input(s): DDIMER in the last 72 hours.  Cardiac Enzymes No results for input(s): CKMB, TROPONINI, MYOGLOBIN in the last 168 hours.  Invalid input(s): CK ------------------------------------------------------------------------------------------------------------------ No results found for: BNP  Inpatient Medications  Scheduled Meds: . apixaban  5 mg Oral BID  . cloNIDine  0.1 mg Oral QHS  . dexamethasone  4 mg Oral TID  . feeding supplement (ENSURE ENLIVE)  237 mL Oral BID BM  . hydrochlorothiazide  25 mg Oral Daily  . letrozole  2.5 mg Oral Daily  . levETIRAcetam  500 mg Oral BID  . pantoprazole  40 mg Oral Daily  . potassium chloride  20 mEq Oral Daily  . senna-docusate  1 tablet Oral BID   Continuous Infusions:  PRN Meds:.hydrALAZINE, HYDROmorphone, lidocaine-prilocaine, morphine, polyethylene glycol, zolpidem  Micro Results No results found for this or any previous visit (from the past 240 hour(s)).  Radiology Reports Dg Chest 2 View  Result Date: 09/23/2015 CLINICAL DATA:  Mid chest pain hand shortness of breath since 6 a.m. today. EXAM: CHEST  2 VIEW COMPARISON:  09/12/2015 FINDINGS: Left upper lobe mass again noted not significantly changed. Right Port-A-Cath is unchanged. Mild cardiomegaly. No confluent opacity on the right. No effusions or acute bony abnormality. IMPRESSION: Stable left upper lobe  mass. Mild cardiomegaly. Electronically Signed   By: Rolm Baptise M.D.   On: 09/23/2015 10:56   Ct Head Wo Contrast  Result Date: 10/07/2015 CLINICAL DATA:  Symptomatic bradycardia to the 40s. Left cerebellar mass seen on recent  MRI of 09/28/2015. EXAM: CT HEAD WITHOUT CONTRAST TECHNIQUE: Contiguous axial images were obtained from the base of the skull through the vertex without intravenous contrast. COMPARISON:  CT and MRI from 09/28/2015 FINDINGS: Brain: Left cerebellar 1.5 x 1.6 x 1.8 cm intra-axial mass with surrounding vasogenic edema is again seen with slight decrease in mass effect on the adjacent fourth ventricle. Edema does cross midline as before. No hemorrhage,, acute infarction or extra-axial fluid collections. Vascular: Minimal calcific atherosclerosis of the skull base. Skull: Probable old calcified cephalohematoma along the left posterior parietal skull. No acute fracture nor bone destruction. Sinuses/Orbits: Normal Other: None IMPRESSION: Known left cerebellar metastasis with vasogenic edema. Subjectively less mass effect on the adjacent fourth ventricle, albeit minimal. Electronically Signed   By: Ashley Royalty M.D.   On: 10/07/2015 19:18   Ct Head Wo Contrast  Result Date: 09/28/2015 CLINICAL DATA:  60 year old female status post fall in the middle of the night. On Coumadin. Personal history of endometrial carcinoma, and large left lung mass in July. Initial encounter. EXAM: CT HEAD WITHOUT CONTRAST TECHNIQUE: Contiguous axial images were obtained from the base of the skull through the vertex without intravenous contrast. COMPARISON:  PET-CT 08/01/2015. FINDINGS: Brain: Posterior fossa mass effect and abnormal hypodensity in the central and left cerebellar hemisphere encompassing an area of about 3.5 cm (series 2, image 8). Mass effect on the fourth ventricle, but no ventriculomegaly. Crowding of the pre pontine cistern which remains patent. Edema mildly crosses midline to the central right cerebellum. Supratentorial gray-white matter differentiation appears normal. No supratentorial mass effect. Partially empty sella incidentally noted. No acute intracranial hemorrhage identified. No cortically based acute infarct  identified. Vascular: Mild Calcified atherosclerosis at the skull base. Skull: left posterior skull exostosis incidentally noted. No acute or suspicious osseous lesion identified. Sinuses/Orbits: Clear. Other: Negative orbit and scalp soft tissues. IMPRESSION: 1. Cerebellar edema centrally on the left with suggestion of an underlying 3.5 cm left cerebellar mass. Mass effect on the fourth ventricle but no ventriculomegaly or loss of basilar cistern patency. 2. Cerebellar metastatic disease favored in this clinical setting. Follow-up Brain MRI without and with contrast would characterize further. 3. No other acute or metastatic intracranial abnormality identified. Electronically Signed   By: Genevie Ann M.D.   On: 09/28/2015 10:35   Ct Chest W Contrast  Result Date: 09/23/2015 CLINICAL DATA:  History of endometrial carcinoma with metastatic disease and chest pain EXAM: CT CHEST, ABDOMEN, AND PELVIS WITH CONTRAST TECHNIQUE: Multidetector CT imaging of the chest, abdomen and pelvis was performed following the standard protocol during bolus administration of intravenous contrast. CONTRAST:  177mL ISOVUE-300 IOPAMIDOL (ISOVUE-300) INJECTION 61% COMPARISON:  06/14/2015, 08/01/2015 FINDINGS: CT CHEST FINDINGS Cardiovascular: Bovine arch anomaly is noted. The thoracic aorta show some calcific changes without evidence of dissection or aneurysmal dilatation. The pulmonary artery as visualized is within normal limits. Timing was not optimum for pulmonary artery evaluation. Mild pericardial effusion is noted new from the prior exam. Mediastinum/Nodes: The thoracic inlet is within normal limits. There is again noted a 80 somewhat necrotic mass which abuts the mediastinum on the left. It measures approximately 7.2 x 6.6 cm in greatest transverse and AP dimensions respectively. This is a slight decrease when compared with the prior exam.  Some mediastinal adenopathy is noted in the aorticopulmonary window measuring 3.0 cm in short  axis. This also demonstrates central decreased attenuation consistent with necrosis. Lungs/Pleura: The lungs are well aerated bilaterally and again demonstrate a large left upper lobe mass lesion which abuts the major fissure. It measures approximately 5.0 by 4.0 cm in greatest dimension. This is also decreased in size from prior exam measuring 5.5 x 5.1 cm. Left lower lobe nodule with spiculation is noted and also reduced in size now measuring approximately 19 mm in greatest dimension decreased from the prior exam measuring 26 mm in greatest dimension. The right lung is well aerated and demonstrates some dependent atelectatic changes. A tiny nodule is noted in the lateral aspect of the right upper lobe also decreased in size from the prior PET-CT. A second right lower lobe nodule is seen measuring approximately 9 mm. It demonstrates some tenting of the adjacent major fissure. It is slightly decreased in size from the prior exam at which time it measured 11 mm. Small left pleural effusion is noted. Musculoskeletal: No acute bony abnormality is noted. CT ABDOMEN PELVIS FINDINGS Hepatobiliary: Liver demonstrates some hypodensities adjacent to the falciform ligament consistent with focal fatty infiltration. The gallbladder is well distended. Pancreas: Within normal limits. Spleen: Within normal limits. Adrenals/Urinary Tract: The adrenal glands are unremarkable. The kidneys demonstrate renal cystic change on the right as well as a nonobstructing renal stone stable from the prior exam. The bladder is well distended without focal abnormality. Stomach/Bowel: Stomach is within normal limits. No evidence of bowel wall thickening, distention, or inflammatory changes. Appendix appears normal. Vascular/Lymphatic: Aortic atherosclerosis. The previously seen left pelvic lymphadenopathy in the iliac chain has increased in size from the prior exam measuring 3.7 x 2.6 cm. It previously measured 3 x 2 cm. Persistent soft tissue  changes are noted in the right inguinal region but slightly decreased when compared with the prior exam. No central necrotic component is noted. Stable left psoas mass is noted. Stable left periaortic lymph node is noted as well. Reproductive: Status post hysterectomy. No adnexal masses. Musculoskeletal: No acute bony abnormality is noted. IMPRESSION: Decrease in some of the pulmonary metastatic lesions as described above. The mass lesion abutting the mediastinum on the left has decreased in size from the prior exam. New mild pericardial effusion. Increase in size in a pelvic lymphadenopathy in the iliac chain. The left psoas mass and periaortic lymph node as well as changes in the right inguinal region are relatively stable as described. No new focal abnormality is seen. Electronically Signed   By: Inez Catalina M.D.   On: 09/23/2015 11:56   Mr Jeri Cos [id number] Contrast  Result Date: 09/29/2015 CLINICAL DATA:  Nausea and vomiting, chest pain for 1 day. History of metastatic endometrial and ovarian cancer, on radiation therapy. Followup probable LEFT cerebellar mass. EXAM: MRI HEAD WITHOUT AND WITH CONTRAST TECHNIQUE: Multiplanar, multiecho pulse sequences of the brain and surrounding structures were obtained without and with intravenous contrast. CONTRAST:  66mL MULTIHANCE GADOBENATE DIMEGLUMINE 529 MG/ML IV SOLN COMPARISON:  CT HEAD September 28, 2015 at 1010 hours. FINDINGS: BRAIN: T2 bright heterogeneously enhancing 2 x 2.3 x 2 cm (transverse by AP by CC) LEFT cerebellar mass corresponding to C2 abnormality. Faint susceptibility artifact along the margins. Surrounding T2 bright vasogenic edema with mild mass effect on the fourth ventricle which remains patent. No additional intraparenchymal mass. Supratentorial ventricles are normal. A few scattered sub cm supratentorial white matter FLAIR T2 hyperintensities compatible with mild  chronic small vessel ischemic disease, within normal range for patient's age. No  abnormal extra-axial fluid collections, extra-axial enhancement or extra-axial masses. VASCULAR: Normal major intracranial vascular flow voids present at skull base. SKULL AND UPPER CERVICAL SPINE: No abnormal sellar expansion. No suspicious calvarial bone marrow signal. Craniocervical junction maintained. Torus palatini. LEFT parietal exostosis without enhancement. SINUSES/ORBITS: The mastoid air-cells and included paranasal sinuses are well-aerated. The included ocular globes and orbital contents are non-suspicious. OTHER: None. 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 Vasogenic edema with mild mass effect on the fourth ventricle, no obstructive hydrocephalus. Otherwise negative MRI of the head for age. Electronically Signed   By: Elon Alas M.D.   On: 09/29/2015 03:52   Ct Abdomen Pelvis W Contrast  Result Date: 09/23/2015 CLINICAL DATA:  History of endometrial carcinoma with metastatic disease and chest pain EXAM: CT CHEST, ABDOMEN, AND PELVIS WITH CONTRAST TECHNIQUE: Multidetector CT imaging of the chest, abdomen and pelvis was performed following the standard protocol during bolus administration of intravenous contrast. CONTRAST:  14mL ISOVUE-300 IOPAMIDOL (ISOVUE-300) INJECTION 61% COMPARISON:  06/14/2015, 08/01/2015 FINDINGS: CT CHEST FINDINGS Cardiovascular: Bovine arch anomaly is noted. The thoracic aorta show some calcific changes without evidence of dissection or aneurysmal dilatation. The pulmonary artery as visualized is within normal limits. Timing was not optimum for pulmonary artery evaluation. Mild pericardial effusion is noted new from the prior exam. Mediastinum/Nodes: The thoracic inlet is within normal limits. There is again noted a 80 somewhat necrotic mass which abuts the mediastinum on the left. It measures approximately 7.2 x 6.6 cm in greatest transverse and AP dimensions respectively. This is a slight decrease when compared with the prior exam. Some  mediastinal adenopathy is noted in the aorticopulmonary window measuring 3.0 cm in short axis. This also demonstrates central decreased attenuation consistent with necrosis. Lungs/Pleura: The lungs are well aerated bilaterally and again demonstrate a large left upper lobe mass lesion which abuts the major fissure. It measures approximately 5.0 by 4.0 cm in greatest dimension. This is also decreased in size from prior exam measuring 5.5 x 5.1 cm. Left lower lobe nodule with spiculation is noted and also reduced in size now measuring approximately 19 mm in greatest dimension decreased from the prior exam measuring 26 mm in greatest dimension. The right lung is well aerated and demonstrates some dependent atelectatic changes. A tiny nodule is noted in the lateral aspect of the right upper lobe also decreased in size from the prior PET-CT. A second right lower lobe nodule is seen measuring approximately 9 mm. It demonstrates some tenting of the adjacent major fissure. It is slightly decreased in size from the prior exam at which time it measured 11 mm. Small left pleural effusion is noted. Musculoskeletal: No acute bony abnormality is noted. CT ABDOMEN PELVIS FINDINGS Hepatobiliary: Liver demonstrates some hypodensities adjacent to the falciform ligament consistent with focal fatty infiltration. The gallbladder is well distended. Pancreas: Within normal limits. Spleen: Within normal limits. Adrenals/Urinary Tract: The adrenal glands are unremarkable. The kidneys demonstrate renal cystic change on the right as well as a nonobstructing renal stone stable from the prior exam. The bladder is well distended without focal abnormality. Stomach/Bowel: Stomach is within normal limits. No evidence of bowel wall thickening, distention, or inflammatory changes. Appendix appears normal. Vascular/Lymphatic: Aortic atherosclerosis. The previously seen left pelvic lymphadenopathy in the iliac chain has increased in size from the prior  exam measuring 3.7 x 2.6 cm. It previously measured 3 x 2 cm.  Persistent soft tissue changes are noted in the right inguinal region but slightly decreased when compared with the prior exam. No central necrotic component is noted. Stable left psoas mass is noted. Stable left periaortic lymph node is noted as well. Reproductive: Status post hysterectomy. No adnexal masses. Musculoskeletal: No acute bony abnormality is noted. IMPRESSION: Decrease in some of the pulmonary metastatic lesions as described above. The mass lesion abutting the mediastinum on the left has decreased in size from the prior exam. New mild pericardial effusion. Increase in size in a pelvic lymphadenopathy in the iliac chain. The left psoas mass and periaortic lymph node as well as changes in the right inguinal region are relatively stable as described. No new focal abnormality is seen. Electronically Signed   By: Inez Catalina M.D.   On: 09/23/2015 11:56   Dg Abdomen Acute W/chest  Result Date: 09/12/2015 CLINICAL DATA:  Endometrial cancer with lung metastases EXAM: DG ABDOMEN ACUTE W/ 1V CHEST COMPARISON:  PET-CT dated 08/01/2015 FINDINGS: Left upper lobe masses, better evaluated on recent PET-CT. Mild blunting of the left costophrenic angle. Right lung is clear. No pneumothorax. Cardiomegaly. Right chest power port terminates in the lower SVC. Nonobstructive bowel gas pattern. No evidence of free air on the lateral decubitus view. Visualized osseous structures are within normal limits. IMPRESSION: Left upper lobe masses, better evaluated on recent PET-CT. No evidence of small bowel obstruction or free air. Electronically Signed   By: Julian Hy M.D.   On: 09/12/2015 17:01    Time Spent in minutes  25   Louellen Molder M.D on 10/08/2015 at 1:16 PM  Between 7am to 7pm - Pager - [phone]  After 7pm go to [email or web address] - password [facility]  [facility] -  Office  [phone]

## 2015-10-08 NOTE — Progress Notes (Signed)
Pt HR is sustaining in the upper 30's/lower 40's.  MD made aware.  Will continue to monitor closely.

## 2015-10-08 NOTE — Progress Notes (Signed)
CMT reported pt had 2 second pause with HR of 35. Admission strip showed 2nd degree type 2 HB. Pt asymptomatic. BP 184/76. NP on call notified. New order placed. Will continue to monitor.

## 2015-10-09 DIAGNOSIS — C541 Malignant neoplasm of endometrium: Secondary | ICD-10-CM

## 2015-10-09 DIAGNOSIS — I160 Hypertensive urgency: Principal | ICD-10-CM

## 2015-10-09 DIAGNOSIS — E871 Hypo-osmolality and hyponatremia: Secondary | ICD-10-CM | POA: Diagnosis present

## 2015-10-09 MED ORDER — AMLODIPINE BESYLATE 5 MG PO TABS
5.0000 mg | ORAL_TABLET | Freq: Every day | ORAL | Status: DC
  Administered 2015-10-09: 5 mg via ORAL
  Filled 2015-10-09: qty 1

## 2015-10-09 MED ORDER — CLONIDINE HCL 0.1 MG PO TABS: 0.1000 mg | ORAL_TABLET | Freq: Every day | ORAL | Status: DC

## 2015-10-09 MED ORDER — SODIUM CHLORIDE 0.9% FLUSH
10.0000 mL | INTRAVENOUS | Status: DC | PRN
  Administered 2015-10-09: 10 mL
  Filled 2015-10-09: qty 40

## 2015-10-09 MED ORDER — AMLODIPINE BESYLATE 5 MG PO TABS: 5.0000 mg | ORAL_TABLET | Freq: Two times a day (BID) | ORAL | 0 refills | Status: DC

## 2015-10-09 MED ORDER — AMLODIPINE BESYLATE 5 MG PO TABS: 5.0000 mg | ORAL_TABLET | Freq: Two times a day (BID) | ORAL | Status: DC

## 2015-10-09 MED ORDER — HEPARIN SOD (PORK) LOCK FLUSH 100 UNIT/ML IV SOLN
500.0000 [IU] | INTRAVENOUS | Status: AC | PRN
  Administered 2015-10-09: 500 [IU]

## 2015-10-09 MED ORDER — HYDROCHLOROTHIAZIDE 25 MG PO TABS: 12.5000 mg | ORAL_TABLET | Freq: Every day | ORAL | 0 refills | Status: DC

## 2015-10-09 MED ORDER — POTASSIUM CHLORIDE CRYS ER 10 MEQ PO TBCR: 10.0000 meq | EXTENDED_RELEASE_TABLET | Freq: Every day | ORAL | Status: DC

## 2015-10-09 MED ORDER — SODIUM CHLORIDE 0.9% FLUSH: 10.0000 mL | Freq: Two times a day (BID) | INTRAVENOUS | Status: DC

## 2015-10-09 MED ORDER — CLONIDINE HCL 0.1 MG PO TABS: 0.0500 mg | ORAL_TABLET | Freq: Every day | ORAL | Status: DC

## 2015-10-09 MED ORDER — HYDROCHLOROTHIAZIDE 12.5 MG PO CAPS: 12.5000 mg | ORAL_CAPSULE | Freq: Every day | ORAL | Status: DC

## 2015-10-09 NOTE — Consult Note (Signed)
Primary Physician: Primary Cardiologist:  ASked to see re bradycardia and hypertension  HPI:   Pt is a 60 yo with hstory of endometrial cancer with brain metastases; HTN, DVT  Yesterday found to be hpertensive and bradycardic Had been on labetalol for HTN and clonidine along with HCTZ for awhile  This was stopped  Neursurgery contacted; felt it did not represent Cushing's response   The pt denies CP   Breathing is OK  No dizziness Since admit HR has been in 40s  To 70s (SB)  No signif pauses  BP normal or high       Past Medical History:  Diagnosis Date  . Chemotherapy-induced neuropathy ([place])   . DVT, lower extremity ([place])    left  02-12-2015  currently on Eliqius  . Endometrial cancer [facility]) oncologist-  dr Marko Plume   02-2015  . 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 Family history of breast cancer   . History of adenomatous polyp of colon    tubulovillious adenoma 09/ 2010  . History of ovarian cyst    complex  . History of radiation therapy 10/21/08-11/23/08 & 12/07/10/13/12/23 2010   ENDOMETRIOID   . History of uterine fibroid   . Hypertension   . Inguinal fluid collection 02/2015   right-drained x2  . Iron deficiency anemia    chronic severe  . Metastasis to lung ([place])   . Metastasis to lymph nodes ([place])     Medications Prior to Admission  Medication Sig Dispense Refill  . apixaban (ELIQUIS) 5 MG TABS tablet Take 1 tablet (5 mg total) by mouth 2 (two) times daily. 60 tablet 0  . cloNIDine (CATAPRES) 0.1 MG tablet Take 0.1 mg by mouth at bedtime.     Marland Kitchen dexamethasone (DECADRON) 4 MG tablet Take 1 tablet (4 mg total) by mouth 3 (three) times daily. 60 tablet 0  . hydrochlorothiazide (HYDRODIURIL) 25 MG tablet Take 25 mg by mouth daily.      Marland Kitchen HYDROmorphone (DILAUDID) 2 MG tablet TAKE 1 TO 2 TABLETS BY MOUTH EVERY 4 HOURS AS NEEDED FOR SEVERE PAIN  0  . labetalol (NORMODYNE) 100 MG tablet Take 2 tablets (200 mg total) by mouth 3 (three) times daily. 180  tablet 2  . letrozole (FEMARA) 2.5 MG tablet Take 1 tablet (2.5 mg total) by mouth daily. 30 tablet 0  . levETIRAcetam (KEPPRA) 500 MG tablet Take 1 tablet (500 mg total) by mouth 2 (two) times daily. 60 tablet 0  . LORazepam (ATIVAN) 0.5 MG tablet Take one tab po 30 minutes prior to radiation or MRI. 30 tablet 0  . pantoprazole (PROTONIX) 40 MG tablet Take 1 tablet (40 mg total) by mouth daily. 30 tablet 2  . Pediatric Multivit-Minerals-C (FLINTSTONES GUMMIES PO) Take 1 tablet by mouth daily.    . polyethylene glycol (MIRALAX / GLYCOLAX) packet Take 17 g by mouth 2 (two) times daily as needed for mild constipation. Reported on 05/23/2015    . potassium chloride (K-DUR) 10 MEQ tablet Take 2 tablets today and then 1 tablet daily (Patient taking differently: Take 20 mEq by mouth daily. ) 30 tablet 1  . senna-docusate (SENOKOT-S) 8.6-50 MG tablet Take 1 tablet by mouth 2 (two) times daily.    Marland Kitchen zolpidem (AMBIEN) 5 MG tablet Take 1 tablet (5 mg total) by mouth at bedtime as needed for sleep. 30 tablet 2  . chlorpheniramine-HYDROcodone (TUSSIONEX) 10-8 MG/5ML SUER Take 5 ml by mouth every 8-12 hours as  needed for cough. (Patient not taking: Reported on 10/07/2015) 473 mL 0  . feeding supplement, ENSURE ENLIVE, (ENSURE ENLIVE) LIQD Take 237 mLs by mouth 2 (two) times daily between meals. 237 mL 12  . lidocaine-prilocaine (EMLA) cream Apply to Porta-cath 1-2 hrs prior to access as directed. (Patient taking differently: Apply 1 application topically as needed (Apply to port-a-cath 1-2 hours prior to access.). ) 30 g 1  . morphine (MSIR) 15 MG tablet Take 1 tablet (15 mg total) by mouth every 6 (six) hours as needed for moderate pain or severe pain. 30 tablet 0  . Wound Dressings (SONAFINE EX) Apply 1 application topically as needed (For wound dressing.).        Marland Kitchen amLODipine  5 mg Oral Daily  . apixaban  5 mg Oral BID  . dexamethasone  4 mg Oral TID  . feeding supplement (ENSURE ENLIVE)  237 mL Oral BID BM   . hydrALAZINE  25 mg Oral Q8H  . hydrochlorothiazide  25 mg Oral Daily  . letrozole  2.5 mg Oral Daily  . levETIRAcetam  500 mg Oral BID  . pantoprazole  40 mg Oral Daily  . potassium chloride  20 mEq Oral Daily  . senna-docusate  1 tablet Oral BID    Infusions:    Allergies  Allergen Reactions  . Morphine And Related Itching and Other (See Comments)    Pruritis with morphine - depends on strength of medication  . Dilaudid [Hydromorphone Hcl] Itching and Nausea And Vomiting    Social History   Social History  . Marital status: Married    Spouse name: N/A  . Number of children: 3  . Years of education: N/A   Occupational History  . Not on file.   Social History Main Topics  . Smoking status: Never Smoker  . Smokeless tobacco: Never Used  . Alcohol use No  . Drug use: No  . Sexual activity: Not on file   Other Topics Concern  . Not on file   Social History Narrative  . No narrative on file    Family History  Problem Relation Age of Onset  . Bone cancer Paternal Grandmother     OSSEOUS METASTASIS  . Heart disease Father   . Heart disease Sister   . Bone cancer Maternal Grandmother 75  . Leukemia Maternal Grandfather 57  . Liver cancer Maternal Uncle   . Breast cancer Cousin 19    maternal first cousin    REVIEW OF SYSTEMS:  All systems reviewed  Negative to the above problem except as noted above.    PHYSICAL EXAM: Vitals:   10/08/15 2131 10/09/15 0559  BP: (!) 172/54 (!) 151/65  Pulse: 88 (!) 44  Resp: 18 18  Temp: 98.8 F (37.1 C) 98.4 F (36.9 C)     Intake/Output Summary (Last 24 hours) at 10/09/15 1422 Last data filed at 10/09/15 0634  Gross per 24 hour  Intake              600 ml  Output                0 ml  Net              600 ml    General: Obese 60 yo in NAD   No respiratory difficulty HEENT: normal Neck: supple. no JVD. Carotids 2+ bilat; no bruits. No lymphadenopathy or thryomegaly appreciated. Cor: PMI nondisplaced. Regular  rate & rhythm. No rubs, gallops or murmurs. Lungs:  clear Abdomen: soft, nontender, nondistended. No hepatosplenomegaly. No bruits or masses. Good bowel sounds. Extremities: no cyanosis, clubbing, rash, edema Neuro: alert & oriented x 3, cranial nerves grossly intact. moves all 4 extremities w/o difficulty. Affect pleasant.  ECG:  Sinus bradycardia  43 bpm  PR interval normal  RBBB.  LAFB    No results found for this or any previous visit (from the past 24 hour(s)). Ct Head Wo Contrast  Result Date: 10/07/2015 CLINICAL DATA:  Symptomatic bradycardia to the 40s. Left cerebellar mass seen on recent MRI of 09/28/2015. EXAM: CT HEAD WITHOUT CONTRAST TECHNIQUE: Contiguous axial images were obtained from the base of the skull through the vertex without intravenous contrast. COMPARISON:  CT and MRI from 09/28/2015 FINDINGS: Brain: Left cerebellar 1.5 x 1.6 x 1.8 cm intra-axial mass with surrounding vasogenic edema is again seen with slight decrease in mass effect on the adjacent fourth ventricle. Edema does cross midline as before. No hemorrhage,, acute infarction or extra-axial fluid collections. Vascular: Minimal calcific atherosclerosis of the skull base. Skull: Probable old calcified cephalohematoma along the left posterior parietal skull. No acute fracture nor bone destruction. Sinuses/Orbits: Normal Other: None IMPRESSION: Known left cerebellar metastasis with vasogenic edema. Subjectively less mass effect on the adjacent fourth ventricle, albeit minimal. Electronically Signed   By: Ashley Royalty M.D.   On: 10/07/2015 19:18     ASSESSMENT:  Bradycardia and hypertension  I have reviewed records Pt has been on same medciines for awhile  EKG in May with SR RBBB LAFB  PR interval was normal  Now HR is lower; RBBB, LAFB unchanged .  Patient has been hypertensive   Denies dizziness  No CP    She has been taken off of labetalol and clonidine  These will take a little bit to wash out  She may indeed be  experiencing some rebound in BP Review of telemetry shows no signif pauses    Recomm: I would recomm amlodipine 5 mg bid Cut back on HCTZ to 12.5 mg with 10 KCL WIll arrange for follow up BP check in clnic  HR will be evaluated  May end up getting a holter monitor but if done, only when b blocker and clonidine have washed out She has a monitor at home and I told her to use it I have also spoken with daughter who is a physician to tell her of plan   OK to d/c  Home  Again, will call pt for follow up    2  Anemia  Chronic  Rel stable

## 2015-10-09 NOTE — Discharge Summary (Signed)
Physician Discharge Summary  UMU SCHILLO [id number] DOB: 07-Apr-1955 DOA: 10/07/2015  PCP: [place] [facility] date: 10/07/2015 Discharge date: 10/09/2015  Admitted From: home Disposition:  home  Recommendations for Outpatient Follow-up:  1. Follow up with PCP in 1-2 weeks 2. Needs close outpatient monitoring for BP 3.  cardiology will arrange outpt follow up in 1 week.  Home Health:none Equipment/Devices:none  Discharge Condition: stable CODE STATUS: full code Diet recommendation: 2 gm sodium    Discharge Diagnoses:  Principal Problem:   Hypertensive urgency  Active Problems: Sinus bradycardia   Uterine cancer (HCC)   Leg DVT (deep venous thromboembolism), chronic, left (HCC)   Brain metastases (HCC)   Hyponatremia  Brief narrative/ HPI 60 y/o female with endometrial ca with brain mets, currently on radiation, HTN, Hx of DVT on eliquis, was seen for her radiation therapy and found to have hypertensive urgency and bradycardic to 40s. Placed on observation.   Hospital course  Principal Problem: Sinus bradycardia Asymptomatic. Suspected due to labetalol which was discontinued. Heart rate as low as 34 on the monitor.  Seen by cardiology. No intervention needed. recommend to d/c labetalol and clonidine.       Hypertensive urgency Reduced HCTZ dose. added amlodipine 5 mg bid.  Discontinue labetalol and clonidine. Needs closed follow uip for BP monitoring as outpt. Cardiology will arrange outpt follow up.  Active Problems:   Leg DVT (deep venous thromboembolism), chronic, left (HCC) On eliquis    Endometrial carcinoma with Brain metastases ([place]) Getting daily radiation as outpatient. Continue Decadron and letrozole. continue home pain meds     Code Status : Full code  Family Communication  : Daughter at bedside  Disposition Plan  : Home   Consults  :  Cardiology ( Dr Harrington Challenger)  Procedures  : None  Discharge  Instructions     Medication List    STOP taking these medications   chlorpheniramine-HYDROcodone 10-8 MG/5ML Suer Commonly known as:  TUSSIONEX   labetalol 100 MG tablet Commonly known as:  NORMODYNE  CATAPRES 0.1 MG tablet Generic drug:  cloNIDine Take 0.1 mg by mouth at bedtime.     TAKE these medications   amLODipine 5 MG tablet Commonly known as:  NORVASC Take 1 tablet (5 mg total) by mouth 2 (two) times daily.   apixaban 5 MG Tabs tablet Commonly known as:  ELIQUIS Take 1 tablet (5 mg total) by mouth 2 (two) times daily.     dexamethasone 4 MG tablet Commonly known as:  DECADRON Take 1 tablet (4 mg total) by mouth 3 (three) times daily.   feeding supplement (ENSURE ENLIVE) Liqd Take 237 mLs by mouth 2 (two) times daily between meals.   FLINTSTONES GUMMIES PO Take 1 tablet by mouth daily.   hydrochlorothiazide 25 MG tablet Commonly known as:  HYDRODIURIL Take 0.5 tablets (12.5 mg total) by mouth daily. What changed:  how much to take   HYDROmorphone 2 MG tablet Commonly known as:  DILAUDID TAKE 1 TO 2 TABLETS BY MOUTH EVERY 4 HOURS AS NEEDED FOR SEVERE PAIN   letrozole 2.5 MG tablet Commonly known as:  FEMARA Take 1 tablet (2.5 mg total) by mouth daily.   levETIRAcetam 500 MG tablet Commonly known as:  KEPPRA Take 1 tablet (500 mg total) by mouth 2 (two) times daily.   lidocaine-prilocaine cream Commonly known as:  EMLA Apply to Porta-cath 1-2 hrs prior to access as directed. What changed:  how much to take  how to take this  when to take this  reasons to take this  additional instructions   LORazepam 0.5 MG tablet Commonly known as:  ATIVAN Take one tab po 30 minutes prior to radiation or MRI.   morphine 15 MG tablet Commonly known as:  MSIR Take 1 tablet (15 mg total) by mouth every 6 (six) hours as needed for moderate pain or severe pain.   pantoprazole 40 MG tablet Commonly known as:  PROTONIX Take 1 tablet (40 mg total) by mouth  daily.   polyethylene glycol packet Commonly known as:  MIRALAX / GLYCOLAX Take 17 g by mouth 2 (two) times daily as needed for mild constipation. Reported on 05/23/2015   potassium chloride 10 MEQ tablet Commonly known as:  K-DUR Take 2 tablets today and then 1 tablet daily What changed:  how much to take  how to take this  when to take this  additional instructions   senna-docusate 8.6-50 MG tablet Commonly known as:  Senokot-S Take 1 tablet by mouth 2 (two) times daily.   SONAFINE EX Apply 1 application topically as needed (For wound dressing.).   zolpidem 5 MG tablet Commonly known as:  AMBIEN Take 1 tablet (5 mg total) by mouth at bedtime as needed for sleep.      Follow-up Information    [facility]. Schedule an appointment as soon as possible for a visit in 1 week(s).   Contact information: [place] [address] [phone]        Dorris Carnes, MD Follow up in 1 week(s).   Specialty:  Cardiology Why:  Office will arrange appt Contact information: [address] [place] [address] [phone]          Allergies  Allergen Reactions  . Morphine And Related Itching and Other (See Comments)    Pruritis with morphine - depends on strength of medication  . Dilaudid [Hydromorphone Hcl] Itching and Nausea And Vomiting        Procedures/Studies: Dg Chest 2 View  Result Date: 09/23/2015 CLINICAL DATA:  Mid chest pain hand shortness of breath since 6 a.m. today. EXAM: CHEST  2 VIEW COMPARISON:  09/12/2015 FINDINGS: Left upper lobe mass again noted not significantly changed. Right Port-A-Cath is unchanged. Mild cardiomegaly. No confluent opacity on the right. No effusions or acute bony abnormality. IMPRESSION: Stable left upper lobe mass. Mild cardiomegaly. Electronically Signed   By: Rolm Baptise M.D.   On: 09/23/2015 10:56   Ct Head Wo Contrast  Result Date: 10/07/2015 CLINICAL DATA:   Symptomatic bradycardia to the 40s. Left cerebellar mass seen on recent MRI of 09/28/2015. EXAM: CT HEAD WITHOUT CONTRAST TECHNIQUE: Contiguous axial images were obtained from the base of the skull through the vertex without intravenous contrast. COMPARISON:  CT and MRI from 09/28/2015 FINDINGS: Brain: Left cerebellar 1.5 x 1.6 x 1.8 cm intra-axial mass with surrounding vasogenic edema is again seen with slight decrease in mass effect on the adjacent fourth ventricle. Edema does cross midline as before. No hemorrhage,, acute infarction or extra-axial fluid collections. Vascular: Minimal calcific atherosclerosis of the skull base. Skull: Probable old calcified cephalohematoma along the left posterior parietal skull. No acute fracture nor bone destruction. Sinuses/Orbits: Normal Other: None IMPRESSION: Known left cerebellar metastasis with vasogenic edema. Subjectively less mass effect on the adjacent fourth ventricle, albeit minimal. Electronically Signed   By: Ashley Royalty M.D.   On: 10/07/2015 19:18   Ct Head Wo  Contrast  Result Date: 09/28/2015 CLINICAL DATA:  60 year old female status post fall in the middle of the night. On Coumadin. Personal history of endometrial carcinoma, and large left lung mass in July. Initial encounter. EXAM: CT HEAD WITHOUT CONTRAST TECHNIQUE: Contiguous axial images were obtained from the base of the skull through the vertex without intravenous contrast. COMPARISON:  PET-CT 08/01/2015. FINDINGS: Brain: Posterior fossa mass effect and abnormal hypodensity in the central and left cerebellar hemisphere encompassing an area of about 3.5 cm (series 2, image 8). Mass effect on the fourth ventricle, but no ventriculomegaly. Crowding of the pre pontine cistern which remains patent. Edema mildly crosses midline to the central right cerebellum. Supratentorial gray-white matter differentiation appears normal. No supratentorial mass effect. Partially empty sella incidentally noted. No acute  intracranial hemorrhage identified. No cortically based acute infarct identified. Vascular: Mild Calcified atherosclerosis at the skull base. Skull: left posterior skull exostosis incidentally noted. No acute or suspicious osseous lesion identified. Sinuses/Orbits: Clear. Other: Negative orbit and scalp soft tissues. IMPRESSION: 1. Cerebellar edema centrally on the left with suggestion of an underlying 3.5 cm left cerebellar mass. Mass effect on the fourth ventricle but no ventriculomegaly or loss of basilar cistern patency. 2. Cerebellar metastatic disease favored in this clinical setting. Follow-up Brain MRI without and with contrast would characterize further. 3. No other acute or metastatic intracranial abnormality identified. Electronically Signed   By: Genevie Ann M.D.   On: 09/28/2015 10:35   Ct Chest W Contrast  Result Date: 09/23/2015 CLINICAL DATA:  History of endometrial carcinoma with metastatic disease and chest pain EXAM: CT CHEST, ABDOMEN, AND PELVIS WITH CONTRAST TECHNIQUE: Multidetector CT imaging of the chest, abdomen and pelvis was performed following the standard protocol during bolus administration of intravenous contrast. CONTRAST:  179mL ISOVUE-300 IOPAMIDOL (ISOVUE-300) INJECTION 61% COMPARISON:  06/14/2015, 08/01/2015 FINDINGS: CT CHEST FINDINGS Cardiovascular: Bovine arch anomaly is noted. The thoracic aorta show some calcific changes without evidence of dissection or aneurysmal dilatation. The pulmonary artery as visualized is within normal limits. Timing was not optimum for pulmonary artery evaluation. Mild pericardial effusion is noted new from the prior exam. Mediastinum/Nodes: The thoracic inlet is within normal limits. There is again noted a 80 somewhat necrotic mass which abuts the mediastinum on the left. It measures approximately 7.2 x 6.6 cm in greatest transverse and AP dimensions respectively. This is a slight decrease when compared with the prior exam. Some mediastinal  adenopathy is noted in the aorticopulmonary window measuring 3.0 cm in short axis. This also demonstrates central decreased attenuation consistent with necrosis. Lungs/Pleura: The lungs are well aerated bilaterally and again demonstrate a large left upper lobe mass lesion which abuts the major fissure. It measures approximately 5.0 by 4.0 cm in greatest dimension. This is also decreased in size from prior exam measuring 5.5 x 5.1 cm. Left lower lobe nodule with spiculation is noted and also reduced in size now measuring approximately 19 mm in greatest dimension decreased from the prior exam measuring 26 mm in greatest dimension. The right lung is well aerated and demonstrates some dependent atelectatic changes. A tiny nodule is noted in the lateral aspect of the right upper lobe also decreased in size from the prior PET-CT. A second right lower lobe nodule is seen measuring approximately 9 mm. It demonstrates some tenting of the adjacent major fissure. It is slightly decreased in size from the prior exam at which time it measured 11 mm. Small left pleural effusion is noted. Musculoskeletal: No acute bony abnormality  is noted. CT ABDOMEN PELVIS FINDINGS Hepatobiliary: Liver demonstrates some hypodensities adjacent to the falciform ligament consistent with focal fatty infiltration. The gallbladder is well distended. Pancreas: Within normal limits. Spleen: Within normal limits. Adrenals/Urinary Tract: The adrenal glands are unremarkable. The kidneys demonstrate renal cystic change on the right as well as a nonobstructing renal stone stable from the prior exam. The bladder is well distended without focal abnormality. Stomach/Bowel: Stomach is within normal limits. No evidence of bowel wall thickening, distention, or inflammatory changes. Appendix appears normal. Vascular/Lymphatic: Aortic atherosclerosis. The previously seen left pelvic lymphadenopathy in the iliac chain has increased in size from the prior exam  measuring 3.7 x 2.6 cm. It previously measured 3 x 2 cm. Persistent soft tissue changes are noted in the right inguinal region but slightly decreased when compared with the prior exam. No central necrotic component is noted. Stable left psoas mass is noted. Stable left periaortic lymph node is noted as well. Reproductive: Status post hysterectomy. No adnexal masses. Musculoskeletal: No acute bony abnormality is noted. IMPRESSION: Decrease in some of the pulmonary metastatic lesions as described above. The mass lesion abutting the mediastinum on the left has decreased in size from the prior exam. New mild pericardial effusion. Increase in size in a pelvic lymphadenopathy in the iliac chain. The left psoas mass and periaortic lymph node as well as changes in the right inguinal region are relatively stable as described. No new focal abnormality is seen. Electronically Signed   By: Inez Catalina M.D.   On: 09/23/2015 11:56   Mr Jeri Cos [id number] Contrast  Result Date: 09/29/2015 CLINICAL DATA:  Nausea and vomiting, chest pain for 1 day. History of metastatic endometrial and ovarian cancer, on radiation therapy. Followup probable LEFT cerebellar mass. EXAM: MRI HEAD WITHOUT AND WITH CONTRAST TECHNIQUE: Multiplanar, multiecho pulse sequences of the brain and surrounding structures were obtained without and with intravenous contrast. CONTRAST:  30mL MULTIHANCE GADOBENATE DIMEGLUMINE 529 MG/ML IV SOLN COMPARISON:  CT HEAD September 28, 2015 at 1010 hours. FINDINGS: BRAIN: T2 bright heterogeneously enhancing 2 x 2.3 x 2 cm (transverse by AP by CC) LEFT cerebellar mass corresponding to C2 abnormality. Faint susceptibility artifact along the margins. Surrounding T2 bright vasogenic edema with mild mass effect on the fourth ventricle which remains patent. No additional intraparenchymal mass. Supratentorial ventricles are normal. A few scattered sub cm supratentorial white matter FLAIR T2 hyperintensities compatible with mild  chronic small vessel ischemic disease, within normal range for patient's age. No abnormal extra-axial fluid collections, extra-axial enhancement or extra-axial masses. VASCULAR: Normal major intracranial vascular flow voids present at skull base. SKULL AND UPPER CERVICAL SPINE: No abnormal sellar expansion. No suspicious calvarial bone marrow signal. Craniocervical junction maintained. Torus palatini. LEFT parietal exostosis without enhancement. SINUSES/ORBITS: The mastoid air-cells and included paranasal sinuses are well-aerated. The included ocular globes and orbital contents are non-suspicious. OTHER: None. 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 Vasogenic edema with mild mass effect on the fourth ventricle, no obstructive hydrocephalus. Otherwise negative MRI of the head for age. Electronically Signed   By: Elon Alas M.D.   On: 09/29/2015 03:52   Ct Abdomen Pelvis W Contrast  Result Date: 09/23/2015 CLINICAL DATA:  History of endometrial carcinoma with metastatic disease and chest pain EXAM: CT CHEST, ABDOMEN, AND PELVIS WITH CONTRAST TECHNIQUE: Multidetector CT imaging of the chest, abdomen and pelvis was performed following the standard protocol during bolus administration of intravenous contrast. CONTRAST:  16mL ISOVUE-300 IOPAMIDOL (ISOVUE-300) INJECTION  61% COMPARISON:  06/14/2015, 08/01/2015 FINDINGS: CT CHEST FINDINGS Cardiovascular: Bovine arch anomaly is noted. The thoracic aorta show some calcific changes without evidence of dissection or aneurysmal dilatation. The pulmonary artery as visualized is within normal limits. Timing was not optimum for pulmonary artery evaluation. Mild pericardial effusion is noted new from the prior exam. Mediastinum/Nodes: The thoracic inlet is within normal limits. There is again noted a 80 somewhat necrotic mass which abuts the mediastinum on the left. It measures approximately 7.2 x 6.6 cm in greatest transverse and AP dimensions  respectively. This is a slight decrease when compared with the prior exam. Some mediastinal adenopathy is noted in the aorticopulmonary window measuring 3.0 cm in short axis. This also demonstrates central decreased attenuation consistent with necrosis. Lungs/Pleura: The lungs are well aerated bilaterally and again demonstrate a large left upper lobe mass lesion which abuts the major fissure. It measures approximately 5.0 by 4.0 cm in greatest dimension. This is also decreased in size from prior exam measuring 5.5 x 5.1 cm. Left lower lobe nodule with spiculation is noted and also reduced in size now measuring approximately 19 mm in greatest dimension decreased from the prior exam measuring 26 mm in greatest dimension. The right lung is well aerated and demonstrates some dependent atelectatic changes. A tiny nodule is noted in the lateral aspect of the right upper lobe also decreased in size from the prior PET-CT. A second right lower lobe nodule is seen measuring approximately 9 mm. It demonstrates some tenting of the adjacent major fissure. It is slightly decreased in size from the prior exam at which time it measured 11 mm. Small left pleural effusion is noted. Musculoskeletal: No acute bony abnormality is noted. CT ABDOMEN PELVIS FINDINGS Hepatobiliary: Liver demonstrates some hypodensities adjacent to the falciform ligament consistent with focal fatty infiltration. The gallbladder is well distended. Pancreas: Within normal limits. Spleen: Within normal limits. Adrenals/Urinary Tract: The adrenal glands are unremarkable. The kidneys demonstrate renal cystic change on the right as well as a nonobstructing renal stone stable from the prior exam. The bladder is well distended without focal abnormality. Stomach/Bowel: Stomach is within normal limits. No evidence of bowel wall thickening, distention, or inflammatory changes. Appendix appears normal. Vascular/Lymphatic: Aortic atherosclerosis. The previously seen left  pelvic lymphadenopathy in the iliac chain has increased in size from the prior exam measuring 3.7 x 2.6 cm. It previously measured 3 x 2 cm. Persistent soft tissue changes are noted in the right inguinal region but slightly decreased when compared with the prior exam. No central necrotic component is noted. Stable left psoas mass is noted. Stable left periaortic lymph node is noted as well. Reproductive: Status post hysterectomy. No adnexal masses. Musculoskeletal: No acute bony abnormality is noted. IMPRESSION: Decrease in some of the pulmonary metastatic lesions as described above. The mass lesion abutting the mediastinum on the left has decreased in size from the prior exam. New mild pericardial effusion. Increase in size in a pelvic lymphadenopathy in the iliac chain. The left psoas mass and periaortic lymph node as well as changes in the right inguinal region are relatively stable as described. No new focal abnormality is seen. Electronically Signed   By: Inez Catalina M.D.   On: 09/23/2015 11:56   Dg Abdomen Acute W/chest  Result Date: 09/12/2015 CLINICAL DATA:  Endometrial cancer with lung metastases EXAM: DG ABDOMEN ACUTE W/ 1V CHEST COMPARISON:  PET-CT dated 08/01/2015 FINDINGS: Left upper lobe masses, better evaluated on recent PET-CT. Mild blunting of the left  costophrenic angle. Right lung is clear. No pneumothorax. Cardiomegaly. Right chest power port terminates in the lower SVC. Nonobstructive bowel gas pattern. No evidence of free air on the lateral decubitus view. Visualized osseous structures are within normal limits. IMPRESSION: Left upper lobe masses, better evaluated on recent PET-CT. No evidence of small bowel obstruction or free air. Electronically Signed   By: Julian Hy M.D.   On: 09/12/2015 17:01     Subjective: Noted for a very short episode of aflutter on monitor overnight. denies any symptoms.  Discharge Exam: Vitals:   10/09/15 0559 10/09/15 1438  BP: (!) 151/65 (!)  173/62  Pulse: (!) 44 (!) 45  Resp: 18 18  Temp: 98.4 F (36.9 C) 98.1 F (36.7 C)   Vitals:   10/08/15 1500 10/08/15 2131 10/09/15 0559 10/09/15 1438  BP: (!) 136/50 (!) 172/54 (!) 151/65 (!) 173/62  Pulse: (!) 50 88 (!) 44 (!) 45  Resp: 18 18 18 18   Temp: 98.2 F (36.8 C) 98.8 F (37.1 C) 98.4 F (36.9 C) 98.1 F (36.7 C)  TempSrc: Oral Oral Oral Oral  SpO2: 98% 96% 100% 100%  Weight:      Height:         Gen: not in distress HEENT:  moist mucosa, supple neck Chest: clear b/l, no added sounds CVS: S1 and S2 Bradycardic, no murmurs rub or gallop GI: soft, NT, ND,  Musculoskeletal: warm, no edema     The results of significant diagnostics from this hospitalization (including imaging, microbiology, ancillary and laboratory) are listed below for reference.     Microbiology: No results found for this or any previous visit (from the past 240 hour(s)).   Labs: BNP (last 3 results) No results for input(s): BNP in the last 8760 hours. Basic Metabolic Panel:  Recent Labs Lab 10/07/15 1515  NA 130*  K 4.0  CO2 26  GLUCOSE 137  BUN 25.7  CREATININE 0.7  CALCIUM 8.8   Liver Function Tests:  Recent Labs Lab 10/07/15 1515  AST 12  ALT 16  ALKPHOS 101  BILITOT 0.87  PROT 6.2*  ALBUMIN 2.5*   No results for input(s): LIPASE, AMYLASE in the last 168 hours. No results for input(s): AMMONIA in the last 168 hours. CBC:  Recent Labs Lab 10/07/15 1515  WBC 14.6*  NEUTROABS 13.6*  HGB 11.4*  HCT 33.8*  MCV 74.9*  PLT 483*   Cardiac Enzymes: No results for input(s): CKTOTAL, CKMB, CKMBINDEX, TROPONINI in the last 168 hours. BNP: Invalid input(s): POCBNP CBG: No results for input(s): GLUCAP in the last 168 hours. D-Dimer No results for input(s): DDIMER in the last 72 hours. Hgb A1c No results for input(s): HGBA1C in the last 72 hours. Lipid Profile No results for input(s): CHOL, HDL, LDLCALC, TRIG, CHOLHDL, LDLDIRECT in the last 72  hours. Thyroid function studies No results for input(s): TSH, T4TOTAL, T3FREE, THYROIDAB in the last 72 hours.  Invalid input(s): FREET3 Anemia work up No results for input(s): VITAMINB12, FOLATE, FERRITIN, TIBC, IRON, RETICCTPCT in the last 72 hours. Urinalysis    Component Value Date/Time   COLORURINE AMBER (A) 09/25/2015 0210   APPEARANCEUR CLOUDY (A) 09/25/2015 0210   LABSPEC 1.028 09/25/2015 0210   PHURINE 5.0 09/25/2015 0210   GLUCOSEU NEGATIVE 09/25/2015 0210   HGBUR NEGATIVE 09/25/2015 0210   BILIRUBINUR SMALL (A) 09/25/2015 0210   KETONESUR NEGATIVE 09/25/2015 0210   PROTEINUR NEGATIVE 09/25/2015 0210   UROBILINOGEN 0.2 03/03/2008 1216   NITRITE NEGATIVE 09/25/2015 0210  LEUKOCYTESUR SMALL (A) 09/25/2015 0210   Sepsis Labs Invalid input(s): PROCALCITONIN,  WBC,  LACTICIDVEN Microbiology No results found for this or any previous visit (from the past 240 hour(s)).   Time coordinating discharge: Over 30 minutes  SIGNED:   Louellen Molder, MD  [facility] 10/09/2015, 4:59 PM Pager   If 7PM-7AM, please contact night-coverage [email or web address] Password [email or web address]

## 2015-10-09 NOTE — Progress Notes (Signed)
Initial Nutrition Assessment  DOCUMENTATION CODES:   Severe malnutrition in context of chronic illness  INTERVENTION:  Discontinued Ensure Enlive per patient request.  Boost Plus po BID between meals, each supplement provides 360 kcal, 14 grams protein.  Continue small, frequent meals (6 meals/day) with adequate protein and calories.  NUTRITION DIAGNOSIS:   Malnutrition related to chronic illness as evidenced by 15 percent weight loss in 4 months, energy intake < or equal to 75% for > or equal to 1 month.  GOAL:   Patient will meet greater than or equal to 90% of their needs  MONITOR:   PO intake, Supplement acceptance, I & O's, Labs, Weight trends  REASON FOR ASSESSMENT:   Malnutrition Screening Tool    ASSESSMENT:   60 y.o. female with medical history significant of endometrial cancer with brain met, HTN.  Patient presents to the ED with HTN and bradycardia.  Patient reports she lost a significant amount of weight earlier this year after starting treatment in April. She does report that her appetite has been much better lately and she has been eating small, frequent meals (6x/day) to make sure she gets enough calories and protein. She likes Ensure Plus better than Ensure Enlive. Wants to try Boost Plus instead of Ensure Enlive.   UBW 174 lbs. Patient lost 26 lb from May 2017 to September 2017 (15% wt loss x 4 months, significant for time frame). She has been gaining weight back slowly.  Meal Completion: 15-100%  Medications reviewed and include: dexamethasone 4 mg TID, pantoprazole, potassium chloride 20 mEq daily, senna.  Labs reviewed: Sodium 130, Chloride 95.   Nutrition-Focused physical exam completed. Findings are no fat depletion, no muscle depletion, and no edema.  Discussed plan with RN. Patient likely to discharge tomorrow.   Diet Order:  Diet Heart Room service appropriate? Yes; Fluid consistency: Thin  Skin:  Reviewed, no issues  Last BM:   10/08/2015  Height:   Ht Readings from Last 1 Encounters:  10/07/15 5' 2"  (1.575 m)    Weight:   Wt Readings from Last 1 Encounters:  10/07/15 160 lb 7.9 oz (72.8 kg)    Ideal Body Weight:  52.3 kg  BMI:  Body mass index is 29.35 kg/m.  Estimated Nutritional Needs:   Kcal:  1900-2200  Protein:  95-110 grams  Fluid:  2.2 L/day  EDUCATION NEEDS:   No education needs identified at this time  Willey Blade, MS, RD, LDN Pager: [phone] After Hours Pager: [phone]

## 2015-10-09 NOTE — Progress Notes (Signed)
Went over all discharge paperwork with patient and family.  All questions answered.  Explained importance of going to follow up appointment and taking medications as prescribed.  Discharge summary and prescription given to patient.  Pt discharge via wheelchair.

## 2015-10-10 ENCOUNTER — Encounter: Payer: Self-pay | Admitting: Oncology

## 2015-10-10 NOTE — Progress Notes (Signed)
Medical Oncology  Message to scheduling:  LL + lab from [facility] or peripheral whichever she prefers, 10-12 at 3:00 if still open, or other in next ~ 2 weeks  L.Marko Plume, MD

## 2015-10-12 ENCOUNTER — Ambulatory Visit (INDEPENDENT_AMBULATORY_CARE_PROVIDER_SITE_OTHER): Payer: Medicaid Other | Admitting: Cardiology

## 2015-10-12 ENCOUNTER — Encounter: Payer: Self-pay | Admitting: Cardiology

## 2015-10-12 ENCOUNTER — Other Ambulatory Visit: Payer: Self-pay | Admitting: Oncology

## 2015-10-12 VITALS — BP 138/78 | HR 84 | Ht 62.0 in | Wt 153.0 lb

## 2015-10-12 DIAGNOSIS — I10 Essential (primary) hypertension: Secondary | ICD-10-CM

## 2015-10-12 MED ORDER — AMLODIPINE BESYLATE 5 MG PO TABS: 5.0000 mg | ORAL_TABLET | Freq: Two times a day (BID) | ORAL | 3 refills | Status: DC

## 2015-10-12 NOTE — Progress Notes (Signed)
10/12/2015 [name]   Jun 20, 1955  [id number]  Primary Physician Evlyn Clines, MD Primary Cardiologist: Dr. Harrington Challenger   Reason for Visit/CC: [facility] F/u for Bradycardia and HTN  HPI:  Pt is a 60 y/o AAF with hstory of endometrial cancer with brain metastases; HTN, and DVT. She is on anticoagulation therapy with Eliquis. She recently was admitted to the hospital for hypertension and bradycardia. She was noted to have abnormal vital signs during routien radiation therapy appt. She had been on labetalol, clonidine and HCTZ for HTN. BP was high. HR was in the 40s to 70s. No significant pauses. Cardiology was consulted and she was seen by Dr. Harrington Challenger. She denied CP. No dyspnea nor dizziness. Dr. Harrington Challenger recommended discontinuation of labetalol and cloninide. Her HCTZ was cut back to 12.5 mg daily with 10 MEq of Kdur daily. Amlodipine, 5 mg BID was added.   She presents to clinic today for post hospital f/u. She is here with her 2 daughters. Vital signs are stable and improved. BP is 138/78. EKG shows NSR with bifascicular block, unchanged from hospital EKG, however HR is improved and now stable. HR is 78 bpm. She feels well. No symptoms. No side effects with amlodipine. She is tolerating meds well.     Current Meds  Medication Sig  . amLODipine (NORVASC) 5 MG tablet Take 1 tablet (5 mg total) by mouth 2 (two) times daily.  Marland Kitchen apixaban (ELIQUIS) 5 MG TABS tablet Take 1 tablet (5 mg total) by mouth 2 (two) times daily.  Marland Kitchen dexamethasone (DECADRON) 4 MG tablet Take 1 tablet (4 mg total) by mouth 3 (three) times daily.  . feeding supplement, ENSURE ENLIVE, (ENSURE ENLIVE) LIQD Take 237 mLs by mouth 2 (two) times daily between meals.  . hydrochlorothiazide (HYDRODIURIL) 25 MG tablet Take 0.5 tablets (12.5 mg total) by mouth daily.  Marland Kitchen letrozole (FEMARA) 2.5 MG tablet Take 1 tablet (2.5 mg total) by mouth daily.  Marland Kitchen levETIRAcetam (KEPPRA) 500 MG tablet Take 1 tablet (500 mg total) by mouth 2 (two)  times daily.  Marland Kitchen lidocaine-prilocaine (EMLA) cream Apply to Porta-cath 1-2 hrs prior to access as directed. (Patient taking differently: Apply 1 application topically as needed (Apply to port-a-cath 1-2 hours prior to access.). )  . LORazepam (ATIVAN) 0.5 MG tablet Take one tab po 30 minutes prior to radiation or MRI. (Patient taking differently: Take 0.5 mg by mouth every 6 (six) hours as needed. Take one tab po 30 minutes prior to radiation or MRI.)  . magnesium hydroxide (MILK OF MAGNESIA) 400 MG/5ML suspension Take 5 mLs by mouth daily as needed for mild constipation.  Marland Kitchen morphine (MSIR) 15 MG tablet Take 1 tablet (15 mg total) by mouth every 6 (six) hours as needed for moderate pain or severe pain.  . pantoprazole (PROTONIX) 40 MG tablet Take 1 tablet (40 mg total) by mouth daily.  . Pediatric Multivit-Minerals-C (FLINTSTONES GUMMIES PO) Take 1 tablet by mouth daily.  Marland Kitchen zolpidem (AMBIEN) 5 MG tablet Take 1 tablet (5 mg total) by mouth at bedtime as needed for sleep.   Allergies  Allergen Reactions  . Morphine And Related Itching and Other (See Comments)    Pruritis with morphine - depends on strength of medication  . Dilaudid [Hydromorphone Hcl] Itching and Nausea And Vomiting   Past Medical History:  Diagnosis Date  . Chemotherapy-induced neuropathy ([place])   . DVT, lower extremity ([place])    left  02-12-2015  currently on Eliqius  . Endometrial cancer (  [facility]) oncologist-  dr Marko Plume   02-2015  . 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 Family history of breast cancer   . History of adenomatous polyp of colon    tubulovillious adenoma 09/ 2010  . History of ovarian cyst    complex  . History of radiation therapy 10/21/08-11/23/08 & 12/07/10/13/12/23 2010   ENDOMETRIOID   . History of uterine fibroid   . Hypertension   . Inguinal fluid collection 02/2015   right-drained x2  . Iron deficiency anemia    chronic severe  . Metastasis to lung ([place])   . Metastasis to lymph nodes  (H[facility])    Family History  Problem Relation Age of Onset  . Bone cancer Paternal Grandmother     OSSEOUS METASTASIS  . Heart disease Father   . Heart disease Sister   . Bone cancer Maternal Grandmother 75  . Leukemia Maternal Grandfather 20  . Liver cancer Maternal Uncle   . Breast cancer Cousin 88    maternal first cousin   Past Surgical History:  Procedure Laterality Date  . ABDOMINAL HYSTERECTOMY  2010  . APPLICATION OF WOUND VAC Right 05/12/2015   Procedure: APPLICATION OF WOUND VAC;  Surgeon: Hall Busing, MD;  Location[facility];  Service: General;  Laterality: Right;  . COLONOSCOPY W/ POLYPECTOMY  09-27-2008  . EXPLORATORY LAPAROTOMY /  TOTAL ABDOMINAL HYSTERECTOMY/ BILATERAL SALPINOOPHORECTOMY/  PARTIAL OMENTECTOMY  03-18-2008  . INGUINAL HERNIA REPAIR Right 05/12/2015   Procedure: INCISION AND DRAINGE RIGHT INGUINAL FLUID COLLECTION ;  Surgeon: Hall Busing, MD;  [facility];  Service: General;  Laterality: Right;  . IR GENERIC HISTORICAL  08/10/2015   IR US GUIDE VASC ACCESS RIGHT 08/10/2015 Jacqulynn Cadet, MD [facility]-INTER[facility] RAD  . IR GENERIC HISTORICAL  08/10/2015   IR FLUORO GUIDE CV LINE RIGHT 08/10/2015 Jacqulynn Cadet, MD [facility]-INTER[facility] RAD  . porta cath  06/2010   removal  . TUBAL LIGATION  1990's  . UMBILICAL HERNIA REPAIR     infant   Social History   Social History  . Marital status: Married    Spouse name: N/A  . Number of children: 3  . Years of education: N/A   Occupational History  . Not on file.   Social History Main Topics  . Smoking status: Never Smoker  . Smokeless tobacco: Never Used  . Alcohol use No  . Drug use: No  . Sexual activity: Not on file   Other Topics Concern  . Not on file   Social History Narrative  . No narrative on file     Review of Systems: General: negative for chills, fever, night sweats or weight changes.  Cardiovascular: negative for chest pain, dyspnea on exertion, edema,  orthopnea, palpitations, paroxysmal nocturnal dyspnea or shortness of breath Dermatological: negative for rash Respiratory: negative for cough or wheezing Urologic: negative for hematuria Abdominal: negative for nausea, vomiting, diarrhea, bright red blood per rectum, melena, or hematemesis Neurologic: negative for visual changes, syncope, or dizziness All other systems reviewed and are otherwise negative except as noted above.   Physical Exam:  Blood pressure 138/78, pulse 84, height 5\' 2"  (1.575 m), weight 153 lb (69.4 kg).  General appearance: alert, cooperative and no distress Neck: no carotid bruit and no JVD Lungs: clear to auscultation bilaterally Heart: regular rate and rhythm, S1, S2 normal, no murmur, click, rub or gallop Extremities: no LEE Pulses: 2+ and symmetric Skin: warm and dry Neurologic: Grossly  normal  EKG NSR. 78 bpm   ASSESSMENT AND PLAN:   1. Bradycardia: occurred in the setting of clonidine and labetalol use. Both meds were discontinued several days ago. HR improved in the mid 70s. BP is stable. No symptoms. Patient advised to monitor HR and BP at home over the next week. If any recurrence of bradycardia with HR <50, she is to call our office and we can further evaluate with outpatient monitor. No further w/u if she remains stable.   2. HTN: BP is controlled after recent medication adjustments. Clonidine and labetalol both discontinued for bradycardia. BP is stable/ well controlled with amlodipine and HCTZ. Tolerating meds well.   3. Metastatic Endometrial Cancer: undergoing treatment. Followed by Dr. Marko Plume   PLAN  F/u with Dr. Harrington Challenger in 3 months   Brittainy [facility] PA-C 10/12/2015 11:12 AM

## 2015-10-12 NOTE — Patient Instructions (Addendum)
Medication Instructions:  Your physician recommends that you continue on your current medications as directed. Please refer to the Current Medication list given to you today.   Labwork: None ordered  Testing/Procedures: None ordered  Follow-Up: Your physician recommends that you schedule a follow-up appointment in: 3 MONTHS WITH DR. ROSS    Any Other Special Instructions Will Be Listed Below (If Applicable).     If you need a refill on your cardiac medications before your next appointment, please call your pharmacy.   

## 2015-10-13 ENCOUNTER — Other Ambulatory Visit: Payer: Self-pay

## 2015-10-13 ENCOUNTER — Ambulatory Visit (HOSPITAL_BASED_OUTPATIENT_CLINIC_OR_DEPARTMENT_OTHER): Payer: Medicaid Other

## 2015-10-13 ENCOUNTER — Ambulatory Visit (HOSPITAL_BASED_OUTPATIENT_CLINIC_OR_DEPARTMENT_OTHER): Payer: Medicaid Other | Admitting: Oncology

## 2015-10-13 ENCOUNTER — Other Ambulatory Visit (HOSPITAL_BASED_OUTPATIENT_CLINIC_OR_DEPARTMENT_OTHER): Payer: Medicaid Other

## 2015-10-13 ENCOUNTER — Encounter: Payer: Self-pay | Admitting: Oncology

## 2015-10-13 VITALS — BP 171/79 | HR 91 | Temp 98.2°F | Resp 20 | Ht 62.0 in | Wt 154.8 lb

## 2015-10-13 DIAGNOSIS — C801 Malignant (primary) neoplasm, unspecified: Secondary | ICD-10-CM

## 2015-10-13 DIAGNOSIS — Z95828 Presence of other vascular implants and grafts: Secondary | ICD-10-CM

## 2015-10-13 DIAGNOSIS — C7800 Secondary malignant neoplasm of unspecified lung: Secondary | ICD-10-CM

## 2015-10-13 DIAGNOSIS — Z8542 Personal history of malignant neoplasm of other parts of uterus: Secondary | ICD-10-CM | POA: Diagnosis not present

## 2015-10-13 DIAGNOSIS — C7931 Secondary malignant neoplasm of brain: Secondary | ICD-10-CM

## 2015-10-13 DIAGNOSIS — E876 Hypokalemia: Secondary | ICD-10-CM

## 2015-10-13 DIAGNOSIS — I82402 Acute embolism and thrombosis of unspecified deep veins of left lower extremity: Secondary | ICD-10-CM

## 2015-10-13 DIAGNOSIS — Z8543 Personal history of malignant neoplasm of ovary: Secondary | ICD-10-CM | POA: Diagnosis not present

## 2015-10-13 DIAGNOSIS — C569 Malignant neoplasm of unspecified ovary: Secondary | ICD-10-CM

## 2015-10-13 DIAGNOSIS — G620 Drug-induced polyneuropathy: Secondary | ICD-10-CM

## 2015-10-13 DIAGNOSIS — C7802 Secondary malignant neoplasm of left lung: Secondary | ICD-10-CM | POA: Diagnosis not present

## 2015-10-13 DIAGNOSIS — Z7901 Long term (current) use of anticoagulants: Secondary | ICD-10-CM

## 2015-10-13 LAB — COMPREHENSIVE METABOLIC PANEL
ALT: 9 U/L (ref 0–55)
ANION GAP: 8 meq/L (ref 3–11)
AST: 8 U/L (ref 5–34)
Albumin: 2.8 g/dL — ABNORMAL LOW (ref 3.5–5.0)
Alkaline Phosphatase: 99 U/L (ref 40–150)
BUN: 34.3 mg/dL — AB (ref 7.0–26.0)
CALCIUM: 8.5 mg/dL (ref 8.4–10.4)
CHLORIDE: 102 meq/L (ref 98–109)
CO2: 26 mEq/L (ref 22–29)
Creatinine: 0.8 mg/dL (ref 0.6–1.1)
Glucose: 128 mg/dl (ref 70–140)
POTASSIUM: 3.8 meq/L (ref 3.5–5.1)
Sodium: 136 mEq/L (ref 136–145)
Total Bilirubin: 0.5 mg/dL (ref 0.20–1.20)
Total Protein: 6.1 g/dL — ABNORMAL LOW (ref 6.4–8.3)

## 2015-10-13 LAB — CBC WITH DIFFERENTIAL/PLATELET
BASO%: 0 % (ref 0.0–2.0)
BASOS ABS: 0 10*3/uL (ref 0.0–0.1)
EOS%: 0 % (ref 0.0–7.0)
Eosinophils Absolute: 0 10*3/uL (ref 0.0–0.5)
HEMATOCRIT: 32.8 % — AB (ref 34.8–46.6)
HGB: 10.8 g/dL — ABNORMAL LOW (ref 11.6–15.9)
LYMPH#: 0.5 10*3/uL — AB (ref 0.9–3.3)
LYMPH%: 2.6 % — ABNORMAL LOW (ref 14.0–49.7)
MCH: 25.1 pg (ref 25.1–34.0)
MCHC: 32.9 g/dL (ref 31.5–36.0)
MCV: 76.3 fL — AB (ref 79.5–101.0)
MONO#: 0.6 10*3/uL (ref 0.1–0.9)
MONO%: 3.3 % (ref 0.0–14.0)
NEUT#: 16.5 10*3/uL — ABNORMAL HIGH (ref 1.5–6.5)
NEUT%: 94.1 % — AB (ref 38.4–76.8)
Platelets: 332 10*3/uL (ref 145–400)
RBC: 4.3 10*6/uL (ref 3.70–5.45)
RDW: 22.6 % — ABNORMAL HIGH (ref 11.2–14.5)
WBC: 17.5 10*3/uL — ABNORMAL HIGH (ref 3.9–10.3)

## 2015-10-13 MED ORDER — POTASSIUM CHLORIDE ER 10 MEQ PO TBCR: 10.0000 meq | EXTENDED_RELEASE_TABLET | Freq: Every day | ORAL | 1 refills | Status: DC

## 2015-10-13 MED ORDER — SODIUM CHLORIDE 0.9% FLUSH
10.0000 mL | INTRAVENOUS | Status: DC | PRN
  Administered 2015-10-13: 10 mL via INTRAVENOUS
  Filled 2015-10-13: qty 10

## 2015-10-13 MED ORDER — HEPARIN SOD (PORK) LOCK FLUSH 100 UNIT/ML IV SOLN
500.0000 [IU] | Freq: Once | INTRAVENOUS | Status: AC
  Administered 2015-10-13: 500 [IU] via INTRAVENOUS
  Filled 2015-10-13: qty 5

## 2015-10-13 NOTE — Progress Notes (Signed)
OFFICE PROGRESS NOTE   October 13, 2015   Physicians: E.Rossi, J.Kinard/ M. Manning/ J.Venetia Maxon, C.Windham, P.Zenaida Niece Trigt Sheryn Bison, [facility]) Dietrich Pates  INTERVAL HISTORY:   Patient is seen, together with one daughter and another family member, in follow up of metastatic gyn carcinoma involving cerebellum, chest and pelvic/ periaortic adenopathy. She had stereotactic radiosurgery to single left cerebellar met on 10-07-15 and continues steroid taper per rad onc.  She continues letrozole, begun 08-18-15 following one cycle of gemzar, as she needed chest radiation at that time.  She was hospitalized again 10-6 thru 10-09-15 with hypertension and bradycardia, seen by cardiology with adjustments in antihypertensives. EKG that admission had NSR with bifasicular block. Patient and family tell me that they were not able to meet with Palliative Care in hospital and that they do not want any paper work about advanced directives.   Patient reports some improvement in balance and mobility since [facility]. She sleeps on couch and is able to get up using walker and with assistance. She did have some PT evaluation in hospital, including recommendation for [facility] PT during hospitalization 9-22 thru 10-01-15, but apparently has limited Medicaid benefits for [facility] PT (per case manager note 10-01-15). Regardless, we will ask HH if any options for [facility] PT now, as at least home safety evaluation seems appropriate. Patient denies SOB with present activity level, also denies any significant cough or any hemoptysis. She has needed less pain medication for chest.No bleeding.  Appetite is better, no vomiting, bowels are moving adequately. No abdominal or pelvic pain. No bleeding. No LE swelling. No mucositis. No problems with PAC. No HA, no new neurologic symptoms. She is tolerating present cardiac medications well. No fever or symptoms of infection. Remainder of 10 point Review of Systems negative/ unchanged.     PAC placed by IR on  08-10-15 OnEliquis for LLE DVT  Genetics testing negative Breast Ovarian and gyn panel by [facility] 06-14-15.  CA 125 on 02-14-15 642.9 ER + on cytology 02-2015 Feraheme 02-16-15 and [id number].    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 She was lost to follow up for the gyn cancer after 11-2010, until presented with recurrent disease. Patient reported right inguinal mass for ~ 2 months, then LLE pain for ~ 3 weeks when she presented to ED on 02-12-15. CT AP showed nodules in lung bases suspicious for metastatic disease, liver not remarkable, no hydronephrosis, 6 mm right renal calculus, thrombus left external iliac vein into left common femoral vein, retroperitoneal and pelvic adenopathy, an 8.5 x 7.1 cm necrotic right inguinal node, post hyst ooph, no ascites, likely tumor involvement in left psoas with adjacent involvement of L4 and L5 vertebral bodies; CT chest subsequently had multiple bilateral pulmonary nodules and central adenopathy. Patient was begun on heparin qtt which was transitioned to Eliquis (on pharmaceutical assistance program). CA 125 from 02-14-15 was 642.9, this having been 3.6 in 08-2010. Pathology from right inguinal nodal mass aspiration and core needle biopsy 02-15-15 necrotic material with metastatic adenocarcinoma with immunostains consistent with gyn primary ([id number], ER+). She was transfused 2 units PRBCs for hgb 6.7. Iron studies 2-12 with serum iron 14 and %sat 7, given feraheme x2. Dr Andrey Farmer saw in consultation prior to starting carboplatin taxotere  on 02-24-15. She reacted to [place] skin test prior to cycle 3, treatment continued with taxotere only thru  cycle 5 on 05-26-15, tolerated neulasta poorly. She had I&D and capsulectomy of seroma cavity right inguinal region on 05-12-15. CT CAP 06-14-15 significant improvement in all areas of metastatic disease with exception of enlarging cystic mass in left hilum. Echocardiogram 07-19-15 done to evaluate central chest mass: EF 60-65% and minimal pericardial effusion. PET 08-01-15 showedsignificant progression of multiple areas of disease in addition to the chest involvement. She had first gemzar 08-11-15, then began letrozole on 08-18-15, due to concomitant chest radiation, ~ 37 cGy given thru ~ 09-14-15. She was found to have single left cerebellar metastasis 09-28-15, post SRS on 10-07-15.  Objective:  Vital signs in last 24 hours:  BP (!) 171/79 (BP Location: Left Arm, Patient Position: Sitting) Comment: informed nurse  Pulse 91   Temp 98.2 F (36.8 C) (Oral)   Resp 20   Ht _0  (1.575 m)   Wt 154 lb 12.8 oz (70.2 kg)   SpO2 100%   BMI 28.31 kg/m  Weight up 2 lbs Alert, oriented and cooperative. In WC, respirations not labored RA. More talkative and sounds more energetic, note on decadron   HEENT:PERRL, sclerae not icteric. Oral mucosa moist without thrush or other lesions, posterior pharynx minimal dull erythema without exudate  No JVD.  Lymphatics:no cervical,supraclavicular adenopathy Resp: diminished BS thru left chest, no wheezes or rales, increased BS thru right chest. No use of accessory muscles Cardio: regular rate and rhythm. No gallop. GI: soft, nontender, not distended. Normally active bowel sounds.  Musculoskeletal/ Extremities: LE without pitting edema, cords, tenderness Neuro: no change peripheral neuropathy. Speech fluent and appropriate. Moves all extremities in WC.  Skin without rash, ecchymosis, petechiae Portacath-without erythema or tenderness  Lab Results:  Results for orders placed or performed in visit on 10/13/15  CBC with Differential  Result Value Ref Range   WBC  17.5 (H) 3.9 - 10.3 10e3/uL   NEUT# 16.5 (H) 1.5 - 6.5 10e3/uL   HGB 10.8 (L) 11.6 - 15.9 g/dL   HCT 32.8 (L) 34.8 - 46.6 %   Platelets 332 145 - 400 10e3/uL   MCV 76.3 (L) 79.5 - 101.0 fL   MCH 25.1 25.1 - 34.0 pg   MCHC 32.9 31.5 - 36.0 g/dL   RBC 4.30 3.70 - 5.45 10e6/uL   RDW 22.6 (H) 11.2 - 14.5 %   lymph# 0.5 (L) 0.9 - 3.3 10e3/uL   MONO# 0.6 0.1 - 0.9 10e3/uL   Eosinophils Absolute 0.0 0.0 - 0.5 10e3/uL   Basophils Absolute 0.0 0.0 - 0.1 10e3/uL   NEUT% 94.1 (H) 38.4 - 76.8 %   LYMPH% 2.6 (L) 14.0 - 49.7 %   MONO% 3.3 0.0 - 14.0 %   EOS% 0.0 0.0 - 7.0 %   BASO% 0.0 0.0 - 2.0 %  Comprehensive metabolic panel  Result Value Ref Range   Sodium 136 136 - 145 mEq/L   Potassium 3.8 3.5 - 5.1 mEq/L   Chloride 102 98 - 109 mEq/L   CO2 26 22 - 29 mEq/L   Glucose 128 70 - 140 mg/dl   BUN 34.3 (H) 7.0 - 26.0 mg/dL   Creatinine 0.8 0.6 - 1.1 mg/dL   Total Bilirubin 0.50 0.20 - 1.20 mg/dL   Alkaline Phosphatase 99 40 - 150 U/L   AST 8 5 - 34 U/L   ALT 9 0 - 55 U/L   Total Protein 6.1 (  L) 6.4 - 8.3 g/dL   Albumin 2.8 (L) 3.5 - 5.0 g/dL   Calcium 8.5 8.4 - 10.4 mg/dL   Anion Gap 8 3 - 11 mEq/L   EGFR >90 >90 ml/min/1.73 m2   Labs reviewed at time of visit.  Studies/Results:  No results found.  Medications: I have reviewed the patient's current medications.  Patient / family not sure of decadron taper and I have asked radiation oncology to let them know recommended taper   Refusing flu vaccine (tho best to wait for steroids to taper prior to that if she changes her mind. See A/P below)  DISCUSSION Steroid taper as above; Rad onc also to let them know follow up for the [facility] per my communication with that APP now. She is to see Dr Sondra Come 10-25, which is follow up for recent palliative chest radiation.  Patient tells me that she wants to continue treatment for the widely metastatic cancer. As above, she and family state that they do not want written information regarding Advance  Directives. I have reviewed with them that widely metastatic gyn cancer is not potentially curable by any medical interventions, tho certainly anything that may improve her situation can be considered. I have reminded them that resuscitation and life support are potentially useful only if the underlying problem can be resolved just given more time, which is not this situation. I have told them that life support generally requires sedation for the patient to tolerate, so that is not quality time. It is difficult to tell if they understand this information.  Certainly we are all glad that [place] has improved her mobility and balance, and that appetite is better on steroids. She is tolerating letrozole without difficulty, family aware that this is being used as non-chemo treatment for the malignancy for now. Patient wants to resume gemzar, but agrees to waiting another 2 weeks or so to let rest of recent associated problems hopefully stabilize and to allow some taper on the steroids. I will see her in ~ 2 weeks and can try to resume gemzar every other week following that visit.  Patient is very appreciative of Dr Dorris Carnes' care.   Message to RN to ask [facility] if home PT is possible (may not be, see above and case manager DC note 09-2015)  Assessment/Plan:  1.metastatic adenocarcinoma of gyn primary, in patient with history of IC clear cell ovarian and IA high grade endometrial carcinomas 03-2008 (incomplete staging), post TAH BSO, adjuvant carboplatin taxol x 6 cycles and radiation. Lost to follow up after 11-2010 until presented with metastatic gyn cancer 02-2015. Initial response to [place] taxotere, then [place] reaction and progressive peripheral neuropathy with taxane. Extensive progression including left chest, gemzar x 1 8-10 -17 then held for radiation  to left lung.  Letrozole begun  for some coverage with radiation.  Solitary left cerebellar met, post SRS 10-07-15. On steroid taper. Post I&D  with capsulectomy of seroma cavity 05-12-15, healed. adenocarcinoma in that path. 2.DNR rescinded during recent hospitalization. She has widely metastatic malignancy which is incurable by any medical treatment.  3.LLE DVT documented 02-12-15: on Eliquis, no longer symptomatic. No IVC filter. Eliquis approved thru [employer] assistance x 1 year.  4. Bradycardia and HTN after SRS, adjustments in antihypertensives during hospitalization then. Appreciate help from cardiology. 5. chemo nausea and vomiting improved with aloxi and protonix with prior regimen. PO intake better with decadron now 6.uncontrolled HTN at presentation with metastatic disease.  She has refused to  get PCP.  7.Itching with morphine, nausea with dilaudid. Has not tried duragesic. 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 Stable or slightly progressive, feet >hands with additional taxane recently. 10.corrected calcium improved sinceIV bisphosphonate as zometa 08-18-15 11. hypoK improved on supplement 12.overdue mammograms, general medical care, colonoscopy. Noncompliance with follow up after cancer diagnoses and adjuvant treatment after ~ 2012. Noncompliant with care for HTN over past several years, despite family efforts ( daughter is pediatrician) Needs PCP but has not followed thru with referral to [facility]. 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 Repeat colonoscopy recommended 10-2009, apparently not done. Note iron deficiency. FOB not yet done, see above. 14.poor nutritional status 15.refuses flu vaccine, which would be best even so to give when steroids have been tapered if she changes her mind. I have told patient and family that, if she refuses flu vaccine, that all family who comes in contact with her should have flu vaccine themselves.   All questions answered. Time spent 25 min including >50% counseling and coordination of care. CC Drs  Tammi Klippel, Delena Serve, MD   10/13/2015, 5:51 PM

## 2015-10-14 ENCOUNTER — Telehealth: Payer: Self-pay | Admitting: Radiation Oncology

## 2015-10-14 NOTE — Progress Notes (Signed)
  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  Beams/energy: SBRT/SRT-3D  //  6FFF  Narrative: The patient tolerated SRS treatment relatively well.  Plan: The patient has completed radiation treatment. The patient will return to radiation oncology clinic for routine followup in one month. I advised her to call or return sooner if she has any questions or concerns related to her recovery or treatment. ________________________________  Sheral Apley. Tammi Klippel, M.D.  This document serves as a record of services personally performed by Tyler Pita, MD. It was created on his behalf by Maryla Morrow, a trained medical scribe. The creation of this record is based on the scribe's personal observations and the provider's statements to them. This document has been checked and approved by the attending provider.

## 2015-10-14 NOTE — Telephone Encounter (Signed)
I spoke with the patient to confirm steroid taper. She's started tapering to 4mg  TID, and will switch to 4 mg BID over the weekend. She will stay on this for 1 week, then 2mg  BID for 1 week, then 2 mg daily, and then stop. She will return in 3 weeks to see me for follow up of radiation.

## 2015-10-19 ENCOUNTER — Telehealth: Payer: Self-pay

## 2015-10-19 NOTE — Telephone Encounter (Signed)
Faxed demographics, dr.'s name and NPI, insurance information, and office note from 10-13-15 as requested by Anne Ng in "In take" She stated that there is a 24-48 hour turn around for determination of eligibility.

## 2015-10-20 ENCOUNTER — Telehealth: Payer: Self-pay

## 2015-10-20 DIAGNOSIS — C541 Malignant neoplasm of endometrium: Secondary | ICD-10-CM

## 2015-10-20 NOTE — Telephone Encounter (Signed)
Received telehealth message pt had question regarding her prescriptions.  LVM to call us.

## 2015-10-20 NOTE — Telephone Encounter (Signed)
lvm attempting to return call.

## 2015-10-21 ENCOUNTER — Telehealth: Payer: Self-pay | Admitting: Radiation Oncology

## 2015-10-21 ENCOUNTER — Other Ambulatory Visit: Payer: Self-pay | Admitting: Radiation Oncology

## 2015-10-21 MED ORDER — LETROZOLE 2.5 MG PO TABS: 2.5000 mg | ORAL_TABLET | Freq: Every day | ORAL | 2 refills | Status: DC

## 2015-10-21 MED ORDER — DEXAMETHASONE 4 MG PO TABS: 4.0000 mg | ORAL_TABLET | Freq: Two times a day (BID) | ORAL | 0 refills | Status: DC

## 2015-10-21 NOTE — Addendum Note (Signed)
Addended by: Janace Hoard on: 10/21/2015 01:24 PM   Modules accepted: Orders

## 2015-10-21 NOTE — Telephone Encounter (Signed)
Received message from Dr. Mariana Kaufman nurse that this patient called requesting a refill of her decadron. Phoned patient to inquire of her status and where she is on the taper from 10/14/15. Patient reports she continues to take decadron 4 mg tid and hasn't tapered because of nausea. Denies headache, dizziness, diplopia or ringing in the ears. Informed Dara Lords of these findings.Phoned patient back. Explained decadron refill has [facility] [place]. Strongly encouraged her to begin the taper PA, Perkins prescribed on 10/14/15 (switch to 4 mg BID over the weekend. She will stay on this for 1 week, then 2mg  BID for 1 week, then 2 mg daily, and then stop). Questioned if patient is taking protonix as directed and she confirms she is. Stressed the importance of protonix while taking decadron. Patient verbalized understanding.

## 2015-10-21 NOTE — Telephone Encounter (Signed)
Daughter called requesting dexamethasone and letrozole refill. B/c Dr Tammi Klippel is managing the decadron taper this was forwarded to his nurse Sam who said she would take care of decadron. Letrozole refilled per protocol.

## 2015-10-26 ENCOUNTER — Ambulatory Visit
Admission: RE | Admit: 2015-10-26 | Discharge: 2015-10-26 | Disposition: A | Discharge status: Home / Self Care | Payer: Medicaid Other | Source: Ambulatory Visit | Attending: Radiation Oncology | Admitting: Radiation Oncology

## 2015-10-26 ENCOUNTER — Ambulatory Visit: Payer: Medicaid Other | Admitting: Radiation Oncology

## 2015-10-26 ENCOUNTER — Encounter: Payer: Self-pay | Admitting: Radiation Oncology

## 2015-10-26 VITALS — BP 136/65 | HR 70 | Temp 98.3°F | Resp 20 | Ht 62.0 in

## 2015-10-26 DIAGNOSIS — C541 Malignant neoplasm of endometrium: Secondary | ICD-10-CM | POA: Diagnosis not present

## 2015-10-26 NOTE — Progress Notes (Signed)
Radiation Oncology         [phone] ________________________________  Name: [name] MRN: [id number]  Date: 10/26/2015  DOB: 09/11/1955  Follow-Up Visit Note  CC: Evlyn Clines, MD  Gordy Levan, MD    ICD-9-CM ICD-10-CM   1. Endometrial cancer, FIGO stage IVB (HCC) 182.0 C54.1     Diagnosis: Metastatic ovarian cancer  Interval Since Last Radiation: 19 days  10/07/15: PTV1 Lt Cerebellar 23 mm was treated to 18 Gy in one fraction by Dr. Tammi Klippel.  08/24/2015-09/15/2015: Left Lung, 37.5 Gy in 15 fractions by Dr. Sondra Come.  2010: The patient completed external beam radiation therapy and high-dose rate brachytherapy as part of management of her stage IC endometrioid adenocarcinoma.  The patient completed her radiation treatments on 12/23/2008 by Dr. Sondra Come.  Narrative:  The patient returns today for routine follow-up. She denies any pain at this time, but states that she is easily fatigued. She denies any SOB when ambulating. She reports that "it hurts" when she goes to the bathroom to defecate and is currently not obtaining relief with stool softner - Senokot. The pain in the chest or hemoptysis  ALLERGIES:  is allergic to morphine and related and dilaudid [hydromorphone hcl].  Meds: Current Outpatient Prescriptions  Medication Sig Dispense Refill  . amLODipine (NORVASC) 5 MG tablet Take 1 tablet (5 mg total) by mouth 2 (two) times daily. 60 tablet 3  . apixaban (ELIQUIS) 5 MG TABS tablet Take 1 tablet (5 mg total) by mouth 2 (two) times daily. 60 tablet 0  . dexamethasone (DECADRON) 4 MG tablet Take 1 tablet (4 mg total) by mouth 2 (two) times daily. 28 tablet 0  . hydrochlorothiazide (HYDRODIURIL) 25 MG tablet Take 0.5 tablets (12.5 mg total) by mouth daily. 30 tablet 0  . letrozole (FEMARA) 2.5 MG tablet Take 1 tablet (2.5 mg total) by mouth daily. 30 tablet 2  . levETIRAcetam (KEPPRA) 500 MG tablet Take 1 tablet (500 mg total) by mouth 2 (two) times daily. 60  tablet 0  . lidocaine-prilocaine (EMLA) cream Apply to Porta-cath 1-2 hrs prior to access as directed. (Patient taking differently: Apply 1 application topically as needed (Apply to port-a-cath 1-2 hours prior to access.). ) 30 g 1  . LORazepam (ATIVAN) 0.5 MG tablet Take one tab po 30 minutes prior to radiation or MRI. (Patient taking differently: Take 0.5 mg by mouth every 6 (six) hours as needed. Take one tab po 30 minutes prior to radiation or MRI.) 30 tablet 0  . magnesium hydroxide (MILK OF MAGNESIA) 400 MG/5ML suspension Take 5 mLs by mouth daily as needed for mild constipation.    Marland Kitchen morphine (MSIR) 15 MG tablet Take 1 tablet (15 mg total) by mouth every 6 (six) hours as needed for moderate pain or severe pain. 30 tablet 0  . pantoprazole (PROTONIX) 40 MG tablet Take 1 tablet (40 mg total) by mouth daily. 30 tablet 2  . Pediatric Multivit-Minerals-C (FLINTSTONES GUMMIES PO) Take 1 tablet by mouth daily.    . potassium chloride (K-DUR) 10 MEQ tablet Take 1 tablet (10 mEq total) by mouth daily. 30 tablet 1  . Wound Dressings (SONAFINE EX) Apply 1 application topically as needed (For wound dressing.).     Marland Kitchen zolpidem (AMBIEN) 5 MG tablet Take 1 tablet (5 mg total) by mouth at bedtime as needed for sleep. 30 tablet 2  . feeding supplement, ENSURE ENLIVE, (ENSURE ENLIVE) LIQD Take 237 mLs by mouth 2 (two) times daily between meals. (Patient  not taking: Reported on 10/26/2015) 237 mL 12   No current facility-administered medications for this encounter.     Physical Findings: The patient is in no acute distress. Patient is alert and oriented.  height is 5\' 2"  (1.575 m). Her temperature is 98.3 F (36.8 C). Her blood pressure is 136/65 and her pulse is 70. Her respiration is 20 and oxygen saturation is 100%.   Presents in wheelchair. Lungs are clear to auscultation bilaterally. Heart has regular rate and rhythm. No palpable cervical, supraclavicular, or axillary adenopathy.  Lab Findings: Lab  Results  Component Value Date   WBC 17.5 (H) 10/13/2015   HGB 10.8 (L) 10/13/2015   HCT 32.8 (L) 10/13/2015   MCV 76.3 (L) 10/13/2015   PLT 332 10/13/2015    Radiographic Findings: Ct Head Wo Contrast  Result Date: 10/07/2015 CLINICAL DATA:  Symptomatic bradycardia to the 40s. Left cerebellar mass seen on recent MRI of 09/28/2015. EXAM: CT HEAD WITHOUT CONTRAST TECHNIQUE: Contiguous axial images were obtained from the base of the skull through the vertex without intravenous contrast. COMPARISON:  CT and MRI from 09/28/2015 FINDINGS: Brain: Left cerebellar 1.5 x 1.6 x 1.8 cm intra-axial mass with surrounding vasogenic edema is again seen with slight decrease in mass effect on the adjacent fourth ventricle. Edema does cross midline as before. No hemorrhage,, acute infarction or extra-axial fluid collections. Vascular: Minimal calcific atherosclerosis of the skull base. Skull: Probable old calcified cephalohematoma along the left posterior parietal skull. No acute fracture nor bone destruction. Sinuses/Orbits: Normal Other: None IMPRESSION: Known left cerebellar metastasis with vasogenic edema. Subjectively less mass effect on the adjacent fourth ventricle, albeit minimal. Electronically Signed   By: Ashley Royalty M.D.   On: 10/07/2015 19:18   Ct Head Wo Contrast  Result Date: 09/28/2015 CLINICAL DATA:  60 year old female status post fall in the middle of the night. On Coumadin. Personal history of endometrial carcinoma, and large left lung mass in July. Initial encounter. EXAM: CT HEAD WITHOUT CONTRAST TECHNIQUE: Contiguous axial images were obtained from the base of the skull through the vertex without intravenous contrast. COMPARISON:  PET-CT 08/01/2015. FINDINGS: Brain: Posterior fossa mass effect and abnormal hypodensity in the central and left cerebellar hemisphere encompassing an area of about 3.5 cm (series 2, image 8). Mass effect on the fourth ventricle, but no ventriculomegaly. Crowding of the  pre pontine cistern which remains patent. Edema mildly crosses midline to the central right cerebellum. Supratentorial gray-white matter differentiation appears normal. No supratentorial mass effect. Partially empty sella incidentally noted. No acute intracranial hemorrhage identified. No cortically based acute infarct identified. Vascular: Mild Calcified atherosclerosis at the skull base. Skull: left posterior skull exostosis incidentally noted. No acute or suspicious osseous lesion identified. Sinuses/Orbits: Clear. Other: Negative orbit and scalp soft tissues. IMPRESSION: 1. Cerebellar edema centrally on the left with suggestion of an underlying 3.5 cm left cerebellar mass. Mass effect on the fourth ventricle but no ventriculomegaly or loss of basilar cistern patency. 2. Cerebellar metastatic disease favored in this clinical setting. Follow-up Brain MRI without and with contrast would characterize further. 3. No other acute or metastatic intracranial abnormality identified. Electronically Signed   By: Genevie Ann M.D.   On: 09/28/2015 10:35   Mr Jeri Cos [id number] Contrast  Result Date: 09/29/2015 CLINICAL DATA:  Nausea and vomiting, chest pain for 1 day. History of metastatic endometrial and ovarian cancer, on radiation therapy. Followup probable LEFT cerebellar mass. EXAM: MRI HEAD WITHOUT AND WITH CONTRAST TECHNIQUE: Multiplanar, multiecho pulse sequences  of the brain and surrounding structures were obtained without and with intravenous contrast. CONTRAST:  80mL MULTIHANCE GADOBENATE DIMEGLUMINE 529 MG/ML IV SOLN COMPARISON:  CT HEAD September 28, 2015 at 1010 hours. FINDINGS: BRAIN: T2 bright heterogeneously enhancing 2 x 2.3 x 2 cm (transverse by AP by CC) LEFT cerebellar mass corresponding to C2 abnormality. Faint susceptibility artifact along the margins. Surrounding T2 bright vasogenic edema with mild mass effect on the fourth ventricle which remains patent. No additional intraparenchymal mass. Supratentorial  ventricles are normal. A few scattered sub cm supratentorial white matter FLAIR T2 hyperintensities compatible with mild chronic small vessel ischemic disease, within normal range for patient's age. No abnormal extra-axial fluid collections, extra-axial enhancement or extra-axial masses. VASCULAR: Normal major intracranial vascular flow voids present at skull base. SKULL AND UPPER CERVICAL SPINE: No abnormal sellar expansion. No suspicious calvarial bone marrow signal. Craniocervical junction maintained. Torus palatini. LEFT parietal exostosis without enhancement. SINUSES/ORBITS: The mastoid air-cells and included paranasal sinuses are well-aerated. The included ocular globes and orbital contents are non-suspicious. OTHER: None. 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 Vasogenic edema with mild mass effect on the fourth ventricle, no obstructive hydrocephalus. Otherwise negative MRI of the head for age. Electronically Signed   By: Elon Alas M.D.   On: 09/29/2015 03:52    Impression: Clinically stable. Denies pain in the chest at this time and her breathing is stable. CT of the chest on 09/23/15 showed a decrease in size of some of the pulmonary metastatic lesions.  Plan: The patient will follow up on a PRN basis concerning her chest radiation treatments. She will continue close follow ups under Dr. Marko Plume and will be seen by the [facility] team in 2 months with scans likely at that time.  ____________________________________ -----------------------------------  Blair Promise, PhD, MD  This document serves as a record of services personally performed by Gery Pray, MD. It was created on his behalf by Darcus Austin, a trained medical scribe. The creation of this record is based on the scribe's personal observations and the provider's statements to them. This document has been checked and approved by the attending provider.

## 2015-10-26 NOTE — Addendum Note (Signed)
Encounter addended by: Jacqulyn Liner, RN on: 10/26/2015  5:46 PM<BR>    Actions taken: Charge Capture section accepted

## 2015-10-26 NOTE — Progress Notes (Addendum)
Ms. Peary here for reassessment S/P XRT to the Left Lung. VS Stable.  She denies any pain at this time, but states that she is easily fatigued. She denies any SOB when ambulating. Travel by wheelchair. She reports that "it hurts" when she goes to the bathroom to defecate and is currently not obtaining relief with stool softner - Senokot.   ,BP 136/65 (BP Location: Right Arm, Patient Position: Sitting, Cuff Size: Normal)   Pulse 70   Temp 98.3 F (36.8 C)   Resp 20   Ht 5\' 2"  (1.575 m)   SpO2 100%    Wt Readings from Last 3 Encounters:  10/13/15 154 lb 12.8 oz (70.2 kg)  10/12/15 153 lb (69.4 kg)  10/07/15 160 lb 7.9 oz (72.8 kg)  Unable to weight today due to weakness.

## 2015-10-27 ENCOUNTER — Encounter: Payer: Self-pay | Admitting: Oncology

## 2015-10-27 ENCOUNTER — Telehealth: Payer: Self-pay | Admitting: Oncology

## 2015-10-27 ENCOUNTER — Encounter: Payer: Self-pay | Admitting: Radiation Oncology

## 2015-10-27 NOTE — Telephone Encounter (Signed)
Called in refills for Ativan and hydrochlorothiazide to [facility] [employer] per Dr. Sondra Come.

## 2015-10-28 NOTE — Telephone Encounter (Addendum)
Spoke with [name] in Referral Support at [facility]. [name] does not have a diagnosis that Medicaid would cover home PT. Dr. Marko Plume notified.

## 2015-10-28 NOTE — Telephone Encounter (Signed)
  Lisa, Madden Female, 60 y.o., 02-15-1955 Weight:  154 lb 12.8 oz (70.2 kg) Phone:  [phone] PCP:  Gordy Levan, MD [facility] Release  General MRN:  [id number] MyChart:  Active Next Appt:  10/31/2015 You completed this message on 10/28/2015. The information that appears might not be up to date.    home PT  Received: 2 weeks ago  Message Contents  Gordy Levan, MD  Janace Hoard, RN; Baruch Merl, RN  Remarks  Public Remarks            Please ask [facility] if she is eligible for home PT eval for safety.  Cerebellar brain met post stereotactic radiosurgery. Weak and unsteady.   May not be eligible for some reason, (maybe Medicaid?) , which is why patient / family understood this was not set up at hospital DC --?  Need to let patient/ family know what gets figured out   thanks

## 2015-10-29 ENCOUNTER — Other Ambulatory Visit: Payer: Self-pay | Admitting: Oncology

## 2015-10-29 ENCOUNTER — Encounter: Payer: Self-pay | Admitting: Oncology

## 2015-10-29 DIAGNOSIS — C569 Malignant neoplasm of unspecified ovary: Secondary | ICD-10-CM

## 2015-10-31 ENCOUNTER — Other Ambulatory Visit (HOSPITAL_BASED_OUTPATIENT_CLINIC_OR_DEPARTMENT_OTHER): Payer: Medicaid Other

## 2015-10-31 ENCOUNTER — Encounter: Payer: Self-pay | Admitting: *Deleted

## 2015-10-31 ENCOUNTER — Ambulatory Visit (HOSPITAL_BASED_OUTPATIENT_CLINIC_OR_DEPARTMENT_OTHER): Payer: Medicaid Other | Admitting: Oncology

## 2015-10-31 ENCOUNTER — Ambulatory Visit: Payer: Medicaid Other

## 2015-10-31 ENCOUNTER — Encounter: Payer: Self-pay | Admitting: Oncology

## 2015-10-31 VITALS — BP 149/82 | HR 100 | Temp 98.3°F | Resp 18 | Ht 62.0 in

## 2015-10-31 DIAGNOSIS — G620 Drug-induced polyneuropathy: Secondary | ICD-10-CM | POA: Diagnosis not present

## 2015-10-31 DIAGNOSIS — C569 Malignant neoplasm of unspecified ovary: Secondary | ICD-10-CM

## 2015-10-31 DIAGNOSIS — C7802 Secondary malignant neoplasm of left lung: Secondary | ICD-10-CM | POA: Diagnosis not present

## 2015-10-31 DIAGNOSIS — Z7901 Long term (current) use of anticoagulants: Secondary | ICD-10-CM

## 2015-10-31 DIAGNOSIS — C541 Malignant neoplasm of endometrium: Secondary | ICD-10-CM

## 2015-10-31 DIAGNOSIS — C7931 Secondary malignant neoplasm of brain: Secondary | ICD-10-CM

## 2015-10-31 DIAGNOSIS — C801 Malignant (primary) neoplasm, unspecified: Secondary | ICD-10-CM

## 2015-10-31 DIAGNOSIS — E876 Hypokalemia: Secondary | ICD-10-CM | POA: Diagnosis not present

## 2015-10-31 DIAGNOSIS — Z8543 Personal history of malignant neoplasm of ovary: Secondary | ICD-10-CM | POA: Diagnosis not present

## 2015-10-31 DIAGNOSIS — I82402 Acute embolism and thrombosis of unspecified deep veins of left lower extremity: Secondary | ICD-10-CM

## 2015-10-31 DIAGNOSIS — Z8542 Personal history of malignant neoplasm of other parts of uterus: Secondary | ICD-10-CM

## 2015-10-31 DIAGNOSIS — Z95828 Presence of other vascular implants and grafts: Secondary | ICD-10-CM

## 2015-10-31 LAB — COMPREHENSIVE METABOLIC PANEL
ALT: 20 U/L (ref 0–55)
ANION GAP: 11 meq/L (ref 3–11)
AST: 12 U/L (ref 5–34)
Albumin: 3 g/dL — ABNORMAL LOW (ref 3.5–5.0)
Alkaline Phosphatase: 99 U/L (ref 40–150)
BUN: 34.5 mg/dL — ABNORMAL HIGH (ref 7.0–26.0)
CHLORIDE: 105 meq/L (ref 98–109)
CO2: 25 meq/L (ref 22–29)
CREATININE: 0.8 mg/dL (ref 0.6–1.1)
Calcium: 8.4 mg/dL (ref 8.4–10.4)
Glucose: 132 mg/dl (ref 70–140)
POTASSIUM: 3.1 meq/L — AB (ref 3.5–5.1)
Sodium: 141 mEq/L (ref 136–145)
Total Bilirubin: 0.55 mg/dL (ref 0.20–1.20)
Total Protein: 5.9 g/dL — ABNORMAL LOW (ref 6.4–8.3)

## 2015-10-31 LAB — CBC WITH DIFFERENTIAL/PLATELET
BASO%: 0 % (ref 0.0–2.0)
BASOS ABS: 0 10*3/uL (ref 0.0–0.1)
EOS%: 0 % (ref 0.0–7.0)
Eosinophils Absolute: 0 10*3/uL (ref 0.0–0.5)
HCT: 29.6 % — ABNORMAL LOW (ref 34.8–46.6)
HGB: 10.1 g/dL — ABNORMAL LOW (ref 11.6–15.9)
LYMPH%: 3.4 % — AB (ref 14.0–49.7)
MCH: 26.9 pg (ref 25.1–34.0)
MCHC: 34.1 g/dL (ref 31.5–36.0)
MCV: 78.7 fL — ABNORMAL LOW (ref 79.5–101.0)
MONO#: 0.3 10*3/uL (ref 0.1–0.9)
MONO%: 2 % (ref 0.0–14.0)
NEUT#: 11.8 10*3/uL — ABNORMAL HIGH (ref 1.5–6.5)
NEUT%: 94.6 % — AB (ref 38.4–76.8)
PLATELETS: 151 10*3/uL (ref 145–400)
RBC: 3.76 10*6/uL (ref 3.70–5.45)
RDW: 25.5 % — ABNORMAL HIGH (ref 11.2–14.5)
WBC: 12.5 10*3/uL — ABNORMAL HIGH (ref 3.9–10.3)
lymph#: 0.4 10*3/uL — ABNORMAL LOW (ref 0.9–3.3)

## 2015-10-31 MED ORDER — LORAZEPAM 0.5 MG PO TABS: 0.5000 mg | ORAL_TABLET | Freq: Four times a day (QID) | ORAL | 0 refills | Status: DC | PRN

## 2015-10-31 MED ORDER — LEVETIRACETAM 500 MG PO TABS: 500.0000 mg | ORAL_TABLET | Freq: Two times a day (BID) | ORAL | 0 refills | Status: DC

## 2015-10-31 MED ORDER — MORPHINE SULFATE 15 MG PO TABS: 15.0000 mg | ORAL_TABLET | Freq: Four times a day (QID) | ORAL | 0 refills | Status: DC | PRN

## 2015-10-31 NOTE — Patient Instructions (Signed)

## 2015-10-31 NOTE — Telephone Encounter (Signed)
Pathology reports printed for [name] and given to her at appointment 10-31-15.

## 2015-10-31 NOTE — Progress Notes (Signed)
OFFICE PROGRESS NOTE   October 31, 2015   Physicians: E.Rossi, J.Kinard/ M. 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   Last chemotherapy was one cycle of gemzar 08-11-15. She has been on letrozole since 08-18-15. She had radiation to left chest thru 09-15-15 by Dr Sondra Come, then [facility] to cerebellar met by Drs Tammi Klippel and Vertell Limber on 10-07-15. She is on decadron taper per [facility] team, beginning 2 mg bid x 1 week today, then to go to 2 mg daily.   Patient and family report that she is doing fairly well at home, tho PS is ECOG 3 from history. She ambulates with assistance short distances in home, mostly stays on first floor tho ~ once daily family helps her get upstairs. She no longer notices dizziness, tho she is generally weak and particularly weak in proximal muscles consistent with steroids. She denies SOB with present activity, or cough. Appetite is excellent. No mucositis or esophagitis symptoms now. Bowels are moving. No fever on steroids, no symptoms of infection. She is able to sleep. No HA, no other neurologic symptoms. No bleeding. No LE swelling Remainder of 10 point Review of Systems negative  PAC placed by IR on 08-10-15, flushed 10-31-15 OnEliquis for LLE DVT  Genetics testing negative Breast Ovarian and gyn panel by [facility] 06-14-15.  CA 125 on 02-14-15 642.9 ER + on cytology 02-2015 Feraheme 02-16-15 and [id number]. Refuses flu vaccine, including to MD last visit and RN today.  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 She was lost to follow up for the gyn cancer after 11-2010, until presented with recurrent disease. Patient reported right inguinal mass for ~ 2 months, then LLE pain for ~ 3 weeks when she presented to ED on 02-12-15. CT AP showed nodules in lung bases suspicious for metastatic disease, liver not remarkable, no hydronephrosis, 6 mm right renal calculus, thrombus left external iliac vein into left common femoral vein, retroperitoneal and pelvic adenopathy, an 8.5 x 7.1 cm necrotic right inguinal node, post hyst ooph, no ascites, likely tumor involvement in left psoas with adjacent involvement of L4 and L5 vertebral bodies; CT chest subsequently had multiple bilateral pulmonary nodules and central adenopathy. Patient was begun on heparin qtt which was transitioned to Eliquis (on pharmaceutical assistance program). CA 125 from 02-14-15 was 642.9, this having been 3.6 in 08-2010. Pathology from right inguinal nodal mass aspiration and core needle biopsy 02-15-15 necrotic material with metastatic adenocarcinoma with immunostains consistent with gyn primary ([id number], ER+). She was transfused 2 units PRBCs for hgb 6.7. Iron studies 2-12 with serum iron 14 and %sat 7, given feraheme x2. Dr Denman George saw in consultation prior to starting carboplatin taxotere on 02-24-15. She reacted to [place] skin test prior to cycle 3, treatment continued with taxotere only thru cycle 5 on 05-26-15, tolerated neulasta poorly. She had I&D and capsulectomy of seroma cavity right inguinal region on 05-12-15. CT CAP 06-14-15 significant improvement in all areas of metastatic disease with exception of enlarging cystic mass in left hilum. Echocardiogram 07-19-15 done to  evaluate central chest mass: EF 60-65% and minimal pericardial effusion. PET 08-01-15 showedsignificant progression of multiple areas of disease in addition to the chest  involvement. She had first gemzar 08-11-15, then began letrozole on 08-18-15, due to concomitant chest radiation, ~ 37 cGy given thru ~ 09-14-15. She was found to have single left cerebellar metastasis 09-28-15, post SRS on 10-07-15.  Objective:  Vital signs in last 24 hours:  BP (!) 149/82 (BP Location: Left Arm, Patient Position: Sitting)   Pulse 100   Temp 98.3 F (36.8 C) (Oral)   Resp 18   Ht 5' 2"  (1.575 m)   SpO2 100%  Patient refused to stand to be weighed. Looks chronically ill but not in acute discomfort.  Alert, oriented and appropriate. In [facility] for office. Respirations not labored RA  HEENT: Cushingoid facies. PERRL, sclerae not icteric. Oral mucosa moist without lesions, posterior pharynx clear.  Neck supple. No JVD.  Lymphatics:no cervical,supraclavicular adenopathy. Did not undress to see inguinal areas, but nothing palpable right inguinal region thru clothing. Resp: no wheezes or rales, no use of accessory muscles, diminished breath sounds but present bilaterally Cardio: regular rate and rhythm. No gallop. GI: soft, nontender, not distended, no mass or organomegaly. Normally active bowel sounds. Musculoskeletal/ Extremities: without pitting edema, cords, tenderness Neuro: speech fluent and appropriate. Able to lift legs and hold up straight against gravity in WC. Sensation intact. No tremor.  Skin without rash, ecchymosis, petechiae Portacath-without erythema or tenderness  Lab Results:  Results for orders placed or performed in visit on 10/31/15  CBC with Differential  Result Value Ref Range   WBC 12.5 (H) 3.9 - 10.3 10e3/uL   NEUT# 11.8 (H) 1.5 - 6.5 10e3/uL   HGB 10.1 (L) 11.6 - 15.9 g/dL   HCT 29.6 (L) 34.8 - 46.6 %   Platelets 151 145 - 400 10e3/uL   MCV 78.7 (L) 79.5 - 101.0 fL   MCH 26.9 25.1 - 34.0 pg   MCHC 34.1 31.5 - 36.0 g/dL   RBC 3.76 3.70 - 5.45 10e6/uL   RDW 25.5 (H) 11.2 - 14.5 %   lymph# 0.4 (L) 0.9 - 3.3 10e3/uL   MONO# 0.3 0.1 - 0.9 10e3/uL    Eosinophils Absolute 0.0 0.0 - 0.5 10e3/uL   Basophils Absolute 0.0 0.0 - 0.1 10e3/uL   NEUT% 94.6 (H) 38.4 - 76.8 %   LYMPH% 3.4 (L) 14.0 - 49.7 %   MONO% 2.0 0.0 - 14.0 %   EOS% 0.0 0.0 - 7.0 %   BASO% 0.0 0.0 - 2.0 %   CMET with K 3.1, glu 132, creat 0.8, BUN 34, LFTs ok, Tprot 5.9, alb 3.0, Ca uncorrected 8.4  CA 125 available after visit 82.3, this having been 171 in 08-2015 at start of letrozole  Studies/Results:  No results found.  Medications: I have reviewed the patient's current medications. Steroid taper per [facility] team. Increase potassium to 20 mEq daily from present 10 mEq daily. No change in pain medication, MSIR refilled. Continue letrozole unless begin chemo Will continue keppra, begun in hospital with the cerebellar met (unless rad onc feels differently)  DISCUSSION RN also looked into [facility] PT/OT, confirmed information from hospital that her insurance will not cover for this indication.   Discussed use of pain medication. She should not use this for abdominal discomfort from constipation.  Interval history discussed. Discussed deconditioning and muscle weakness related to steroids. Discussed fact that individuals need to be well enough to tolerate chemotherapy or risk worsening  overall situation rather than getting benefit. Although at last visit patient and family had been adamant that chemo (gemzar)  be resumed, after this conversation she has stated that she does not want chemo "until I am stronger".  I have reminded them that she has been on letrozole since August, which is used as systemic treatment.   I did not address code status again today, difficult issue including at last visit.    Assessment/Plan:  1.metastatic adenocarcinoma of gyn primary, in patient with history of IC clear cell ovarian and IA high grade endometrial carcinomas 03-2008 (incomplete staging), post TAH BSO, adjuvant carboplatin taxol x 6 cycles and radiation. Lost to follow up after 11-2010  until presented with metastatic gyn cancer 02-2015. Initial response to [place] taxotere, then [place] reaction and progressive peripheral neuropathy with taxane. Extensive progression including left chest, gemzar x 1 8-10 -17 then held forradiation to left lung. Letrozole begun  for some coverage with radiation. Solitary left cerebellar met, post SRS 10-07-15. On steroid taper. Performance status ~ ECOG 3, agree with holding further chemo for present. CA 125 some lower today, possibly reflecting some response to the letrozole Post I&D with capsulectomy of seroma cavity 05-12-15, healed. adenocarcinoma in that path. 2.DNR rescinded during recent hospitalization. She has widely metastatic malignancy which is incurable by any medical treatment. All interventions are in palliative attempt from medical standpoint.  3.LLE DVT documented 02-12-15: on Eliquis, no longer symptomatic. No IVC filter. Eliquis approved thru [employer] assistance x 1 year.  4. Bradycardia and HTN after SRS, adjustments in antihypertensives during hospitalization then. Appreciate help from cardiology. 5. chemo nausea and vomiting improved with aloxi and protonix with prior regimen. PO intake better with decadron now 6.uncontrolled HTN at presentation with metastatic disease.  She has refused to get PCP. Now known to cardiology 7.Itching with morphine, nausea with dilaudid. Has not tried duragesic. 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 Stable or slightly progressive, feet >hands with additional taxane recently. 10.corrected calcium improved sinceIV bisphosphonate as zometa 08-18-15 11. hypoK : increase supplement and follow 12.overdue mammograms, general medical care, colonoscopy. Noncompliance with follow up after cancer diagnoses and adjuvant treatment after ~ 2012. Noncompliant with care for HTN over past several years, despite family efforts ( daughter is pediatrician) Needs  PCP but has not followed thru with referral to [facility] and Wellness clinic. 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 Repeat colonoscopy recommended 10-2009, apparently not done. Note iron deficiency. FOB not yet done, see above. 14.refuses flu vaccine.    All questions answered at time of visit, and they know to call if concerns prior to follow up. Messages to radiation oncology as she probably needs follow up with SRS in addition to recent visit to Dr Sondra Come. Time spent 30 min including >50% counseling and coordination of care.   Evlyn Clines, MD   10/31/2015, 12:09 PM

## 2015-11-01 DIAGNOSIS — Z95828 Presence of other vascular implants and grafts: Secondary | ICD-10-CM | POA: Insufficient documentation

## 2015-11-01 LAB — CA 125: Cancer Antigen (CA) 125: 82.3 U/mL — ABNORMAL HIGH (ref 0.0–38.1)

## 2015-11-04 ENCOUNTER — Ambulatory Visit: Payer: Medicaid Other

## 2015-11-08 ENCOUNTER — Telehealth: Payer: Self-pay | Admitting: *Deleted

## 2015-11-08 NOTE — Telephone Encounter (Signed)
CALLED PATIENT TO INFORM OF FU VISIT WITH ALISON PERKINS ON 11-29-15 @ 10 AM AND HER FU WITH DR. KINARD ON 01/12/16, SPOKE WITH PATIENT AND SHE IS AWARE OF THESE APPT. CARDS AND I ALSO MAILED THEM TO HER

## 2015-11-10 ENCOUNTER — Ambulatory Visit: Payer: Medicaid Other | Admitting: Radiation Oncology

## 2015-11-13 ENCOUNTER — Other Ambulatory Visit: Payer: Self-pay | Admitting: Oncology

## 2015-11-14 ENCOUNTER — Ambulatory Visit (HOSPITAL_BASED_OUTPATIENT_CLINIC_OR_DEPARTMENT_OTHER): Payer: Medicaid Other | Admitting: Oncology

## 2015-11-14 ENCOUNTER — Encounter: Payer: Self-pay | Admitting: Oncology

## 2015-11-14 ENCOUNTER — Ambulatory Visit (HOSPITAL_BASED_OUTPATIENT_CLINIC_OR_DEPARTMENT_OTHER): Payer: Medicaid Other

## 2015-11-14 ENCOUNTER — Other Ambulatory Visit (HOSPITAL_BASED_OUTPATIENT_CLINIC_OR_DEPARTMENT_OTHER): Payer: Medicaid Other

## 2015-11-14 VITALS — BP 144/71 | HR 98 | Temp 98.5°F | Resp 16 | Ht 62.0 in | Wt 154.1 lb

## 2015-11-14 DIAGNOSIS — Z8542 Personal history of malignant neoplasm of other parts of uterus: Secondary | ICD-10-CM | POA: Diagnosis not present

## 2015-11-14 DIAGNOSIS — C569 Malignant neoplasm of unspecified ovary: Secondary | ICD-10-CM

## 2015-11-14 DIAGNOSIS — C7931 Secondary malignant neoplasm of brain: Secondary | ICD-10-CM

## 2015-11-14 DIAGNOSIS — I82402 Acute embolism and thrombosis of unspecified deep veins of left lower extremity: Secondary | ICD-10-CM | POA: Diagnosis not present

## 2015-11-14 DIAGNOSIS — G620 Drug-induced polyneuropathy: Secondary | ICD-10-CM | POA: Diagnosis not present

## 2015-11-14 DIAGNOSIS — Z7901 Long term (current) use of anticoagulants: Secondary | ICD-10-CM

## 2015-11-14 DIAGNOSIS — C801 Malignant (primary) neoplasm, unspecified: Secondary | ICD-10-CM

## 2015-11-14 DIAGNOSIS — C7802 Secondary malignant neoplasm of left lung: Secondary | ICD-10-CM | POA: Diagnosis not present

## 2015-11-14 DIAGNOSIS — I10 Essential (primary) hypertension: Secondary | ICD-10-CM | POA: Diagnosis not present

## 2015-11-14 DIAGNOSIS — Z8543 Personal history of malignant neoplasm of ovary: Secondary | ICD-10-CM

## 2015-11-14 DIAGNOSIS — Z95828 Presence of other vascular implants and grafts: Secondary | ICD-10-CM

## 2015-11-14 DIAGNOSIS — E876 Hypokalemia: Secondary | ICD-10-CM | POA: Diagnosis not present

## 2015-11-14 DIAGNOSIS — I82502 Chronic embolism and thrombosis of unspecified deep veins of left lower extremity: Secondary | ICD-10-CM

## 2015-11-14 DIAGNOSIS — C541 Malignant neoplasm of endometrium: Secondary | ICD-10-CM

## 2015-11-14 DIAGNOSIS — C7800 Secondary malignant neoplasm of unspecified lung: Secondary | ICD-10-CM

## 2015-11-14 LAB — COMPREHENSIVE METABOLIC PANEL
ALT: 17 U/L (ref 0–55)
ANION GAP: 9 meq/L (ref 3–11)
AST: 9 U/L (ref 5–34)
Albumin: 2.7 g/dL — ABNORMAL LOW (ref 3.5–5.0)
Alkaline Phosphatase: 102 U/L (ref 40–150)
BILIRUBIN TOTAL: 0.28 mg/dL (ref 0.20–1.20)
BUN: 40.3 mg/dL — ABNORMAL HIGH (ref 7.0–26.0)
CO2: 26 meq/L (ref 22–29)
Calcium: 8.9 mg/dL (ref 8.4–10.4)
Chloride: 107 mEq/L (ref 98–109)
Creatinine: 0.7 mg/dL (ref 0.6–1.1)
GLUCOSE: 116 mg/dL (ref 70–140)
POTASSIUM: 3 meq/L — AB (ref 3.5–5.1)
SODIUM: 143 meq/L (ref 136–145)
Total Protein: 5.8 g/dL — ABNORMAL LOW (ref 6.4–8.3)

## 2015-11-14 LAB — CBC WITH DIFFERENTIAL/PLATELET
BASO%: 0.2 % (ref 0.0–2.0)
BASOS ABS: 0 10*3/uL (ref 0.0–0.1)
EOS ABS: 0 10*3/uL (ref 0.0–0.5)
EOS%: 0.1 % (ref 0.0–7.0)
HCT: 28.7 % — ABNORMAL LOW (ref 34.8–46.6)
HGB: 9.5 g/dL — ABNORMAL LOW (ref 11.6–15.9)
LYMPH%: 5.4 % — AB (ref 14.0–49.7)
MCH: 28.3 pg (ref 25.1–34.0)
MCHC: 33 g/dL (ref 31.5–36.0)
MCV: 85.9 fL (ref 79.5–101.0)
MONO#: 0.7 10*3/uL (ref 0.1–0.9)
MONO%: 7.8 % (ref 0.0–14.0)
NEUT#: 8.2 10*3/uL — ABNORMAL HIGH (ref 1.5–6.5)
NEUT%: 86.5 % — AB (ref 38.4–76.8)
PLATELETS: 186 10*3/uL (ref 145–400)
RBC: 3.34 10*6/uL — AB (ref 3.70–5.45)
RDW: 28.6 % — ABNORMAL HIGH (ref 11.2–14.5)
WBC: 9.5 10*3/uL (ref 3.9–10.3)
lymph#: 0.5 10*3/uL — ABNORMAL LOW (ref 0.9–3.3)

## 2015-11-14 LAB — TECHNOLOGIST REVIEW

## 2015-11-14 MED ORDER — APIXABAN 5 MG PO TABS: 5.0000 mg | ORAL_TABLET | Freq: Two times a day (BID) | ORAL | 1 refills | Status: DC

## 2015-11-14 MED ORDER — HEPARIN SOD (PORK) LOCK FLUSH 100 UNIT/ML IV SOLN
500.0000 [IU] | Freq: Once | INTRAVENOUS | Status: AC
  Administered 2015-11-14: 500 [IU] via INTRAVENOUS
  Filled 2015-11-14: qty 5

## 2015-11-14 MED ORDER — SODIUM CHLORIDE 0.9% FLUSH
10.0000 mL | INTRAVENOUS | Status: DC | PRN
  Administered 2015-11-14: 10 mL via INTRAVENOUS
  Filled 2015-11-14: qty 10

## 2015-11-14 NOTE — Progress Notes (Signed)
OFFICE PROGRESS NOTE   November 14, 2015   Physicians: E.Rossi, J.Kinard/ M. Manning/ J.Vertell Limber, C.Windham, P.Lucianne Lei Trigt Verl Blalock, [place] [facility]) Dorris Carnes  INTERVAL HISTORY:  Patient is seen, together with daughter, aunt, and another family member, in continuing attention to extensively metastatic endometrial carcinoma, on letrozole since 08-18-15. She had one cycle of gemzar prior to letrozole, that treatment change due to need for radiation to chest and then [facility] to cerebellar met in 10-2015.  She continues Eliquis for LLE DVT. She will see radiation oncology on 11-28 in follow up of [place] to cerebellar met. She has f/u with Dr Sondra Come Mary Sella for chest radiation.  Patient and family report that she has improved strength and exercise tolerance since she was here 2 weeks ago, now walking laps in the house and able to go up and down stairs, with family beside her but not assisting. Legs are stronger with the increased exercise. Appetite remains good. Bowels are moving better, which helped previous abdominal discomfort. She has needed no pain medication in 4-5 days, with no new or different pain. She denies SOB with this activity, no cough, no chest pain. No fever or symptoms of infection. She is able to sleep. No bleeding. No problems with PAC. No discomfort or drainage now right groin. No increased LE swelling.  Did not bring BP readings. Remainder of 10 point Review of Systems negative.    PAC placed by IR on 08-10-15, flushed 10-31-15 OnEliquis for LLE DVT  Genetics testing negative Breast Ovarian and gyn panel by [facility] 06-14-15.  CA 125 on 02-14-15 642.9 ER + on cytology 02-2015 Feraheme 02-16-15 and [id number]. 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 She was lost to follow up for the gyn cancer after 11-2010, until presented with recurrent disease. Patient reported right inguinal mass for ~ 2 months, then LLE pain for ~ 3 weeks when she presented to ED on 02-12-15. CT AP showed nodules in lung bases suspicious for metastatic disease, liver not remarkable, no hydronephrosis, 6 mm right renal calculus, thrombus left external iliac vein into left common femoral vein, retroperitoneal and pelvic adenopathy, an 8.5 x 7.1 cm necrotic right inguinal node, post hyst ooph, no ascites, likely tumor involvement in left psoas with adjacent involvement of L4 and L5 vertebral bodies; CT chest subsequently had multiple bilateral pulmonary nodules and central adenopathy. Patient was begun on heparin qtt which was transitioned to Eliquis (on pharmaceutical assistance program). CA 125 from 02-14-15 was 642.9, this having been 3.6 in 08-2010. Pathology from right inguinal nodal mass aspiration and core needle biopsy 02-15-15 necrotic material with metastatic adenocarcinoma with immunostains consistent with gyn primary ([id number], ER+). She was transfused 2 units PRBCs for hgb 6.7. Iron studies 2-12 with serum iron 14 and %sat 7, given feraheme x2. Dr Denman George saw in consultation prior to starting carboplatin taxotere on 02-24-15. She reacted to [place] skin test prior to cycle 3, treatment continued with taxotere only thru cycle 5 on 05-26-15, tolerated neulasta poorly. She had I&D and capsulectomy of seroma cavity right inguinal region on 05-12-15. CT CAP 06-14-15 significant improvement in all areas of metastatic disease with exception of enlarging cystic mass in left hilum. Echocardiogram 07-19-15 done to evaluate central  chest mass: EF 60-65% and minimal pericardial effusion. PET 08-01-15 showedsignificant progression of multiple areas of disease in addition to the chest  involvement. She had first gemzar 08-11-15, then began letrozole on 08-18-15, due to concomitant chest radiation, ~ 37 cGy given thru ~ 09-14-15. She was found to have single left cerebellar metastasis 09-28-15, post SRS on 10-07-15.  Objective:  Vital signs in last 24 hours:  BP (!) 144/71 (BP Location: Left Arm, Patient Position: Sitting) Comment: Made Nurse aware of elevated BP  Pulse 98   Temp 98.5 F (36.9 C) (Oral)   Resp 16   Ht 5' 2"  (1.575 m)   Wt 154 lb 1.6 oz (69.9 kg)   SpO2 100%   BMI 28.19 kg/m  Weight stable. Looks comfortable, smiling and easily conversant. Alert, oriented and appropriate. In [facility] for office. Respirations not labored RA. Family very supportive.  HEENT:PERRL, sclerae not icteric. Oral mucosa moist without lesions, no thrush,  posterior pharynx clear.  Neck supple. No JVD.  Lymphatics:no cervical,supraclavicular adenopathy Resp: breath sounds heard bilaterally to lower fields, no wheezes or rales Cardio: regular rate and rhythm. No gallop. GI: soft, nontender, not distended. Normally active bowel sounds.  Musculoskeletal/ Extremities: LE without pitting edema, cords, tenderness. Able to lift LE against gravity and hold them up extended. Right inguinal area with no tenderness, swelling or drainage. Hypopigmentation scattered there as previously. Neuro: speech fluent. Moves all extremities easily. No focal deficits on exam in [facility] Skin without rash, ecchymosis, petechiae Portacath-without erythema or tenderness  Lab Results:  Results for orders placed or performed in visit on 11/14/15  TECHNOLOGIST REVIEW  Result Value Ref Range   Technologist Review Metas and Myelocytes present   CBC with Differential  Result Value Ref Range   WBC 9.5 3.9 - 10.3 10e3/uL   NEUT# 8.2 (H) 1.5 - 6.5 10e3/uL   HGB 9.5 (L) 11.6 - 15.9 g/dL   HCT 28.7 (L) 34.8 - 46.6 %   Platelets 186 145 - 400 10e3/uL   MCV 85.9 79.5 - 101.0 fL   MCH 28.3 25.1 - 34.0 pg   MCHC 33.0 31.5 -  36.0 g/dL   RBC 3.34 (L) 3.70 - 5.45 10e6/uL   RDW 28.6 (H) 11.2 - 14.5 %   lymph# 0.5 (L) 0.9 - 3.3 10e3/uL   MONO# 0.7 0.1 - 0.9 10e3/uL   Eosinophils Absolute 0.0 0.0 - 0.5 10e3/uL   Basophils Absolute 0.0 0.0 - 0.1 10e3/uL   NEUT% 86.5 (H) 38.4 - 76.8 %   LYMPH% 5.4 (L) 14.0 - 49.7 %   MONO% 7.8 0.0 - 14.0 %   EOS% 0.1 0.0 - 7.0 %   BASO% 0.2 0.0 - 2.0 %  Comprehensive metabolic panel  Result Value Ref Range   Sodium 143 136 - 145 mEq/L   Potassium 3.0 (LL) 3.5 - 5.1 mEq/L   Chloride 107 98 - 109 mEq/L   CO2 26 22 - 29 mEq/L   Glucose 116 70 - 140 mg/dl   BUN 40.3 (H) 7.0 - 26.0 mg/dL   Creatinine 0.7 0.6 - 1.1 mg/dL   Total Bilirubin 0.28 0.20 - 1.20 mg/dL   Alkaline Phosphatase 102 40 - 150 U/L   AST 9 5 - 34 U/L   ALT 17 0 - 55 U/L   Total Protein 5.8 (L) 6.4 - 8.3 g/dL   Albumin 2.7 (L) 3.5 - 5.0 g/dL   Calcium 8.9 8.4 - 10.4 mg/dL   Anion Gap 9 3 - 11  mEq/L   EGFR >90 >90 ml/min/1.73 m2   CA 125 82 on 10-31-15, compared with 171 on 08-18-15 at start of letrozole.  Studies/Results:  No results found.  Medications: I have reviewed the patient's current medications. She is presently on KCl 20 mEq daily, will increase to bid Continue letrozole On decadron 1/4 of 4 mg tablet daily, per radiation oncology BP meds to be refilled by cardiology  DISCUSSION  Reviewed choice of continuing letrozole vs changing back to gemzar now. Patient and family are pleased with the progress she has made in last 2 weeks and prefer to continue letrozole for now, as she is tolerating the letrozole well and does not want possible chemo side effects now. Daughter, who is pediatrician, is fully in favor of continuing letrozole.   Discussed flu vaccine, which patient refuses "since a friend died after the vaccine". I have told her that flu vaccine is not a live virus, tho individuals who are allergic to eggs could have severe allergic reactions; all of family has had flu vaccine, as we had  recommended in absence of patient taking this. She may reconsider.  They are aware that a different medical oncologist will be taking over her care after first of year. I will see her again in ` 4 weeks, with lab and PAC flush then.   Assessment/Plan:  1.metastatic adenocarcinoma of gyn primary : continue letrozole in palliative attempt, extensive disease but looks better today. Follow up as above.   History of IC clear cell ovarian and IA high grade endometrial carcinomas 03-2008 (incomplete staging), post TAH BSO, adjuvant carboplatin taxol x 6 cycles and radiation. Lost to follow up 11-2010 until presented with metastatic gyn cancer 02-2015 ,ER + cytology. Initial response to [place] taxotere, then [place] reaction and progressive peripheral neuropathy with taxane. Extensive progression including left chest, gemzar x 1 given 8-10 -17 then held forradiation to left lung. Letrozole begun 08-18-15 for some coverage with radiation. Solitary left cerebellar met, post SRS 10-07-15, on steroid taper.  Post I&D with capsulectomy of seroma cavity 05-12-15, healed. adenocarcinoma in that path. Genetics testing negative 2.DNR RESCINDED during recent hospitalization. She has widely metastatic malignancy which is incurable, all interventions are in palliative attempt from medical standpoint. Patient and family have refused further discussion at recent visits. 3. Extensive LLE DVT documented 02-12-15: no longer symptomatic, on Eliquis. No IVC filter. Eliquis approved thru [employer] assistance x 1 year.  4. Bradycardia and HTN after SRS, adjustments in antihypertensives during hospitalization then. Uncontrolled HTN at presentation, has refused to get PCP. Appreciate help from Dr Harrington Challenger 5. chemo nausea and vomiting improved with aloxi and protonix with prior regimen. PO intake better even with decadron taper now 6 .Itching with morphine, nausea with dilaudid. Has not triedduragesic. 7.PAC 8..chemo peripheral  neuropathy related to previous taxol, thus choice of taxotere. Stable or slightly progressive, feet >hands with additional taxane recently. 9.corrected calcium improved sinceIV bisphosphonate as zometa 08-18-15 10. hypoK : on HCTZ,  increase to 20 mEq bid and follow 11.overdue mammograms (not priority now with metastatic cancer), general medical care, colonoscopy. Noncompliance with follow up after cancer diagnoses and adjuvant treatment after ~ 2012. Noncompliant with care for HTN over past several years, despite family efforts ( daughter is pediatrician) Needs PCP but has not followed thru with referral to [facility] and Wellness clinic. 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 Repeat colonoscopy recommended 10-2009, apparently not done. Note iron deficiency, post feraheme x 2.. FOB not yet done despite multiple  requests for this 13.refuses flu vaccine: tho listened to discussion today and may reconsider.    All questions answered and they know to call if needed prior to next scheduled visit. Time spent 25 min including >50% counseling and coordination of care.    Evlyn Clines, MD   11/14/2015, 5:38 PM

## 2015-11-14 NOTE — Progress Notes (Signed)
   11-14-2015  To Whom It May Concern:  Daci E Fugere is receiving Eliquis thru patient assistance program with [employer]. Patient moved to a new address, but had not notified Secretary such that last shipment of Eliquis was delivered to the previous address and not received by patient.  If possible, could she obtain another shipment to correct address in order to replace the shipment that she did not receive? Correct address:  [address], [place]   [place]   Thank you for your help, SIncerely,     Koichi Platte P.Marko Plume MD

## 2015-11-15 ENCOUNTER — Encounter: Payer: Self-pay | Admitting: Cardiology

## 2015-11-16 ENCOUNTER — Encounter: Payer: Self-pay | Admitting: Oncology

## 2015-11-16 ENCOUNTER — Other Ambulatory Visit: Payer: Self-pay | Admitting: *Deleted

## 2015-11-16 MED ORDER — HYDROCHLOROTHIAZIDE 25 MG PO TABS: 12.5000 mg | ORAL_TABLET | Freq: Every day | ORAL | 0 refills | Status: DC

## 2015-11-17 ENCOUNTER — Other Ambulatory Visit: Payer: Self-pay | Admitting: Radiation Therapy

## 2015-11-17 DIAGNOSIS — C7931 Secondary malignant neoplasm of brain: Secondary | ICD-10-CM

## 2015-11-17 DIAGNOSIS — C7949 Secondary malignant neoplasm of other parts of nervous system: Principal | ICD-10-CM

## 2015-11-22 ENCOUNTER — Telehealth: Payer: Self-pay | Admitting: Radiation Therapy

## 2015-11-22 NOTE — Telephone Encounter (Signed)
[name] called to report onset of headaches since tapering off of her steroids. Last dose was 11/19, she reported that her dose at that time was one fourth of a 4 mg tablet  (1mg ) once a day. She was on that for 1 week and before then took half of a 4mg  tab (2mg ) once daily for one week.   [name] is scheduled for a follow-up with Dr. Sondra Come for her chest on 12/4 and then a  MRI on 1/4 and to see Bryson Ha for follow-up on 1/8.   She would like a call back with recommendations of what to do for her headache. Preferred Pharmacy is the [facility] in the [employer] on [address]. I asked that her daughter look into [name]'s mouth for any white patches on her tongue or the inside of her gums. None were seen.      Mont Dutton R.T.(R)(T) Special Procedures Navigator

## 2015-11-23 ENCOUNTER — Telehealth: Payer: Self-pay | Admitting: Radiation Therapy

## 2015-11-23 ENCOUNTER — Other Ambulatory Visit: Payer: Self-pay | Admitting: Radiation Oncology

## 2015-11-23 ENCOUNTER — Telehealth: Payer: Self-pay | Admitting: Radiation Oncology

## 2015-11-23 MED ORDER — DEXAMETHASONE 2 MG PO TABS: 1.0000 mg | ORAL_TABLET | Freq: Two times a day (BID) | ORAL | 0 refills | Status: DC

## 2015-11-23 NOTE — Telephone Encounter (Signed)
In response to the new symptoms of headache and dizziness after completing the steroid taper, Shona Simpson PA, has recommended that [name] restart the steroid,  taking 1mg  bid for 1 week. The plan is to check back in with her on Wed 11/29 to see if her symptoms are better. At that time her symptoms and steroid dose will be reassessed.   Ms. Kostiuk is aware that the [facility] department will be closed Thursday and Friday for the Thanksgiving Holiday. I have instructed her to call the main [phone] line if she needs anything during this time. She is happy with this plan.  Mont Dutton R.T.(R)(T) Special Procedures Navigator

## 2015-11-23 NOTE — Telephone Encounter (Signed)
-----   Message from Pincus Large sent at 11/23/2015  2:00 PM EST ----- Regarding: pt needs a steroid RX- she is out  Shavana was given the instructions to restart her steroid today. She was told 1 mg bid. She will need a new [facility] the [employer] on [address]. She is completely out.  Thank you, [name]

## 2015-11-23 NOTE — Telephone Encounter (Signed)
Phoned patient at home. Explained a refill of her Decadron has been e scribed to [facility] [employer] on [address]. She verbalized understanding and expressed appreciation for the call.

## 2015-11-29 ENCOUNTER — Other Ambulatory Visit: Payer: Self-pay | Admitting: Oncology

## 2015-11-29 ENCOUNTER — Ambulatory Visit: Payer: Medicaid Other | Admitting: Radiation Oncology

## 2015-11-29 DIAGNOSIS — C541 Malignant neoplasm of endometrium: Secondary | ICD-10-CM

## 2015-11-30 MED ORDER — LEVETIRACETAM 500 MG PO TABS
500.0000 mg | ORAL_TABLET | Freq: Two times a day (BID) | ORAL | 1 refills | Status: DC
Start: 2015-11-30 — End: 2016-01-30

## 2015-11-30 MED ORDER — LEVETIRACETAM 500 MG PO TABS: 500.0000 mg | ORAL_TABLET | Freq: Two times a day (BID) | ORAL | 1 refills | Status: DC

## 2015-11-30 MED ORDER — PANTOPRAZOLE SODIUM 40 MG PO TBEC: 40.0000 mg | DELAYED_RELEASE_TABLET | Freq: Every day | ORAL | 2 refills | Status: DC

## 2015-11-30 NOTE — Telephone Encounter (Signed)
Medication refill sent to the pharmacy  

## 2015-12-01 ENCOUNTER — Encounter: Payer: Self-pay | Admitting: Nurse Practitioner

## 2015-12-05 ENCOUNTER — Ambulatory Visit: Payer: Medicaid Other | Attending: Radiation Oncology | Admitting: Radiation Oncology

## 2015-12-06 ENCOUNTER — Telehealth: Payer: Self-pay | Admitting: Radiation Therapy

## 2015-12-06 NOTE — Telephone Encounter (Signed)
I called to instruct patient to decrease decadron to 1mg  daily per Alison's instruction.The patient did not answer, so I left a voicemail with these instructions and asked her to call me back to ensure that she received the message.  The plan is to call again later in the week to see how she is doing with this change.  Mont Dutton R.T.(R)(T) Special Procedures Navigator [phone]

## 2015-12-09 ENCOUNTER — Encounter: Payer: Self-pay | Admitting: Oncology

## 2015-12-09 ENCOUNTER — Other Ambulatory Visit: Payer: Self-pay

## 2015-12-09 DIAGNOSIS — E876 Hypokalemia: Secondary | ICD-10-CM

## 2015-12-09 MED ORDER — POTASSIUM CHLORIDE ER 10 MEQ PO TBCR: 10.0000 meq | EXTENDED_RELEASE_TABLET | Freq: Every day | ORAL | 1 refills | Status: DC

## 2015-12-11 ENCOUNTER — Other Ambulatory Visit: Payer: Self-pay | Admitting: Oncology

## 2015-12-11 DIAGNOSIS — C541 Malignant neoplasm of endometrium: Secondary | ICD-10-CM

## 2015-12-12 ENCOUNTER — Ambulatory Visit (HOSPITAL_BASED_OUTPATIENT_CLINIC_OR_DEPARTMENT_OTHER): Payer: Medicaid Other

## 2015-12-12 ENCOUNTER — Ambulatory Visit (HOSPITAL_BASED_OUTPATIENT_CLINIC_OR_DEPARTMENT_OTHER): Payer: Medicaid Other | Admitting: Oncology

## 2015-12-12 ENCOUNTER — Other Ambulatory Visit (HOSPITAL_BASED_OUTPATIENT_CLINIC_OR_DEPARTMENT_OTHER): Payer: Medicaid Other

## 2015-12-12 ENCOUNTER — Other Ambulatory Visit: Payer: Self-pay

## 2015-12-12 VITALS — BP 142/78 | HR 98 | Temp 98.3°F | Resp 18 | Ht 62.0 in | Wt 164.5 lb

## 2015-12-12 DIAGNOSIS — C7931 Secondary malignant neoplasm of brain: Secondary | ICD-10-CM

## 2015-12-12 DIAGNOSIS — E876 Hypokalemia: Secondary | ICD-10-CM

## 2015-12-12 DIAGNOSIS — I10 Essential (primary) hypertension: Secondary | ICD-10-CM

## 2015-12-12 DIAGNOSIS — C7802 Secondary malignant neoplasm of left lung: Secondary | ICD-10-CM | POA: Diagnosis not present

## 2015-12-12 DIAGNOSIS — G620 Drug-induced polyneuropathy: Secondary | ICD-10-CM

## 2015-12-12 DIAGNOSIS — C801 Malignant (primary) neoplasm, unspecified: Secondary | ICD-10-CM

## 2015-12-12 DIAGNOSIS — Z8542 Personal history of malignant neoplasm of other parts of uterus: Secondary | ICD-10-CM | POA: Diagnosis not present

## 2015-12-12 DIAGNOSIS — Z8543 Personal history of malignant neoplasm of ovary: Secondary | ICD-10-CM | POA: Diagnosis not present

## 2015-12-12 DIAGNOSIS — C541 Malignant neoplasm of endometrium: Secondary | ICD-10-CM

## 2015-12-12 DIAGNOSIS — Z7901 Long term (current) use of anticoagulants: Secondary | ICD-10-CM

## 2015-12-12 DIAGNOSIS — C7800 Secondary malignant neoplasm of unspecified lung: Secondary | ICD-10-CM

## 2015-12-12 DIAGNOSIS — C569 Malignant neoplasm of unspecified ovary: Secondary | ICD-10-CM

## 2015-12-12 DIAGNOSIS — I82402 Acute embolism and thrombosis of unspecified deep veins of left lower extremity: Secondary | ICD-10-CM | POA: Diagnosis not present

## 2015-12-12 DIAGNOSIS — Z95828 Presence of other vascular implants and grafts: Secondary | ICD-10-CM

## 2015-12-12 LAB — COMPREHENSIVE METABOLIC PANEL
ALT: 11 U/L (ref 0–55)
ANION GAP: 11 meq/L (ref 3–11)
AST: 10 U/L (ref 5–34)
Albumin: 2.7 g/dL — ABNORMAL LOW (ref 3.5–5.0)
Alkaline Phosphatase: 101 U/L (ref 40–150)
BILIRUBIN TOTAL: 0.25 mg/dL (ref 0.20–1.20)
BUN: 20 mg/dL (ref 7.0–26.0)
CALCIUM: 9.5 mg/dL (ref 8.4–10.4)
CO2: 26 meq/L (ref 22–29)
CREATININE: 0.7 mg/dL (ref 0.6–1.1)
Chloride: 105 mEq/L (ref 98–109)
Glucose: 102 mg/dl (ref 70–140)
Potassium: 2.9 mEq/L — CL (ref 3.5–5.1)
Sodium: 142 mEq/L (ref 136–145)
TOTAL PROTEIN: 6.2 g/dL — AB (ref 6.4–8.3)

## 2015-12-12 LAB — CBC WITH DIFFERENTIAL/PLATELET
BASO%: 0.3 % (ref 0.0–2.0)
Basophils Absolute: 0 10*3/uL (ref 0.0–0.1)
EOS ABS: 0.1 10*3/uL (ref 0.0–0.5)
EOS%: 0.4 % (ref 0.0–7.0)
HEMATOCRIT: 29 % — AB (ref 34.8–46.6)
HGB: 9.6 g/dL — ABNORMAL LOW (ref 11.6–15.9)
LYMPH#: 0.7 10*3/uL — AB (ref 0.9–3.3)
LYMPH%: 6.4 % — AB (ref 14.0–49.7)
MCH: 28.8 pg (ref 25.1–34.0)
MCHC: 33.1 g/dL (ref 31.5–36.0)
MCV: 87.1 fL (ref 79.5–101.0)
MONO#: 0.8 10*3/uL (ref 0.1–0.9)
MONO%: 7 % (ref 0.0–14.0)
NEUT#: 9.6 10*3/uL — ABNORMAL HIGH (ref 1.5–6.5)
NEUT%: 85.9 % — AB (ref 38.4–76.8)
PLATELETS: 260 10*3/uL (ref 145–400)
RBC: 3.33 10*6/uL — AB (ref 3.70–5.45)
RDW: 20.6 % — ABNORMAL HIGH (ref 11.2–14.5)
WBC: 11.2 10*3/uL — ABNORMAL HIGH (ref 3.9–10.3)

## 2015-12-12 LAB — MAGNESIUM: MAGNESIUM: 1.7 mg/dL (ref 1.5–2.5)

## 2015-12-12 MED ORDER — SODIUM CHLORIDE 0.9% FLUSH
10.0000 mL | INTRAVENOUS | Status: DC | PRN
  Administered 2015-12-12: 10 mL via INTRAVENOUS
  Filled 2015-12-12: qty 10

## 2015-12-12 MED ORDER — HEPARIN SOD (PORK) LOCK FLUSH 100 UNIT/ML IV SOLN
500.0000 [IU] | Freq: Once | INTRAVENOUS | Status: AC | PRN
  Administered 2015-12-12: 500 [IU] via INTRAVENOUS
  Filled 2015-12-12: qty 5

## 2015-12-12 MED ORDER — POTASSIUM CHLORIDE ER 10 MEQ PO TBCR: EXTENDED_RELEASE_TABLET | ORAL | 0 refills | Status: DC

## 2015-12-12 NOTE — Progress Notes (Signed)
OFFICE PROGRESS NOTE   December 12, 2015   Physicians:  E.Rossi, J.Kinard/ M. Manning/ J.Vertell Limber, C.Windham, P.Lucianne Lei Trigt Verl Blalock, [place] [facility]) Dorris Carnes  INTERVAL HISTORY:  Patient is seen, together with daughter and another family member, in continuing attention to metastatic endometrial carcinoma involving brain, lungs, chest and other areas. She has been on letrozole since 08-18-15. Most recent imaging was CT head 10-07-15; last CT CAP 09-23-15.  She has follow up with radiation oncology 01-09-16.  Patient had increased HA in late Nov, with decadron increased then by radiation oncology and HA resolved. The decadron has been tapered to present 1 mg daily, with no further taper instructions as of now. Patient reports that she is otherwise feeling well. She denies increased SOB, chest pain or other pain. Appetite is excellent on the steroids, no mucositis symptoms, no N/V. Bowels ok. No bleeding on the Eliquis. No LE swelling. No discomfort or swelling in right inguinal area now. She is able to go up and down stairs in home. She reports compliance with medications. Hair is growing back. No problems with PAC. No fever or steroids. Remainder of 10 point Review of Systems negative.  PAC placed by IR on 08-10-15, flushed 12-12-15 OnEliquis for LLE DVT  Genetics testing negative Breast Ovarian and gyn panel by [facility] 06-14-15.  CA 125 on 02-14-15 642.9 ER + on cytology 02-2015 Feraheme 02-16-15 and [id number]. Refuses flu vaccine  ONCOLOGIC HISTORY 03-2008 technically unstaged at least IC clear cell ovarian carcinoma and synchronous at least IC high grade endometrial carcinoma treated with TAH BSO and omental biopsy, then adjuvant carboplatin taxol x 6 cycles completed 08-2008 and pelvic radiation + vaginal brachytherapy completed 12-2008.Chemo was complicated by residual peripheral neuropathy. She was lost to follow up for the gyn cancer after 11-2010, until presented with recurrent  disease. Patient reported right inguinal mass for ~ 2 months, then LLE pain for ~ 3 weeks when she presented to ED on 02-12-15. CT AP showed nodules in lung bases suspicious for metastatic disease, liver not remarkable, no hydronephrosis, 6 mm right renal calculus, thrombus left external iliac vein into left common femoral vein, retroperitoneal and pelvic adenopathy, an 8.5 x 7.1 cm necrotic right inguinal node, post hyst ooph, no ascites, likely tumor involvement in left psoas with adjacent involvement of L4 and L5 vertebral bodies; CT chest subsequently had multiple bilateral pulmonary nodules and central adenopathy. Patient was begun on heparin qtt which was transitioned to Eliquis (on pharmaceutical assistance program). CA 125 from 02-14-15 was 642.9, this having been 3.6 in 08-2010. Pathology from right inguinal nodal mass aspiration and core needle biopsy 02-15-15 necrotic material with metastatic adenocarcinoma with immunostains consistent with gyn primary ([id number], ER+). She was transfused 2 units PRBCs for hgb 6.7. Iron studies 2-12 with serum iron 14 and %sat 7, given feraheme x2. Dr Denman George saw in consultation prior to starting carboplatin taxotere on 02-24-15. She reacted to [place] skin test prior to cycle 3, treatment continued with taxotere only thru cycle 5 on 05-26-15, tolerated neulasta poorly. She had I&D and capsulectomy of seroma cavity right inguinal region on 05-12-15. CT CAP 06-14-15 significant improvement in all areas of metastatic disease with exception of enlarging cystic mass in left hilum. Echocardiogram 07-19-15 done to evaluate central chest mass: EF 60-65% and minimal pericardial effusion. PET 08-01-15 showedsignificant progression of multiple areas of disease in addition to the chest involvement. She had first gemzar 08-11-15, then began letrozole on 08-18-15, due to concomitant chest radiation, ~ 37 cGy  given thru ~ 09-14-15. She was found to have single left cerebellar metastasis 09-28-15,  post SRS on 10-07-15.  Objective:  Vital signs in last 24 hours:  BP (!) 142/78 (BP Location: Left Arm, Patient Position: Sitting)   Pulse 98   Temp 98.3 F (36.8 C) (Oral)   Resp 18   Ht 5' 2"  (1.575 m)   Wt 164 lb 8 oz (74.6 kg)   SpO2 100%   BMI 30.09 kg/m  Weight up 10 lbs. Alert, oriented and appropriate. Appears comfortable seated in WC, repositions without assistance, respirations not labored RA.  No alopecia  HEENT:PERRL, sclerae not icteric. Oral mucosa moist without lesions, posterior pharynx clear.  Neck supple. No JVD.  Lymphatics:no cervical,supraclavicular adenopathy Resp: Decreased BS thru left lung, no wheezes or rales, clear on right. Cardio: regular rate and rhythm. No gallop. GI: soft, nontender including epigastrium, not distended, no mass or organomegaly. Normally active bowel sounds.  Musculoskeletal/ Extremities: LE without pitting edema, cords, tenderness Neuro: speech fluent. Moves all extremities. Skin without rash, ecchymosis, petechiae Portacath-without erythema or tenderness  Lab Results:  Results for orders placed or performed in visit on 12/12/15  CBC with Differential  Result Value Ref Range   WBC 11.2 (H) 3.9 - 10.3 10e3/uL   NEUT# 9.6 (H) 1.5 - 6.5 10e3/uL   HGB 9.6 (L) 11.6 - 15.9 g/dL   HCT 29.0 (L) 34.8 - 46.6 %   Platelets 260 145 - 400 10e3/uL   MCV 87.1 79.5 - 101.0 fL   MCH 28.8 25.1 - 34.0 pg   MCHC 33.1 31.5 - 36.0 g/dL   RBC 3.33 (L) 3.70 - 5.45 10e6/uL   RDW 20.6 (H) 11.2 - 14.5 %   lymph# 0.7 (L) 0.9 - 3.3 10e3/uL   MONO# 0.8 0.1 - 0.9 10e3/uL   Eosinophils Absolute 0.1 0.0 - 0.5 10e3/uL   Basophils Absolute 0.0 0.0 - 0.1 10e3/uL   NEUT% 85.9 (H) 38.4 - 76.8 %   LYMPH% 6.4 (L) 14.0 - 49.7 %   MONO% 7.0 0.0 - 14.0 %   EOS% 0.4 0.0 - 7.0 %   BASO% 0.3 0.0 - 2.0 %  Comprehensive metabolic panel  Result Value Ref Range   Sodium 142 136 - 145 mEq/L   Potassium 2.9 (LL) 3.5 - 5.1 mEq/L   Chloride 105 98 - 109 mEq/L    CO2 26 22 - 29 mEq/L   Glucose 102 70 - 140 mg/dl   BUN 20.0 7.0 - 26.0 mg/dL   Creatinine 0.7 0.6 - 1.1 mg/dL   Total Bilirubin 0.25 0.20 - 1.20 mg/dL   Alkaline Phosphatase 101 40 - 150 U/L   AST 10 5 - 34 U/L   ALT 11 0 - 55 U/L   Total Protein 6.2 (L) 6.4 - 8.3 g/dL   Albumin 2.7 (L) 3.5 - 5.0 g/dL   Calcium 9.5 8.4 - 10.4 mg/dL   Anion Gap 11 3 - 11 mEq/L   EGFR >90 >90 ml/min/1.73 m2   CA 125 available after visit 92.5, having been 82 on 10-31-15 and 171 on 08-18-15  Studies/Results:  No results found.  Medications: I have reviewed the patient's current medications. Patient and daughter state that she has been taking KCl 20 mEq daily (tho last visit had tried to increase to bid), has not had dose this AM.  Will increase K to 20 mEq 3 doses today then 20 mEq bid  Stop HCTZ. If need to resume due to BP, would  use maxzide. Continue letrozole Decadron per radiation oncology.  DISCUSSION Medications as above Repeat BMET next week, may be able to decrease K then as stopping HCTZ Patient and family are pleased that she is feeling better and want to continue letrozole Will see her again with labs coordinating with PAC flush in Jan    Assessment/Plan:  1.metastatic adenocarcinoma of gyn primary : continue letrozole in palliative attempt, extensively metastatic disease but radiation and other interventions have been helpful with symptoms. Follow up as above.   History of IC clear cell ovarian and IA high grade endometrial carcinomas 03-2008 (incomplete staging), post TAH BSO, adjuvant carboplatin taxol x 6 cycles and radiation. Lost to follow up 11-2010 until presented with metastatic gyn cancer 02-2015 ,ER + cytology. Initial response to [place] taxotere, then [place] reaction and progressive peripheral neuropathy with taxane. Extensive progression including left chest, gemzar x 1 given 8-10 -17 then held forradiation to left lung. Letrozole begun 08-18-15. Solitary left cerebellar  met, post SRS 10-07-15, on steroid taper.  Post I&D with capsulectomy of seroma cavity 05-12-15, healed. adenocarcinoma in that path. Genetics testing negative 2.DNR RESCINDED during last hospitalization. She has widely metastatic malignancy which is incurable, all interventions are in palliative attempt from medical standpoint. Patient and family have refused further discussion at recent visits. 3. Extensive LLE DVT documented 02-12-15: no longer symptomatic, on Eliquis. No IVC filter. Eliquis approved thru [employer] assistance x 1 year.  4. Bradycardia and HTN after SRS, adjustments in antihypertensives during hospitalization then. Uncontrolled HTN at presentation, has refused to get PCP. Appreciate help from Dr Harrington Challenger 5. chemo nausea and vomiting improved with aloxi and protonix with prior regimen. PO now excellent on steroids 6 .Itching with morphine, nausea with dilaudid. Has not triedduragesic. 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 Stable or slightly progressive, feet >hands with additional taxane recently. 9.corrected calcium improved sinceIV bisphosphonate as zometa 08-18-15 10. hypoK : despite KCl probably 20 mEq daily. Hold HCTZ, increase K as above, recheck next week. 11.overdue mammograms (not priority now with metastatic cancer), general medical care, colonoscopy. Noncompliance with follow up after cancer diagnoses and adjuvant treatment after ~ 2012. 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 Repeat colonoscopy recommended 10-2009, apparently not done. Note iron deficiency, post feraheme x 2.. FOB not yet done despite multiple requests for this 13.refuses flu vaccine   All questions answered and patient/ family are in agreement with recommendations and plans above. Time  spent 25 min including >50% counseling and coordination of care. Cc A.Perkins/ M.Erich Montane, MD   12/12/2015, 11:46 AM

## 2015-12-13 ENCOUNTER — Encounter: Payer: Self-pay | Admitting: Oncology

## 2015-12-13 ENCOUNTER — Other Ambulatory Visit: Payer: Self-pay

## 2015-12-13 DIAGNOSIS — C541 Malignant neoplasm of endometrium: Secondary | ICD-10-CM

## 2015-12-13 LAB — CA 125: Cancer Antigen (CA) 125: 92.5 U/mL — ABNORMAL HIGH (ref 0.0–38.1)

## 2015-12-13 MED ORDER — ZOLPIDEM TARTRATE 5 MG PO TABS: 5.0000 mg | ORAL_TABLET | Freq: Every evening | ORAL | 1 refills | Status: DC | PRN

## 2015-12-14 ENCOUNTER — Telehealth: Payer: Self-pay

## 2015-12-14 NOTE — Telephone Encounter (Signed)
lvm per Dr Livesay's attached message. 

## 2015-12-14 NOTE — Telephone Encounter (Signed)
-----   Message from Gordy Levan, MD sent at 12/13/2015  8:17 AM EST ----- Labs seen and need follow up: can tell her these labs when we follow up the repeat K next week, or now.  Magnesium is in good range, so is not adding to the low K now, and marker probably no significant change at 92 thanks

## 2015-12-19 ENCOUNTER — Other Ambulatory Visit (HOSPITAL_BASED_OUTPATIENT_CLINIC_OR_DEPARTMENT_OTHER): Payer: Medicaid Other

## 2015-12-19 DIAGNOSIS — E876 Hypokalemia: Secondary | ICD-10-CM | POA: Diagnosis not present

## 2015-12-19 LAB — BASIC METABOLIC PANEL
ANION GAP: 8 meq/L (ref 3–11)
BUN: 20.6 mg/dL (ref 7.0–26.0)
CO2: 24 mEq/L (ref 22–29)
Calcium: 9.5 mg/dL (ref 8.4–10.4)
Chloride: 109 mEq/L (ref 98–109)
Creatinine: 0.7 mg/dL (ref 0.6–1.1)
EGFR: >90 mL/min/{1.73_m2} (ref >90)
GLUCOSE: 101 mg/dL (ref 70–140)
POTASSIUM: 3.9 meq/L (ref 3.5–5.1)
Sodium: 142 mEq/L (ref 136–145)

## 2015-12-20 ENCOUNTER — Telehealth: Payer: Self-pay

## 2015-12-20 NOTE — Telephone Encounter (Signed)
Told [name] that her KCL was up to 3.9 yesterday 12-19-15 Patient's BP off HCTZ was 132/66 and 119/60. Told her that Dr. Marko Plume said that if BP good that she can stay off the HCTZ and take 20 meq daily of KCL. Labs to be repeated 01-26-16 at  visit with Dr. Marko Plume.

## 2015-12-20 NOTE — Telephone Encounter (Signed)
-----   Message from Gordy Levan, MD sent at 12/12/2015 12:08 PM EST ----- Regarding: lab 12-18 BMET12-18 Need to give her directions on K  K on 12-11 was 2.9. Stopped HCTZ. Increased K from 20 mEq daily to 20 mEq bid (10 mEq tablets)  If BP too high off of HCTZ, should change to maxzide 25 and will need to back down on K if so  thanks

## 2015-12-27 ENCOUNTER — Telehealth: Payer: Self-pay

## 2015-12-27 NOTE — Telephone Encounter (Signed)
Pt c/o leg pain in left leg with swelling in place she had a clot before. It is hurting and she had chest pain last night. Instructed pt to go to ER to rule out blood clots in legs or chest. Asked her to keep [place] informed.

## 2016-01-03 ENCOUNTER — Emergency Department (HOSPITAL_COMMUNITY)
Admission: EM | Admit: 2016-01-03 | Discharge: 2016-01-03 | Disposition: A | Discharge status: Home / Self Care | Payer: Medicaid Other | Attending: Emergency Medicine | Admitting: Emergency Medicine

## 2016-01-03 ENCOUNTER — Encounter (HOSPITAL_COMMUNITY): Payer: Self-pay | Admitting: Emergency Medicine

## 2016-01-03 ENCOUNTER — Emergency Department (HOSPITAL_COMMUNITY): Payer: Medicaid Other

## 2016-01-03 DIAGNOSIS — Z85841 Personal history of malignant neoplasm of brain: Secondary | ICD-10-CM | POA: Diagnosis not present

## 2016-01-03 DIAGNOSIS — Z8542 Personal history of malignant neoplasm of other parts of uterus: Secondary | ICD-10-CM | POA: Diagnosis not present

## 2016-01-03 DIAGNOSIS — Z8543 Personal history of malignant neoplasm of ovary: Secondary | ICD-10-CM | POA: Diagnosis not present

## 2016-01-03 DIAGNOSIS — J069 Acute upper respiratory infection, unspecified: Secondary | ICD-10-CM | POA: Diagnosis not present

## 2016-01-03 DIAGNOSIS — R0602 Shortness of breath: Secondary | ICD-10-CM | POA: Diagnosis present

## 2016-01-03 DIAGNOSIS — I10 Essential (primary) hypertension: Secondary | ICD-10-CM | POA: Insufficient documentation

## 2016-01-03 DIAGNOSIS — Z7901 Long term (current) use of anticoagulants: Secondary | ICD-10-CM | POA: Diagnosis not present

## 2016-01-03 LAB — COMPREHENSIVE METABOLIC PANEL
ALBUMIN: 3.1 g/dL — AB (ref 3.5–5.0)
ALT: 37 U/L (ref 14–54)
AST: 31 U/L (ref 15–41)
Alkaline Phosphatase: 100 U/L (ref 38–126)
Anion gap: 9 (ref 5–15)
BILIRUBIN TOTAL: 0.4 mg/dL (ref 0.3–1.2)
BUN: 14 mg/dL (ref 6–20)
CO2: 26 mmol/L (ref 22–32)
CREATININE: 0.65 mg/dL (ref 0.44–1.00)
Calcium: 9.1 mg/dL (ref 8.9–10.3)
Chloride: 105 mmol/L (ref 101–111)
GFR calc Af Amer: >60 mL/min (ref >60)
GLUCOSE: 126 mg/dL — AB (ref 65–99)
POTASSIUM: 3.5 mmol/L (ref 3.5–5.1)
Sodium: 140 mmol/L (ref 135–145)
TOTAL PROTEIN: 6.8 g/dL (ref 6.5–8.1)

## 2016-01-03 LAB — CBC
HEMATOCRIT: 28.1 % — AB (ref 36.0–46.0)
HEMOGLOBIN: 9.2 g/dL — AB (ref 12.0–15.0)
MCH: 27.9 pg (ref 26.0–34.0)
MCHC: 32.7 g/dL (ref 30.0–36.0)
MCV: 85.2 fL (ref 78.0–100.0)
Platelets: 363 10*3/uL (ref 150–400)
RBC: 3.3 MIL/uL — AB (ref 3.87–5.11)
RDW: 17.6 % — AB (ref 11.5–15.5)
WBC: 10.1 10*3/uL (ref 4.0–10.5)

## 2016-01-03 MED ORDER — IOPAMIDOL (ISOVUE-370) INJECTION 76%
INTRAVENOUS | Status: AC
  Filled 2016-01-03: qty 100

## 2016-01-03 MED ORDER — IOPAMIDOL (ISOVUE-370) INJECTION 76%
100.0000 mL | Freq: Once | INTRAVENOUS | Status: AC | PRN
  Administered 2016-01-03: 100 mL via INTRAVENOUS

## 2016-01-03 MED ORDER — ALBUTEROL SULFATE HFA 108 (90 BASE) MCG/ACT IN AERS
1.0000 | INHALATION_SPRAY | Freq: Once | RESPIRATORY_TRACT | Status: AC
  Administered 2016-01-03: 2 via RESPIRATORY_TRACT
  Filled 2016-01-03: qty 6.7

## 2016-01-03 MED ORDER — ALBUTEROL SULFATE (2.5 MG/3ML) 0.083% IN NEBU
5.0000 mg | INHALATION_SOLUTION | Freq: Once | RESPIRATORY_TRACT | Status: AC
  Administered 2016-01-03: 5 mg via RESPIRATORY_TRACT
  Filled 2016-01-03: qty 6

## 2016-01-03 NOTE — ED Notes (Signed)
Pt requested this RN attempt at peripheral access rather than access her port at this time.

## 2016-01-03 NOTE — ED Notes (Signed)
Called RT, spoke with Quillian Quince, he will administer breathing tx.

## 2016-01-03 NOTE — ED Provider Notes (Signed)
[facility] DEPT Provider Note   CSN: [id number] Arrival date & time: 01/03/16  1350     History   Chief Complaint Chief Complaint  Patient presents with  . Shortness of Breath    HPI [name] is a 61 y.o. female.  HPI   48-year-old female presents today with complaints of shortness of breath. Patient is accompanied by her daughters who provide part of her history. Her daughter who is a medical doctor reports that proximally 4 days ago she started to have a low-grade fever of 101, cough and minor shortness of breath. She notes that patient was started on azithromycin, and had improvement in symptoms over the weekend. She notes that today she was having shortness of breath with exertion, continued cough, but no fever.patient notes that she is a cancer patient with hormone driven therapy, she has DVT's of her left lower extremity currently on Eliquis. Patient denies any chest pain, pleuritic pain, worsening swelling of her lower extremities, dizziness or lightheadedness.  Past Medical History:  Diagnosis Date  . Chemotherapy-induced neuropathy ([place])   . DVT, lower extremity ([place])    left  02-12-2015  currently on Eliqius  . Endometrial cancer [facility]) oncologist-  dr Marko Plume   02-2015  . 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 Family history of breast cancer   . History of adenomatous polyp of colon    tubulovillious adenoma 09/ 2010  . History of ovarian cyst    complex  . History of radiation therapy 10/21/08-11/23/08 & 12/07/10/13/12/23 2010   ENDOMETRIOID   . History of uterine fibroid   . Hypertension   . Inguinal fluid collection 02/2015   right-drained x2  . Iron deficiency anemia    chronic severe  . Metastasis to lung ([place])   . 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 Port catheter in place 11/01/2015  . Hyponatremia 10/09/2015  . Bradycardia   . Elevated blood pressure reading 10/07/2015  .  Abdominal pain 10/07/2015  . Dehydration 10/07/2015  . Hypertensive urgency 10/07/2015  . Metastasis to brain ([place]) 09/29/2015  . Endometrial cancer, FIGO stage IVB ([place])   . Pericardial effusion 09/23/2015  . Malignant neoplasm metastatic to lung ([place]) 08/20/2015  . 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 Genetic testing 07/07/2015  . Adenomatous colon polyp 05/10/2015  . Mass of right inguinal region 05/10/2015  . Family history of breast cancer   . Inguinal lymphocyst 04/22/2015  . Chemotherapy induced nausea and vomiting 03/05/2015  . Leg DVT (deep venous thromboembolism), chronic, left ([place]) 03/05/2015  . Poor venous access 02/22/2015  . Constipation due to pain medication 02/22/2015  . Noncompliance with medication regimen 02/22/2015  . Chronic anticoagulation 02/22/2015  . Leg DVT (deep venous thromboembolism), acute ([place]) 02/22/2015  . Chemotherapy-induced neuropathy ([place]) 02/22/2015  . Cancer associated pain 02/22/2015  . Iron deficiency anemia due to chronic blood loss 02/22/2015  . International Federation of Gynecology and Obstetrics (FIGO) stage IVB epithelial ovarian cancer ([place]) 02/22/2015  . Uterine cancer ([place]) 02/12/2015  . Endometrial cancer ([place]) 11/20/2010  . Essential hypertension 09/23/2008  . NONSPECIFIC ABN FINDING RAD & OTH EXAM GI TRACT 09/23/2008    Past Surgical History:  Procedure Laterality Date  . ABDOMINAL HYSTERECTOMY  2010  . APPLICATION OF WOUND VAC Right 05/12/2015   Procedure: APPLICATION OF WOUND  VAC;  Surgeon: Hall Busing, MD;  Location: [facility];  Service: General;  Laterality: Right;  . COLONOSCOPY W/ POLYPECTOMY  09-27-2008  . EXPLORATORY LAPAROTOMY /  TOTAL ABDOMINAL HYSTERECTOMY/ BILATERAL SALPINOOPHORECTOMY/  PARTIAL OMENTECTOMY  03-18-2008  . INGUINAL HERNIA REPAIR Right 05/12/2015   Procedure: INCISION AND DRAINGE  RIGHT INGUINAL FLUID COLLECTION ;  Surgeon: Hall Busing, MD;  Location[facility];  Service: General;  Laterality: Right;  . IR GENERIC HISTORICAL  08/10/2015   IR US GUIDE VASC ACCESS RIGHT 08/10/2015 Jacqulynn Cadet, MD [facility]-INTERV RAD  . IR GENERIC HISTORICAL  08/10/2015   IR FLUORO GUIDE CV LINE RIGHT 08/10/2015 Jacqulynn Cadet, MD [facility]-INTERV RAD  . porta cath  06/2010   removal  . TUBAL LIGATION  1990's  . UMBILICAL HERNIA REPAIR     infant    OB History    No data available       Home Medications    Prior to Admission medications   Medication Sig Start Date End Date Taking? Authorizing Provider  amLODipine (NORVASC) 5 MG tablet Take 1 tablet (5 mg total) by mouth 2 (two) times daily. 10/12/15  Yes Brittainy Erie Noe, PA-C  apixaban (ELIQUIS) 5 MG TABS tablet Take 1 tablet (5 mg total) by mouth 2 (two) times daily. 11/14/15  Yes Lennis Marion Downer, MD  dexamethasone (DECADRON) 2 MG tablet Take 0.5 tablets (1 mg total) by mouth 2 (two) times daily. Patient taking differently: Take 1 mg by mouth daily.  11/23/15  Yes Hayden Pedro, PA-C  letrozole [facility]) 2.5 MG tablet Take 1 tablet (2.5 mg total) by mouth daily. 10/21/15  Yes Lennis Marion Downer, MD  levETIRAcetam (KEPPRA) 500 MG tablet Take 1 tablet (500 mg total) by mouth 2 (two) times daily. 11/30/15  Yes Lennis Marion Downer, MD  morphine (MSIR) 15 MG tablet Take 1 tablet (15 mg total) by mouth every 6 (six) hours as needed for moderate pain or severe pain. 10/31/15  Yes Lennis Marion Downer, MD  pantoprazole (PROTONIX) 40 MG tablet Take 1 tablet (40 mg total) by mouth daily. 11/30/15  Yes Lennis Marion Downer, MD  Pediatric Multivit-Minerals-C (FLINTSTONES GUMMIES PO) Take 1 tablet by mouth daily.   Yes Historical Provider, MD  polyethylene glycol (MIRALAX / GLYCOLAX) packet Take 17 g by mouth daily as needed for moderate constipation.   Yes Historical Provider, MD  potassium chloride (K-DUR) 10 MEQ tablet Take 2 tablets  twice a day or as directed. Patient taking differently: Take 20 mEq by mouth 2 (two) times daily.  12/12/15  Yes Lennis Marion Downer, MD  senna (SENOKOT) 8.6 MG tablet Take 1 tablet by mouth daily.   Yes Historical Provider, MD  zolpidem (AMBIEN) 5 MG tablet Take 1 tablet (5 mg total) by mouth at bedtime as needed for sleep. 12/13/15  Yes Lennis Marion Downer, MD  hydrochlorothiazide (HYDRODIURIL) 25 MG tablet Take 0.5 tablets (12.5 mg total) by mouth daily. Patient not taking: Reported on 01/03/2016 11/16/15   Brittainy M Rosita Fire, PA-C  lidocaine-prilocaine (EMLA) cream Apply to Porta-cath 1-2 hrs prior to access as directed. Patient taking differently: Apply 1 application topically as needed (Apply to port-a-cath 1-2 hours prior to access.).  08/04/15   Lennis Marion Downer, MD  LORazepam (ATIVAN) 0.5 MG tablet Take 1 tablet (0.5 mg total) by mouth every 6 (six) hours as needed (nausea). 10/31/15   Lennis Marion Downer, MD  magnesium hydroxide (MILK OF MAGNESIA) 400 MG/5ML suspension Take  5 mLs by mouth daily as needed for mild constipation.    Historical Provider, MD    Family History Family History  Problem Relation Age of Onset  . Bone cancer Paternal Grandmother     OSSEOUS METASTASIS  . Heart disease Father   . Heart disease Sister   . Bone cancer Maternal Grandmother 75  . Leukemia Maternal Grandfather 46  . Liver cancer Maternal Uncle   . Breast cancer Cousin 58    maternal first cousin    Social History Social History  Substance Use Topics  . Smoking status: Never Smoker  . Smokeless tobacco: Never Used  . Alcohol use No     Allergies   Morphine and related and Dilaudid [hydromorphone hcl]   Review of Systems Review of Systems  All other systems reviewed and are negative.   Physical Exam Updated Vital Signs BP 146/81 (BP Location: Left Arm)   Pulse 100   Temp 98.8 F (37.1 C) (Oral)   Resp 18   SpO2 95%   Physical Exam  Constitutional: She is oriented to person, place,  and time. She appears well-developed and well-nourished.  HENT:  Head: Normocephalic and atraumatic.  Eyes: Conjunctivae are normal. Pupils are equal, round, and reactive to light. Right eye exhibits no discharge. Left eye exhibits no discharge. No scleral icterus.  Neck: Normal range of motion. No JVD present. No tracheal deviation present.  Cardiovascular: Regular rhythm and normal heart sounds.   Pulmonary/Chest: Effort normal and breath sounds normal. No stridor. No respiratory distress. She has no wheezes. She has no rales. She exhibits no tenderness.  Musculoskeletal:  Very minor edema to the left lower extremity, patient notes this is her baseline; no swelling or edema to the right lower extremity  Neurological: She is alert and oriented to person, place, and time. Coordination normal.  Psychiatric: She has a normal mood and affect. Her behavior is normal. Judgment and thought content normal.  Nursing note and vitals reviewed.    ED Treatments / Results  Labs (all labs ordered are listed, but only abnormal results are displayed) Labs Reviewed  CBC - Abnormal; Notable for the following:       Result Value   RBC 3.30 (*)    Hemoglobin 9.2 (*)    HCT 28.1 (*)    RDW 17.6 (*)    All other components within normal limits  COMPREHENSIVE METABOLIC PANEL - Abnormal; Notable for the following:    Glucose, Bld 126 (*)    Albumin 3.1 (*)    All other components within normal limits    EKG  EKG Interpretation None       Radiology Dg Chest 2 View  Result Date: 01/03/2016 CLINICAL DATA:  Increasing shortness of breath for 1 day. EXAM: CHEST  2 VIEW COMPARISON:  09/23/2015 FINDINGS: Known left-sided mass/adenopathy. Mild cardiopericardial enlargement that is similar to prior. Trace left effusion and mild left diaphragm elevation. There is increased opacity in the posterior left chest best seen in the lateral projection. Clear right chest. Porta catheter on the right with tip at the  SVC level. IMPRESSION: 1. Increased opacity in the posterior left chest when correlated with 09/23/2015 exam. This could reflect evolution of pneumonia or evolution of patient's thoracic metastatic disease. 2. Cardiopericardial enlargement. Pericardial effusion present on 09/23/2015 CT. 3. Small left pleural effusion. Electronically Signed   By: Monte Fantasia M.D.   On: 01/03/2016 14:47   Ct Angio Chest Pe W And/or Wo Contrast  Result  Date: 01/03/2016 CLINICAL DATA:  Shortness of breath. EXAM: CT ANGIOGRAPHY CHEST WITH CONTRAST TECHNIQUE: Multidetector CT imaging of the chest was performed using the standard protocol during bolus administration of intravenous contrast. Multiplanar CT image reconstructions and MIPs were obtained to evaluate the vascular anatomy. CONTRAST:  100 cc of Isovue 3 7 COMPARISON:  09/23/2015 FINDINGS: Cardiovascular: The heart size is moderately enlarged. There is a pericardial effusion which appears increased in volume from previous exam. The main pulmonary artery is patent. No saddle embolus. The segmental and subsegmental pulmonary arteries within the left lung are obscured by motion artifact. The proximal portions of the left upper lobe and left lower lobe lobar pulmonary arteries appear patent without embolus. No evidence for pulmonary emboli to the right lung. Mediastinum/Nodes: The trachea appears patent and is midline. Normal appearance of the esophagus. Index left-sided pre-vascular lymph node measures 2.6 cm, image 103 of series 10. Previously 3.0 cm. Large cystic mass within the AP window region on the left is again noted. This measures 7.5 x 6.4 cm, image 128 of series 10. Previously 7.2 x 6.6 cm. Lungs/Pleura: There is a small left pleural effusion which is similar in volume to the previous exam. Small right effusion is new from prior study. Left upper lobe lung mass measures 2.2 x 3.4 cm, image 34 of series 11. On the previous exam this measured 4.4 x 4.3 cm. Index nodule  within the superior segment of right lower lobe measures 1.5 cm, image 49 of series 11. Previously 0.9 cm. Upper Abdomen: No acute abnormality noted. Musculoskeletal: No aggressive bone lesions. Review of the MIP images confirms the above findings. IMPRESSION: 1. Exam detail is diminished secondary to motion artifact. This predominantly affects the segmental pulmonary arteries of the left lung. Within this limitation no evidence for acute pulmonary embolus noted. 2. Increase in volume of left pericardial. 3. Mixed response to therapy with respect to mediastinal adenopathy and pulmonary lesions. The dominant left upper lobe lung mass is decreased in size in the interval. There is a nodule within the right lower lobe which is increased in size in the interval. 4. Pre-vascular lymph node is mildly decreased in size in the interval. Large necrotic cystic nodal mass within the AP window region is stable in the interval. Electronically Signed   By: Kerby Moors M.D.   On: 01/03/2016 20:21    Procedures Procedures (including critical care time)  Medications Ordered in ED Medications  iopamidol (ISOVUE-370) 76 % injection (not administered)  albuterol (PROVENTIL) (2.5 MG/3ML) 0.083% nebulizer solution 5 mg (5 mg Nebulization Given 01/03/16 1730)  iopamidol (ISOVUE-370) 76 % injection 100 mL (100 mLs Intravenous Contrast Given 01/03/16 1908)  albuterol (PROVENTIL HFA;VENTOLIN HFA) 108 (90 Base) MCG/ACT inhaler 1-2 puff (2 puffs Inhalation Given 01/03/16 2119)     Initial Impression / Assessment and Plan / ED Course  I have reviewed the triage vital signs and the nursing notes.  Pertinent labs & imaging results that were available during my care of the patient were reviewed by me and considered in my medical decision making (see chart for details).  Clinical Course    61 year old female presents today with complaints of shortness of breath. Prior to my evaluation patient received albuterol which she notes  completely resolved her symptoms. Patient had a chest x-ray that showed questionable pneumonia versus metastatic disease there was worsening. She had clear lung sounds was afebrile and nontoxic. Her heart rate was still elevated 100. Due to previous history DVT, active cancer  had concern for pulmonary embolism. I discussed with patient and her daughters the risks of PE, although she is currently on L quests this is no guarantee that she could not develop or have a significant pulmonary embolus him. They agreed that proceeding with CT scan would be appropriate at this time. Patient's CT scan returned showing no signs of pulmonary embolus, no signs of infectious etiology. Patient's scan results were read to both patient and her daughters in the room. I do not feel it appropriate to initiate another antibiotic, patient symptomatically improved, she will be given a albuterol inhaler and encouraged follow-up with her primary care provider for reevaluation. She is instructed to return immediately to the emergency room if any new or worsening signs or symptoms present. Patient did haveslight pericardial effusion, this was noted on her x-ray in September, she has no signs of heart failure or concerning cardiac etiology today. I ambulated patient while on O2 monitoring with no shortness of breath, hest pain, or decreased oxygen saturation. Everyone present verbalized understanding and agreement to today's plan had no further questions at time of discharge  Final Clinical Impressions(s) / ED Diagnoses   Final diagnoses:  Upper respiratory tract infection, unspecified type    New Prescriptions Discharge Medication List as of 01/03/2016  8:43 PM       Okey Regal, PA-C 01/03/16 [place], MD 01/04/16 [phone]

## 2016-01-03 NOTE — Discharge Instructions (Signed)
Please read attached information. If you experience any new or worsening signs or symptoms please return to the emergency room for evaluation. Please follow-up with your primary care provider or specialist as discussed. Please use medication prescribed only as directed and discontinue taking if you have any concerning signs or symptoms.   °

## 2016-01-03 NOTE — ED Triage Notes (Signed)
Per pt/family-states became SOB last night, especially with exertion-states she has had cough and has been on a Z-Pack-states not  Getting better

## 2016-01-04 ENCOUNTER — Telehealth: Payer: Self-pay

## 2016-01-04 ENCOUNTER — Other Ambulatory Visit: Payer: Self-pay

## 2016-01-04 ENCOUNTER — Other Ambulatory Visit: Payer: Self-pay | Admitting: Oncology

## 2016-01-04 DIAGNOSIS — I3139 Other pericardial effusion (noninflammatory): Secondary | ICD-10-CM

## 2016-01-04 DIAGNOSIS — C541 Malignant neoplasm of endometrium: Secondary | ICD-10-CM

## 2016-01-04 DIAGNOSIS — I313 Pericardial effusion (noninflammatory): Secondary | ICD-10-CM

## 2016-01-04 NOTE — Telephone Encounter (Signed)
Pt called stating she cannot make 0900 echo d/t transportation issues. She also cannot make 1/5 appt with Dr Marko Plume. Gave pt phone number to scheduler for echo so she can coordinate her calendar. This message to Dr Marko Plume so she can retract her inbasket to scheduler.

## 2016-01-04 NOTE — Telephone Encounter (Signed)
-----   Message from Gordy Levan, MD sent at 01/04/2016  8:26 AM EST ----- Regarding: echo for pericardial effusion, apt Seen in ED yesterday for SOB,  pericardial effusion on CT Please let patient know that I would like to get echocardiogram, hopefully today or tomorrow, and I can see her 1-5  I have put in order for stat echocardiogram today or 1-4, and can see her 1-5 using [facility], as that patient still in hospital. I have sent high priority scheduling message  thanks

## 2016-01-04 NOTE — Telephone Encounter (Signed)
lvm for pt to expect calls to get echocardiogram today or tomorrow and to see Dr Marko Plume on Friday.  LVM for echo 01-05-16 at [facility] at 0900, she does have MRI at [facility] at 2pm, we were unable to get appts together to save a trip. Also let her know 1130 appt time with Dr Marko Plume ( or 1100 if she requires labs) on 1/5.

## 2016-01-05 ENCOUNTER — Ambulatory Visit (HOSPITAL_COMMUNITY)
Admission: RE | Admit: 2016-01-05 | Discharge: 2016-01-05 | Disposition: A | Discharge status: Home / Self Care | Payer: Medicaid Other | Source: Ambulatory Visit | Attending: Radiation Oncology | Admitting: Radiation Oncology

## 2016-01-05 ENCOUNTER — Ambulatory Visit (HOSPITAL_COMMUNITY): Payer: Medicaid Other

## 2016-01-05 DIAGNOSIS — R0600 Dyspnea, unspecified: Secondary | ICD-10-CM | POA: Diagnosis not present

## 2016-01-05 DIAGNOSIS — C7931 Secondary malignant neoplasm of brain: Secondary | ICD-10-CM | POA: Insufficient documentation

## 2016-01-05 DIAGNOSIS — C7949 Secondary malignant neoplasm of other parts of nervous system: Secondary | ICD-10-CM | POA: Diagnosis present

## 2016-01-05 MED ORDER — GADOBENATE DIMEGLUMINE 529 MG/ML IV SOLN
15.0000 mL | Freq: Once | INTRAVENOUS | Status: AC
  Administered 2016-01-05: 15 mL via INTRAVENOUS

## 2016-01-09 ENCOUNTER — Ambulatory Visit: Payer: Self-pay | Admitting: Radiation Oncology

## 2016-01-09 ENCOUNTER — Ambulatory Visit (HOSPITAL_COMMUNITY)
Admission: RE | Admit: 2016-01-09 | Discharge: 2016-01-09 | Disposition: A | Discharge status: Home / Self Care | Payer: Medicaid Other | Source: Ambulatory Visit | Attending: Oncology | Admitting: Oncology

## 2016-01-09 ENCOUNTER — Ambulatory Visit: Admission: RE | Admit: 2016-01-09 | Payer: Medicaid Other | Source: Ambulatory Visit | Admitting: Radiation Oncology

## 2016-01-09 DIAGNOSIS — C541 Malignant neoplasm of endometrium: Secondary | ICD-10-CM

## 2016-01-09 DIAGNOSIS — I10 Essential (primary) hypertension: Secondary | ICD-10-CM | POA: Diagnosis not present

## 2016-01-09 DIAGNOSIS — I509 Heart failure, unspecified: Secondary | ICD-10-CM | POA: Diagnosis not present

## 2016-01-09 DIAGNOSIS — I3139 Other pericardial effusion (noninflammatory): Secondary | ICD-10-CM

## 2016-01-09 DIAGNOSIS — I313 Pericardial effusion (noninflammatory): Secondary | ICD-10-CM | POA: Diagnosis not present

## 2016-01-09 NOTE — Progress Notes (Signed)
  Echocardiogram 2D Echocardiogram has been performed.  Tresa Res 01/09/2016, 10:51 AM

## 2016-01-10 ENCOUNTER — Telehealth: Payer: Self-pay

## 2016-01-10 NOTE — Telephone Encounter (Signed)
Told Ms Steagall the results of the Echocardiogram as noted below by Dr. Marko Plume.

## 2016-01-10 NOTE — Telephone Encounter (Signed)
I spoke with the patient to let her know we will review her imaging on Thursday, but we will likely need to consider treatment to a new lesion. She is interested in proceeding, and we will formally review her scans with Dr. Tammi Klippel when she comes for her next visit.

## 2016-01-10 NOTE — Telephone Encounter (Signed)
-----   Message from Gordy Levan, MD sent at 01/10/2016  8:43 AM EST ----- Labs seen and need follow up: please let family know no significant pericardial effusion

## 2016-01-11 NOTE — Progress Notes (Signed)
Mrs. [name] 61 y.o. Woman with metastatic endometrial carcinoma involving brain, lungs, chest and other areas. 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 .    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 Interval increase in size of left cerebellar metastasis, now measuring 2.4 x 2.6 x 2.0 cm. Similar localized edema and mass effect on the adjacent fourth ventricle. Fourth ventricle remains patent without obstructive hydrocephalus. 2. Interval development of a second 1.3 x 1.3 x 1.1 cm metastatic lesion within the left periventricular white matter of the left frontal lobe. Mild localized edema without significant mass effect   Dose of Decadron, if applicable: 99991111 1 mg  Recent neurologic symptoms, if any:   Seizures: None Keppa 500 mg bid  Headaches: None  Nausea: No  Dizziness/ataxia: Has to stand for a second to get her balance  Difficulty with hand coordination: None  Focal numbness/weakness extremities:  Visual deficits/changes: None  Confusion/Memory deficits: Has problems with memory at times short term and long term  Painful bone metastases at present, if any: None  SAFETY ISSUES: Prior radiation? 10-07-15 Left cerebellar, 08/24/2015-09/15/2015 Left lung-chest   Pacemaker/ICD? :No  Possible current pregnancy? :No  Is the patient on methotrexate? :No Has a port-a-cath right chest Additional Complaints / other details:        FOLLOW UP RECONSULT on 01/12/2016

## 2016-01-12 ENCOUNTER — Ambulatory Visit
Admission: RE | Admit: 2016-01-12 | Discharge: 2016-01-12 | Disposition: A | Discharge status: Home / Self Care | Payer: Medicaid Other | Source: Ambulatory Visit | Attending: Radiation Oncology | Admitting: Radiation Oncology

## 2016-01-12 ENCOUNTER — Ambulatory Visit: Payer: Self-pay | Admitting: Radiation Oncology

## 2016-01-12 ENCOUNTER — Ambulatory Visit: Payer: Medicaid Other

## 2016-01-12 ENCOUNTER — Encounter: Payer: Self-pay | Admitting: Radiation Oncology

## 2016-01-12 ENCOUNTER — Ambulatory Visit: Payer: Medicaid Other | Admitting: Radiation Oncology

## 2016-01-12 VITALS — BP 132/74 | HR 107 | Temp 98.9°F | Resp 18 | Ht 62.0 in | Wt 162.2 lb

## 2016-01-12 DIAGNOSIS — Z510 Encounter for antineoplastic radiation therapy: Secondary | ICD-10-CM | POA: Insufficient documentation

## 2016-01-12 DIAGNOSIS — C7931 Secondary malignant neoplasm of brain: Secondary | ICD-10-CM | POA: Diagnosis present

## 2016-01-12 DIAGNOSIS — C541 Malignant neoplasm of endometrium: Secondary | ICD-10-CM | POA: Diagnosis present

## 2016-01-12 NOTE — Progress Notes (Signed)
Does patient have an allergy to IV contrast dye?: No   Has patient ever received premedication for IV contrast dye?: No   Does patient take metformin?: No  If patient does take metformin when was the last dose: No  Date of lab work: {Time; dates 01-03-16 BUN: 14 CR: 0.65  IV site: {iv locations:Right antecubital   BP 132/74   Pulse (!) 107   Temp 98.9 F (37.2 C) (Oral)   Resp 18   Ht 5\' 2"  (1.575 m)   Wt 162 lb 3.2 oz (73.6 kg)   SpO2 100%   BMI 29.67 kg/m

## 2016-01-12 NOTE — Progress Notes (Signed)
Does patient have an allergy to IV contrast dye?: No   Has patient ever received premedication for IV contrast dye?: No   Does patient take metformin?: No  If patient does take metformin when was the last dose: No  Date of lab work: {Time; dates 01-03-16) BUN: 14 CR: 0.65  IV site: iv locations:Right antecubital    Per Shona Simpson, PA, D/C patient's  IV RAC catheter tip intact, applied 2  2x2 gause over site and taped,secured and held pressure 1 minute, instructions to leave on 2-3 hours and band a id given to apply over site after Hooper removed, patient gave verbal understanding, cup of water offered 11:13 AM   There were no vitals taken for this visit.

## 2016-01-13 ENCOUNTER — Ambulatory Visit: Payer: Medicaid Other

## 2016-01-13 ENCOUNTER — Ambulatory Visit: Payer: Medicaid Other | Admitting: Radiation Oncology

## 2016-01-15 NOTE — Progress Notes (Signed)
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    ICD-9-CM ICD-10-CM   1. Metastatic cancer to brain ([place]) 198.3 C79.31     Interval Since Last Radiation:  3 months  10/07/15: PTV1 Lt Cerebellar 23 mm was treated to 18 Gy in one fraction by Dr. Tammi Klippel.  08/24/2015-09/15/2015: Left Lung, 37.5 Gy in 15 fractions by Dr. Sondra Come.  2010: The patient completed external beam radiation therapy and high-dose rate brachytherapy as part of management of her stage IC endometrioid adenocarcinoma.  The patient completed her radiation treatments on 12/23/2008 by Dr. Sondra Come.  Narrative:  The patient returns today for routine follow-up after undergoing radiotherapy to the brain. She has a history of synchronous endometrial cancer and ovarian cancer and has been previously treated with radiotherapy for her endometrial cancer initially, followed by treatment for metastatic disease in 2017 to the lung and brain. She continues under the care of Dr. Marko Plume.  Her most recent MRI on 01/05/16 which was her first post radiotherapy to the brain. She had a new lesion measuring 1.3 x 1.3 x 1.1 cm in the left periventricular white matter of the left frontal lobe. Her previously treated left cerebellar metastasis measured 2.4 x 2.6 x 2 cm with localized edema and similar mass effect on the adjacent fourth ventricle without hydrocephalus.        On review of systems, the patient reports that she feels great and reports she is doing well overall. She denies any weakness, dizziness, changes in vision or hearing. She denies any chest pain, shortness of breath, cough, fevers, chills, night sweats, unintended weight changes. She denies any bowel or bladder disturbances,  and denies abdominal pain, nausea or vomiting. She denies any new musculoskeletal or joint aches or pains. A complete review of systems is obtained and is otherwise negative.    Past Medical History:  Past Medical History:  Diagnosis Date  . Chemotherapy-induced neuropathy ([place])   . DVT, lower extremity ([place])    left  02-12-2015  currently on Eliqius  . Endometrial cancer [facility]) oncologist-  dr Marko Plume   02-2015  . 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 Family history of breast cancer   . History of adenomatous polyp of colon    tubulovillious adenoma 09/ 2010  . History of ovarian cyst    complex  . History of radiation therapy 10/21/08-11/23/08 & 12/07/10/13/12/23 2010   ENDOMETRIOID   . History of uterine fibroid   . Hypertension   . Inguinal fluid collection 02/2015   right-drained x2  . Iron deficiency anemia    chronic severe  . Metastasis to lung ([place])   . 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 ABDOMINAL HYSTERECTOMY  2010  . APPLICATION OF WOUND VAC Right 05/12/2015   Procedure: APPLICATION OF WOUND VAC;  Surgeon: Hall Busing, MD;  Location: [facility];  Service: General;  Laterality: Right;  . COLONOSCOPY W/ POLYPECTOMY  09-27-2008  . EXPLORATORY LAPAROTOMY /  TOTAL ABDOMINAL HYSTERECTOMY/ BILATERAL SALPINOOPHORECTOMY/  PARTIAL OMENTECTOMY  03-18-2008  . INGUINAL HERNIA REPAIR Right 05/12/2015   Procedure: INCISION AND DRAINGE RIGHT INGUINAL FLUID COLLECTION ;  Surgeon: Hall Busing, MD;  Location: [facility];  Service: General;  Laterality: Right;  . IR GENERIC HISTORICAL  08/10/2015   IR US GUIDE VASC ACCESS RIGHT 08/10/2015 Jacqulynn Cadet, MD [facility]-INTERV RAD  . IR GENERIC HISTORICAL  08/10/2015   IR FLUORO GUIDE CV LINE RIGHT 08/10/2015 Jacqulynn Cadet, MD [facility]INTER[facility]  . porta cath  06/2010   removal  . TUBAL LIGATION  1990's  . UMBILICAL HERNIA  REPAIR     infant    Social History:  Social History   Social History  . Marital status: Married    Spouse name: N/A  . Number of children: 3  . Years of education: N/A   Occupational History  . Not on file.   Social History Main Topics  . Smoking status: Never Smoker  . Smokeless tobacco: Never Used  . Alcohol use No  . Drug use: No  . Sexual activity: Not on file   Other Topics Concern  . Not on file   Social History Narrative  . No narrative on file    Family History: Family History  Problem Relation Age of Onset  . Bone cancer Paternal Grandmother     OSSEOUS METASTASIS  . Heart disease Father   . Heart disease Sister   . Bone cancer Maternal Grandmother 75  . Leukemia Maternal Grandfather 58  . Liver cancer Maternal Uncle   . Breast cancer Cousin 58    maternal first cousin                      ALLERGIES:  is allergic to morphine and related and dilaudid [hydromorphone hcl].  Meds: Current Outpatient Prescriptions  Medication Sig Dispense Refill  . amLODipine (NORVASC) 5 MG tablet Take 1 tablet (5 mg total) by mouth 2 (two) times daily. 60 tablet 3  . apixaban (ELIQUIS) 5 MG TABS tablet Take 1 tablet (5 mg total) by mouth 2 (two) times daily. 180 tablet 1  . letrozole (FEMARA) 2.5 MG tablet Take 1 tablet (2.5 mg total) by mouth daily. 30 tablet 2  . levETIRAcetam (KEPPRA) 500 MG tablet Take 1 tablet (500 mg total) by mouth 2 (two) times daily. 60 tablet 1  . lidocaine-prilocaine (EMLA) cream Apply to Porta-cath 1-2 hrs prior to access as directed. (Patient taking differently: Apply 1 application topically as needed (Apply to port-a-cath 1-2 hours prior to access.). ) 30 g 1  . LORazepam (ATIVAN) 0.5 MG tablet Take 1 tablet (0.5 mg total) by mouth every 6 (six) hours as needed (nausea). 20 tablet 0  . morphine (MSIR) 15 MG tablet Take 1 tablet (15 mg total) by mouth every 6 (six) hours as needed for moderate pain or severe pain. 20 tablet 0  . pantoprazole  (PROTONIX) 40 MG tablet Take 1 tablet (40 mg total) by mouth daily. 30 tablet 2  . Pediatric Multivit-Minerals-C (FLINTSTONES GUMMIES PO) Take 1 tablet by mouth daily.    . polyethylene glycol (MIRALAX / GLYCOLAX) packet Take 17 g by mouth daily as needed for moderate constipation.    . potassium chloride (K-DUR) 10 MEQ tablet Take 2 tablets twice a day or as directed. (Patient taking differently: Take 20 mEq by mouth 2 (two) times daily. ) 120 tablet 0  . senna (SENOKOT) 8.6 MG tablet Take 1 tablet by mouth daily.    Marland Kitchen zolpidem (AMBIEN) 5 MG tablet Take 1 tablet (5 mg total) by mouth at bedtime as needed for sleep. [place]  tablet 1  . magnesium hydroxide (MILK OF MAGNESIA) 400 MG/5ML suspension Take 5 mLs by mouth daily as needed for mild constipation.     No current facility-administered medications for this encounter.     Physical Findings:  height is 5\' 2"  (1.575 m) and weight is 162 lb 3.2 oz (73.6 kg). Her oral temperature is 98.9 F (37.2 C). Her blood pressure is 132/74 and her pulse is 107 (abnormal). Her respiration is 18 and oxygen saturation is 100%.   In general this is a well appearing African American female in no acute distress. She's alert and oriented x4 and appropriate throughout the examination. Cardiopulmonary assessment is negative for acute distress and she exhibits normal effort.   Lab Findings: Lab Results  Component Value Date   WBC 10.1 01/03/2016   HGB 9.2 (L) 01/03/2016   HGB 9.6 (L) 12/12/2015   HCT 28.1 (L) 01/03/2016   HCT 29.0 (L) 12/12/2015   PLT 363 01/03/2016   PLT 260 12/12/2015    Lab Results  Component Value Date   NA 140 01/03/2016   NA 142 12/19/2015   K 3.5 01/03/2016   K 3.9 12/19/2015   CHLORIDE 109 12/19/2015   CO2 26 01/03/2016   CO2 24 12/19/2015   GLUCOSE 126 (H) 01/03/2016   GLUCOSE 101 12/19/2015   BUN 14 01/03/2016   BUN 20.6 12/19/2015   CREATININE 0.65 01/03/2016   CREATININE 0.7 12/19/2015   BILITOT 0.4 01/03/2016   BILITOT  0.25 12/12/2015   ALKPHOS 100 01/03/2016   ALKPHOS 101 12/12/2015   AST 31 01/03/2016   AST 10 12/12/2015   ALT 37 01/03/2016   ALT 11 12/12/2015   PROT 6.8 01/03/2016   PROT 6.2 (L) 12/12/2015   ALBUMIN 3.1 (L) 01/03/2016   ALBUMIN 2.7 (L) 12/12/2015   CALCIUM 9.1 01/03/2016   CALCIUM 9.5 12/19/2015   ANIONGAP 9 01/03/2016    Radiographic Findings: Dg Chest 2 View  Result Date: 01/03/2016 CLINICAL DATA:  Increasing shortness of breath for 1 day. EXAM: CHEST  2 VIEW COMPARISON:  09/23/2015 FINDINGS: Known left-sided mass/adenopathy. Mild cardiopericardial enlargement that is similar to prior. Trace left effusion and mild left diaphragm elevation. There is increased opacity in the posterior left chest best seen in the lateral projection. Clear right chest. Porta catheter on the right with tip at the SVC level. IMPRESSION: 1. Increased opacity in the posterior left chest when correlated with 09/23/2015 exam. This could reflect evolution of pneumonia or evolution of patient's thoracic metastatic disease. 2. Cardiopericardial enlargement. Pericardial effusion present on 09/23/2015 CT. 3. Small left pleural effusion. Electronically Signed   By: Monte Fantasia M.D.   On: 01/03/2016 14:47   Ct Angio Chest Pe W And/or Wo Contrast  Result Date: 01/03/2016 CLINICAL DATA:  Shortness of breath. EXAM: CT ANGIOGRAPHY CHEST WITH CONTRAST TECHNIQUE: Multidetector CT imaging of the chest was performed using the standard protocol during bolus administration of intravenous contrast. Multiplanar CT image reconstructions and MIPs were obtained to evaluate the vascular anatomy. CONTRAST:  100 cc of Isovue 3 7 COMPARISON:  09/23/2015 FINDINGS: Cardiovascular: The heart size is moderately enlarged. There is a pericardial effusion which appears increased in volume from previous exam. The main pulmonary artery is patent. No saddle embolus. The segmental and subsegmental pulmonary arteries within the left lung are  obscured by motion artifact. The proximal portions of the left upper lobe and left lower lobe lobar pulmonary arteries appear patent without embolus. No evidence for pulmonary emboli to  the right lung. Mediastinum/Nodes: The trachea appears patent and is midline. Normal appearance of the esophagus. Index left-sided pre-vascular lymph node measures 2.6 cm, image 103 of series 10. Previously 3.0 cm. Large cystic mass within the AP window region on the left is again noted. This measures 7.5 x 6.4 cm, image 128 of series 10. Previously 7.2 x 6.6 cm. Lungs/Pleura: There is a small left pleural effusion which is similar in volume to the previous exam. Small right effusion is new from prior study. Left upper lobe lung mass measures 2.2 x 3.4 cm, image 34 of series 11. On the previous exam this measured 4.4 x 4.3 cm. Index nodule within the superior segment of right lower lobe measures 1.5 cm, image 49 of series 11. Previously 0.9 cm. Upper Abdomen: No acute abnormality noted. Musculoskeletal: No aggressive bone lesions. Review of the MIP images confirms the above findings. IMPRESSION: 1. Exam detail is diminished secondary to motion artifact. This predominantly affects the segmental pulmonary arteries of the left lung. Within this limitation no evidence for acute pulmonary embolus noted. 2. Increase in volume of left pericardial. 3. Mixed response to therapy with respect to mediastinal adenopathy and pulmonary lesions. The dominant left upper lobe lung mass is decreased in size in the interval. There is a nodule within the right lower lobe which is increased in size in the interval. 4. Pre-vascular lymph node is mildly decreased in size in the interval. Large necrotic cystic nodal mass within the AP window region is stable in the interval. Electronically Signed   By: Kerby Moors M.D.   On: 01/03/2016 20:21   Mr Jeri Cos [id number] Contrast  Result Date: 01/05/2016 CLINICAL DATA:  Follow-up exam for metastatic endometrial  cancer. SRS protocol. EXAM: MRI HEAD WITHOUT AND WITH CONTRAST TECHNIQUE: Multiplanar, multiecho pulse sequences of the brain and surrounding structures were obtained without and with intravenous contrast. CONTRAST:  64mL MULTIHANCE GADOBENATE DIMEGLUMINE 529 MG/ML IV SOLN COMPARISON:  Prior MRI from 09/28/2015. FINDINGS: Brain: Known left cerebellar metastatic lesion again seen, increased in size measuring 2.4 x 2.6 x 2.0 cm on today's study (series 10, image 33, previously 2.0 x 2.3 x 2.0 cm). Associated vasogenic edema within the adjacent left cerebellar hemisphere is not significantly changed. Similar mass effect on the adjacent fourth ventricle which remains patent. No evidence for obstructive hydrocephalus. There has been interval development of a new second metastatic lesion, position within the left periventricular white matter just above the left lateral ventricle in the left frontal lobe. Lesion measures 1.3 x 1.3 x 1.1 cm (series 10, image 103). Localized vasogenic edema without significant mass effect. Associated susceptibility artifact suggestive of chronic blood products, similar to previous. No other intracranial metastasis or other mass lesion identified. No midline shift. No hydrocephalus. No extra-axial fluid collection. No abnormal foci of restricted diffusion to suggest acute or subacute ischemia. Underlying atrophy with cerebral white matter disease noted. Incidental note made of an empty sella. Vascular: Left vertebral artery not visualize, likely hypoplastic and possibly occluded. Major intracranial vascular flow voids otherwise maintained. Skull and upper cervical spine: Craniocervical junction within normal limits. Visualized upper cervical spine unremarkable. Bone marrow signal intensity within normal limits. No scalp soft tissue abnormality. Sinuses/Orbits: Globes and orbital soft tissues within normal limits. Paranasal sinuses are clear. Small right mastoid effusion. Inner ear structures  normal. IMPRESSION: 1. Interval increase in size of left cerebellar metastasis, now measuring 2.4 x 2.6 x 2.0 cm. Similar localized edema and mass effect on the adjacent  fourth ventricle. Fourth ventricle remains patent without obstructive hydrocephalus. 2. Interval development of a second 1.3 x 1.3 x 1.1 cm metastatic lesion within the left periventricular white matter of the left frontal lobe. Mild localized edema without significant mass effect. Electronically Signed   By: Jeannine Boga M.D.   On: 01/05/2016 16:31    Impression/Plan: 1. 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 The patient's MRI scan has been personally reviewed by Dr. Tammi Klippel, and he reviews the findings indicating a role for SRS to the newly noted metastasis. We discussed the risks, benefits, short, and long term effects of treatment. The patient understands the delivery and logistics. She will reschedule her appointment for simulation to another day. We also discussed that since she's only taking 1 mg of dexamethasone per day, she can discontinue this, but she will keep [place] informed if she becomes symptomatic with such a change.   The above documentation reflects my direct findings during this shared patient visit. Please see the separate note by Dr. Tammi Klippel on this date for the remainder of the patient's plan of care.     Carola Rhine, PAC

## 2016-01-16 ENCOUNTER — Ambulatory Visit
Admission: RE | Admit: 2016-01-16 | Discharge: 2016-01-16 | Disposition: A | Discharge status: Home / Self Care | Payer: Medicaid Other | Source: Ambulatory Visit | Attending: Radiation Oncology | Admitting: Radiation Oncology

## 2016-01-16 ENCOUNTER — Telehealth: Payer: Self-pay | Admitting: *Deleted

## 2016-01-16 VITALS — BP 146/76 | HR 116 | Temp 99.8°F | Resp 12 | Wt 162.0 lb

## 2016-01-16 DIAGNOSIS — C541 Malignant neoplasm of endometrium: Secondary | ICD-10-CM

## 2016-01-16 DIAGNOSIS — C7931 Secondary malignant neoplasm of brain: Secondary | ICD-10-CM

## 2016-01-16 DIAGNOSIS — Z510 Encounter for antineoplastic radiation therapy: Secondary | ICD-10-CM | POA: Diagnosis not present

## 2016-01-16 MED ORDER — LORAZEPAM 0.5 MG PO TABS: 0.5000 mg | ORAL_TABLET | Freq: Four times a day (QID) | ORAL | 0 refills | Status: AC | PRN

## 2016-01-16 MED ORDER — SODIUM CHLORIDE 0.9% FLUSH
10.0000 mL | INTRAVENOUS | Status: DC | PRN
  Administered 2016-01-16: 10 mL via INTRAVENOUS

## 2016-01-16 NOTE — Progress Notes (Signed)
  Radiation Oncology         [phone]) [phone] ________________________________  Name: [name] MRN: [id number]  Date: 01/16/2016  DOB: 1955-02-04  SIMULATION AND TREATMENT PLANNING NOTE    ICD-9-CM ICD-10-CM   1. Metastasis to brain ([facility]) 198.3 C79.31   2. Endometrial cancer ([facility]) 182.0 C54.1 LORazepam (ATIVAN) 0.5 MG tablet    DIAGNOSIS:  61 yo woman with a new left frontal 13 mm brain metastasis from uterine adenocarcinoma  NARRATIVE:  The patient was brought to the [facility].  Identity was confirmed.  All relevant records and images related to the planned course of therapy were reviewed.  The patient freely provided informed written consent to proceed with treatment after reviewing the details related to the planned course of therapy. The consent form was witnessed and verified by the simulation staff. Intravenous access was established for contrast administration. Then, the patient was set-up in a stable reproducible supine position for radiation therapy.  A relocatable thermoplastic stereotactic head frame was fabricated for precise immobilization.  CT images were obtained.  Surface markings were placed.  The CT images were loaded into the planning software and fused with the patient's targeting MRI scan.  Then the target and avoidance structures were contoured.  Treatment planning then occurred.  The radiation prescription was entered and confirmed.  I have requested 3D planning  I have requested a DVH of the following structures: Brain stem, brain, left eye, right eye, lenses, optic chiasm, target volumes, uninvolved brain, and normal tissue.    SPECIAL TREATMENT PROCEDURE:  The planned course of therapy using radiation constitutes a special treatment procedure. Special care is required in the management of this patient for the following reasons. This treatment constitutes a Special Treatment Procedure for the following reason: High dose per fraction requiring special  monitoring for increased toxicities of treatment including daily imaging.  The special nature of the planned course of radiotherapy will require increased physician supervision and oversight to ensure patient's safety with optimal treatment outcomes.  PLAN:  The patient will receive 20 Gy in 1 fraction.  ________________________________  Sheral Apley Tammi Klippel, M.D.  This document serves as a record of services personally performed by Tyler Pita, MD. It was created on his behalf by Bethann Humble, a trained medical scribe. The creation of this record is based on the scribe's personal observations and the provider's statements to them. This document has been checked and approved by the attending provider.

## 2016-01-16 NOTE — Progress Notes (Signed)
Does patient have an allergy to IV contrast dye?: No   Has patient ever received premedication for IV contrast dye?: No   Does patient take metformin?: No  If patient does take metformin when was the last dose: No  Date of lab work: Time; dates 01-03-16 BUN: 14 CR: 0.65  IV site: {iv locations:Right antecubital

## 2016-01-17 ENCOUNTER — Ambulatory Visit: Payer: Medicaid Other | Admitting: Radiation Oncology

## 2016-01-18 ENCOUNTER — Other Ambulatory Visit: Payer: Self-pay

## 2016-01-18 ENCOUNTER — Encounter: Payer: Self-pay | Admitting: Oncology

## 2016-01-18 DIAGNOSIS — C541 Malignant neoplasm of endometrium: Secondary | ICD-10-CM

## 2016-01-18 MED ORDER — LETROZOLE 2.5 MG PO TABS: 2.5000 mg | ORAL_TABLET | Freq: Every day | ORAL | 1 refills | Status: DC

## 2016-01-19 DIAGNOSIS — Z510 Encounter for antineoplastic radiation therapy: Secondary | ICD-10-CM | POA: Diagnosis not present

## 2016-01-20 ENCOUNTER — Telehealth: Payer: Self-pay

## 2016-01-20 ENCOUNTER — Other Ambulatory Visit: Payer: Self-pay | Admitting: Oncology

## 2016-01-20 ENCOUNTER — Other Ambulatory Visit (HOSPITAL_BASED_OUTPATIENT_CLINIC_OR_DEPARTMENT_OTHER): Payer: Medicaid Other

## 2016-01-20 ENCOUNTER — Ambulatory Visit (HOSPITAL_BASED_OUTPATIENT_CLINIC_OR_DEPARTMENT_OTHER): Payer: Medicaid Other | Admitting: Nurse Practitioner

## 2016-01-20 ENCOUNTER — Other Ambulatory Visit (HOSPITAL_COMMUNITY)
Admission: RE | Admit: 2016-01-20 | Discharge: 2016-01-20 | Disposition: A | Discharge status: Home / Self Care | Payer: Medicaid Other | Source: Ambulatory Visit | Attending: Nurse Practitioner | Admitting: Nurse Practitioner

## 2016-01-20 ENCOUNTER — Ambulatory Visit: Payer: Medicaid Other | Admitting: Radiation Oncology

## 2016-01-20 VITALS — BP 143/70 | HR 114 | Temp 99.8°F | Resp 18 | Ht 62.0 in | Wt 167.5 lb

## 2016-01-20 DIAGNOSIS — R059 Cough, unspecified: Secondary | ICD-10-CM

## 2016-01-20 DIAGNOSIS — C541 Malignant neoplasm of endometrium: Secondary | ICD-10-CM

## 2016-01-20 DIAGNOSIS — R0602 Shortness of breath: Secondary | ICD-10-CM | POA: Diagnosis not present

## 2016-01-20 DIAGNOSIS — C7800 Secondary malignant neoplasm of unspecified lung: Secondary | ICD-10-CM

## 2016-01-20 DIAGNOSIS — C7931 Secondary malignant neoplasm of brain: Secondary | ICD-10-CM

## 2016-01-20 DIAGNOSIS — R5383 Other fatigue: Secondary | ICD-10-CM | POA: Diagnosis not present

## 2016-01-20 DIAGNOSIS — C530 Malignant neoplasm of endocervix: Secondary | ICD-10-CM

## 2016-01-20 DIAGNOSIS — R05 Cough: Secondary | ICD-10-CM | POA: Diagnosis present

## 2016-01-20 LAB — COMPREHENSIVE METABOLIC PANEL
AST: 9 U/L (ref 5–34)
Albumin: 2.7 g/dL — ABNORMAL LOW (ref 3.5–5.0)
Alkaline Phosphatase: 93 U/L (ref 40–150)
Anion Gap: 10 mEq/L (ref 3–11)
BUN: 10.6 mg/dL (ref 7.0–26.0)
CALCIUM: 9.7 mg/dL (ref 8.4–10.4)
CHLORIDE: 108 meq/L (ref 98–109)
CO2: 24 mEq/L (ref 22–29)
CREATININE: 0.8 mg/dL (ref 0.6–1.1)
EGFR: 86 mL/min/{1.73_m2} — ABNORMAL LOW (ref >90)
Glucose: 120 mg/dl (ref 70–140)
Potassium: 3.8 mEq/L (ref 3.5–5.1)
Sodium: 142 mEq/L (ref 136–145)
Total Bilirubin: 0.3 mg/dL (ref 0.20–1.20)
Total Protein: 6.7 g/dL (ref 6.4–8.3)

## 2016-01-20 LAB — CBC WITH DIFFERENTIAL/PLATELET
BASO%: 0.3 % (ref 0.0–2.0)
BASOS ABS: 0 10*3/uL (ref 0.0–0.1)
EOS%: 1.9 % (ref 0.0–7.0)
Eosinophils Absolute: 0.2 10*3/uL (ref 0.0–0.5)
HEMATOCRIT: 30.1 % — AB (ref 34.8–46.6)
HGB: 9.8 g/dL — ABNORMAL LOW (ref 11.6–15.9)
LYMPH#: 0.9 10*3/uL (ref 0.9–3.3)
LYMPH%: 8.6 % — ABNORMAL LOW (ref 14.0–49.7)
MCH: 27.5 pg (ref 25.1–34.0)
MCHC: 32.6 g/dL (ref 31.5–36.0)
MCV: 84.3 fL (ref 79.5–101.0)
MONO#: 0.8 10*3/uL (ref 0.1–0.9)
MONO%: 7.5 % (ref 0.0–14.0)
NEUT#: 8.4 10*3/uL — ABNORMAL HIGH (ref 1.5–6.5)
NEUT%: 81.7 % — AB (ref 38.4–76.8)
PLATELETS: 411 10*3/uL — AB (ref 145–400)
RBC: 3.57 10*6/uL — ABNORMAL LOW (ref 3.70–5.45)
RDW: 17.2 % — ABNORMAL HIGH (ref 11.2–14.5)
WBC: 10.2 10*3/uL (ref 3.9–10.3)

## 2016-01-20 LAB — INFLUENZA PANEL BY PCR (TYPE A & B)
INFLAPCR: NEGATIVE
INFLBPCR: NEGATIVE

## 2016-01-20 MED ORDER — AZITHROMYCIN 250 MG PO TABS: ORAL_TABLET | ORAL | 0 refills | Status: DC

## 2016-01-20 MED ORDER — ALBUTEROL SULFATE (2.5 MG/3ML) 0.083% IN NEBU
2.5000 mg | INHALATION_SOLUTION | Freq: Once | RESPIRATORY_TRACT | Status: AC
  Administered 2016-01-20: 2.5 mg via RESPIRATORY_TRACT
  Filled 2016-01-20: qty 3

## 2016-01-20 MED ORDER — ALBUTEROL SULFATE (2.5 MG/3ML) 0.083% IN NEBU
INHALATION_SOLUTION | RESPIRATORY_TRACT | Status: AC
  Filled 2016-01-20: qty 3

## 2016-01-20 NOTE — Telephone Encounter (Addendum)
Pt feels like she needs a breathing treatment. Her chest is feeling tight like when she went to ER about 2 weeks. At that time they gave her a breathing tx and sent her home with an inhaler, no antibiotics. She is using her albuterol inhaler 2-3 times per day with minimal relief. She does not have a PCP. She has nonproductive cough, no fever, a little nausea, no nasal congestion, no runny nose, no post nasal drip. Chest pain with deep breath, no radiation.  She would take about 1/2 hour if instructed to come to [facility].

## 2016-01-20 NOTE — Patient Instructions (Signed)

## 2016-01-20 NOTE — Progress Notes (Signed)
@   1541 pt received nebulized albuterol per order of Selena Lesser, NP  Tolerated well.. Pt states that her breathing felt better afterwards.   Abx has been called in for her to her pharmacy-pt aware.

## 2016-01-20 NOTE — Telephone Encounter (Signed)
Spoke with [name] and told her that Dr. Marko Plume said for her to come in today to see Lisa Lesser, NP in the [facility].  She is to come for lab at 1330 and see [place]dee at 1400.  [name] verbalized understating.

## 2016-01-22 ENCOUNTER — Other Ambulatory Visit: Payer: Self-pay | Admitting: Oncology

## 2016-01-22 DIAGNOSIS — C569 Malignant neoplasm of unspecified ovary: Secondary | ICD-10-CM

## 2016-01-22 DIAGNOSIS — C541 Malignant neoplasm of endometrium: Secondary | ICD-10-CM

## 2016-01-23 ENCOUNTER — Ambulatory Visit: Payer: Medicaid Other | Admitting: Radiation Oncology

## 2016-01-23 ENCOUNTER — Encounter: Payer: Self-pay | Admitting: Oncology

## 2016-01-23 ENCOUNTER — Encounter: Payer: Self-pay | Admitting: Nurse Practitioner

## 2016-01-23 DIAGNOSIS — R059 Cough, unspecified: Secondary | ICD-10-CM | POA: Insufficient documentation

## 2016-01-23 DIAGNOSIS — R05 Cough: Secondary | ICD-10-CM | POA: Insufficient documentation

## 2016-01-23 NOTE — Assessment & Plan Note (Signed)
Patient last received chemotherapy in August 2017.  She is scheduled for her next [place] brain radiation treatment on 01/25/2016.  She continues to undergo observation only.  Otherwise.  See further notes for details of today's visit.  Patient is scheduled to return for labs, flush, and visit with Dr. Marko Plume on 01/26/2016.

## 2016-01-23 NOTE — Progress Notes (Signed)
SYMPTOM MANAGEMENT CLINIC    Chief Complaint: Cough  HPI:  [name] 61 y.o. female diagnosed with endometrial cancer; with both lung and brain metastasis.  Patient is receiving intermittent SRS brain radiation.  Otherwise-patient is receiving no chemotherapy and is under observation only.    No history exists.    Review of Systems  Constitutional: Positive for malaise/fatigue.  Respiratory: Positive for cough, shortness of breath and wheezing.   All other systems reviewed and are negative.   Past Medical History:  Diagnosis Date  . Chemotherapy-induced neuropathy ([place])   . DVT, lower extremity ([place])    left  02-12-2015  currently on Eliqius  . Endometrial cancer [facility]) oncologist-  dr Marko Plume   02-2015  . 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 Family history of breast cancer   . History of adenomatous polyp of colon    tubulovillious adenoma 09/ 2010  . History of ovarian cyst    complex  . History of radiation therapy 10/21/08-11/23/08 & 12/07/10/13/12/23 2010   ENDOMETRIOID   . History of uterine fibroid   . Hypertension   . Inguinal fluid collection 02/2015   right-drained x2  . Iron deficiency anemia    chronic severe  . Metastasis to lung ([place])   . 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 ABDOMINAL HYSTERECTOMY  2010  . APPLICATION OF WOUND VAC Right 05/12/2015   Procedure: APPLICATION OF WOUND VAC;  Surgeon: Hall Busing, MD;  Location: [facility];  Service: General;  Laterality: Right;  . COLONOSCOPY W/ POLYPECTOMY  09-27-2008  . EXPLORATORY LAPAROTOMY /  TOTAL ABDOMINAL HYSTERECTOMY/ BILATERAL SALPINOOPHORECTOMY/  PARTIAL OMENTECTOMY  03-18-2008  . INGUINAL HERNIA REPAIR Right 05/12/2015   Procedure: INCISION AND DRAINGE RIGHT INGUINAL FLUID COLLECTION ;  Surgeon: Hall Busing, MD;  Location[facility];  Service: General;  Laterality: Right;  . IR GENERIC  HISTORICAL  08/10/2015   IR US GUIDE VASC ACCESS RIGHT 08/10/2015 Jacqulynn Cadet, MD [facility]-INTERV RAD  . IR GENERIC HISTORICAL  08/10/2015   IR FLUORO GUIDE CV LINE RIGHT 08/10/2015 Jacqulynn Cadet, MD [facility]-INTERV RAD  . porta cath  06/2010   removal  . TUBAL LIGATION  1990's  . UMBILICAL HERNIA REPAIR     infant    has Essential hypertension; NONSPECIFIC ABN FINDING RAD & OTH EXAM GI TRACT; Endometrial cancer ([place]); Uterine cancer ([place]); Poor venous access; Constipation due to pain medication; Noncompliance with medication regimen; Chronic anticoagulation; Leg DVT (deep venous thromboembolism), acute ([place]); Chemotherapy-induced neuropathy ([place]); Cancer associated pain; Iron deficiency anemia due to chronic blood loss; [employer] Federation of Gyne[employer] and Obstetrics (FIGO) stage IVB epithelial ovarian cancer ([place]); Chemotherapy induced nausea and vomiting; Leg DVT (deep venous thromboembolism), chronic, left ([place]); Inguinal lymphocyst; Family history of breast cancer; Adenomatous colon polyp; Mass of right inguinal region; Genetic testing; Malignant neoplasm metastatic to lung [facility]); 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    is allergic to morphine and related and dilaudid [hydromorphone hcl].  Allergies as of 01/20/2016      Reactions   Morphine And Related Itching, Other (See Comments)   Pruritis with morphine - depends on strength  of medication   Dilaudid [hydromorphone Hcl] Itching, Nausea And Vomiting      Medication List       Accurate as of 01/20/16 11:59 PM. Always use your most recent med list.          amLODipine 5 MG tablet Commonly known as:   NORVASC Take 1 tablet (5 mg total) by mouth 2 (two) times daily.   apixaban 5 MG Tabs tablet Commonly known as:  ELIQUIS Take 1 tablet (5 mg total) by mouth 2 (two) times daily.   azithromycin 250 MG tablet Commonly known as:  ZITHROMAX Z-PAK Z pak: take as directed.   FLINTSTONES GUMMIES PO Take 1 tablet by mouth daily.   letrozole 2.5 MG tablet Commonly known as:  FEMARA Take 1 tablet (2.5 mg total) by mouth daily.   levETIRAcetam 500 MG tablet Commonly known as:  KEPPRA Take 1 tablet (500 mg total) by mouth 2 (two) times daily.   lidocaine-prilocaine cream Commonly known as:  EMLA Apply to Porta-cath 1-2 hrs prior to access as directed.   LORazepam 0.5 MG tablet Commonly known as:  ATIVAN Take 1 tablet (0.5 mg total) by mouth every 6 (six) hours as needed (nausea).   magnesium hydroxide 400 MG/5ML suspension Commonly known as:  MILK OF MAGNESIA Take 5 mLs by mouth daily as needed for mild constipation.   morphine 15 MG tablet Commonly known as:  MSIR Take 1 tablet (15 mg total) by mouth every 6 (six) hours as needed for moderate pain or severe pain.   pantoprazole 40 MG tablet Commonly known as:  PROTONIX Take 1 tablet (40 mg total) by mouth daily.   polyethylene glycol packet Commonly known as:  MIRALAX / GLYCOLAX Take 17 g by mouth daily as needed for moderate constipation.   potassium chloride 10 MEQ tablet Commonly known as:  K-DUR Take 2 tablets twice a day or as directed.   senna 8.6 MG tablet Commonly known as:  SENOKOT Take 1 tablet by mouth daily.   zolpidem 5 MG tablet Commonly known as:  AMBIEN Take 1 tablet (5 mg total) by mouth at bedtime as needed for sleep.        PHYSICAL EXAMINATION  Oncology Vitals 01/20/2016 01/16/2016  Height 158 cm -  Weight 75.978 kg 73.483 kg  Weight (lbs) 167 lbs 8 oz 162 lbs  BMI (kg/m2) 30.64 kg/m2 29.63 kg/m2  Temp 99.8 99.8  Pulse 114 116  Resp 18 12  Resp (Historical as of 08/02/11) - -  SpO2 98 99    BSA (m2) 1.82 m2 1.79 m2   BP Readings from Last 2 Encounters:  01/20/16 (!) 143/70  01/16/16 (!) 146/76    Physical Exam  Constitutional: She is oriented to person, place, and time. She appears malnourished. She appears unhealthy. She appears cachectic.  HENT:  Head: Normocephalic and atraumatic.  Mouth/Throat: Oropharynx is clear and moist.  Eyes: Conjunctivae and EOM are normal. Pupils are equal, round, and reactive to light. Right eye exhibits no discharge. Left eye exhibits no discharge. No scleral icterus.  Neck: Normal range of motion. Neck supple. No JVD present. No tracheal deviation present. No thyromegaly present.  Cardiovascular: Normal rate, regular rhythm, normal heart sounds and intact distal pulses.   Pulmonary/Chest: Effort normal and breath sounds normal. No respiratory distress. She has no wheezes. She has no rales. She exhibits no tenderness.  Occasional dry cough only.  Abdominal: Soft. Bowel sounds are normal. She exhibits no distension and no mass.  There is no tenderness. There is no rebound and no guarding.  Musculoskeletal: Normal range of motion. She exhibits no edema or tenderness.  Lymphadenopathy:    She has no cervical adenopathy.  Neurological: She is alert and oriented to person, place, and time. Gait normal.  Skin: Skin is warm and dry. No rash noted. No erythema. There is pallor.  Psychiatric: Affect normal.  Nursing note and vitals reviewed.   LABORATORY DATA:. Hospital Outpatient Visit on 01/20/2016  Component Date Value Ref Range Status  . Influenza A By PCR 01/20/2016 NEGATIVE  NEGATIVE Final  . Influenza B By PCR 01/20/2016 NEGATIVE  NEGATIVE Final   Comment: (NOTE) The Xpert Xpress Flu assay is intended as an aid in the diagnosis of  influenza and should not be used as a sole basis for treatment.  This  assay is FDA approved for nasopharyngeal swab specimens only. Nasal  washings and aspirates are unacceptable for Xpert Xpress Flu  testing.   Appointment on 01/20/2016  Component Date Value Ref Range Status  . WBC 01/20/2016 10.2  3.9 - 10.3 10e3/uL Final  . NEUT# 01/20/2016 8.4* 1.5 - 6.5 10e3/uL Final  . HGB 01/20/2016 9.8* 11.6 - 15.9 g/dL Final  . HCT 01/20/2016 30.1* 34.8 - 46.6 % Final  . Platelets 01/20/2016 411* 145 - 400 10e3/uL Final  . MCV 01/20/2016 84.3  79.5 - 101.0 fL Final  . MCH 01/20/2016 27.5  25.1 - 34.0 pg Final  . MCHC 01/20/2016 32.6  31.5 - 36.0 g/dL Final  . RBC 01/20/2016 3.57* 3.70 - 5.45 10e6/uL Final  . RDW 01/20/2016 17.2* 11.2 - 14.5 % Final  . lymph# 01/20/2016 0.9  0.9 - 3.3 10e3/uL Final  . MONO# 01/20/2016 0.8  0.1 - 0.9 10e3/uL Final  . Eosinophils Absolute 01/20/2016 0.2  0.0 - 0.5 10e3/uL Final  . Basophils Absolute 01/20/2016 0.0  0.0 - 0.1 10e3/uL Final  . NEUT% 01/20/2016 81.7* 38.4 - 76.8 % Final  . LYMPH% 01/20/2016 8.6* 14.0 - 49.7 % Final  . MONO% 01/20/2016 7.5  0.0 - 14.0 % Final  . EOS% 01/20/2016 1.9  0.0 - 7.0 % Final  . BASO% 01/20/2016 0.3  0.0 - 2.0 % Final  . Sodium 01/20/2016 142  136 - 145 mEq/L Final  . Potassium 01/20/2016 3.8  3.5 - 5.1 mEq/L Final  . Chloride 01/20/2016 108  98 - 109 mEq/L Final  . CO2 01/20/2016 24  22 - 29 mEq/L Final  . Glucose 01/20/2016 120  70 - 140 mg/dl Final  . BUN 01/20/2016 10.6  7.0 - 26.0 mg/dL Final  . Creatinine 01/20/2016 0.8  0.6 - 1.1 mg/dL Final  . Total Bilirubin 01/20/2016 0.30  0.20 - 1.20 mg/dL Final  . Alkaline Phosphatase 01/20/2016 93  40 - 150 U/L Final  . AST 01/20/2016 9  5 - 34 U/L Final  . ALT 01/20/2016 <6  0-55 U/L U/L Final  . Total Protein 01/20/2016 6.7  6.4 - 8.3 g/dL Final  . Albumin 01/20/2016 2.7* 3.5 - 5.0 g/dL Final  . Calcium 01/20/2016 9.7  8.4 - 10.4 mg/dL Final  . Anion Gap 01/20/2016 10  3 - 11 mEq/L Final  . EGFR 01/20/2016 86* >90 ml/min/1.73 m2 Final    RADIOGRAPHIC STUDIES: No results found.  ASSESSMENT/PLAN:    Uterine cancer [facility]) Patient last received chemotherapy in  August 2017.  She is scheduled for her next [place] brain radiation treatment on 01/25/2016.  She continues to undergo observation  only.  Otherwise.  See further notes for details of today's visit.  Patient is scheduled to return for labs, flush, and visit with Dr. Marko Plume on 01/26/2016.  Hypercalcemia Patient's corrected calcium has elevated to 10.74.  Elevated calcium could be secondary to progression of disease.  Dr. Marko Plume will follow up with the patient when she returns to the clinic next week.  Cough Patient states that she has had a persistent, dry, nonproductive cough for greater than one month.  She states that at times she has wheezing as well.  She reports increased shortness of breath with any exertion whatsoever.  She also feels some discomfort when she takes a deep breath.  The symptoms.  Palpation to the emergency department for further evaluation on 01/03/2016.  At that time she underwent a full evaluation which included a chest x-ray, a CT angiogram of the chest, a nebulizer treatment, and an echo.  CT angiogram of the chest was negative for pulmonary embolism.  Of note-patient continues to take eliquis as directed for previous history of DVT.  Patient was discharged with an albuterol inhaler and a Zithromax pack.  Patient is back to the [place] today with the same complaints again.  She states that she has had no fevers or chills-but it is noted.  The patient had a temperature of 99.8 while in the cancer Center today.  On exam today-patient appears fatigued and tired.  Her breath sounds were clear bilaterally; with no wheezing whatsoever.  Patient did have an occasional dry cough only.  She did not appear short of breath while at rest.  She appeared in no acute distress whatsoever.  Vital signs were essentially stable with mild tachycardia at 114.  O2 sat was 98% on room air.  Labs obtained today were essentially within normal limits as well.  Also, obtained a flu swab which was  negative.  Of note-echocardiogram obtained within the last 1-2 weeks with reported "trivial pericardial effusion only" per cardiologist.  Reviewed all findings and results with Dr. Marko Plume; and she recommended patient receive a nebulizer treatment to see if this helps the patient feel better.  Also confirm the patient still has her albuterol inhaler to use at home.  Patient will also be given a refill of the Zithromax pack as well.  Long discussion with both the patient and her family today regarding the possibility that patient's symptoms are actually secondary to her diagnosed disease.  Patient was advised to call/return or go directly to the emergency department over the weekend the worsening symptoms whatsoever.   Patient stated understanding of all instructions; and was in agreement with this plan of care. The patient knows to call the clinic with any problems, questions or concerns.   Total time spent with patient was 40 minutes;  with greater than 75 percent of that time spent in face to face counseling regarding patient's symptoms,  and coordination of care and follow up.  Disclaimer:This dictation was prepared with Dragon/digital dictation along with [employer]. Any transcriptional errors that result from this process are unintentional.  Drue Second, NP 01/23/2016

## 2016-01-23 NOTE — Assessment & Plan Note (Signed)
Patient states that she has had a persistent, dry, nonproductive cough for greater than one month.  She states that at times she has wheezing as well.  She reports increased shortness of breath with any exertion whatsoever.  She also feels some discomfort when she takes a deep breath.  The symptoms.  Palpation to the emergency department for further evaluation on 01/03/2016.  At that time she underwent a full evaluation which included a chest x-ray, a CT angiogram of the chest, a nebulizer treatment, and an echo.  CT angiogram of the chest was negative for pulmonary embolism.  Of note-patient continues to take eliquis as directed for previous history of DVT.  Patient was discharged with an albuterol inhaler and a Zithromax pack.  Patient is back to the [place] today with the same complaints again.  She states that she has had no fevers or chills-but it is noted.  The patient had a temperature of 99.8 while in the cancer Center today.  On exam today-patient appears fatigued and tired.  Her breath sounds were clear bilaterally; with no wheezing whatsoever.  Patient did have an occasional dry cough only.  She did not appear short of breath while at rest.  She appeared in no acute distress whatsoever.  Vital signs were essentially stable with mild tachycardia at 114.  O2 sat was 98% on room air.  Labs obtained today were essentially within normal limits as well.  Also, obtained a flu swab which was negative.  Of note-echocardiogram obtained within the last 1-2 weeks with reported "trivial pericardial effusion only" per cardiologist.  Reviewed all findings and results with Dr. Marko Plume; and she recommended patient receive a nebulizer treatment to see if this helps the patient feel better.  Also confirm the patient still has her albuterol inhaler to use at home.  Patient will also be given a refill of the Zithromax pack as well.  Long discussion with both the patient and her family today regarding the  possibility that patient's symptoms are actually secondary to her diagnosed disease.  Patient was advised to call/return or go directly to the emergency department over the weekend the worsening symptoms whatsoever.

## 2016-01-23 NOTE — Assessment & Plan Note (Signed)
Patient's corrected calcium has elevated to 10.74.  Elevated calcium could be secondary to progression of disease.  Dr. Marko Plume will follow up with the patient when she returns to the clinic next week.

## 2016-01-25 ENCOUNTER — Ambulatory Visit: Payer: Medicaid Other | Admitting: Radiation Oncology

## 2016-01-26 ENCOUNTER — Other Ambulatory Visit (HOSPITAL_BASED_OUTPATIENT_CLINIC_OR_DEPARTMENT_OTHER): Payer: Medicaid Other

## 2016-01-26 ENCOUNTER — Ambulatory Visit (HOSPITAL_BASED_OUTPATIENT_CLINIC_OR_DEPARTMENT_OTHER): Payer: Medicaid Other

## 2016-01-26 ENCOUNTER — Telehealth: Payer: Self-pay

## 2016-01-26 ENCOUNTER — Ambulatory Visit (HOSPITAL_BASED_OUTPATIENT_CLINIC_OR_DEPARTMENT_OTHER): Payer: Medicaid Other | Admitting: Oncology

## 2016-01-26 VITALS — BP 139/82 | HR 114 | Temp 99.1°F | Resp 18 | Ht 62.0 in | Wt 165.1 lb

## 2016-01-26 DIAGNOSIS — C569 Malignant neoplasm of unspecified ovary: Secondary | ICD-10-CM

## 2016-01-26 DIAGNOSIS — C7931 Secondary malignant neoplasm of brain: Secondary | ICD-10-CM | POA: Diagnosis not present

## 2016-01-26 DIAGNOSIS — D649 Anemia, unspecified: Secondary | ICD-10-CM | POA: Diagnosis not present

## 2016-01-26 DIAGNOSIS — Z7901 Long term (current) use of anticoagulants: Secondary | ICD-10-CM | POA: Diagnosis not present

## 2016-01-26 DIAGNOSIS — C7800 Secondary malignant neoplasm of unspecified lung: Secondary | ICD-10-CM | POA: Diagnosis not present

## 2016-01-26 DIAGNOSIS — G620 Drug-induced polyneuropathy: Secondary | ICD-10-CM | POA: Diagnosis not present

## 2016-01-26 DIAGNOSIS — C541 Malignant neoplasm of endometrium: Secondary | ICD-10-CM | POA: Diagnosis not present

## 2016-01-26 DIAGNOSIS — Z86718 Personal history of other venous thrombosis and embolism: Secondary | ICD-10-CM

## 2016-01-26 DIAGNOSIS — E876 Hypokalemia: Secondary | ICD-10-CM

## 2016-01-26 DIAGNOSIS — Z95828 Presence of other vascular implants and grafts: Secondary | ICD-10-CM

## 2016-01-26 LAB — COMPREHENSIVE METABOLIC PANEL
AST: 9 U/L (ref 5–34)
Albumin: 2.8 g/dL — ABNORMAL LOW (ref 3.5–5.0)
Alkaline Phosphatase: 112 U/L (ref 40–150)
Anion Gap: 12 mEq/L — ABNORMAL HIGH (ref 3–11)
BILIRUBIN TOTAL: 0.29 mg/dL (ref 0.20–1.20)
BUN: 10.2 mg/dL (ref 7.0–26.0)
CHLORIDE: 108 meq/L (ref 98–109)
CO2: 21 meq/L — AB (ref 22–29)
CREATININE: 0.8 mg/dL (ref 0.6–1.1)
Calcium: 9.6 mg/dL (ref 8.4–10.4)
EGFR: >90 mL/min/{1.73_m2} (ref >90)
Glucose: 101 mg/dl (ref 70–140)
Potassium: 3.7 mEq/L (ref 3.5–5.1)
Sodium: 141 mEq/L (ref 136–145)
TOTAL PROTEIN: 6.7 g/dL (ref 6.4–8.3)

## 2016-01-26 LAB — CBC WITH DIFFERENTIAL/PLATELET
BASO%: 0.6 % (ref 0.0–2.0)
Basophils Absolute: 0.1 10*3/uL (ref 0.0–0.1)
EOS ABS: 0.3 10*3/uL (ref 0.0–0.5)
EOS%: 3.9 % (ref 0.0–7.0)
HEMATOCRIT: 29.2 % — AB (ref 34.8–46.6)
HGB: 9.7 g/dL — ABNORMAL LOW (ref 11.6–15.9)
LYMPH%: 10.8 % — ABNORMAL LOW (ref 14.0–49.7)
MCH: 27 pg (ref 25.1–34.0)
MCHC: 33.2 g/dL (ref 31.5–36.0)
MCV: 81.3 fL (ref 79.5–101.0)
MONO#: 0.6 10*3/uL (ref 0.1–0.9)
MONO%: 7.2 % (ref 0.0–14.0)
NEUT%: 77.5 % — ABNORMAL HIGH (ref 38.4–76.8)
NEUTROS ABS: 6.3 10*3/uL (ref 1.5–6.5)
NRBC: 0 % (ref 0–0)
PLATELETS: 462 10*3/uL — AB (ref 145–400)
RBC: 3.59 10*6/uL — AB (ref 3.70–5.45)
RDW: 17.2 % — AB (ref 11.2–14.5)
WBC: 8.2 10*3/uL (ref 3.9–10.3)
lymph#: 0.9 10*3/uL (ref 0.9–3.3)

## 2016-01-26 MED ORDER — HEPARIN SOD (PORK) LOCK FLUSH 100 UNIT/ML IV SOLN
500.0000 [IU] | Freq: Once | INTRAVENOUS | Status: AC | PRN
  Administered 2016-01-26: 500 [IU] via INTRAVENOUS
  Filled 2016-01-26: qty 5

## 2016-01-26 MED ORDER — SODIUM CHLORIDE 0.9% FLUSH
10.0000 mL | INTRAVENOUS | Status: DC | PRN
  Administered 2016-01-26: 10 mL via INTRAVENOUS
  Filled 2016-01-26: qty 10

## 2016-01-26 MED ORDER — PROCHLORPERAZINE MALEATE 10 MG PO TABS: 10.0000 mg | ORAL_TABLET | Freq: Four times a day (QID) | ORAL | 0 refills | Status: DC | PRN

## 2016-01-26 NOTE — Telephone Encounter (Signed)
-----   Message from Gordy Levan, MD sent at 01/26/2016  2:24 PM EST ----- Regarding: prochlorperazine Generic compazine 10 mg every 6 hrs prn nausea   #20

## 2016-01-26 NOTE — Telephone Encounter (Signed)
Let pt know [facility], clarified w/pt [facility] off [facility] profile and deleted it.

## 2016-01-26 NOTE — Telephone Encounter (Signed)
[facility] [address] per pt request.

## 2016-01-26 NOTE — Progress Notes (Signed)
OFFICE PROGRESS NOTE   January 26, 2016   Physicians: E.Rossi, J.Kinard/ M. 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  She and family have refused discussion of palliative care / Hospice or DNR, with patient cared for by family at home and doing remarkably well thus far despite advanced, incurable malignancy. She has wanted to continue some treatment, fortunately tolerating letrozole since 08-2015.  She has progression of a cerebellar met and a new left frontal lobe met by MRI brain 01-05-16. Additional SRS is scheduled by Dr Tammi Klippel for 02-01-16. She had radiation to left lung 09-2015. Last chemo was taxotere thru 05-2015, then one cycle gemzar 08-2015.    She was seen in ED 01-03-16 with SOB, CTA chest done,  improved with albuterol neb. Echocardiogram done 01-09-16 no significant pericardial effusion.  She was seen at [facility] symptom management clinic 01-20-16 also with respiratory complaints, which symptomatically improved with nebulizer and additionally given Z pack.  She denies increased SOB,cough, wheezing now. She ambulates a little at home, mostly on couch during day. Family assists with all activity/ ADLs.  Earlier this week she walked in place for 1/2 mile  using a video program, subsequently had new pain left lower chest with deep inspiration. Appetite is good, some nausea today, using ativan at least 2 times daily in past 2 weeks for nausea. No other antiemetics at home, but agrees to compazine as this should be less sedating than ativan. Denies GERD or esophagitis symptoms. She denies HA or new neurologic symptoms, is recently off decadron per rad onc. Bowels are moving. She is able to sleep. No problems with PAC. No  increased swelling LE. No bleeding. No fever or symptoms of infection. No problems right inguinal area where drainage of necrotic tumor mass by Dr Rolanda Jay 05-2015 Remainder of 14 point Review of Systems negative.    PAC placed by IR on 08-10-15, flushed 01-26-16 OnEliquis for LLE DVT 02-2015 Genetics testing negative Breast Ovarian and gyn panel by [facility] 06-14-15.  CA 125 on 02-14-15 642.9 ER + on cytology 02-2015 Feraheme 02-16-15 and [id number]. Refuses flu vaccine  Family is extremely attentive. Other daughter is pediatrician in [place]. Patient has refused to get PCP; cardiology managing antihypertensives.   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 She was lost to follow up for the gyn cancer after 11-2010, until presented with recurrent disease. Patient reported right inguinal mass for ~ 2 months, then LLE pain for ~ 3 weeks when she presented to ED on 02-12-15. CT AP showed nodules in lung bases suspicious for metastatic disease, liver not remarkable, no hydronephrosis, 6 mm right renal calculus, thrombus left external iliac vein into left common femoral vein, retroperitoneal and pelvic adenopathy, an 8.5 x 7.1 cm necrotic right inguinal node, post hyst ooph, no ascites, likely tumor involvement in left psoas with adjacent involvement of L4 and L5 vertebral bodies; CT chest subsequently had multiple bilateral pulmonary nodules and central adenopathy. Patient was begun on heparin qtt which was transitioned  to Eliquis (on pharmaceutical assistance program). CA 125 from 02-14-15 was 642.9, this having been 3.6 in 08-2010. Pathology from right inguinal nodal mass aspiration and  core needle biopsy 02-15-15 necrotic material with metastatic adenocarcinoma with immunostains consistent with gyn primary ([id number], ER+). She was transfused 2 units PRBCs for hgb 6.7. Iron studies 2-12 with serum iron 14 and %sat 7, given feraheme x2. Dr Denman George saw in consultation prior to starting carboplatin taxotere on 02-24-15. She reacted to [place] skin test prior to cycle 3, treatment continued with taxotere only thru cycle 5 on 05-26-15, tolerated neulasta poorly. She had I&D and capsulectomy of seroma cavity right inguinal region on 05-12-15. CT CAP 06-14-15 significant improvement in all areas of metastatic disease with exception of enlarging cystic mass in left hilum. Echocardiogram 07-19-15 done to evaluate central chest mass: EF 60-65% and minimal pericardial effusion. PET 08-01-15 showedsignificant progression of multiple areas of disease in addition to the chest involvement. She had first gemzar 08-11-15, then began letrozole on 08-18-15, due to concomitant chest radiation, ~ 37 cGy given thru ~ 09-14-15. She was found to have single left cerebellar metastasis 09-28-15, post SRS on 10-07-15. She has progression of cerebellar met and new left frontal met 01-2016.     Objective:  Vital signs in last 24 hours:  BP 139/82 (BP Location: Left Arm, Patient Position: Sitting)   Pulse (!) 114 Comment: told RN cathy  Temp 99.1 F (37.3 C) (Oral)   Resp 18   Ht _0  (1.575 m)   Wt 165 lb 1.6 oz (74.9 kg)   SpO2 98%   BMI 30.20 kg/m  Weight stable. Looks chronically ill but not in any acute discomfort now.   Alert, oriented and appropriate. In [facility], able to reposition herself for exam. Respirations not labored RA  HEENT:PERRL, sclerae not icteric. Oral mucosa moist without lesions, posterior pharynx clear.  Neck supple. No JVD. Mucous membranes a little pale, not icteric Lymphatics:no supraclavicular adenopathy Resp: Decreased BS thru left chest, no wheezes or rubs, no use of accessory muscles. BS  more clear on right.  Cardio: tachy, regular rate and rhythm, clear heart sounds. No gallop. GI: abdomen soft, nontender, not distended. Cannot clearly tell liver edge with exam in WC. Normally active bowel sounds. Not tender in epigastrium Musculoskeletal/ Extremities: LE without pitting edema, cords, tenderness Neuro: speech fluent and appropriate. No obvious CN abnormalities. Moves all extremities. No tremor. PSYCH cheerful affect Skin without rash, ecchymosis, petechiae Portacath-without erythema or tenderness  Lab Results:  Results for orders placed or performed in visit on 01/26/16  CBC with Differential  Result Value Ref Range   WBC 8.2 3.9 - 10.3 10e3/uL   NEUT# 6.3 1.5 - 6.5 10e3/uL   HGB 9.7 (L) 11.6 - 15.9 g/dL   HCT 29.2 (L) 34.8 - 46.6 %   Platelets 462 (H) 145 - 400 10e3/uL   MCV 81.3 79.5 - 101.0 fL   MCH 27.0 25.1 - 34.0 pg   MCHC 33.2 31.5 - 36.0 g/dL   RBC 3.59 (L) 3.70 - 5.45 10e6/uL   RDW 17.2 (H) 11.2 - 14.5 %   lymph# 0.9 0.9 - 3.3 10e3/uL   MONO# 0.6 0.1 - 0.9 10e3/uL   Eosinophils Absolute 0.3 0.0 - 0.5 10e3/uL   Basophils Absolute 0.1 0.0 - 0.1 10e3/uL   NEUT% 77.5 (H) 38.4 - 76.8 %   LYMPH% 10.8 (L) 14.0 - 49.7 %   MONO% 7.2 0.0 - 14.0 %   EOS% 3.9 0.0 -  7.0 %   BASO% 0.6 0.0 - 2.0 %   nRBC 0 0 - 0 %  Comprehensive metabolic panel  Result Value Ref Range   Sodium 141 136 - 145 mEq/L   Potassium 3.7 3.5 - 5.1 mEq/L   Chloride 108 98 - 109 mEq/L   CO2 21 (L) 22 - 29 mEq/L   Glucose 101 70 - 140 mg/dl   BUN 10.2 7.0 - 26.0 mg/dL   Creatinine 0.8 0.6 - 1.1 mg/dL   Total Bilirubin 0.29 0.20 - 1.20 mg/dL   Alkaline Phosphatase 112 40 - 150 U/L   AST 9 5 - 34 U/L   ALT <6 0-55 U/L U/L   Total Protein 6.7 6.4 - 8.3 g/dL   Albumin 2.8 (L) 3.5 - 5.0 g/dL   Calcium 9.6 8.4 - 10.4 mg/dL   Anion Gap 12 (H) 3 - 11 mEq/L   EGFR >90 >90 ml/min/1.73 m2   CA 125 available after visit higher at 212, having been 92 in 12-2915 and 171 in  08-2015.  Studies/Results: MRI HEAD WITHOUT AND WITH CONTRAST  01-05-16  COMPARISON:  Prior MRI from 09/28/2015.  FINDINGS: Brain: Known left cerebellar metastatic lesion again seen, increased in size measuring 2.4 x 2.6 x 2.0 cm on today's study (series 10, image 33, previously 2.0 x 2.3 x 2.0 cm). Associated vasogenic edema within the adjacent left cerebellar hemisphere is not significantly changed. Similar mass effect on the adjacent fourth ventricle which remains patent. No evidence for obstructive hydrocephalus.  There has been interval development of a new second metastatic lesion, position within the left periventricular white matter just above the left lateral ventricle in the left frontal lobe. Lesion measures 1.3 x 1.3 x 1.1 cm (series 10, image 103). Localized vasogenic edema without significant mass effect. Associated susceptibility artifact suggestive of chronic blood products, similar to previous.  No other intracranial metastasis or other mass lesion identified. No midline shift. No hydrocephalus. No extra-axial fluid collection. No abnormal foci of restricted diffusion to suggest acute or subacute ischemia. Underlying atrophy with cerebral white matter disease noted.  Incidental note made of an empty sella.  Vascular: Left vertebral artery not visualize, likely hypoplastic and possibly occluded. Major intracranial vascular flow voids otherwise maintained.  Skull and upper cervical spine: Craniocervical junction within normal limits. Visualized upper cervical spine unremarkable. Bone marrow signal intensity within normal limits. No scalp soft tissue abnormality.  Sinuses/Orbits: Globes and orbital soft tissues within normal limits. Paranasal sinuses are clear. Small right mastoid effusion. Inner ear structures normal.  IMPRESSION: 1. Interval increase in size of left cerebellar metastasis, now measuring 2.4 x 2.6 x 2.0 cm. Similar localized edema and  mass effect on the adjacent fourth ventricle. Fourth ventricle remains patent without obstructive hydrocephalus. 2. Interval development of a second 1.3 x 1.3 x 1.1 cm metastatic lesion within the left periventricular white matter of the left frontal lobe. Mild localized edema without significant mass effect.     Echocardiogram 01-09-16: done in follow up of CTA chest (report below) trivial pericardial effusion. EF 60-65%    CT ANGIOGRAPHY CHEST WITH CONTRAST 01-03-16 done in ED  COMPARISON:  09/23/2015  FINDINGS: Cardiovascular: The heart size is moderately enlarged. There is a pericardial effusion which appears increased in volume from previous exam. The main pulmonary artery is patent. No saddle embolus. The segmental and subsegmental pulmonary arteries within the left lung are obscured by motion artifact. The proximal portions of the left upper lobe and left lower lobe  lobar pulmonary arteries appear patent without embolus. No evidence for pulmonary emboli to the right lung.  Mediastinum/Nodes: The trachea appears patent and is midline. Normal appearance of the esophagus. Index left-sided pre-vascular lymph node measures 2.6 cm, image 103 of series 10. Previously 3.0 cm. Large cystic mass within the AP window region on the left is again noted. This measures 7.5 x 6.4 cm, image 128 of series 10. Previously 7.2 x 6.6 cm.  Lungs/Pleura: There is a small left pleural effusion which is similar in volume to the previous exam. Small right effusion is new from prior study. Left upper lobe lung mass measures 2.2 x 3.4 cm, image 34 of series 11. On the previous exam this measured 4.4 x 4.3 cm. Index nodule within the superior segment of right lower lobe measures 1.5 cm, image 49 of series 11. Previously 0.9 cm.  Upper Abdomen: No acute abnormality noted.  Musculoskeletal: No aggressive bone lesions.  Review of the MIP images confirms the above findings.  IMPRESSION: 1.  Exam detail is diminished secondary to motion artifact. This predominantly affects the segmental pulmonary arteries of the left lung. Within this limitation no evidence for acute pulmonary embolus noted. 2. Increase in volume of left pericardial. 3. Mixed response to therapy with respect to mediastinal adenopathy and pulmonary lesions. The dominant left upper lobe lung mass is decreased in size in the interval. There is a nodule within the right lower lobe which is increased in size in the interval. 4. Pre-vascular lymph node is mildly decreased in size in the interval. Large necrotic cystic nodal mass within the AP window region is stable in the interval.   Medications: I have reviewed the patient's current medications. #30 ativan by Dr Tammi Klippel on 01-16-16, requesting refill today. Have decided instead to try compazine 10 mg q 6 hr prn nausea #20. Off HCTZ. Decrease K to 20 mEq daily May need MDI at home, as this was mentioned in ED note but does not show on today's med list  DISCUSSION Patient and family are aware of progression of brain mets and do not have questions in this regard now, plan to proceed with SRS by Dr Tammi Klippel.  Clinically she looks stable, and she and family are cheerful and seem accepting of present status.  Not discussed, patient was pleased previously with option of letrozole instead of chemotherapy. With progression of brain mets, extent of disease otherwise and PS 3, it does not seem likely that chemotherapy would improve situation overall. As she had synchronous ovarian and endometrial carcinomas at diagnosis, could consider tamoxifen alternating with megace if decide to change hormonal therapy (already on Eliquis).   I have offered patient follow up thru [facility] med onc office in [facility], however she is now living in [place] and prefers follow up at [facility], as she continues care with radiation oncology here. Will request visit with Dr Alvy Bimler coordinating with PAC  flush in March.    Assessment/Plan:  1.Extensively metastatic adenocarcinoma ovarian and or endometrial : tolerating  letrozole in palliative attempt, additional SRS planned to progressive and new brain mets. Radiation and other interventions have been helpful with symptoms. Treatement considerations as above. PS 3, family very attentive at home, no other services assisting at home now.  History of IC clear cell ovarian and IA high grade endometrial carcinomas 03-2008 (incomplete staging), post TAH BSO, adjuvant carboplatin taxol x 6 cycles and radiation. Lost to follow up 11-2010 until presented with metastatic gyn cancer 02-2015 ,ER + cytology. Initial  response to [place] taxotere, then [place] reaction and progressive peripheral neuropathy with taxane. Extensive progression including left chest, gemzar x 1 given 8-10 -17 then held forradiation to left lung. Letrozole begun 08-18-15. Solitary left cerebellar met, post SRS 10-07-15, on steroid taper.  Post I&D with capsulectomy of seroma cavity 05-12-15, healed, adenocarcinoma in that path. Genetics testing negative  2.DNR RESCINDEDduring last hospitalization. She has widely metastatic malignancy which is incurable, all interventions are in palliative attempt from medical standpoint. Patient and family have refused further discussion at recent visits.  3. Extensive LLE DVT documented 02-12-15: no longer symptomatic, on Eliquis. No IVC filter. Eliquis approved thru [employer] assistance x 1 year.  4. Bradycardia and HTN after SRS, adjustments in antihypertensives during hospitalization then.Uncontrolled HTN at presentation, now managed by Dr Harrington Challenger 5. Refuses flu vaccine 6 .Itching with morphine, nausea with dilaudid. 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  hypoK : despite KCl probably 20 mEq daily. Off HCTZ. Decrease K to 20 mEq daily. 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 Repeat colonoscopy recommended 10-2009, apparently not done. Note iron deficiency, post feraheme x 2.. FOB not yet done despite multiple requests; no overt GI bleeding (on Eliquis).  13.Anemia: multifactorial, hgb stable at 9.7 today. Not more symptomatic. Follow  All questions answered and they know to call if needed prior to next scheduled appointment. Time spent 25 min including >50% counseling and coordination of care. Cc Dr Tammi Klippel    Evlyn Clines, MD   01/26/2016, 2:00 PM

## 2016-01-27 ENCOUNTER — Encounter: Payer: Self-pay | Admitting: *Deleted

## 2016-01-27 LAB — CA 125: Cancer Antigen (CA) 125: 211.9 U/mL — ABNORMAL HIGH (ref 0.0–38.1)

## 2016-01-27 NOTE — Progress Notes (Signed)
0900 Called left a message in reference to Ativan refill renewal and she should receive a call from [facility] today for pick of the medication later today.  I let her know that Dr. Tammi Klippel is aware that she is taking the medication normally twice a day now and not four times a day.   Dr. Tammi Klippel  wants you to know you should not need to continue using  Ativan long term and  should continue to decrease the usage of the medication.

## 2016-01-29 ENCOUNTER — Encounter: Payer: Self-pay | Admitting: Oncology

## 2016-01-30 ENCOUNTER — Encounter: Payer: Self-pay | Admitting: Oncology

## 2016-01-30 ENCOUNTER — Encounter: Payer: Self-pay | Admitting: Radiation Oncology

## 2016-01-30 ENCOUNTER — Other Ambulatory Visit: Payer: Self-pay

## 2016-01-30 DIAGNOSIS — C541 Malignant neoplasm of endometrium: Secondary | ICD-10-CM

## 2016-01-30 MED ORDER — LEVETIRACETAM 500 MG PO TABS: 500.0000 mg | ORAL_TABLET | Freq: Two times a day (BID) | ORAL | 1 refills | Status: DC

## 2016-01-31 ENCOUNTER — Encounter: Payer: Self-pay | Admitting: Oncology

## 2016-02-01 ENCOUNTER — Ambulatory Visit
Admission: RE | Admit: 2016-02-01 | Discharge: 2016-02-01 | Disposition: A | Discharge status: Home / Self Care | Payer: Medicaid Other | Source: Ambulatory Visit | Attending: Radiation Oncology | Admitting: Radiation Oncology

## 2016-02-01 ENCOUNTER — Encounter: Payer: Self-pay | Admitting: Radiation Oncology

## 2016-02-01 VITALS — BP 146/83 | HR 106 | Temp 98.7°F

## 2016-02-01 DIAGNOSIS — Z510 Encounter for antineoplastic radiation therapy: Secondary | ICD-10-CM | POA: Diagnosis not present

## 2016-02-01 DIAGNOSIS — C7931 Secondary malignant neoplasm of brain: Secondary | ICD-10-CM

## 2016-02-01 NOTE — Progress Notes (Signed)
  Radiation Oncology         [phone] ________________________________  Stereotactic Treatment Procedure Note  Name: [name] MRN: [id number]  Date: 02/01/2016  DOB: 06-19-1955  SPECIAL TREATMENT PROCEDURE    ICD-9-CM ICD-10-CM   1. Metastasis to brain ([place]) 198.3 C79.31     3D TREATMENT PLANNING AND DOSIMETRY:  The patient's radiation plan was reviewed and approved by neurosurgery and radiation oncology prior to treatment.  It showed 3-dimensional radiation distributions overlaid onto the planning CT/MRI image set.  The [facility] for the target structures as well as the organs at risk were reviewed. The documentation of the 3D plan and dosimetry are filed in the radiation oncology EMR.  NARRATIVE:  [name] was brought to the TrueBeam stereotactic radiation treatment machine and placed supine on the CT couch. The head frame was applied, and the patient was set up for stereotactic radiosurgery.  Neurosurgery was present for the set-up and delivery  SIMULATION VERIFICATION:  In the couch zero-angle position, the patient underwent Exactrac imaging using the Brainlab system with orthogonal KV images.  These were carefully aligned and repeated to confirm treatment position for each of the isocenters.  The Exactrac snap film verification was repeated at each couch angle.  PROCEDURE: [name] received stereotactic radiosurgery to the following targets: Left frontal 13 mm target was treated using 3 Dynamic Conformal Arcs to a prescription dose of 20 Gy.  ExacTrac registration was performed for each couch angle.  The 80.8 % isodose line was prescribed.  6 MV X-rays were delivered in the flattening filter free beam mode.  STEREOTACTIC TREATMENT MANAGEMENT:  Following delivery, the patient was transported to nursing in stable condition and monitored for possible acute effects.  Vital signs were recorded BP (!) 146/83 (BP Location: Left Arm, Patient Position: Sitting)   Pulse  (!) 106   Temp 98.7 F (37.1 C) (Oral)   SpO2 100% . The patient tolerated treatment without significant acute effects, and was discharged to home in stable condition.    PLAN: Follow-up in one month.  ________________________________  Sheral Apley. Tammi Klippel, M.D.

## 2016-02-01 NOTE — Progress Notes (Signed)
Patient is here for post [facility] monitoring.  She denies having a headache, nausea or pain.  Her family is present in the room.

## 2016-02-01 NOTE — Progress Notes (Signed)
Patient continues to deny having pain or headache.  She was discharged from the clinic in a wheelchair with her family.

## 2016-02-03 NOTE — Progress Notes (Signed)
  Radiation Oncology         [phone] ________________________________  Name: [name] MRN: [id number]  Date: 02/01/2016  DOB: 09-04-1955  End of Treatment Note  Diagnosis:   61 yo woman with a new left frontal 13 mm brain metastasis from uterine adenocarcinoma     Indication for treatment:  Palliative       Radiation treatment dates:   02/01/2016  Site/dose:   PTV2 Left frontal 13 mm was treated to 20 Gy in 1 fraction.   Beams/energy:   SBRT/SRT-3D // 6FFF  Narrative: The patient tolerated radiation treatment relatively well.   The patient did not experience any acute side effects with SRS treatment.  Plan: The patient has completed radiation treatment. The patient will return to radiation oncology clinic for routine followup in one month. I advised her to call or return sooner if she has any questions or concerns related to her recovery or treatment. ________________________________  Sheral Apley. Tammi Klippel, M.D.  This document serves as a record of services personally performed by Tyler Pita, MD. It was created on his behalf by Arlyce Harman, a trained medical scribe. The creation of this record is based on the scribe's personal observations and the provider's statements to them. This document has been checked and approved by the attending provider.

## 2016-02-10 ENCOUNTER — Telehealth: Payer: Self-pay | Admitting: Radiation Oncology

## 2016-02-10 NOTE — Telephone Encounter (Signed)
Per Dr. Johny Shears order phoned patient back and explained she should present to the emergency room for evaluation of edema, pain, and fever in her right leg. Explained these symptoms could be indicative of a blood clot, lymphadenopathy from her uterine cancer or cellulitis. Patient verbalized understanding and expressed appreciation for the return call.

## 2016-02-10 NOTE — Telephone Encounter (Signed)
-----   Message from Pincus Large sent at 02/02/2016  9:44 AM EST ----- Regarding: pt called to complain about pain in her Rt. Leg As you know, Ms. Booz was treated yesterday with SRS. She is doing fine where that is concerned, no complaints at all. However, she does want to report that she has an "excruciating pain" in her Rt. Leg. It is not swollen or red. No injury has occurred, and the pain is not dependent on motion. She reports that it hurts when she "lays, walks, stands and sits down."   I let her know that I would pass this information on to the nurse to have her access this as well.  Thank you Krystal Clark

## 2016-02-10 NOTE — Telephone Encounter (Signed)
Phoned patient to inquire about status. She confirms that her right leg "excrucitating pain" continues. She reports her right leg is swollen compared to the left. She reports her right leg feels fevered compared to her left. She denies any recent falls or injury. She reports the pain is unchanged with motion. Explained this isn't related to her [facility] treatment. Encouraged her to follow up with her PCP. She reports she doesn't have a PCP. She reports calling Dr. Mariana Kaufman office but, no receiving a return call reference this matter. Patient understands this RN will inform her provider and phone her back with directions.

## 2016-02-11 ENCOUNTER — Encounter: Payer: Self-pay | Admitting: Hematology and Oncology

## 2016-02-13 ENCOUNTER — Encounter: Payer: Self-pay | Admitting: Hematology and Oncology

## 2016-02-14 ENCOUNTER — Other Ambulatory Visit: Payer: Self-pay | Admitting: Oncology

## 2016-02-14 DIAGNOSIS — E876 Hypokalemia: Secondary | ICD-10-CM

## 2016-02-14 DIAGNOSIS — C541 Malignant neoplasm of endometrium: Secondary | ICD-10-CM

## 2016-02-14 NOTE — Op Note (Signed)
  Name: [name]  MRN: [id number]  Date: 02/01/2016   DOB: 06-04-1955  Stereotactic Radiosurgery Operative Note  PRE-OPERATIVE DIAGNOSIS:  Solitary Brain Metastasis  POST-OPERATIVE DIAGNOSIS:  Solitary Brain Metastasis  PROCEDURE:  Stereotactic Radiosurgery  SURGEON:  Peggyann Shoals, MD  NARRATIVE: The patient underwent a radiation treatment planning session in the radiation oncology simulation suite under the care of the radiation oncology physician and physicist.  I participated closely in the radiation treatment planning afterwards. The patient underwent planning CT which was fused to 3T high resolution MRI with 1 mm axial slices.  These images were fused on the planning system.  We contoured the gross target volumes and subsequently expanded this to yield the Planning Target Volume. I actively participated in the planning process.  I helped to define and review the target contours and also the contours of the optic pathway, eyes, brainstem and selected nearby organs at risk.  All the dose constraints for critical structures were reviewed and compared to [employer] Task Group 101.  The prescription dose conformity was reviewed.  I approved the plan electronically.    Accordingly, [name] was brought to the TrueBeam stereotactic radiation treatment linac and placed in the custom immobilization mask.  The patient was aligned according to the IR fiducial markers with Brain[facility] Exactrac, then orthogonal x-rays were used in ExacTrac with the 6DOF robotic table and the shifts were made to align the patient  [name] received stereotactic radiosurgery uneventfully.    The detailed description of the procedure is recorded in the radiation oncology procedure note.  I was present for the duration of the procedure.  DISPOSITION:  Following delivery, the patient was transported to nursing in stable condition and monitored for possible acute effects to be discharged to home in stable  condition with follow-up in one month.  Peggyann Shoals, MD 02/14/2016 1:52 PM

## 2016-02-14 NOTE — Addendum Note (Signed)
Encounter addended by: Erline Levine, MD on: 02/14/2016  1:52 PM<BR>    Actions taken: Sign clinical note

## 2016-02-15 ENCOUNTER — Telehealth: Payer: Self-pay

## 2016-02-15 NOTE — Telephone Encounter (Signed)
Daughter Andee Poles called to requested refill Morphine 15 mg tablets.

## 2016-02-16 ENCOUNTER — Telehealth: Payer: Self-pay

## 2016-02-16 NOTE — Telephone Encounter (Signed)
Called patient back, she will be here tomorrow for 1200 to see Dr. Alvy Bimler.

## 2016-02-16 NOTE — Telephone Encounter (Signed)
Called and left message to see if patient could come tomorrow for appt with Dr. Alvy Bimler at 1200 noon tomorrow.

## 2016-02-16 NOTE — Telephone Encounter (Signed)
Since sjhe was never seen by me, I need to see her first. Can she come in tomorrow at 12 pm? Please place urgent msg for 30 mins

## 2016-02-17 ENCOUNTER — Ambulatory Visit (HOSPITAL_BASED_OUTPATIENT_CLINIC_OR_DEPARTMENT_OTHER): Payer: Medicaid Other | Admitting: Hematology and Oncology

## 2016-02-17 ENCOUNTER — Telehealth: Payer: Self-pay | Admitting: Hematology and Oncology

## 2016-02-17 DIAGNOSIS — Z7189 Other specified counseling: Secondary | ICD-10-CM

## 2016-02-17 DIAGNOSIS — I82502 Chronic embolism and thrombosis of unspecified deep veins of left lower extremity: Secondary | ICD-10-CM | POA: Diagnosis not present

## 2016-02-17 DIAGNOSIS — R630 Anorexia: Secondary | ICD-10-CM

## 2016-02-17 DIAGNOSIS — C7800 Secondary malignant neoplasm of unspecified lung: Secondary | ICD-10-CM | POA: Diagnosis not present

## 2016-02-17 DIAGNOSIS — Z8543 Personal history of malignant neoplasm of ovary: Secondary | ICD-10-CM | POA: Diagnosis not present

## 2016-02-17 DIAGNOSIS — C7931 Secondary malignant neoplasm of brain: Secondary | ICD-10-CM

## 2016-02-17 DIAGNOSIS — C541 Malignant neoplasm of endometrium: Secondary | ICD-10-CM

## 2016-02-17 DIAGNOSIS — G893 Neoplasm related pain (acute) (chronic): Secondary | ICD-10-CM

## 2016-02-17 DIAGNOSIS — C778 Secondary and unspecified malignant neoplasm of lymph nodes of multiple regions: Secondary | ICD-10-CM

## 2016-02-17 MED ORDER — MORPHINE SULFATE 30 MG PO TABS: 30.0000 mg | ORAL_TABLET | Freq: Four times a day (QID) | ORAL | 0 refills | Status: DC | PRN

## 2016-02-17 MED ORDER — MEGESTROL ACETATE 40 MG PO TABS: 40.0000 mg | ORAL_TABLET | Freq: Two times a day (BID) | ORAL | 1 refills | Status: DC

## 2016-02-17 NOTE — Telephone Encounter (Signed)
Appointments scheduled per 2/16 LOS. Patient given AVS report and calendars with future scheduled appointments. °

## 2016-02-19 ENCOUNTER — Encounter: Payer: Self-pay | Admitting: Hematology and Oncology

## 2016-02-19 DIAGNOSIS — Z7189 Other specified counseling: Secondary | ICD-10-CM | POA: Insufficient documentation

## 2016-02-19 NOTE — Assessment & Plan Note (Signed)
She has minimum pain. Continue pain medicine as needed.

## 2016-02-19 NOTE — Assessment & Plan Note (Signed)
The patient has previously refused discussion about goals of care. Family members and the patient wished to have stage IV, incurable disease. I will discuss plans of care further with the patient in the next visit due to complexity of her case today that I ran out of time to discuss this further. For now, I plan to focus on symptom management. I encouraged the patient to participate in activities of daily living such as cooking which she enjoyed in the past.

## 2016-02-19 NOTE — Assessment & Plan Note (Addendum)
She has completed recent palliative radiation treatment. Plan repeat imaging study in a few months

## 2016-02-19 NOTE — Progress Notes (Signed)
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 female was transferred to my care after her prior physician has left.  I reviewed the patient's records extensive and collaborated the history with the patient. Summary of her history is as follows:   Endometrial cancer ([place])   02/02/2008 Imaging    Uterine fibroid disease with dominant 5.4 cm fundal fibroid. 5 cm complex cystic abnormality of the left ovary/adnexa requiring follow-up ultrasound.  Cystic neoplasm cannot be excluded.  Follow-up is recommended in 6 to 8 weeks. 4 cm complex cyst of the right ovary.  This should also be followed with the left ovarian abnormality      03/03/2008 Imaging    Three uterine fibroids.  Largest measures up to 6.4 cm. Fluid within the endometrial canal with endometrial lining thickness 9.9 mm. Septated fluid containing structure adjacent to the right ovary may represent hydrosalpinx.  Ovarian mass cannot be excluded.  Given this finding, persistent vaginal bleeding and previously described adnexal abnormalities, MR of the pelvis with attention to the uterus ovary is recommended.      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 RIGHT OVARY, EXCISION: - HIGH GRADE SURFACE EPITHELIAL CARCINOMA, 2.7 CM, CONFINED WITHIN RIGHT OVARY. - NO ANGIOLYMPHATIC INVASION IDENTIFIED. - MATURE CYSTIC TERATOMA. - FALLOPIAN TUBE: NO PATHOLOGIC ABNORMALITIES. - PLEASE SEE ONCOLOGY  TEMPLATE  2. 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 - ANGIOLYMPHATIC INVASION PRESENT. - LEIOMYOMATA. - CERVIX: BENIGN SQUAMOUS MUCOSA AND ENDOCERVICAL MUCOSA, NO DYSPLASIA OR MALIGNANCY IDENTIFIED.  LEFT OVARY: - HIGH GRADE SURFACE EPITHELIAL CARCINOMA, 5.5 CM, CAPSULE RUPTURED; NO ANGIOLYMPHATIC INVASION SEEN. - FALLOPIAN TUBAL TISSUE WITH PARATUBAL CYST. - PLEASE SEE ONCOLOGY TEMPLATE.  3. OMENTUM, RESECTION: NEGATIVE FOR MALIGNANCY.      03/18/2008 Pathology Results    PERITONEAL WASHING (THIN PREPARATION AND CELL BLOCK): POSITIVE FOR ADENOCARCINOMA      09/21/2008 PET scan    FDG avid ascending colonic intraluminal mass, worrisome for colonic neoplasm.  The adjacent right pericolic gutter soft tissue nodule does not demonstrate F D G avidity, possibly due to its small size, but remains worrisome due to its proximity to the intraluminal mass and history of ovarian cancer. No abnormal F D G uptake in the neck, chest, or abdomen. Mild asymmetric soft tissue prominence of the anterior right parapharyngeal soft tissues, amenable to direct visualization.       12/23/2008 Miscellaneous    The patient completed external beam radiation therapy and high-dose rate brachytherapy as part of management of her stage IC endometrioid adenocarcinoma.  The patient completed her radiation treatments on 12/23/2008       02/12/2015 Relapse/Recurrence    Patient reported right inguinal mass and LLE pain when she presented to ED on 02-12-15, all due to disease relapse      02/12/2015 Imaging    Status post hysterectomy. 8.5 x 7.1 cm necrotic right inguinal  node, corresponding to the palpable abnormality, suspicious for nodal metastasis. Additional retroperitoneal/ left pelvic nodal metastases, as above. 6.1 cm low-density lesion in the left psoas  muscle, favored to reflect tumor, less likely infection. Secondary involvement of the left L4 and L5 vertebral bodies. Associated acute deep venous thrombosis involving the left external iliac vein and extending into the left lower extremity, likely secondary to extrinsic compression by pelvic tumor. Pulmonary nodules/metastases at the lung bases, measuring up to 2.4 cm in the left lower lobe.      02/14/2015 Procedure    Aspirate and core biopsy performed of enlarged and necrotic right inguinal lymph node. Aspirated fluid was sent for cytologic analysis. Solid tissue was sent for histopathologic analysis.       02/14/2015 Pathology Results    Diagnosis Lymph node, needle/core biopsy, right inguinal METASTATIC CARCINOMA WITH PREDOMINANT COAGULATIVE NECROSIS CONSISTENT WITH GYN PRIMARY Microscopic Comment The biopsy consist of metastatic carcinoma with coagulative necrosis. The neoplasm stains positive for ck7 and ER, supporting these are GYN primary.      02/14/2015 Pathology Results    Diagnosis LYMPH NODE, FINE NEEDLE ASPIRATION, RIGHT INGUINAL(SPECIMEN 1 OF 1 COLLECTED 02/14/15): MALIGNANT CELLS CONSISTENT WITH METASTATIC ADENOCARCINOMA.      02/14/2015 Tumor Marker    Patient's tumor was tested for the following markers: CA125 Results of the tumor marker test revealed 642.9      02/24/2015 - 05/26/2015 Chemotherapy    She received a few cycles of carboplatin and Taxotere      03/14/2015 Tumor Marker    Patient's tumor was tested for the following markers: CA125 Results of the tumor marker test revealed 427.8      04/12/2015 Procedure    Successful ultrasound-guided aspiration of a total of approximately 180 cc of brown colored fluid from symptomatic necrotic right inguinal lymph node. Note, if the cystic component of this lymph node reoccurs in the come symptomatic, the patient may benefit from a surgical resection      04/21/2015 Tumor Marker    Patient's tumor was tested for  the following markers: CA125 Results of the tumor marker test revealed 111.9      05/12/2015 Pathology Results    Debridement, right inguinal - ADENOCARCINOMA ASSOCIATED WITH HEMATOMA. - SEE MICROSCOPIC DESCRIPTION. Microscopic Comment Within the dermis and subcutaneous tissue there is a hemorrhagic cavity surrounded by granulation tissue and abundant hemosiderin deposition consistent with hematoma. In the adjacent connective tissue there are cystic glands with papillary protrusions consistent with adenocarcinoma. The morphologic features are most consistent with the previous ovarian carcinoma from 2010 ([id number]). (JDP:gt, 05/13/15)       05/12/2015 Pathology Results    CYTOLOGY FLUID: RIGHT INGUINAL FLUID(SPECIMEN 1 OF 1 COLLECTED 05/12/15):Marland Kitchen ABUNDANT BLOOD AND RARE ATYPICAL CELLS SUSPICIOUS FOR CARCINOMA      05/12/2015 Surgery    Procedure: Incision and drainage of right inguinal fluid collection with capsulectomy of seroma cavity Right inguinal wound VAC placement (10 x 9 x 6 cm) Operative Findings: Liquefied thick maroon colored fluid without odor. Well-defined capsule around the fluid collection. After resection of capsule there was adequate tissue overlying the vessels, therefore mobilization of sartorius was not necessary.      05/23/2015 Tumor Marker    Patient's tumor was tested for the following markers: CA125 Results of the tumor marker test revealed 48      06/14/2015 Imaging    Significant interval decrease in size of bilateral pulmonary nodules and masses as well as retroperitoneal, pelvic and inguinal adenopathy.  Mass within the left upper lobe may represent metastatic disease or primary pulmonary malignancy. Interval increase in size of large cystic mass within the left hilum. Interval sclerosis of the L4 and L5 vertebral body metastasis.       06/14/2015 Genetic Testing    Genetics testing negative Breast Ovarian and gyn panel by [facility] 06-14-15.      07/07/2015 Tumor  Marker    Patient's tumor was tested for the following markers: CA125 Results of the tumor marker test revealed 86.6      08/01/2015 PET scan    Interval progression, as described above. Dominant 7.8 cm left upper lobe mass abuts the mediastinum. Additional left upper lobe/ left lower lobe masses and thoracic lymphadenopathy. Additional retroperitoneal/left pelvic nodal metastases. Postsurgical changes in the right inguinal region with mild residual hypermetabolism.      08/10/2015 Procedure    Successful placement of a right IJ approach Power Port with ultrasound and fluoroscopic guidance. The catheter is ready for use.      08/11/2015 Miscellaneous    She received 1 dose of gemzar      08/18/2015 Tumor Marker    Patient's tumor was tested for the following markers: CA125 Results of the tumor marker test revealed 171      08/19/2015 - 02/17/2016 Anti-estrogen oral therapy    She was placed on Letrozole, stopped due to progression of disease      08/24/2015 - 09/15/2015 Radiation Therapy    08/24/2015-09/15/2015: Left Lung, 37.5 Gy in 15 fractions by Dr. Sondra Come.       09/23/2015 Imaging    Decrease in some of the pulmonary metastatic lesions as described above. The mass lesion abutting the mediastinum on the left has decreased in size from the prior exam. New mild pericardial effusion. Increase in size in a pelvic lymphadenopathy in the iliac chain. The left psoas mass and periaortic lymph node as well as changes in the right inguinal region are relatively stable as described. No new focal abnormality is seen.      09/28/2015 Imaging    Cerebellar edema centrally on the left with suggestion of an underlying 3.5 cm left cerebellar mass. Mass effect on the fourth ventricle but no ventriculomegaly or loss of basilar cistern patency. 2. Cerebellar metastatic disease favored in this clinical setting. Follow-up Brain MRI without and with contrast would characterize further. 3. No other acute or  metastatic intracranial abnormality identified.      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 Vasogenic edema with mild mass effect on the fourth ventricle, no obstructive hydrocephalus. Otherwise negative MRI of the head for age.       10/07/2015 Imaging    Known left cerebellar metastasis with vasogenic edema. Subjectively less mass effect on the adjacent fourth ventricle, albeit minimal.      10/11/2015 - 10/11/2015 Radiation Therapy    10/07/15: PTV1 Lt Cerebellar 23 mm was treated to 18 Gy in one fraction by Dr. Tammi Klippel.      10/31/2015 Tumor Marker    Patient's tumor was tested for the following markers: CA125 Results of the tumor marker test revealed 82.3      12/12/2015 Tumor Marker    Patient's tumor was tested for the following markers: CA125 Results of the tumor marker test revealed 92.5      01/03/2016 Imaging    Exam detail is diminished secondary to motion artifact. This predominantly affects the segmental pulmonary arteries of the left  lung. Within this limitation no evidence for acute pulmonary embolus noted. 2. Increase in volume of left pericardial. 3. Mixed response to therapy with respect to mediastinal adenopathy and pulmonary lesions. The dominant left upper lobe lung mass is decreased in size in the interval. There is a nodule within the right lower lobe which is increased in size in the interval. 4. Pre-vascular lymph node is mildly decreased in size in the interval. Large necrotic cystic nodal mass within the AP window region is stable in the interval.      01/05/2016 Imaging    Interval increase in size of left cerebellar metastasis, now measuring 2.4 x 2.6 x 2.0 cm. Similar localized edema and mass effect on the adjacent fourth ventricle. Fourth ventricle remains patent without obstructive hydrocephalus. 2. Interval development of a second 1.3 x 1.3 x 1.1 cm metastatic lesion within the left periventricular white matter of the  left frontal lobe. Mild localized edema without significant mass effect.       01/09/2016 Imaging    LV EF: 60% -   65%      01/26/2016 Tumor Marker    Patient's tumor was tested for the following markers: CA125 Results of the tumor marker test revealed 211.9      02/01/2016 - 02/01/2016 Radiation Therapy    Left frontal 13 mm target was treated using 3 Dynamic Conformal Arcs to a prescription dose of 20 Gy.      She is here today accompanied by her daughter. She has recently completed radiation treatment. She complained of fatigue. Denies pain. She continues to have chronic weakness.  She does not do much at home. She admits she is mildly depressed. No suicidal ideation. No recent falls. Appetite is poor. She denies new recent lymphadenopathy. The patient denies any recent signs or symptoms of bleeding such as spontaneous epistaxis, hematuria or hematochezia. She denies recent cough, chest pain or shortness of breath  MEDICAL HISTORY:  Past Medical History:  Diagnosis Date  . Chemotherapy-induced neuropathy ([place])   . DVT, lower extremity ([place])    left  02-12-2015  currently on Eliqius  . Endometrial cancer [facility]) oncologist-  dr Marko Plume   02-2015  . 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 Family history of breast cancer   . History of adenomatous polyp of colon    tubulovillious adenoma 09/ 2010  . History of ovarian cyst    complex  . History of radiation therapy 10/21/08-11/23/08 & 12/07/10/13/12/23 2010   ENDOMETRIOID   . History of uterine fibroid   . Hypertension   . Inguinal fluid collection 02/2015   right-drained x2  . Iron deficiency anemia    chronic severe  . Metastasis to lung ([place])   . 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 ABDOMINAL HYSTERECTOMY  2010  . APPLICATION OF WOUND VAC Right 05/12/2015   Procedure: APPLICATION OF WOUND VAC;  Surgeon: Hall Busing, MD;   Location: [facility];  Service: General;  Laterality: Right;  . COLONOSCOPY W/ POLYPECTOMY  09-27-2008  . EXPLORATORY LAPAROTOMY /  TOTAL ABDOMINAL HYSTERECTOMY/ BILATERAL SALPINOOPHORECTOMY/  PARTIAL OMENTECTOMY  03-18-2008  . INGUINAL HERNIA REPAIR Right 05/12/2015   Procedure: INCISION AND DRAINGE RIGHT INGUINAL FLUID COLLECTION ;  Surgeon: Hall Busing, MD;  Location: [facility];  Service: General;  Laterality: Right;  . IR GENERIC HISTORICAL  08/10/2015   IR US GUIDE  VASC ACCESS RIGHT 08/10/2015 Jacqulynn Cadet, MD [facility]-INTERV RAD  . IR GENERIC HISTORICAL  08/10/2015   IR FLUORO GUIDE CV LINE RIGHT 08/10/2015 Jacqulynn Cadet, MD [facility]-INTERV RAD  . porta cath  06/2010   removal  . TUBAL LIGATION  1990's  . UMBILICAL HERNIA REPAIR     infant    SOCIAL HISTORY: Social History   Social History  . Marital status: Married    Spouse name: N/A  . Number of children: 3  . Years of education: N/A   Occupational History  . Not on file.   Social History Main Topics  . Smoking status: Never Smoker  . Smokeless tobacco: Never Used  . Alcohol use No  . Drug use: No  . Sexual activity: Not on file   Other Topics Concern  . Not on file   Social History Narrative  . No narrative on file    FAMILY HISTORY: Family History  Problem Relation Age of Onset  . Bone cancer Paternal Grandmother     OSSEOUS METASTASIS  . Heart disease Father   . Heart disease Sister   . Bone cancer Maternal Grandmother 75  . Leukemia Maternal Grandfather 34  . Liver cancer Maternal Uncle   . Breast cancer Cousin 27    maternal first cousin    ALLERGIES:  is allergic to morphine and related and dilaudid [hydromorphone hcl].  MEDICATIONS:  Current Outpatient Prescriptions  Medication Sig Dispense Refill  . amLODipine (NORVASC) 5 MG tablet Take 1 tablet (5 mg total) by mouth 2 (two) times daily. 60 tablet 3  . apixaban (ELIQUIS) 5 MG TABS tablet Take 1 tablet (5 mg total) by  mouth 2 (two) times daily. 180 tablet 1  . letrozole (FEMARA) 2.5 MG tablet Take 1 tablet (2.5 mg total) by mouth daily. 30 tablet 1  . levETIRAcetam (KEPPRA) 500 MG tablet Take 1 tablet (500 mg total) by mouth 2 (two) times daily. 60 tablet 1  . LORazepam (ATIVAN) 0.5 MG tablet Take 1 tablet (0.5 mg total) by mouth every 6 (six) hours as needed (nausea). 30 tablet 0  . magnesium hydroxide (MILK OF MAGNESIA) 400 MG/5ML suspension Take 5 mLs by mouth daily as needed for mild constipation.    . pantoprazole (PROTONIX) 40 MG tablet Take 1 tablet (40 mg total) by mouth daily. 30 tablet 2  . Pediatric Multivit-Minerals-C (FLINTSTONES GUMMIES PO) Take 1 tablet by mouth daily.    . polyethylene glycol (MIRALAX / GLYCOLAX) packet Take 17 g by mouth daily as needed for moderate constipation.    . senna (SENOKOT) 8.6 MG tablet Take 1 tablet by mouth daily.    Marland Kitchen zolpidem (AMBIEN) 5 MG tablet Take 1 tablet (5 mg total) by mouth at bedtime as needed for sleep. 30 tablet 1  . lidocaine-prilocaine (EMLA) cream Apply to Porta-cath 1-2 hrs prior to access as directed. (Patient not taking: Reported on 02/17/2016) 30 g 1  . megestrol (MEGACE) 40 MG tablet Take 1 tablet (40 mg total) by mouth 2 (two) times daily. 60 tablet 1  . morphine (MSIR) 30 MG tablet Take 1 tablet (30 mg total) by mouth every 6 (six) hours as needed for moderate pain or severe pain. 60 tablet 0  . potassium chloride (K-DUR) 10 MEQ tablet Take 2 tablets twice a day or as directed. (Patient not taking: Reported on 02/17/2016) 120 tablet 0  . prochlorperazine (COMPAZINE) 10 MG tablet Take 1 tablet (10 mg total) by mouth every 6 (six) hours as  needed for nausea or vomiting. (Patient not taking: Reported on 02/17/2016) 20 tablet 0   No current facility-administered medications for this visit.     REVIEW OF SYSTEMS:   Constitutional: Denies fevers, chills or abnormal night sweats Eyes: Denies blurriness of vision, double vision or watery eyes Ears,  nose, mouth, throat, and face: Denies mucositis or sore throat Respiratory: Denies cough, dyspnea or wheezes Cardiovascular: Denies palpitation, chest discomfort or lower extremity swelling Gastrointestinal:  Denies nausea, heartburn or change in bowel habits Skin: Denies abnormal skin rashes Lymphatics: Denies new lymphadenopathy or easy bruising Behavioral/Psych: Mood is stable, no new changes  All other systems were reviewed with the patient and are negative.  PHYSICAL EXAMINATION: ECOG PERFORMANCE STATUS: 2 - Symptomatic, <50% confined to bed  Vitals:   02/17/16 1158  BP: (!) 142/77  Pulse: 99  Resp: 18  Temp: 98.4 F (36.9 C)   Filed Weights   02/17/16 1158  Weight: 161 lb 9.6 oz (73.3 kg)    GENERAL:alert, no distress and comfortable.  She appears comfortable, sitting on the wheelchair SKIN: skin color, texture, turgor are normal, no rashes or significant lesions EYES: normal, conjunctiva are pink and non-injected, sclera clear OROPHARYNX:no exudate, normal lips, buccal mucosa, and tongue  NECK: supple, thyroid normal size, non-tender, without nodularity LYMPH:  no palpable lymphadenopathy in the cervical, axillary or inguinal LUNGS: clear to auscultation and percussion with normal breathing effort HEART: regular rate & rhythm and no murmurs with mild lower extremity edema ABDOMEN:abdomen soft, non-tender and normal bowel sounds Musculoskeletal:no cyanosis of digits and no clubbing  PSYCH: alert & oriented x 3 with fluent speech NEURO: no focal motor/sensory deficits.  Full neurological examination is not performed due to her weakness  LABORATORY DATA:  I have reviewed the data as listed Lab Results  Component Value Date   WBC 8.2 01/26/2016   HGB 9.7 (L) 01/26/2016   HCT 29.2 (L) 01/26/2016   MCV 81.3 01/26/2016   PLT 462 (H) 01/26/2016    Recent Labs  09/29/15 0515 09/30/15 0530  01/03/16 1411 01/20/16 1402 01/26/16 1222  NA 136 137  < > 140 142 141  K  3.7 3.4*  < > 3.5 3.8 3.7  CL 103 109  --  105  --   --   CO2 21* 22  < > 26 24 21*  GLUCOSE 121* 130*  < > 126* 120 101  BUN 6 12  < > 14 10.6 10.2  CREATININE 0.76 0.95  < > 0.65 0.8 0.8  CALCIUM 8.9 8.2*  < > 9.1 9.7 9.6  GFRNONAA >60 >60  --  >60  --   --   GFRAA >60 >60  --  >60  --   --   PROT  --   --   < > 6.8 6.7 6.7  ALBUMIN  --   --   < > 3.1* 2.7* 2.8*  AST  --   --   < > 31 9 9   ALT  --   --   < > 37 <6 <6  ALKPHOS  --   --   < > 100 93 112  BILITOT  --   --   < > 0.4 0.30 0.29  < > = values in this interval not displayed.  RADIOGRAPHIC STUDIES: I have personally reviewed a few of her previous radiological images   ASSESSMENT & PLAN:  Endometrial cancer ([place]) She has significant, extensive history. She has extensive metastatic disease. The  patient had refused to discuss palliative care/hospice in the past. I recommend discontinuation of letrozole due to recent progressive disease. She currently have poor appetite. I recommend a trial of Megace that might help with her appetite, along with possible positive effect on her tumor. I am aware that that might increase the risk of blood clot but currently she is on chronic anticoagulation therapy. She is willing to try  Cancer associated pain She has minimum pain. Continue pain medicine as needed.  Leg DVT (deep venous thromboembolism), chronic, left ([place]) She is currently on chronic anticoagulation therapy.  Plan to continue indefinitely due to poor mobility and heavy burden of disease  Malignant neoplasm metastatic to lung [facility]) She is currently asymptomatic.  Continue close surveillance imaging  Metastasis to brain [facility]) She has completed recent palliative radiation treatment. Plan repeat imaging study in a few months  Goals of care, counseling/discussion The patient has previously refused discussion about goals of care. Family members and the patient wished to have stage IV, incurable disease. I will discuss  plans of care further with the patient in the next visit due to complexity of her case today that I ran out of time to discuss this further. For now, I plan to focus on symptom management. I encouraged the patient to participate in activities of daily living such as cooking which she enjoyed in the past.   Orders Placed This Encounter  Procedures  . CBC with Differential/Platelet    Standing Status:   Standing    Number of Occurrences:   22    Standing Expiration Date:   02/18/2017  . Comprehensive metabolic panel    Standing Status:   Standing    Number of Occurrences:   22    Standing Expiration Date:   02/18/2017  . CA 125    Standing Status:   Standing    Number of Occurrences:   22    Standing Expiration Date:   02/18/2017    All questions were answered. The patient knows to call the clinic with any problems, questions or concerns. I spent 60 minutes counseling the patient face to face. The total time spent in the appointment was 80 minutes and more than 50% was on counseling.     Heath Lark, MD 02/19/2016 9:35 AM

## 2016-02-19 NOTE — Assessment & Plan Note (Signed)
She is currently on chronic anticoagulation therapy.  Plan to continue indefinitely due to poor mobility and heavy burden of disease

## 2016-02-19 NOTE — Assessment & Plan Note (Signed)
She is currently asymptomatic.  Continue close surveillance imaging

## 2016-02-19 NOTE — Assessment & Plan Note (Signed)
She has significant, extensive history. She has extensive metastatic disease. The patient had refused to discuss palliative care/hospice in the past. I recommend discontinuation of letrozole due to recent progressive disease. She currently have poor appetite. I recommend a trial of Megace that might help with her appetite, along with possible positive effect on her tumor. I am aware that that might increase the risk of blood clot but currently she is on chronic anticoagulation therapy. She is willing to try

## 2016-02-23 NOTE — Telephone Encounter (Signed)
Error ts

## 2016-02-27 ENCOUNTER — Other Ambulatory Visit: Payer: Self-pay | Admitting: *Deleted

## 2016-02-27 DIAGNOSIS — I82502 Chronic embolism and thrombosis of unspecified deep veins of left lower extremity: Secondary | ICD-10-CM

## 2016-02-27 MED ORDER — APIXABAN 5 MG PO TABS: 5.0000 mg | ORAL_TABLET | Freq: Two times a day (BID) | ORAL | 1 refills | Status: DC

## 2016-03-01 ENCOUNTER — Other Ambulatory Visit: Payer: Self-pay

## 2016-03-01 DIAGNOSIS — C541 Malignant neoplasm of endometrium: Secondary | ICD-10-CM

## 2016-03-01 MED ORDER — PANTOPRAZOLE SODIUM 40 MG PO TBEC: 40.0000 mg | DELAYED_RELEASE_TABLET | Freq: Every day | ORAL | 11 refills | Status: AC

## 2016-03-05 ENCOUNTER — Encounter: Payer: Self-pay | Admitting: Radiation Oncology

## 2016-03-05 ENCOUNTER — Ambulatory Visit
Admission: RE | Admit: 2016-03-05 | Discharge: 2016-03-05 | Disposition: A | Discharge status: Home / Self Care | Payer: Medicaid Other | Source: Ambulatory Visit | Attending: Radiation Oncology | Admitting: Radiation Oncology

## 2016-03-05 VITALS — BP 153/86 | HR 126 | Temp 98.8°F | Resp 18 | Wt 152.2 lb

## 2016-03-05 DIAGNOSIS — C7931 Secondary malignant neoplasm of brain: Secondary | ICD-10-CM

## 2016-03-05 DIAGNOSIS — C55 Malignant neoplasm of uterus, part unspecified: Secondary | ICD-10-CM | POA: Diagnosis not present

## 2016-03-05 DIAGNOSIS — R000 Tachycardia, unspecified: Secondary | ICD-10-CM | POA: Diagnosis not present

## 2016-03-05 DIAGNOSIS — C541 Malignant neoplasm of endometrium: Secondary | ICD-10-CM

## 2016-03-05 DIAGNOSIS — K5900 Constipation, unspecified: Secondary | ICD-10-CM | POA: Insufficient documentation

## 2016-03-05 NOTE — Progress Notes (Signed)
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 Tammi Klippel.  10/07/2015 [facility] Treatment: PTV1 Lt Cerebellar 23 mm was treated to 18 Gy in one fraction by Dr. Tammi Klippel.  08/24/2015-09/15/2015: Left Lung, 37.5 Gy in 15 fractions by Dr. Sondra Come.  2010: The patient completed external beam radiation therapy and high-dose rate brachytherapy as part of management of her stage IC endometrioid adenocarcinoma.  The patient completed her radiation treatments on 12/23/2008 by Dr. Sondra Come.  Narrative:  The patient returns today for routine follow-up, post treatment. She tolerated therapy well without difficulty. She does not take any steroids. She has plans follow up with Dr. Alvy Bimler this week.                             On review of systems, the patient states she is doing okay. She reports difficulty sleeping last night due to constipation. Daughter confirms the patient had a bowel movement this morning. Weight loss of 9 lbs since 02/17/16 noted. Daughter reports the patient is eating well and taking in more protein. The patient states she has a loss of appetite, even with taking Megace. She does not feel hungry and does not believe she is taking in enough fluids. States her urine is light yellow in color. Heart rate is elevated but she denies any chest pain, or shortness of breath. She denies any concerns with pain. She reports diplopia and unsteady gait. She denies tinnitus, nausea, vomiting, or headache. She reported intermittent difficulty with short term recall to our nurse, but when asked again, she  denies this, and her family does not state awareness of this. She denies difficulty with word finding. She reports weakness and fatigue, as well as intermittent left femur pain with ambulation. No other complaints are verbalized.  ALLERGIES:  is allergic to morphine and related and dilaudid [hydromorphone hcl].  Meds: Current Outpatient Prescriptions  Medication Sig Dispense Refill  . amLODipine (NORVASC) 5 MG tablet Take 1 tablet (5 mg total) by mouth 2 (two) times daily. 60 tablet 3  . apixaban (ELIQUIS) 5 MG TABS tablet Take 1 tablet (5 mg total) by mouth 2 (two) times daily. 180 tablet 1  . levETIRAcetam (KEPPRA) 500 MG tablet Take 1 tablet (500 mg total) by mouth 2 (two) times daily. 60 tablet 1  . megestrol (MEGACE) 40 MG tablet Take 1 tablet (40 mg total) by mouth 2 (two) times daily. 60 tablet 1  . morphine (MSIR) 30 MG tablet Take 1 tablet (30 mg total) by mouth every 6 (six) hours as needed for moderate pain or severe pain. 60 tablet 0  . pantoprazole (PROTONIX) 40 MG tablet Take 1 tablet (40 mg total) by mouth daily. 30 tablet 11  . Pediatric Multivit-Minerals-C (FLINTSTONES GUMMIES PO) Take 1 tablet by mouth daily.    . polyethylene glycol (MIRALAX / GLYCOLAX) packet Take 17 g by mouth daily as needed for moderate constipation.    . senna (SENOKOT) 8.6 MG tablet Take 1 tablet by mouth daily.    Marland Kitchen  VOLTAREN 1 % GEL APPLY TO AFFECTED AREA EVERY 8 TO 12 HOURS AS NEEDED FOR PAIN  4  . zolpidem (AMBIEN) 5 MG tablet Take 1 tablet (5 mg total) by mouth at bedtime as needed for sleep. 30 tablet 1  . benzonatate (TESSALON) 100 MG capsule TK ONE C PO  BID PRN  0  . letrozole (FEMARA) 2.5 MG tablet Take 1 tablet (2.5 mg total) by mouth daily. (Patient not taking: Reported on 03/05/2016) 30 tablet 1  . lidocaine-prilocaine (EMLA) cream Apply to Porta-cath 1-2 hrs prior to access as directed. (Patient not taking: Reported on 02/17/2016) 30 g 1  . LORazepam (ATIVAN) 0.5 MG tablet Take 1 tablet (0.5 mg  total) by mouth every 6 (six) hours as needed (nausea). (Patient not taking: Reported on 03/05/2016) 30 tablet 0  . magnesium hydroxide (MILK OF MAGNESIA) 400 MG/5ML suspension Take 5 mLs by mouth daily as needed for mild constipation.    . potassium chloride (K-DUR) 10 MEQ tablet Take 2 tablets twice a day or as directed. (Patient not taking: Reported on 02/17/2016) 120 tablet 0  . prochlorperazine (COMPAZINE) 10 MG tablet Take 1 tablet (10 mg total) by mouth every 6 (six) hours as needed for nausea or vomiting. (Patient not taking: Reported on 02/17/2016) 20 tablet 0   No current facility-administered medications for this encounter.     Physical Findings:  weight is 152 lb 3.2 oz (69 kg). Her oral temperature is 98.8 F (37.1 C). Her blood pressure is 153/86 (abnormal) and her pulse is 126 (abnormal). Her respiration is 18 and oxygen saturation is 97%.  Pain Assessment Pain Score: 0-No pain/10 In general this is a chronically ill appearing african-american in no acute distress. She presents in wheelchair. She's alert and oriented x4 and appropriate throughout the examination. Cardiopulmonary assessment is negative for acute distress and she exhibits normal effort.   Lab Findings: Lab Results  Component Value Date   WBC 8.2 01/26/2016   HGB 9.7 (L) 01/26/2016   HCT 29.2 (L) 01/26/2016   MCV 81.3 01/26/2016   PLT 462 (H) 01/26/2016     Radiographic Findings: No results found.  Impression/Plan: 2. 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 The patient was encouraged to supplement her diet with Ensure/Boost and to increase fluid intake to avoid dehydration. The patient will be scheduled for a repeat Brain MRI in April and will return for follow up to discuss these results. She is scheduled to follow up with Dr. Alvy Bimler on 03/07/2016.  2. Tachycardia. The patient is asymptomatic from this. She appears to be tolerating oral intake, and from what she  describes with her urinary habits, she seems to be well hydrated. She will have labs performed later this week, and was encouraged to continue to push oral hydration. She will be evaluated urgently if she becomes symptomatic.    Carola Rhine, PAC  This document serves as a record of services personally performed by Tyler Pita, MD and Shona Simpson, PA-C. It was created on their behalf by Arlyce Harman, a trained medical scribe. The creation of this record is based on the scribe's personal observations and the provider's statements to them. This document has been checked and approved by the attending provider.

## 2016-03-05 NOTE — Progress Notes (Signed)
Weight loss noted. Daughter reports the patient is eating well and taking in more protein. Heart rate elevated. Patient denies pain. Patient reports difficulty sleeping last night due to constipation. Daughter confirms the patient had a bowel movement this morning. Reports diplopia. Denies tinnitus. Reports an unsteady gait. Denies nausea or vomiting. Denies headache. Reports intermittent difficulty with short term recall. Denies difficulty with word finding. Reports weakness and fatigue. Reports intermittent left femur pain with ambulation. Also, patient reports her left upper leg appears larger than her right. No fever felt in left or right upper leg.   BP (!) 153/86 (BP Location: Left Arm, Patient Position: Sitting, Cuff Size: Normal)   Pulse (!) 126   Temp 98.8 F (37.1 C) (Oral)   Resp 18   Wt 152 lb 3.2 oz (69 kg)   SpO2 97%   BMI 27.84 kg/m  Wt Readings from Last 3 Encounters:  03/05/16 152 lb 3.2 oz (69 kg)  02/17/16 161 lb 9.6 oz (73.3 kg)  01/26/16 165 lb 1.6 oz (74.9 kg)

## 2016-03-06 ENCOUNTER — Other Ambulatory Visit: Payer: Self-pay | Admitting: Cardiology

## 2016-03-07 ENCOUNTER — Other Ambulatory Visit (HOSPITAL_BASED_OUTPATIENT_CLINIC_OR_DEPARTMENT_OTHER): Payer: Medicaid Other

## 2016-03-07 ENCOUNTER — Ambulatory Visit (HOSPITAL_BASED_OUTPATIENT_CLINIC_OR_DEPARTMENT_OTHER): Payer: Medicaid Other | Admitting: Hematology and Oncology

## 2016-03-07 ENCOUNTER — Encounter: Payer: Self-pay | Admitting: Hematology and Oncology

## 2016-03-07 ENCOUNTER — Ambulatory Visit (HOSPITAL_BASED_OUTPATIENT_CLINIC_OR_DEPARTMENT_OTHER): Payer: Medicaid Other

## 2016-03-07 DIAGNOSIS — C7800 Secondary malignant neoplasm of unspecified lung: Secondary | ICD-10-CM | POA: Diagnosis not present

## 2016-03-07 DIAGNOSIS — C778 Secondary and unspecified malignant neoplasm of lymph nodes of multiple regions: Secondary | ICD-10-CM

## 2016-03-07 DIAGNOSIS — C541 Malignant neoplasm of endometrium: Secondary | ICD-10-CM

## 2016-03-07 DIAGNOSIS — D630 Anemia in neoplastic disease: Secondary | ICD-10-CM

## 2016-03-07 DIAGNOSIS — C7931 Secondary malignant neoplasm of brain: Secondary | ICD-10-CM

## 2016-03-07 DIAGNOSIS — C7951 Secondary malignant neoplasm of bone: Secondary | ICD-10-CM

## 2016-03-07 DIAGNOSIS — I82502 Chronic embolism and thrombosis of unspecified deep veins of left lower extremity: Secondary | ICD-10-CM | POA: Diagnosis not present

## 2016-03-07 DIAGNOSIS — R634 Abnormal weight loss: Secondary | ICD-10-CM

## 2016-03-07 DIAGNOSIS — Z95828 Presence of other vascular implants and grafts: Secondary | ICD-10-CM

## 2016-03-07 DIAGNOSIS — E876 Hypokalemia: Secondary | ICD-10-CM | POA: Diagnosis not present

## 2016-03-07 DIAGNOSIS — G893 Neoplasm related pain (acute) (chronic): Secondary | ICD-10-CM

## 2016-03-07 DIAGNOSIS — K5903 Drug induced constipation: Secondary | ICD-10-CM | POA: Diagnosis not present

## 2016-03-07 LAB — COMPREHENSIVE METABOLIC PANEL
ALBUMIN: 2.8 g/dL — AB (ref 3.5–5.0)
ALK PHOS: 86 U/L (ref 40–150)
ALT: <6 U/L (ref 0–55)
ANION GAP: 10 meq/L (ref 3–11)
AST: 8 U/L (ref 5–34)
BUN: 7.1 mg/dL (ref 7.0–26.0)
CALCIUM: 9.5 mg/dL (ref 8.4–10.4)
CO2: 22 mEq/L (ref 22–29)
Chloride: 107 mEq/L (ref 98–109)
Creatinine: 0.8 mg/dL (ref 0.6–1.1)
Glucose: 121 mg/dl (ref 70–140)
POTASSIUM: 3 meq/L — AB (ref 3.5–5.1)
Sodium: 139 mEq/L (ref 136–145)
Total Bilirubin: 0.38 mg/dL (ref 0.20–1.20)
Total Protein: 6.6 g/dL (ref 6.4–8.3)

## 2016-03-07 LAB — CBC WITH DIFFERENTIAL/PLATELET
BASO%: 0.3 % (ref 0.0–2.0)
BASOS ABS: 0 10*3/uL (ref 0.0–0.1)
EOS%: 0.5 % (ref 0.0–7.0)
Eosinophils Absolute: 0.1 10*3/uL (ref 0.0–0.5)
HEMATOCRIT: 26.7 % — AB (ref 34.8–46.6)
HEMOGLOBIN: 8.8 g/dL — AB (ref 11.6–15.9)
LYMPH#: 1 10*3/uL (ref 0.9–3.3)
LYMPH%: 9.8 % — AB (ref 14.0–49.7)
MCH: 24.6 pg — AB (ref 25.1–34.0)
MCHC: 33 g/dL (ref 31.5–36.0)
MCV: 74.6 fL — ABNORMAL LOW (ref 79.5–101.0)
MONO#: 0.8 10*3/uL (ref 0.1–0.9)
MONO%: 7.7 % (ref 0.0–14.0)
NEUT#: 8.7 10*3/uL — ABNORMAL HIGH (ref 1.5–6.5)
NEUT%: 81.7 % — AB (ref 38.4–76.8)
Platelets: 499 10*3/uL — ABNORMAL HIGH (ref 145–400)
RBC: 3.58 10*6/uL — ABNORMAL LOW (ref 3.70–5.45)
RDW: 19.1 % — ABNORMAL HIGH (ref 11.2–14.5)
WBC: 10.6 10*3/uL — ABNORMAL HIGH (ref 3.9–10.3)
nRBC: 0 % (ref 0–0)

## 2016-03-07 MED ORDER — MORPHINE SULFATE 30 MG PO TABS: 30.0000 mg | ORAL_TABLET | Freq: Four times a day (QID) | ORAL | 0 refills | Status: DC | PRN

## 2016-03-07 MED ORDER — SODIUM CHLORIDE 0.9% FLUSH
10.0000 mL | INTRAVENOUS | Status: DC | PRN
  Administered 2016-03-07: 10 mL via INTRAVENOUS
  Filled 2016-03-07: qty 10

## 2016-03-07 MED ORDER — HEPARIN SOD (PORK) LOCK FLUSH 100 UNIT/ML IV SOLN
500.0000 [IU] | Freq: Once | INTRAVENOUS | Status: AC | PRN
  Administered 2016-03-07: 500 [IU] via INTRAVENOUS
  Filled 2016-03-07: qty 5

## 2016-03-07 NOTE — Progress Notes (Signed)
Tammi RN brought [employer] application for Eliquis for me to submit on patient's behalf.  Went to scheduling to ask patient to come to my office prior to leaving. Patient came in to discuss. I asked patient if her current Medicaid covers any part of the drug to find out if this was for co-pay assistance or not. Patient states Medicaid does not cover the drug and the application is for renewal of assistance. Asked patient where drug was shipped to and she said her home. Patient did not submit her proof of income only her spouse's. Advised patient per information on application, her income verification may be required as well. Advised patient it is easier to submit this information on the front end to make the process more smooth. Patient advised she may bring tomorrow. Once patient brings the information to me, I will fax to [employer] for processing.

## 2016-03-08 ENCOUNTER — Encounter: Payer: Self-pay | Admitting: Hematology and Oncology

## 2016-03-08 DIAGNOSIS — R634 Abnormal weight loss: Secondary | ICD-10-CM | POA: Insufficient documentation

## 2016-03-08 DIAGNOSIS — D630 Anemia in neoplastic disease: Secondary | ICD-10-CM | POA: Insufficient documentation

## 2016-03-08 LAB — CA 125: CANCER ANTIGEN (CA) 125: 304.9 U/mL — AB (ref 0.0–38.1)

## 2016-03-08 NOTE — Assessment & Plan Note (Signed)
The prior diagnosis of blood clot was over a year ago. We discussed extended duration of apixaban for secondary prevention. I spent some time completing paperwork to help getting assistant to pay for extended duration of reduced dose apixaban.

## 2016-03-08 NOTE — Assessment & Plan Note (Signed)
This is likely anemia of chronic disease. The patient denies recent history of bleeding such as epistaxis, hematuria or hematochezia. She is asymptomatic from the anemia. We will observe for now.  

## 2016-03-08 NOTE — Progress Notes (Signed)
[place] OFFICE PROGRESS NOTE  No care team member to display  SUMMARY OF ONCOLOGIC HISTORY:   Endometrial cancer ([place])   02/02/2008 Imaging    Uterine fibroid disease with dominant 5.4 cm fundal fibroid. 5 cm complex cystic abnormality of the left ovary/adnexa requiring follow-up ultrasound.  Cystic neoplasm cannot be excluded.  Follow-up is recommended in 6 to 8 weeks. 4 cm complex cyst of the right ovary.  This should also be followed with the left ovarian abnormality      03/03/2008 Imaging    Three uterine fibroids.  Largest measures up to 6.4 cm. Fluid within the endometrial canal with endometrial lining thickness 9.9 mm. Septated fluid containing structure adjacent to the right ovary may represent hydrosalpinx.  Ovarian mass cannot be excluded.  Given this finding, persistent vaginal bleeding and previously described adnexal abnormalities, MR of the pelvis with attention to the uterus ovary is recommended.      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 RIGHT OVARY, EXCISION: - HIGH GRADE SURFACE EPITHELIAL CARCINOMA, 2.7 CM, CONFINED WITHIN RIGHT OVARY. - NO ANGIOLYMPHATIC INVASION IDENTIFIED. - MATURE CYSTIC TERATOMA. - FALLOPIAN TUBE: NO PATHOLOGIC ABNORMALITIES. - PLEASE SEE ONCOLOGY TEMPLATE  2. 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 - ANGIOLYMPHATIC INVASION PRESENT. - LEIOMYOMATA. - CERVIX: BENIGN SQUAMOUS MUCOSA AND ENDOCERVICAL MUCOSA, NO DYSPLASIA OR MALIGNANCY IDENTIFIED.  LEFT OVARY: - HIGH  GRADE SURFACE EPITHELIAL CARCINOMA, 5.5 CM, CAPSULE RUPTURED; NO ANGIOLYMPHATIC INVASION SEEN. - FALLOPIAN TUBAL TISSUE WITH PARATUBAL CYST. - PLEASE SEE ONCOLOGY TEMPLATE.  3. OMENTUM, RESECTION: NEGATIVE FOR MALIGNANCY.      03/18/2008 Pathology Results    PERITONEAL WASHING (THIN PREPARATION AND CELL BLOCK): POSITIVE FOR ADENOCARCINOMA      09/21/2008 PET scan    FDG avid ascending colonic intraluminal mass, worrisome for colonic neoplasm.  The adjacent right pericolic gutter soft tissue nodule does not demonstrate F D G avidity, possibly due to its small size, but remains worrisome due to its proximity to the intraluminal mass and history of ovarian cancer. No abnormal F D G uptake in the neck, chest, or abdomen. Mild asymmetric soft tissue prominence of the anterior right parapharyngeal soft tissues, amenable to direct visualization.       12/23/2008 Miscellaneous    The patient completed external beam radiation therapy and high-dose rate brachytherapy as part of management of her stage IC endometrioid adenocarcinoma.  The patient completed her radiation treatments on 12/23/2008       02/12/2015 Relapse/Recurrence    Patient reported right inguinal mass and LLE pain when she presented to ED on 02-12-15, all due to disease relapse      02/12/2015 Imaging    Status post hysterectomy. 8.5 x 7.1 cm necrotic right inguinal node, corresponding to the palpable abnormality, suspicious for nodal metastasis. Additional retroperitoneal/ left pelvic nodal metastases, as above. 6.1 cm low-density lesion in the left psoas muscle, favored to reflect tumor, less likely infection. Secondary involvement of the left L4 and L5 vertebral bodies. Associated acute deep venous thrombosis involving the left external iliac vein and extending into  the left lower extremity, likely secondary to extrinsic compression by pelvic tumor. Pulmonary nodules/metastases at the lung bases, measuring up to 2.4 cm in the  left lower lobe.      02/14/2015 Procedure    Aspirate and core biopsy performed of enlarged and necrotic right inguinal lymph node. Aspirated fluid was sent for cytologic analysis. Solid tissue was sent for histopathologic analysis.       02/14/2015 Pathology Results    Diagnosis Lymph node, needle/core biopsy, right inguinal METASTATIC CARCINOMA WITH PREDOMINANT COAGULATIVE NECROSIS CONSISTENT WITH GYN PRIMARY Microscopic Comment The biopsy consist of metastatic carcinoma with coagulative necrosis. The neoplasm stains positive for ck7 and ER, supporting these are GYN primary.      02/14/2015 Pathology Results    Diagnosis LYMPH NODE, FINE NEEDLE ASPIRATION, RIGHT INGUINAL(SPECIMEN 1 OF 1 COLLECTED 02/14/15): MALIGNANT CELLS CONSISTENT WITH METASTATIC ADENOCARCINOMA.      02/14/2015 Tumor Marker    Patient's tumor was tested for the following markers: CA125 Results of the tumor marker test revealed 642.9      02/24/2015 - 05/26/2015 Chemotherapy    She received a few cycles of carboplatin and Taxotere      03/14/2015 Tumor Marker    Patient's tumor was tested for the following markers: CA125 Results of the tumor marker test revealed 427.8      04/12/2015 Procedure    Successful ultrasound-guided aspiration of a total of approximately 180 cc of brown colored fluid from symptomatic necrotic right inguinal lymph node. Note, if the cystic component of this lymph node reoccurs in the come symptomatic, the patient may benefit from a surgical resection      04/21/2015 Tumor Marker    Patient's tumor was tested for the following markers: CA125 Results of the tumor marker test revealed 111.9      05/12/2015 Pathology Results    Debridement, right inguinal - ADENOCARCINOMA ASSOCIATED WITH HEMATOMA. - SEE MICROSCOPIC DESCRIPTION. Microscopic Comment Within the dermis and subcutaneous tissue there is a hemorrhagic cavity surrounded by granulation tissue and abundant hemosiderin  deposition consistent with hematoma. In the adjacent connective tissue there are cystic glands with papillary protrusions consistent with adenocarcinoma. The morphologic features are most consistent with the previous ovarian carcinoma from 2010 ([id number]). (JDP:gt, 05/13/15)       05/12/2015 Pathology Results    CYTOLOGY FLUID: RIGHT INGUINAL FLUID(SPECIMEN 1 OF 1 COLLECTED 05/12/15):Marland Kitchen ABUNDANT BLOOD AND RARE ATYPICAL CELLS SUSPICIOUS FOR CARCINOMA      05/12/2015 Surgery    Procedure: Incision and drainage of right inguinal fluid collection with capsulectomy of seroma cavity Right inguinal wound VAC placement (10 x 9 x 6 cm) Operative Findings: Liquefied thick maroon colored fluid without odor. Well-defined capsule around the fluid collection. After resection of capsule there was adequate tissue overlying the vessels, therefore mobilization of sartorius was not necessary.      05/23/2015 Tumor Marker    Patient's tumor was tested for the following markers: CA125 Results of the tumor marker test revealed 48      06/14/2015 Imaging    Significant interval decrease in size of bilateral pulmonary nodules and masses as well as retroperitoneal, pelvic and inguinal adenopathy. Mass within the left upper lobe may represent metastatic disease or primary pulmonary malignancy. Interval increase in size of large cystic mass within the left hilum. Interval sclerosis of the L4 and L5 vertebral body metastasis.       06/14/2015 Genetic Testing    Genetics testing negative Breast Ovarian and gyn panel by [facility]  06-14-15.      07/07/2015 Tumor Marker    Patient's tumor was tested for the following markers: CA125 Results of the tumor marker test revealed 86.6      08/01/2015 PET scan    Interval progression, as described above. Dominant 7.8 cm left upper lobe mass abuts the mediastinum. Additional left upper lobe/ left lower lobe masses and thoracic lymphadenopathy. Additional retroperitoneal/left pelvic  nodal metastases. Postsurgical changes in the right inguinal region with mild residual hypermetabolism.      08/10/2015 Procedure    Successful placement of a right IJ approach Power Port with ultrasound and fluoroscopic guidance. The catheter is ready for use.      08/11/2015 Miscellaneous    She received 1 dose of gemzar      08/18/2015 Tumor Marker    Patient's tumor was tested for the following markers: CA125 Results of the tumor marker test revealed 171      08/19/2015 - 02/17/2016 Anti-estrogen oral therapy    She was placed on Letrozole, stopped due to progression of disease      08/24/2015 - 09/15/2015 Radiation Therapy    08/24/2015-09/15/2015: Left Lung, 37.5 Gy in 15 fractions by Dr. Sondra Come.       09/23/2015 Imaging    Decrease in some of the pulmonary metastatic lesions as described above. The mass lesion abutting the mediastinum on the left has decreased in size from the prior exam. New mild pericardial effusion. Increase in size in a pelvic lymphadenopathy in the iliac chain. The left psoas mass and periaortic lymph node as well as changes in the right inguinal region are relatively stable as described. No new focal abnormality is seen.      09/28/2015 Imaging    Cerebellar edema centrally on the left with suggestion of an underlying 3.5 cm left cerebellar mass. Mass effect on the fourth ventricle but no ventriculomegaly or loss of basilar cistern patency. 2. Cerebellar metastatic disease favored in this clinical setting. Follow-up Brain MRI without and with contrast would characterize further. 3. No other acute or metastatic intracranial abnormality identified.      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 Vasogenic edema with mild mass effect on the fourth ventricle, no obstructive hydrocephalus. Otherwise negative MRI of the head for age.       10/07/2015 Imaging    Known left cerebellar metastasis with vasogenic edema. Subjectively less  mass effect on the adjacent fourth ventricle, albeit minimal.      10/11/2015 - 10/11/2015 Radiation Therapy    10/07/15: PTV1 Lt Cerebellar 23 mm was treated to 18 Gy in one fraction by Dr. Tammi Klippel.      10/31/2015 Tumor Marker    Patient's tumor was tested for the following markers: CA125 Results of the tumor marker test revealed 82.3      12/12/2015 Tumor Marker    Patient's tumor was tested for the following markers: CA125 Results of the tumor marker test revealed 92.5      01/03/2016 Imaging    Exam detail is diminished secondary to motion artifact. This predominantly affects the segmental pulmonary arteries of the left lung. Within this limitation no evidence for acute pulmonary embolus noted. 2. Increase in volume of left pericardial. 3. Mixed response to therapy with respect to mediastinal adenopathy and pulmonary lesions. The dominant left upper lobe lung mass is decreased in size in the interval. There is a nodule within the right lower lobe which is increased in  size in the interval. 4. Pre-vascular lymph node is mildly decreased in size in the interval. Large necrotic cystic nodal mass within the AP window region is stable in the interval.      01/05/2016 Imaging    Interval increase in size of left cerebellar metastasis, now measuring 2.4 x 2.6 x 2.0 cm. Similar localized edema and mass effect on the adjacent fourth ventricle. Fourth ventricle remains patent without obstructive hydrocephalus. 2. Interval development of a second 1.3 x 1.3 x 1.1 cm metastatic lesion within the left periventricular white matter of the left frontal lobe. Mild localized edema without significant mass effect.       01/09/2016 Imaging    LV EF: 60% -   65%      01/26/2016 Tumor Marker    Patient's tumor was tested for the following markers: CA125 Results of the tumor marker test revealed 211.9      02/01/2016 - 02/01/2016 Radiation Therapy    Left frontal 13 mm target was treated using 3 Dynamic  Conformal Arcs to a prescription dose of 20 Gy.      03/07/2016 Tumor Marker    Patient's tumor was tested for the following markers: CA125 Results of the tumor marker test revealed 304       INTERVAL HISTORY: Please see below for problem oriented charting. She returns with multiple family members. She had lost a lot of weight since her last time I saw her. According to the patient, she have severe left groin/pelvic pain that wakes her up from sleep. She rated her pain at 10 out of 10. When she takes morphine sulfate, it would reduce her pain level from 10 down to 6.  6 out of 10 pain is considered bearable to the patient. She had recent constipation which she blamed on the pain medicine and because of that, she has poor appetite and some nausea. There were no reported headaches, new neurological deficits of seizures The patient denies any recent signs or symptoms of bleeding such as spontaneous epistaxis, hematuria or hematochezia.  REVIEW OF SYSTEMS:   Constitutional: Denies fevers, chills  Eyes: Denies blurriness of vision Ears, nose, mouth, throat, and face: Denies mucositis or sore throat Respiratory: Denies cough, dyspnea or wheezes Cardiovascular: Denies palpitation, chest discomfort or lower extremity swelling Skin: Denies abnormal skin rashes Lymphatics: Denies new lymphadenopathy or easy bruising Neurological:Denies numbness, tingling or new weaknesses Behavioral/Psych: Mood is stable, no new changes  All other systems were reviewed with the patient and are negative.  I have reviewed the past medical history, past surgical history, social history and family history with the patient and they are unchanged from previous note.  ALLERGIES:  is allergic to morphine and related and dilaudid [hydromorphone hcl].  MEDICATIONS:  Current Outpatient Prescriptions  Medication Sig Dispense Refill  . amLODipine (NORVASC) 5 MG tablet TAKE 1 TABLET BY MOUTH TWICE A DAY 60 tablet 6  .  apixaban (ELIQUIS) 5 MG TABS tablet Take 1 tablet (5 mg total) by mouth 2 (two) times daily. 180 tablet 1  . benzonatate (TESSALON) 100 MG capsule TK ONE C PO  BID PRN  0  . lidocaine-prilocaine (EMLA) cream Apply to Porta-cath 1-2 hrs prior to access as directed. 30 g 1  . megestrol (MEGACE) 40 MG tablet Take 1 tablet (40 mg total) by mouth 2 (two) times daily. 60 tablet 1  . morphine (MSIR) 30 MG tablet Take 1 tablet (30 mg total) by mouth every 6 (six) hours as needed for  moderate pain or severe pain. 60 tablet 0  . pantoprazole (PROTONIX) 40 MG tablet Take 1 tablet (40 mg total) by mouth daily. 30 tablet 11  . Pediatric Multivit-Minerals-C (FLINTSTONES GUMMIES PO) Take 1 tablet by mouth daily.    . polyethylene glycol (MIRALAX / GLYCOLAX) packet Take 17 g by mouth daily as needed for moderate constipation.    Marland Kitchen PROAIR HFA 108 (90 Base) MCG/ACT inhaler   3  . senna (SENOKOT) 8.6 MG tablet Take 1 tablet by mouth daily.    . VOLTAREN 1 % GEL APPLY TO AFFECTED AREA EVERY 8 TO 12 HOURS AS NEEDED FOR PAIN  4  . zolpidem (AMBIEN) 5 MG tablet Take 1 tablet (5 mg total) by mouth at bedtime as needed for sleep. 30 tablet 1  . levETIRAcetam (KEPPRA) 500 MG tablet Take 1 tablet (500 mg total) by mouth 2 (two) times daily. 60 tablet 1  . LORazepam (ATIVAN) 0.5 MG tablet Take 1 tablet (0.5 mg total) by mouth every 6 (six) hours as needed (nausea). (Patient not taking: Reported on 03/05/2016) 30 tablet 0  . magnesium hydroxide (MILK OF MAGNESIA) 400 MG/5ML suspension Take 5 mLs by mouth daily as needed for mild constipation.    . potassium chloride (K-DUR) 10 MEQ tablet Take 2 tablets twice a day or as directed. (Patient not taking: Reported on 02/17/2016) 120 tablet 0  . prochlorperazine (COMPAZINE) 10 MG tablet Take 1 tablet (10 mg total) by mouth every 6 (six) hours as needed for nausea or vomiting. (Patient not taking: Reported on 02/17/2016) 20 tablet 0   No current facility-administered medications for this  visit.     PHYSICAL EXAMINATION: ECOG PERFORMANCE STATUS: 2 - Symptomatic, <50% confined to bed  Vitals:   03/07/16 1122  BP: 139/76  Pulse: (!) 118  Resp: 19  Temp: 99.3 F (37.4 C)   Filed Weights   03/07/16 1122  Weight: 150 lb 9.6 oz (68.3 kg)    GENERAL:alert, no distress and comfortable SKIN: skin color, texture, turgor are normal, no rashes or significant lesions EYES: normal, Conjunctiva are pink and non-injected, sclera clear Musculoskeletal:no cyanosis of digits and no clubbing  NEURO: alert & oriented x 3 with fluent speech, no focal motor/sensory deficits  LABORATORY DATA:  I have reviewed the data as listed    Component Value Date/Time   NA 139 03/07/2016 1020   K 3.0 (LL) 03/07/2016 1020   CL 105 01/03/2016 1411   CO2 22 03/07/2016 1020   GLUCOSE 121 03/07/2016 1020   BUN 7.1 03/07/2016 1020   CREATININE 0.8 03/07/2016 1020   CALCIUM 9.5 03/07/2016 1020   PROT 6.6 03/07/2016 1020   ALBUMIN 2.8 (L) 03/07/2016 1020   AST 8 03/07/2016 1020   ALT <6 03/07/2016 1020   ALKPHOS 86 03/07/2016 1020   BILITOT 0.38 03/07/2016 1020   GFRNONAA >60 01/03/2016 1411   GFRAA >60 01/03/2016 1411    No results found for: SPEP, UPEP  Lab Results  Component Value Date   WBC 10.6 (H) 03/07/2016   NEUTROABS 8.7 (H) 03/07/2016   HGB 8.8 (L) 03/07/2016   HCT 26.7 (L) 03/07/2016   MCV 74.6 (L) 03/07/2016   PLT 499 (H) 03/07/2016      Chemistry      Component Value Date/Time   NA 139 03/07/2016 1020   K 3.0 (LL) 03/07/2016 1020   CL 105 01/03/2016 1411   CO2 22 03/07/2016 1020   BUN 7.1 03/07/2016 1020   CREATININE  0.8 03/07/2016 1020      Component Value Date/Time   CALCIUM 9.5 03/07/2016 1020   ALKPHOS 86 03/07/2016 1020   AST 8 03/07/2016 1020   ALT <6 03/07/2016 1020   BILITOT 0.38 03/07/2016 1020       ASSESSMENT & PLAN:  Endometrial cancer ([place]) She has significant, extensive history. She has extensive metastatic disease. The patient had  refused to discuss palliative care/hospice in the past. I recommend discontinuation of letrozole due to recent progressive disease. She currently have poor appetite. I recommend a trial of Megace that might help with her appetite, along with possible positive effect on her tumor. I am aware that that might increase the risk of blood clot but currently she is on chronic anticoagulation therapy. She is willing to try Even though tumor marker is slightly elevated, it is too soon to tell.  I recommend we try for at least 3 months before stopping  Cancer associated pain She has severe, uncontrolled pelvic pain. She is scared to take pain medicine for fear of severe constipation. I spent a lot of time discussing the importance of getting her pain under good control as poorly controlled pain can cause nausea, constipation and poor appetite. Recommend patient and family members to start pain medicine diary. I recommend documentation of pain level intensity before each prescription of pain medicine. I gave him permission to take morphine sulfate as frequent as every 2-3 hours as needed. I gave her another prescription today. I will see her back in about 2 weeks for further assessment of pain control  Constipation due to pain medication The patient have severe constipation. Previously, I did spend some time reviewing with her and family member related to laxative therapy. Recommend daily Senokot, MiraLAX and she felt that castor oil helps as well  Leg DVT (deep venous thromboembolism), chronic, left ([place]) The prior diagnosis of blood clot was over a year ago. We discussed extended duration of apixaban for secondary prevention. I spent some time completing paperwork to help getting assistant to pay for extended duration of reduced dose apixaban.  Weight loss She has severe weight loss. The reasons for weight loss are due to uncontrolled pain and constipation. As above, we are attempting to get  her pain and constipation better control. We discussed high calorie food intake. Hopefully, Megace will also help with appetite.   Anemia in neoplastic disease This is likely anemia of chronic disease. The patient denies recent history of bleeding such as epistaxis, hematuria or hematochezia. She is asymptomatic from the anemia. We will observe for now.   Hypokalemia due to inadequate potassium intake This is likely due to poor oral intake.  We discussed potassium rich diet   No orders of the defined types were placed in this encounter.  All questions were answered. The patient knows to call the clinic with any problems, questions or concerns. No barriers to learning was detected. I spent 30 minutes counseling the patient face to face. The total time spent in the appointment was 40 minutes and more than 50% was on counseling and review of test results     Heath Lark, MD 03/08/2016 4:14 PM

## 2016-03-08 NOTE — Assessment & Plan Note (Signed)
The patient have severe constipation. Previously, I did spend some time reviewing with her and family member related to laxative therapy. Recommend daily Senokot, MiraLAX and she felt that castor oil helps as well

## 2016-03-08 NOTE — Assessment & Plan Note (Signed)
She has severe, uncontrolled pelvic pain. She is scared to take pain medicine for fear of severe constipation. I spent a lot of time discussing the importance of getting her pain under good control as poorly controlled pain can cause nausea, constipation and poor appetite. Recommend patient and family members to start pain medicine diary. I recommend documentation of pain level intensity before each prescription of pain medicine. I gave him permission to take morphine sulfate as frequent as every 2-3 hours as needed. I gave her another prescription today. I will see her back in about 2 weeks for further assessment of pain control

## 2016-03-08 NOTE — Assessment & Plan Note (Signed)
This is likely due to poor oral intake.  We discussed potassium rich diet

## 2016-03-08 NOTE — Assessment & Plan Note (Signed)
She has severe weight loss. The reasons for weight loss are due to uncontrolled pain and constipation. As above, we are attempting to get her pain and constipation better control. We discussed high calorie food intake. Hopefully, Megace will also help with appetite.

## 2016-03-08 NOTE — Assessment & Plan Note (Signed)
She has significant, extensive history. She has extensive metastatic disease. The patient had refused to discuss palliative care/hospice in the past. I recommend discontinuation of letrozole due to recent progressive disease. She currently have poor appetite. I recommend a trial of Megace that might help with her appetite, along with possible positive effect on her tumor. I am aware that that might increase the risk of blood clot but currently she is on chronic anticoagulation therapy. She is willing to try Even though tumor marker is slightly elevated, it is too soon to tell.  I recommend we try for at least 3 months before stopping

## 2016-03-09 ENCOUNTER — Encounter: Payer: Self-pay | Admitting: Hematology and Oncology

## 2016-03-09 NOTE — Progress Notes (Signed)
Faxed [employer] application. Fax received ok per confirmation sheet.

## 2016-03-13 ENCOUNTER — Encounter: Payer: Self-pay | Admitting: Hematology and Oncology

## 2016-03-13 ENCOUNTER — Telehealth: Payer: Self-pay

## 2016-03-13 ENCOUNTER — Ambulatory Visit: Payer: Medicaid Other | Admitting: Hematology and Oncology

## 2016-03-13 ENCOUNTER — Other Ambulatory Visit: Payer: Medicaid Other

## 2016-03-13 NOTE — Progress Notes (Signed)
Received fax from [employer] additional information needed to process application.Called [employer] to verify what was needed. Faxed copy of Medicaid card. Fax received ok per confirmation sheet.   Called patient and left voicemail requesting social security verification information. Left my contact name and number.

## 2016-03-13 NOTE — Telephone Encounter (Signed)
Given below message and verbalized understanding. Patient with daughter and will go to ER to be evaluated.

## 2016-03-13 NOTE — Telephone Encounter (Signed)
The only medication adjustment made was starting her on pain medications for severe cancer pain She also has brain mets; her symptoms could also be related to progression of brain mets I recommend she goes to the ER for urgent evaluation. She needs urgent imaging study of her head

## 2016-03-13 NOTE — Telephone Encounter (Signed)
Patient left message for nurse to call her back. Returned phone call patient stated that she has been dizzy and walking into walls since she saw Dr. Alvy Bimler last week. She thinks it is the medication adjustment that was made at the last visit.

## 2016-03-13 NOTE — Telephone Encounter (Signed)
Clicked on in error.

## 2016-03-14 ENCOUNTER — Emergency Department (HOSPITAL_COMMUNITY): Payer: Medicaid Other

## 2016-03-14 ENCOUNTER — Encounter (HOSPITAL_COMMUNITY): Payer: Self-pay | Admitting: Emergency Medicine

## 2016-03-14 ENCOUNTER — Telehealth: Payer: Self-pay | Admitting: Radiation Therapy

## 2016-03-14 ENCOUNTER — Emergency Department (HOSPITAL_COMMUNITY)
Admission: EM | Admit: 2016-03-14 | Discharge: 2016-03-14 | Disposition: A | Discharge status: Home / Self Care | Payer: Medicaid Other | Attending: Emergency Medicine | Admitting: Emergency Medicine

## 2016-03-14 DIAGNOSIS — R42 Dizziness and giddiness: Secondary | ICD-10-CM | POA: Diagnosis present

## 2016-03-14 DIAGNOSIS — E860 Dehydration: Secondary | ICD-10-CM | POA: Diagnosis not present

## 2016-03-14 DIAGNOSIS — Z79899 Other long term (current) drug therapy: Secondary | ICD-10-CM | POA: Insufficient documentation

## 2016-03-14 DIAGNOSIS — C541 Malignant neoplasm of endometrium: Secondary | ICD-10-CM | POA: Insufficient documentation

## 2016-03-14 DIAGNOSIS — I10 Essential (primary) hypertension: Secondary | ICD-10-CM | POA: Insufficient documentation

## 2016-03-14 DIAGNOSIS — C7931 Secondary malignant neoplasm of brain: Secondary | ICD-10-CM | POA: Diagnosis not present

## 2016-03-14 DIAGNOSIS — E876 Hypokalemia: Secondary | ICD-10-CM

## 2016-03-14 DIAGNOSIS — Z7901 Long term (current) use of anticoagulants: Secondary | ICD-10-CM | POA: Insufficient documentation

## 2016-03-14 LAB — URINALYSIS, ROUTINE W REFLEX MICROSCOPIC
BILIRUBIN URINE: NEGATIVE
Glucose, UA: NEGATIVE mg/dL
HGB URINE DIPSTICK: NEGATIVE
KETONES UR: NEGATIVE mg/dL
NITRITE: NEGATIVE
PH: 6 (ref 5.0–8.0)
Protein, ur: NEGATIVE mg/dL
Specific Gravity, Urine: 1.009 (ref 1.005–1.030)

## 2016-03-14 LAB — COMPREHENSIVE METABOLIC PANEL
ALT: 8 U/L — ABNORMAL LOW (ref 14–54)
AST: 13 U/L — ABNORMAL LOW (ref 15–41)
Albumin: 3 g/dL — ABNORMAL LOW (ref 3.5–5.0)
Alkaline Phosphatase: 62 U/L (ref 38–126)
Anion gap: 10 (ref 5–15)
BILIRUBIN TOTAL: 0.6 mg/dL (ref 0.3–1.2)
BUN: 7 mg/dL (ref 6–20)
CALCIUM: 9.2 mg/dL (ref 8.9–10.3)
CHLORIDE: 103 mmol/L (ref 101–111)
CO2: 21 mmol/L — ABNORMAL LOW (ref 22–32)
Creatinine, Ser: 0.81 mg/dL (ref 0.44–1.00)
Glucose, Bld: 102 mg/dL — ABNORMAL HIGH (ref 65–99)
Potassium: 2.9 mmol/L — ABNORMAL LOW (ref 3.5–5.1)
Sodium: 134 mmol/L — ABNORMAL LOW (ref 135–145)
TOTAL PROTEIN: 6.9 g/dL (ref 6.5–8.1)

## 2016-03-14 LAB — CBC
HCT: 27 % — ABNORMAL LOW (ref 36.0–46.0)
Hemoglobin: 9.1 g/dL — ABNORMAL LOW (ref 12.0–15.0)
MCH: 24.8 pg — ABNORMAL LOW (ref 26.0–34.0)
MCHC: 33.7 g/dL (ref 30.0–36.0)
MCV: 73.6 fL — ABNORMAL LOW (ref 78.0–100.0)
Platelets: 508 10*3/uL — ABNORMAL HIGH (ref 150–400)
RBC: 3.67 MIL/uL — ABNORMAL LOW (ref 3.87–5.11)
RDW: 19.4 % — AB (ref 11.5–15.5)
WBC: 10.6 10*3/uL — ABNORMAL HIGH (ref 4.0–10.5)

## 2016-03-14 LAB — CBG MONITORING, ED: Glucose-Capillary: 103 mg/dL — ABNORMAL HIGH (ref 65–99)

## 2016-03-14 LAB — TROPONIN I

## 2016-03-14 LAB — LIPASE, BLOOD: LIPASE: 12 U/L (ref 11–51)

## 2016-03-14 MED ORDER — SODIUM CHLORIDE 0.9 % IV BOLUS (SEPSIS)
1000.0000 mL | Freq: Once | INTRAVENOUS | Status: AC
  Administered 2016-03-14: 1000 mL via INTRAVENOUS

## 2016-03-14 MED ORDER — KCL IN DEXTROSE-NACL 40-5-0.9 MEQ/L-%-% IV SOLN
INTRAVENOUS | Status: DC
  Administered 2016-03-14: 13:00:00 via INTRAVENOUS
  Filled 2016-03-14 (×2): qty 1000

## 2016-03-14 MED ORDER — POTASSIUM CHLORIDE CRYS ER 20 MEQ PO TBCR: 20.0000 meq | EXTENDED_RELEASE_TABLET | Freq: Two times a day (BID) | ORAL | 0 refills | Status: DC

## 2016-03-14 MED ORDER — PREDNISONE 10 MG (21) PO TBPK: ORAL_TABLET | Freq: Every day | ORAL | 0 refills | Status: DC

## 2016-03-14 NOTE — ED Notes (Signed)
Bed: KC46 Expected date:  Expected time:  Means of arrival:  Comments: Pt w/ brain ca ams

## 2016-03-14 NOTE — Telephone Encounter (Addendum)
Returned a call to Ms. Boucher to inform her that Dr. Tammi Klippel agrees with Dr. Calton Dach recommendation from yesterday, that she should go to the emergency room to be evaluated. I also spoke with her daughter because she is concerned about her mother sitting out in the waiting room for hours waiting to go back to be seen. I agreed to call the ED to inform them that she would be arriving around 12:00 and ask if they will take her on back. I did let her know that the patients are triaged based on urgency, but I would do my best to try and decrease their wait.   The patient and her daughter are happy with this plan, but wish that there was a better way to get her in to be seen.    I will add this to the conference list for Monday 3/19 as well.   Mont Dutton R.T.(R)(T) Special Procedures Navigator     Spoke with Catalina Antigua the charge leader at [facility] ED. He is going to share this with the triage team and try to save a room for her arrival.

## 2016-03-14 NOTE — ED Notes (Signed)
Patient transported to CT 

## 2016-03-14 NOTE — ED Triage Notes (Signed)
Patient reports dizziness, nausea, vomiting, unsteady gait starting 1 week ago. 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 Patient taking PO chemotherapy (last chemo pill was today) and had 2 radiation treatments for her brain cancer (last radiation was over 1 month ago).

## 2016-03-14 NOTE — ED Notes (Signed)
Patient aware of urine sample. Patient states she cannot void at this time but will attempt after she receives some of the fluids.

## 2016-03-14 NOTE — ED Notes (Signed)
Bedside reportinig done with Corey Skains, RN.

## 2016-03-14 NOTE — ED Provider Notes (Signed)
Pt case transferred to me from Domenic Moras, PA-C.   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 Under Chemo and radiation. Last chemo pill was today, last radiation was >1 month ago. Has had  N/V, dizziness x 1 week  No pain at this time.  Potassium 2.9. Other bloodwork similar to previous findings and with consideration of her Chemo and radition. Evidence of possible dehydration.   Given 1L of fluids and potassium.  Pt will be given 2nd dose of 1 L of fluids.  And will reassess and evaluate orthostatic Bp.   CT of head due to recommendation from her Heme/Onc to evaluate her known brain metastasis.   I spoke with oncologist on call, Dr. Irene Limbo, who recommended to have patient admitted and have IV steroids started.   Patient requested to be sent home and declined admission.   Patient will be sent home with instructions to continue hydration at home, given Potassium 67meq x 3 days, and a tapered steroid pack. Close follow up with PCP and oncologist. She is given strict instructions to call her oncologist tomorrow regarding her visit today and findings on Ct.  Patient appears to be reliable for follow-up. Upon reevaluation pt stated she feelt better. I feel patient is safe for discharge at this time. Reasons to immediately return to the emergency department discussed. She is in no apparent distress, afebrile prior to discharge.  Patient case discussed with Dr. Laneta Simmers who agreed with assessment and plan.     [place], [place] 03/15/16 [place], [place] 03/15/16 5400    Leo Grosser, MD 03/16/16 [phone]

## 2016-03-14 NOTE — ED Provider Notes (Signed)
[facility] DEPT Provider Note   CSN: [id number] Arrival date & time: 03/14/16  1203     History   Chief Complaint Chief Complaint  Patient presents with  . Dizziness  . Emesis  . Cancer Pt    HPI [name] is a 61 y.o. female.  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 Alvy Bimler brought in by family member for evaluation of recurrent nausea and vomiting. History obtained through patient and through daughter who is at bedside. For the past 2 weeks patient has endorsed intermittent nausea, occasional vomiting, and decreased appetite. Her symptoms started shortly after her morphine dose was increased. She does not feel nauseous all the time, and vomited once or twice a day every other day. She does endorse lightheadedness with positional change from laying to sitting or from sitting to standing. She has some near falls where she falls toward the wall but never down to the ground. She does report decrease in appetite, drinking a small mount of water in the day. She denies pain after eating. No report of fever, headache, neck pain, URI symptoms, chest pain, shortness of breath, abdominal pain, productive cough, diarrhea, dysuria or rash. Her last bowel movement was this AM and was normal.     Past Medical History:  Diagnosis Date  . Chemotherapy-induced neuropathy ([place])   . DVT, lower extremity ([place])    left  02-12-2015  currently on Eliqius  . Endometrial cancer [facility]) oncologist-  dr Marko Plume   02-2015  . 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 Family history of breast cancer   . History of adenomatous polyp of colon    tubulovillious adenoma 09/ 2010  . History of ovarian cyst    complex  . History of radiation therapy 10/21/08-11/23/08 & 12/07/10/13/12/23 2010   ENDOMETRIOID   . History of uterine fibroid   . Hypertension   . Inguinal fluid  collection 02/2015   right-drained x2  . Iron deficiency anemia    chronic severe  . Metastasis to lung ([place])   . 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 Weight loss 03/08/2016  . Anemia in neoplastic disease 03/08/2016  . Goals of care, counseling/discussion 02/19/2016  . Cough 01/23/2016  . Hypercalcemia 01/23/2016  . Port catheter in place 11/01/2015  . Hyponatremia 10/09/2015  . Bradycardia   . Elevated blood pressure reading 10/07/2015  . Abdominal pain 10/07/2015  . Dehydration 10/07/2015  . Hypertensive urgency 10/07/2015  . Metastasis to brain ([place]) 09/29/2015  . Endometrial cancer, FIGO stage IVB ([place])   . Pericardial effusion 09/23/2015  . Malignant neoplasm metastatic to lung ([place]) 08/20/2015  . 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 Genetic testing 07/07/2015  . Adenomatous colon polyp 05/10/2015  . Family history of breast cancer   . Chemotherapy induced nausea and vomiting 03/05/2015  . Leg DVT (deep venous thromboembolism), chronic, left ([place]) 03/05/2015  . Poor venous access 02/22/2015  . Constipation due to pain medication 02/22/2015  . Noncompliance with medication regimen 02/22/2015  . Chronic anticoagulation 02/22/2015  . Chemotherapy-induced neuropathy ([place]) 02/22/2015  . Cancer associated pain 02/22/2015  . Iron deficiency anemia due to chronic blood loss 02/22/2015  . International Federation of Gynecology and Obstetrics (  FIGO) stage IVB epithelial ovarian cancer ([place]) 02/22/2015  . Uterine cancer ([place]) 02/12/2015  . Hypokalemia due to inadequate potassium intake 02/12/2015  . Endometrial cancer ([place]) 11/20/2010  . Essential hypertension 09/23/2008  . NONSPECIFIC ABN FINDING RAD & OTH EXAM GI TRACT 09/23/2008    Past Surgical History:  Procedure Laterality Date  . ABDOMINAL HYSTERECTOMY  2010  .  APPLICATION OF WOUND VAC Right 05/12/2015   Procedure: APPLICATION OF WOUND VAC;  Surgeon: Hall Busing, MD;  Location: [facility];  Service: General;  Laterality: Right;  . COLONOSCOPY W/ POLYPECTOMY  09-27-2008  . EXPLORATORY LAPAROTOMY /  TOTAL ABDOMINAL HYSTERECTOMY/ BILATERAL SALPINOOPHORECTOMY/  PARTIAL OMENTECTOMY  03-18-2008  . INGUINAL HERNIA REPAIR Right 05/12/2015   Procedure: INCISION AND DRAINGE RIGHT INGUINAL FLUID COLLECTION ;  Surgeon: Hall Busing, MD;  Location: [facility];  Service: General;  Laterality: Right;  . IR GENERIC HISTORICAL  08/10/2015   IR US GUIDE VASC ACCESS RIGHT 08/10/2015 Jacqulynn Cadet, MD [facility]-INTERV RAD  . IR GENERIC HISTORICAL  08/10/2015   IR FLUORO GUIDE CV LINE RIGHT 08/10/2015 Jacqulynn Cadet, MD [facility]-INTERV RAD  . porta cath  06/2010   removal  . TUBAL LIGATION  1990's  . UMBILICAL HERNIA REPAIR     infant    OB History    No data available       Home Medications    Prior to Admission medications   Medication Sig Start Date End Date Taking? Authorizing Provider  amLODipine (NORVASC) 5 MG tablet TAKE 1 TABLET BY MOUTH TWICE A DAY 03/06/16  Yes Brittainy Erie Noe, PA-C  apixaban (ELIQUIS) 5 MG TABS tablet Take 1 tablet (5 mg total) by mouth 2 (two) times daily. 02/27/16  Yes Heath Lark, MD  benzonatate (TESSALON) 100 MG capsule TK ONE C PO  BID PRN for cough 02/13/16  Yes Historical Provider, MD  levETIRAcetam (KEPPRA) 500 MG tablet Take 1 tablet (500 mg total) by mouth 2 (two) times daily. 01/30/16  Yes Lennis Marion Downer, MD  lidocaine-prilocaine (EMLA) cream Apply to Porta-cath 1-2 hrs prior to access as directed. 08/04/15  Yes Lennis Marion Downer, MD  LORazepam (ATIVAN) 0.5 MG tablet Take 1 tablet (0.5 mg total) by mouth every 6 (six) hours as needed (nausea). Patient taking differently: Take 0.5 mg by mouth every 6 (six) hours as needed (for nausea).  01/16/16  Yes Hayden Pedro, PA-C  megestrol (MEGACE) 40 MG  tablet Take 1 tablet (40 mg total) by mouth 2 (two) times daily. 02/17/16  Yes Heath Lark, MD  morphine (MSIR) 30 MG tablet Take 1 tablet (30 mg total) by mouth every 6 (six) hours as needed for moderate pain or severe pain. 03/07/16  Yes Heath Lark, MD  Multiple Vitamins-Minerals (ADULT GUMMY) CHEW Chew 2 each by mouth daily.   Yes Historical Provider, MD  pantoprazole (PROTONIX) 40 MG tablet Take 1 tablet (40 mg total) by mouth daily. 03/01/16  Yes Heath Lark, MD  polyethylene glycol (MIRALAX / GLYCOLAX) packet Take 17 g by mouth daily.    Yes Historical Provider, MD  PROAIR HFA 108 [phone] Base) MCG/ACT inhaler Inhale 1-2 puffs into the lungs every 4 (four) hours as needed for wheezing or shortness of breath.  03/05/16  Yes Historical Provider, MD  senna (SENOKOT) 8.6 MG tablet Take 1 tablet by mouth daily.   Yes Historical Provider, MD  VOLTAREN 1 % GEL APPLY TO AFFECTED AREA EVERY 8 TO 12 HOURS AS NEEDED  FOR PAIN 01/26/16  Yes Historical Provider, MD  zolpidem (AMBIEN) 5 MG tablet Take 1 tablet (5 mg total) by mouth at bedtime as needed for sleep. 12/13/15  Yes Lennis Marion Downer, MD  potassium chloride (K-DUR) 10 MEQ tablet Take 2 tablets twice a day or as directed. Patient not taking: Reported on 02/17/2016 12/12/15   Gordy Levan, MD  prochlorperazine (COMPAZINE) 10 MG tablet Take 1 tablet (10 mg total) by mouth every 6 (six) hours as needed for nausea or vomiting. Patient not taking: Reported on 02/17/2016 01/26/16   Gordy Levan, MD    Family History Family History  Problem Relation Age of Onset  . Bone cancer Paternal Grandmother     OSSEOUS METASTASIS  . Heart disease Father   . Heart disease Sister   . Bone cancer Maternal Grandmother 75  . Leukemia Maternal Grandfather 36  . Liver cancer Maternal Uncle   . Breast cancer Cousin 14    maternal first cousin    Social History Social History  Substance Use Topics  . Smoking status: Never Smoker  . Smokeless tobacco: Never Used  .  Alcohol use No     Allergies   Morphine and related and Dilaudid [hydromorphone hcl]   Review of Systems Review of Systems  All other systems reviewed and are negative.    Physical Exam Updated Vital Signs BP 157/90 (BP Location: Left Arm)   Pulse 114   Temp 98.3 F (36.8 C) (Oral)   Resp 18   Ht 5\' 2"  (1.575 m)   Wt 68 kg   SpO2 98%   BMI 27.44 kg/m   Physical Exam  Constitutional: She is oriented to person, place, and time. She appears well-developed and well-nourished. No distress.  HENT:  Head: Atraumatic.  Right Ear: External ear normal.  Left Ear: External ear normal.  Mouth/Throat: Oropharynx is clear and moist.  Eyes: Conjunctivae are normal.  Neck: Normal range of motion. Neck supple.  No nuchal rigidity  Cardiovascular: Intact distal pulses.   Mild tachycardia without murmurs or gallops  Pulmonary/Chest: Effort normal and breath sounds normal. No respiratory distress. She has no wheezes.  Abdominal: Soft. Bowel sounds are normal. She exhibits no distension. There is no tenderness.  Musculoskeletal:  5 out 5 strength to all 4 extremities  Neurological: She is alert and oriented to person, place, and time. She has normal strength. No cranial nerve deficit or sensory deficit. GCS eye subscore is 4. GCS verbal subscore is 5. GCS motor subscore is 6.  Skin: No rash noted.  Psychiatric: She has a normal mood and affect.  Nursing note and vitals reviewed.    ED Treatments / Results  Labs (all labs ordered are listed, but only abnormal results are displayed) Labs Reviewed  CBC - Abnormal; Notable for the following:       Result Value   WBC 10.6 (*)    RBC 3.67 (*)    Hemoglobin 9.1 (*)    HCT 27.0 (*)    MCV 73.6 (*)    MCH 24.8 (*)    RDW 19.4 (*)    Platelets 508 (*)    All other components within normal limits  URINALYSIS, ROUTINE W REFLEX MICROSCOPIC - Abnormal; Notable for the following:    Leukocytes, UA TRACE (*)    Bacteria, UA RARE (*)     Squamous Epithelial / LPF 0-5 (*)    All other components within normal limits  COMPREHENSIVE METABOLIC PANEL - Abnormal; Notable  for the following:    Sodium 134 (*)    Potassium 2.9 (*)    CO2 21 (*)    Glucose, Bld 102 (*)    Albumin 3.0 (*)    AST 13 (*)    ALT 8 (*)    All other components within normal limits  CBG MONITORING, ED - Abnormal; Notable for the following:    Glucose-Capillary 103 (*)    All other components within normal limits  LIPASE, BLOOD  TROPONIN I    EKG  EKG Interpretation  Date/Time:  Wednesday March 14 2016 12:19:44 EDT Ventricular Rate:  112 PR Interval:    QRS Duration: 134 QT Interval:  351 QTC Calculation: 480 R Axis:   -75 Text Interpretation:  Sinus tachycardia RBBB and LAFB ST elevation, consider inferior injury no change from previous Confirmed by Johnney Killian, MD, Jeannie Done [phone]) on 03/14/2016 3:01:58 PM       Radiology No results found.  Procedures Procedures (including critical care time)  Medications Ordered in ED Medications  dextrose 5 % and 0.9 % NaCl with KCl 40 mEq/L infusion ( Intravenous New Bag/Given 03/14/16 1321)  sodium chloride 0.9 % bolus 1,000 mL (0 mLs Intravenous Stopped 03/14/16 1519)  sodium chloride 0.9 % bolus 1,000 mL (1,000 mLs Intravenous New Bag/Given 03/14/16 1522)     Initial Impression / Assessment and Plan / ED Course  I have reviewed the triage vital signs and the nursing notes.  Pertinent labs & imaging results that were available during my care of the patient were reviewed by me and considered in my medical decision making (see chart for details).     BP 145/75   Pulse 103   Temp 98.3 F (36.8 C) (Oral)   Resp (!) 27   Ht 5\' 2"  (1.575 m)   Wt 68 kg   SpO2 98%   BMI 27.44 kg/m    Final Clinical Impressions(s) / ED Diagnoses   Final diagnoses:  Dehydration  Hypokalemia    New Prescriptions New Prescriptions   No medications on file   2:31 PM Patient with history of endometrial  cancer with metastases to chest wall and brain s/p radiation and chemotherapy treatment. Pt is here with occasional nausea and vomiting. She reports decrease in appetite but denies any associated pain. She does appears to have orthostatic hypotension causing her symptoms. Labs remarkable for hypokalemia with potassium of 2.9, no EKG changes, will give supplementation.  3:56 PM Pt showing moderate sxs improvement with IVF.  She is currently receiving 2 liter of NS.  Once finish, will recheck orthostatic vital sign and if her HR <90 and felt better, tolerates PO then she can be discharge with close f/u with PCP and oncology.  Encourage decrease usage of pain medication as it can cause constipation.  Care discussed with oncoming provider who will d/c pt pending resolution of sxs.     Domenic Moras, PA-C 03/14/16 [place], MD 03/15/16 [phone]

## 2016-03-14 NOTE — ED Notes (Signed)
ED Provider at bedside. 

## 2016-03-14 NOTE — Discharge Instructions (Signed)
Please follow up with your PCP regarding today's visit and CT finding tomorrow. Take prednisone as prescribed. Take 6 tabs day 1, 5 tabs day 2, 4 tabs day 3, 3 tabs day 4, 2 tabs day 5, and 1 on day 6  Today's CT finding:   IMPRESSION:  1. Increased size of a left frontal lobe metastatic lesion with  increased edema within the surrounding white matter. Mild 3 mm shift  to the right.  2. Difficult visualization of known left cerebellar mass due to the  mass being more hypodense on today's study. There appears to be  increased edema within the left cerebellum and right cerebellum and  therefore suspect that the cerebellar lesion is also increased.  There is mass effect on fourth ventricle and increased mass effect  on the pons and midbrain.  3. Cannot rule out subtle edema within the right frontal lobe white  matter.

## 2016-03-14 NOTE — ED Notes (Signed)
PT have been made aware of urine sample 

## 2016-03-16 ENCOUNTER — Telehealth: Payer: Self-pay | Admitting: Urology

## 2016-03-16 ENCOUNTER — Other Ambulatory Visit: Payer: Self-pay | Admitting: Urology

## 2016-03-16 DIAGNOSIS — C7931 Secondary malignant neoplasm of brain: Secondary | ICD-10-CM

## 2016-03-16 DIAGNOSIS — G936 Cerebral edema: Secondary | ICD-10-CM

## 2016-03-16 MED ORDER — DEXAMETHASONE 4 MG PO TABS: 4.0000 mg | ORAL_TABLET | Freq: Two times a day (BID) | ORAL | 0 refills | Status: AC

## 2016-03-16 NOTE — Telephone Encounter (Signed)
I spoke with [name] to follow up regarding her recent ER visit due to dizziness and unsteady gait ongoing for 1 week.  Dr. Tammi Klippel and I reviewed her CT head which unfortunately shows increased edema and disease progression. She was discharged home from the ED with a Prednisone Dosepak and Potassium.  I advised the patient to discontinue the Prednisone and start Dexamethasone 4mg  po BID.  Escript sent.  Her case is on the schedule for discussion at brain conference on Monday 03/19/16. Advised that [name] will be back in touch with her with further recommendations following conference.  She was most appreciative of the call and stated her compliance with the current plan.    Nicholos Johns, PA-C

## 2016-03-19 ENCOUNTER — Encounter: Payer: Self-pay | Admitting: Hematology and Oncology

## 2016-03-19 NOTE — Progress Notes (Signed)
Faxed income verification to [employer]. Fax received ok per confirmation sheet.

## 2016-03-20 ENCOUNTER — Other Ambulatory Visit: Payer: Self-pay | Admitting: Radiation Therapy

## 2016-03-20 DIAGNOSIS — C7949 Secondary malignant neoplasm of other parts of nervous system: Principal | ICD-10-CM

## 2016-03-20 DIAGNOSIS — C7931 Secondary malignant neoplasm of brain: Secondary | ICD-10-CM

## 2016-03-21 ENCOUNTER — Encounter: Payer: Self-pay | Admitting: Hematology and Oncology

## 2016-03-21 ENCOUNTER — Ambulatory Visit (HOSPITAL_BASED_OUTPATIENT_CLINIC_OR_DEPARTMENT_OTHER): Payer: Medicaid Other | Admitting: Hematology and Oncology

## 2016-03-21 ENCOUNTER — Telehealth: Payer: Self-pay

## 2016-03-21 ENCOUNTER — Ambulatory Visit: Payer: Medicaid Other

## 2016-03-21 ENCOUNTER — Other Ambulatory Visit: Payer: Self-pay | Admitting: Hematology and Oncology

## 2016-03-21 ENCOUNTER — Other Ambulatory Visit (HOSPITAL_BASED_OUTPATIENT_CLINIC_OR_DEPARTMENT_OTHER): Payer: Medicaid Other

## 2016-03-21 VITALS — BP 138/77 | HR 108 | Temp 98.6°F | Resp 19 | Ht 62.0 in | Wt 156.2 lb

## 2016-03-21 DIAGNOSIS — Z86718 Personal history of other venous thrombosis and embolism: Secondary | ICD-10-CM | POA: Diagnosis not present

## 2016-03-21 DIAGNOSIS — C7931 Secondary malignant neoplasm of brain: Secondary | ICD-10-CM

## 2016-03-21 DIAGNOSIS — D500 Iron deficiency anemia secondary to blood loss (chronic): Secondary | ICD-10-CM

## 2016-03-21 DIAGNOSIS — E876 Hypokalemia: Secondary | ICD-10-CM

## 2016-03-21 DIAGNOSIS — I82502 Chronic embolism and thrombosis of unspecified deep veins of left lower extremity: Secondary | ICD-10-CM

## 2016-03-21 DIAGNOSIS — K5903 Drug induced constipation: Secondary | ICD-10-CM

## 2016-03-21 DIAGNOSIS — C541 Malignant neoplasm of endometrium: Secondary | ICD-10-CM | POA: Diagnosis not present

## 2016-03-21 DIAGNOSIS — G893 Neoplasm related pain (acute) (chronic): Secondary | ICD-10-CM

## 2016-03-21 DIAGNOSIS — Z95828 Presence of other vascular implants and grafts: Secondary | ICD-10-CM

## 2016-03-21 DIAGNOSIS — D630 Anemia in neoplastic disease: Secondary | ICD-10-CM | POA: Diagnosis not present

## 2016-03-21 LAB — CBC WITH DIFFERENTIAL/PLATELET
BASO%: 0.3 % (ref 0.0–2.0)
BASOS ABS: 0.1 10*3/uL (ref 0.0–0.1)
EOS%: 0 % (ref 0.0–7.0)
Eosinophils Absolute: 0 10*3/uL (ref 0.0–0.5)
HCT: 29.1 % — ABNORMAL LOW (ref 34.8–46.6)
HGB: 9.5 g/dL — ABNORMAL LOW (ref 11.6–15.9)
LYMPH#: 0.3 10*3/uL — AB (ref 0.9–3.3)
LYMPH%: 1.5 % — AB (ref 14.0–49.7)
MCH: 24.1 pg — AB (ref 25.1–34.0)
MCHC: 32.6 g/dL (ref 31.5–36.0)
MCV: 74.1 fL — ABNORMAL LOW (ref 79.5–101.0)
MONO#: 1 10*3/uL — ABNORMAL HIGH (ref 0.1–0.9)
MONO%: 5.9 % (ref 0.0–14.0)
NEUT%: 92.3 % — AB (ref 38.4–76.8)
NEUTROS ABS: 15.2 10*3/uL — AB (ref 1.5–6.5)
Platelets: 646 10*3/uL — ABNORMAL HIGH (ref 145–400)
RBC: 3.94 10*6/uL (ref 3.70–5.45)
RDW: 21.3 % — AB (ref 11.2–14.5)
WBC: 16.5 10*3/uL — ABNORMAL HIGH (ref 3.9–10.3)

## 2016-03-21 LAB — COMPREHENSIVE METABOLIC PANEL
ALT: 8 U/L (ref 0–55)
ANION GAP: 13 meq/L — AB (ref 3–11)
AST: 7 U/L (ref 5–34)
Albumin: 2.9 g/dL — ABNORMAL LOW (ref 3.5–5.0)
Alkaline Phosphatase: 62 U/L (ref 40–150)
BUN: 21 mg/dL (ref 7.0–26.0)
CHLORIDE: 103 meq/L (ref 98–109)
CO2: 21 meq/L — AB (ref 22–29)
Calcium: 10 mg/dL (ref 8.4–10.4)
Creatinine: 0.8 mg/dL (ref 0.6–1.1)
EGFR: 88 mL/min/{1.73_m2} — AB (ref >90)
GLUCOSE: 149 mg/dL — AB (ref 70–140)
POTASSIUM: 3.2 meq/L — AB (ref 3.5–5.1)
SODIUM: 137 meq/L (ref 136–145)
TOTAL PROTEIN: 7.1 g/dL (ref 6.4–8.3)
Total Bilirubin: 0.35 mg/dL (ref 0.20–1.20)

## 2016-03-21 MED ORDER — HEPARIN SOD (PORK) LOCK FLUSH 100 UNIT/ML IV SOLN
500.0000 [IU] | Freq: Once | INTRAVENOUS | Status: AC
  Administered 2016-03-21: 500 [IU] via INTRAVENOUS
  Filled 2016-03-21: qty 5

## 2016-03-21 MED ORDER — APIXABAN 5 MG PO TABS: 5.0000 mg | ORAL_TABLET | Freq: Two times a day (BID) | ORAL | 1 refills | Status: AC

## 2016-03-21 MED ORDER — SODIUM CHLORIDE 0.9% FLUSH
10.0000 mL | INTRAVENOUS | Status: DC | PRN
  Administered 2016-03-21: 10 mL via INTRAVENOUS
  Filled 2016-03-21: qty 10

## 2016-03-21 NOTE — Assessment & Plan Note (Signed)
The prior diagnosis of blood clot was over a year ago. We discussed extended duration of apixaban for secondary prevention. I spent some time completing paperwork to help getting assistant to pay for extended duration of reduced dose apixaban.

## 2016-03-21 NOTE — Assessment & Plan Note (Signed)
This is likely anemia of chronic disease. The patient denies recent history of bleeding such as epistaxis, hematuria or hematochezia. She is asymptomatic from the anemia. We will observe for now.  I recommend rechecking iron study in the next visit to exclude iron deficiency anemia as a cause of her microcytosis and thrombocytosis For now, I recommend she continue on anticoagulation therapy for recent diagnosis of DVT

## 2016-03-21 NOTE — Assessment & Plan Note (Signed)
This is likely due to poor oral intake.  We discussed potassium rich diet

## 2016-03-21 NOTE — Telephone Encounter (Signed)
Faxed referral to [facility] for PT.

## 2016-03-21 NOTE — Assessment & Plan Note (Addendum)
I review her recent CT scan of the head which show possible worsening lesion in the brain. Radiation oncologist has ordered a repeat MRI of the brain next week. She will continue dexamethasone for now I will consult home PT for physical therapy to gain some strength back since her radiation treatment has been completed

## 2016-03-21 NOTE — Progress Notes (Signed)
[name] OFFICE PROGRESS NOTE  Patient Care Team: No Pcp Per Patient as PCP - General (General Practice)  SUMMARY OF ONCOLOGIC HISTORY:   Endometrial cancer ([place])   02/02/2008 Imaging    Uterine fibroid disease with dominant 5.4 cm fundal fibroid. 5 cm complex cystic abnormality of the left ovary/adnexa requiring follow-up ultrasound.  Cystic neoplasm cannot be excluded.  Follow-up is recommended in 6 to 8 weeks. 4 cm complex cyst of the right ovary.  This should also be followed with the left ovarian abnormality      03/03/2008 Imaging    Three uterine fibroids.  Largest measures up to 6.4 cm. Fluid within the endometrial canal with endometrial lining thickness 9.9 mm. Septated fluid containing structure adjacent to the right ovary may represent hydrosalpinx.  Ovarian mass cannot be excluded.  Given this finding, persistent vaginal bleeding and previously described adnexal abnormalities, MR of the pelvis with attention to the uterus ovary is recommended.      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 RIGHT OVARY, EXCISION: - HIGH GRADE SURFACE EPITHELIAL CARCINOMA, 2.7 CM, CONFINED WITHIN RIGHT OVARY. - NO ANGIOLYMPHATIC INVASION IDENTIFIED. - MATURE CYSTIC TERATOMA. - FALLOPIAN TUBE: NO PATHOLOGIC ABNORMALITIES. - PLEASE SEE ONCOLOGY TEMPLATE  2. 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 - ANGIOLYMPHATIC INVASION PRESENT. - LEIOMYOMATA. - CERVIX: BENIGN SQUAMOUS MUCOSA AND ENDOCERVICAL MUCOSA, NO DYSPLASIA  OR MALIGNANCY IDENTIFIED.  LEFT OVARY: - HIGH GRADE SURFACE EPITHELIAL CARCINOMA, 5.5 CM, CAPSULE RUPTURED; NO ANGIOLYMPHATIC INVASION SEEN. - FALLOPIAN TUBAL TISSUE WITH PARATUBAL CYST. - PLEASE SEE ONCOLOGY TEMPLATE.  3. OMENTUM, RESECTION: NEGATIVE FOR MALIGNANCY.      03/18/2008 Pathology Results    PERITONEAL WASHING (THIN PREPARATION AND CELL BLOCK): POSITIVE FOR ADENOCARCINOMA      09/21/2008 PET scan    FDG avid ascending colonic intraluminal mass, worrisome for colonic neoplasm.  The adjacent right pericolic gutter soft tissue nodule does not demonstrate F D G avidity, possibly due to its small size, but remains worrisome due to its proximity to the intraluminal mass and history of ovarian cancer. No abnormal F D G uptake in the neck, chest, or abdomen. Mild asymmetric soft tissue prominence of the anterior right parapharyngeal soft tissues, amenable to direct visualization.       12/23/2008 Miscellaneous    The patient completed external beam radiation therapy and high-dose rate brachytherapy as part of management of her stage IC endometrioid adenocarcinoma.  The patient completed her radiation treatments on 12/23/2008       02/12/2015 Relapse/Recurrence    Patient reported right inguinal mass and LLE pain when she presented to ED on 02-12-15, all due to disease relapse      02/12/2015 Imaging    Status post hysterectomy. 8.5 x 7.1 cm necrotic right inguinal node, corresponding to the palpable abnormality, suspicious for nodal metastasis. Additional retroperitoneal/ left pelvic nodal metastases, as above. 6.1 cm low-density lesion in the left psoas muscle, favored to reflect tumor, less likely infection. Secondary involvement of the left L4 and L5 vertebral bodies. Associated acute deep venous thrombosis involving the  left external iliac vein and extending into the left lower extremity, likely secondary to extrinsic compression by pelvic tumor. Pulmonary nodules/metastases  at the lung bases, measuring up to 2.4 cm in the left lower lobe.      02/14/2015 Procedure    Aspirate and core biopsy performed of enlarged and necrotic right inguinal lymph node. Aspirated fluid was sent for cytologic analysis. Solid tissue was sent for histopathologic analysis.       02/14/2015 Pathology Results    Diagnosis Lymph node, needle/core biopsy, right inguinal METASTATIC CARCINOMA WITH PREDOMINANT COAGULATIVE NECROSIS CONSISTENT WITH GYN PRIMARY Microscopic Comment The biopsy consist of metastatic carcinoma with coagulative necrosis. The neoplasm stains positive for ck7 and ER, supporting these are GYN primary.      02/14/2015 Pathology Results    Diagnosis LYMPH NODE, FINE NEEDLE ASPIRATION, RIGHT INGUINAL(SPECIMEN 1 OF 1 COLLECTED 02/14/15): MALIGNANT CELLS CONSISTENT WITH METASTATIC ADENOCARCINOMA.      02/14/2015 Tumor Marker    Patient's tumor was tested for the following markers: CA125 Results of the tumor marker test revealed 642.9      02/24/2015 - 05/26/2015 Chemotherapy    She received a few cycles of carboplatin and Taxotere      03/14/2015 Tumor Marker    Patient's tumor was tested for the following markers: CA125 Results of the tumor marker test revealed 427.8      04/12/2015 Procedure    Successful ultrasound-guided aspiration of a total of approximately 180 cc of brown colored fluid from symptomatic necrotic right inguinal lymph node. Note, if the cystic component of this lymph node reoccurs in the come symptomatic, the patient may benefit from a surgical resection      04/21/2015 Tumor Marker    Patient's tumor was tested for the following markers: CA125 Results of the tumor marker test revealed 111.9      05/12/2015 Pathology Results    Debridement, right inguinal - ADENOCARCINOMA ASSOCIATED WITH HEMATOMA. - SEE MICROSCOPIC DESCRIPTION. Microscopic Comment Within the dermis and subcutaneous tissue there is a hemorrhagic cavity surrounded by  granulation tissue and abundant hemosiderin deposition consistent with hematoma. In the adjacent connective tissue there are cystic glands with papillary protrusions consistent with adenocarcinoma. The morphologic features are most consistent with the previous ovarian carcinoma from 2010 ([id number]). (JDP:gt, 05/13/15)       05/12/2015 Pathology Results    CYTOLOGY FLUID: RIGHT INGUINAL FLUID(SPECIMEN 1 OF 1 COLLECTED 05/12/15):Marland Kitchen ABUNDANT BLOOD AND RARE ATYPICAL CELLS SUSPICIOUS FOR CARCINOMA      05/12/2015 Surgery    Procedure: Incision and drainage of right inguinal fluid collection with capsulectomy of seroma cavity Right inguinal wound VAC placement (10 x 9 x 6 cm) Operative Findings: Liquefied thick maroon colored fluid without odor. Well-defined capsule around the fluid collection. After resection of capsule there was adequate tissue overlying the vessels, therefore mobilization of sartorius was not necessary.      05/23/2015 Tumor Marker    Patient's tumor was tested for the following markers: CA125 Results of the tumor marker test revealed 48      06/14/2015 Imaging    Significant interval decrease in size of bilateral pulmonary nodules and masses as well as retroperitoneal, pelvic and inguinal adenopathy. Mass within the left upper lobe may represent metastatic disease or primary pulmonary malignancy. Interval increase in size of large cystic mass within the left hilum. Interval sclerosis of the L4 and L5 vertebral body metastasis.       06/14/2015 Genetic Testing    Genetics testing negative  Breast Ovarian and gyn panel by [facility] 06-14-15.      07/07/2015 Tumor Marker    Patient's tumor was tested for the following markers: CA125 Results of the tumor marker test revealed 86.6      08/01/2015 PET scan    Interval progression, as described above. Dominant 7.8 cm left upper lobe mass abuts the mediastinum. Additional left upper lobe/ left lower lobe masses and thoracic  lymphadenopathy. Additional retroperitoneal/left pelvic nodal metastases. Postsurgical changes in the right inguinal region with mild residual hypermetabolism.      08/10/2015 Procedure    Successful placement of a right IJ approach Power Port with ultrasound and fluoroscopic guidance. The catheter is ready for use.      08/11/2015 Miscellaneous    She received 1 dose of gemzar      08/18/2015 Tumor Marker    Patient's tumor was tested for the following markers: CA125 Results of the tumor marker test revealed 171      08/19/2015 - 02/17/2016 Anti-estrogen oral therapy    She was placed on Letrozole, stopped due to progression of disease      08/24/2015 - 09/15/2015 Radiation Therapy    08/24/2015-09/15/2015: Left Lung, 37.5 Gy in 15 fractions by Dr. Sondra Come.       09/23/2015 Imaging    Decrease in some of the pulmonary metastatic lesions as described above. The mass lesion abutting the mediastinum on the left has decreased in size from the prior exam. New mild pericardial effusion. Increase in size in a pelvic lymphadenopathy in the iliac chain. The left psoas mass and periaortic lymph node as well as changes in the right inguinal region are relatively stable as described. No new focal abnormality is seen.      09/28/2015 Imaging    Cerebellar edema centrally on the left with suggestion of an underlying 3.5 cm left cerebellar mass. Mass effect on the fourth ventricle but no ventriculomegaly or loss of basilar cistern patency. 2. Cerebellar metastatic disease favored in this clinical setting. Follow-up Brain MRI without and with contrast would characterize further. 3. No other acute or metastatic intracranial abnormality identified.      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 Vasogenic edema with mild mass effect on the fourth ventricle, no obstructive hydrocephalus. Otherwise negative MRI of the head for age.       10/07/2015 Imaging    Known left  cerebellar metastasis with vasogenic edema. Subjectively less mass effect on the adjacent fourth ventricle, albeit minimal.      10/11/2015 - 10/11/2015 Radiation Therapy    10/07/15: PTV1 Lt Cerebellar 23 mm was treated to 18 Gy in one fraction by Dr. Tammi Klippel.      10/31/2015 Tumor Marker    Patient's tumor was tested for the following markers: CA125 Results of the tumor marker test revealed 82.3      12/12/2015 Tumor Marker    Patient's tumor was tested for the following markers: CA125 Results of the tumor marker test revealed 92.5      01/03/2016 Imaging    Exam detail is diminished secondary to motion artifact. This predominantly affects the segmental pulmonary arteries of the left lung. Within this limitation no evidence for acute pulmonary embolus noted. 2. Increase in volume of left pericardial. 3. Mixed response to therapy with respect to mediastinal adenopathy and pulmonary lesions. The dominant left upper lobe lung mass is decreased in size in the interval. There is a nodule within the  right lower lobe which is increased in size in the interval. 4. Pre-vascular lymph node is mildly decreased in size in the interval. Large necrotic cystic nodal mass within the AP window region is stable in the interval.      01/05/2016 Imaging    Interval increase in size of left cerebellar metastasis, now measuring 2.4 x 2.6 x 2.0 cm. Similar localized edema and mass effect on the adjacent fourth ventricle. Fourth ventricle remains patent without obstructive hydrocephalus. 2. Interval development of a second 1.3 x 1.3 x 1.1 cm metastatic lesion within the left periventricular white matter of the left frontal lobe. Mild localized edema without significant mass effect.       01/09/2016 Imaging    LV EF: 60% -   65%      01/26/2016 Tumor Marker    Patient's tumor was tested for the following markers: CA125 Results of the tumor marker test revealed 211.9      02/01/2016 - 02/01/2016 Radiation  Therapy    Left frontal 13 mm target was treated using 3 Dynamic Conformal Arcs to a prescription dose of 20 Gy.      02/17/2016 -  Anti-estrogen oral therapy    She started taking Megace      03/07/2016 Tumor Marker    Patient's tumor was tested for the following markers: CA125 Results of the tumor marker test revealed 304      03/14/2016 Imaging    Ct head: Increased size of a left frontal lobe metastatic lesion with increased edema within the surrounding white matter. Mild 3 mm shift to the right. 2. Difficult visualization of known left cerebellar mass due to the mass being more hypodense on today's study. There appears to be increased edema within the left cerebellum and right cerebellum and therefore suspect that the cerebellar lesion is also increased. There is mass effect on fourth ventricle and increased mass effect on the pons and midbrain. 3. Cannot rule out subtle edema within the right frontal lobe white matter.       INTERVAL HISTORY: Please see below for problem oriented charting. She returns today with multiple family members. She feels better. She went to the emergency department a week ago for CT imaging.  She was recommended admission but the patient declined. She was started on high-dose steroids and since then she has felt better. Denies headache or confusion. She continues to feel weak. Denies chest pain or shortness of breath. She is eating better and has gained some weight. She denies further constipation. Her pain is under excellent control.  REVIEW OF SYSTEMS:   Constitutional: Denies fevers, chills or abnormal weight loss Eyes: Denies blurriness of vision Ears, nose, mouth, throat, and face: Denies mucositis or sore throat Respiratory: Denies cough, dyspnea or wheezes Cardiovascular: Denies palpitation, chest discomfort or lower extremity swelling Gastrointestinal:  Denies nausea, heartburn or change in bowel habits Skin: Denies abnormal skin  rashes Lymphatics: Denies new lymphadenopathy or easy bruising Neurological:Denies numbness, tingling or new weaknesses Behavioral/Psych: Mood is stable, no new changes  All other systems were reviewed with the patient and are negative.  I have reviewed the past medical history, past surgical history, social history and family history with the patient and they are unchanged from previous note.  ALLERGIES:  is allergic to morphine and related and dilaudid [hydromorphone hcl].  MEDICATIONS:  Current Outpatient Prescriptions  Medication Sig Dispense Refill  . amLODipine (NORVASC) 5 MG tablet TAKE 1 TABLET BY MOUTH TWICE A DAY 60 tablet 6  .  apixaban (ELIQUIS) 5 MG TABS tablet Take 1 tablet (5 mg total) by mouth 2 (two) times daily. 180 tablet 1  . benzonatate (TESSALON) 100 MG capsule TK ONE C PO  BID PRN for cough  0  . dexamethasone (DECADRON) 4 MG tablet Take 1 tablet (4 mg total) by mouth 2 (two) times daily with a meal. 60 tablet 0  . levETIRAcetam (KEPPRA) 500 MG tablet Take 1 tablet (500 mg total) by mouth 2 (two) times daily. 60 tablet 1  . lidocaine-prilocaine (EMLA) cream Apply to Porta-cath 1-2 hrs prior to access as directed. 30 g 1  . LORazepam (ATIVAN) 0.5 MG tablet Take 1 tablet (0.5 mg total) by mouth every 6 (six) hours as needed (nausea). (Patient taking differently: Take 0.5 mg by mouth every 6 (six) hours as needed (for nausea). ) 30 tablet 0  . megestrol (MEGACE) 40 MG tablet Take 1 tablet (40 mg total) by mouth 2 (two) times daily. 60 tablet 1  . morphine (MSIR) 30 MG tablet Take 1 tablet (30 mg total) by mouth every 6 (six) hours as needed for moderate pain or severe pain. 60 tablet 0  . Multiple Vitamins-Minerals (ADULT GUMMY) CHEW Chew 2 each by mouth daily.    . pantoprazole (PROTONIX) 40 MG tablet Take 1 tablet (40 mg total) by mouth daily. 30 tablet 11  . polyethylene glycol (MIRALAX / GLYCOLAX) packet Take 17 g by mouth daily.     Marland Kitchen PROAIR HFA 108 (90 Base) MCG/ACT  inhaler Inhale 1-2 puffs into the lungs every 4 (four) hours as needed for wheezing or shortness of breath.   3  . senna (SENOKOT) 8.6 MG tablet Take 1 tablet by mouth daily.    . VOLTAREN 1 % GEL APPLY TO AFFECTED AREA EVERY 8 TO 12 HOURS AS NEEDED FOR PAIN  4  . zolpidem (AMBIEN) 5 MG tablet Take 1 tablet (5 mg total) by mouth at bedtime as needed for sleep. 30 tablet 1  . potassium chloride (K-DUR) 10 MEQ tablet Take 2 tablets twice a day or as directed. (Patient not taking: Reported on 02/17/2016) 120 tablet 0  . potassium chloride SA (K-DUR,KLOR-CON) 20 MEQ tablet Take 1 tablet (20 mEq total) by mouth 2 (two) times daily. 6 tablet 0  . prochlorperazine (COMPAZINE) 10 MG tablet Take 1 tablet (10 mg total) by mouth every 6 (six) hours as needed for nausea or vomiting. (Patient not taking: Reported on 02/17/2016) 20 tablet 0   No current facility-administered medications for this visit.     PHYSICAL EXAMINATION: ECOG PERFORMANCE STATUS: 2 - Symptomatic, <50% confined to bed  Vitals:   03/21/16 0931 03/21/16 0941  BP: 138/77   Pulse: (!) 126 (!) 108  Resp: 19   Temp: 98.6 F (37 C)    Filed Weights   03/21/16 0931  Weight: 156 lb 3.2 oz (70.9 kg)    GENERAL:alert, no distress and comfortable SKIN: skin color, texture, turgor are normal, no rashes or significant lesions EYES: normal, Conjunctiva are pink and non-injected, sclera clear OROPHARYNX:no exudate, no erythema and lips, buccal mucosa, and tongue normal  NECK: supple, thyroid normal size, non-tender, without nodularity LYMPH:  no palpable lymphadenopathy in the cervical, axillary or inguinal LUNGS: clear to auscultation and percussion with normal breathing effort HEART: regular rate & rhythm and no murmurs and no lower extremity edema ABDOMEN:abdomen soft, non-tender and normal bowel sounds Musculoskeletal:no cyanosis of digits and no clubbing  NEURO: alert & oriented x 3 with fluent  speech, no focal motor/sensory  deficits  LABORATORY DATA:  I have reviewed the data as listed    Component Value Date/Time   NA 137 03/21/2016 0901   K 3.2 (L) 03/21/2016 0901   CL 103 03/14/2016 1219   CO2 21 (L) 03/21/2016 0901   GLUCOSE 149 (H) 03/21/2016 0901   BUN 21.0 03/21/2016 0901   CREATININE 0.8 03/21/2016 0901   CALCIUM 10.0 03/21/2016 0901   PROT 7.1 03/21/2016 0901   ALBUMIN 2.9 (L) 03/21/2016 0901   AST 7 03/21/2016 0901   ALT 8 03/21/2016 0901   ALKPHOS 62 03/21/2016 0901   BILITOT 0.35 03/21/2016 0901   GFRNONAA >60 03/14/2016 1219   GFRAA >60 03/14/2016 1219    No results found for: SPEP, UPEP  Lab Results  Component Value Date   WBC 16.5 (H) 03/21/2016   NEUTROABS 15.2 (H) 03/21/2016   HGB 9.5 (L) 03/21/2016   HCT 29.1 (L) 03/21/2016   MCV 74.1 (L) 03/21/2016   PLT 646 (H) 03/21/2016      Chemistry      Component Value Date/Time   NA 137 03/21/2016 0901   K 3.2 (L) 03/21/2016 0901   CL 103 03/14/2016 1219   CO2 21 (L) 03/21/2016 0901   BUN 21.0 03/21/2016 0901   CREATININE 0.8 03/21/2016 0901      Component Value Date/Time   CALCIUM 10.0 03/21/2016 0901   ALKPHOS 62 03/21/2016 0901   AST 7 03/21/2016 0901   ALT 8 03/21/2016 0901   BILITOT 0.35 03/21/2016 0901       RADIOGRAPHIC STUDIES: I have personally reviewed the radiological images as listed and agreed with the findings in the report. Ct Head Wo Contrast  Result Date: 03/14/2016 CLINICAL DATA:  History of endometrial cancer with, recurrent nausea and vomiting EXAM: CT HEAD WITHOUT CONTRAST TECHNIQUE: Contiguous axial images were obtained from the base of the skull through the vertex without intravenous contrast. COMPARISON:  01/05/2016, CT head 10/07/2015 FINDINGS: Brain: No hemorrhage is visualized. Increased size of a hypodense mass in the left frontal lobe measuring 1.9 x 1.7 cm, compared with 1.3 x 1.3 cm on prior MRI. Increased low attenuation surrounding the mass compatible with increased edema. New 3 mm  midline shift to the right. Difficult to exclude subtle focus of low attenuation within the right frontal lobe white matter. Increased low-attenuation left basal ganglia. Diffuse low attenuation within the left cerebellum with increased edema/ low attenuation now seen in the right cerebellum. Known mass lesion in the left cerebellum is difficult to measure as it is now more hypodense, suspect that it is increased in size given increased edema within the posterior fossa. Mild mass effect on fourth ventricle. Mass effect on the left posterior pons and midbrain. The lateral and third ventricles are slightly enlarged compared to prior. Vascular: No hyperdense vessels. Vertebral artery calcifications. Scattered calcifications at the carotid siphons. Skull: Left parietal sclerotic bone protuberance unchanged. No fracture. Sinuses/Orbits: No acute finding. Other: None IMPRESSION: 1. Increased size of a left frontal lobe metastatic lesion with increased edema within the surrounding white matter. Mild 3 mm shift to the right. 2. Difficult visualization of known left cerebellar mass due to the mass being more hypodense on today's study. There appears to be increased edema within the left cerebellum and right cerebellum and therefore suspect that the cerebellar lesion is also increased. There is mass effect on fourth ventricle and increased mass effect on the pons and midbrain. 3. Cannot rule out subtle  edema within the right frontal lobe white matter. Electronically Signed   By: Donavan Foil M.D.   On: 03/14/2016 18:51    ASSESSMENT & PLAN:  Endometrial cancer ([place]) She has improved overall since she started taking medications as directed. There is no doubt the dexamethasone has also improve her overall symptoms. I recommend increasing the dose of Megace slowly with goal of 80 mg twice a day. I will see her again in 2 weeks for further supportive care  Metastasis to brain [facility]) I review her recent CT scan of the  head which show possible worsening lesion in the brain. Radiation oncologist has ordered a repeat MRI of the brain next week. She will continue dexamethasone for now I will consult home PT for physical therapy to gain some strength back since her radiation treatment has been completed  Anemia in neoplastic disease This is likely anemia of chronic disease. The patient denies recent history of bleeding such as epistaxis, hematuria or hematochezia. She is asymptomatic from the anemia. We will observe for now.  I recommend rechecking iron study in the next visit to exclude iron deficiency anemia as a cause of her microcytosis and thrombocytosis For now, I recommend she continue on anticoagulation therapy for recent diagnosis of DVT  Leg DVT (deep venous thromboembolism), chronic, left ([place]) The prior diagnosis of blood clot was over a year ago. We discussed extended duration of apixaban for secondary prevention. I spent some time completing paperwork to help getting assistant to pay for extended duration of reduced dose apixaban.  Hypokalemia due to inadequate potassium intake This is likely due to poor oral intake.  We discussed potassium rich diet  Cancer associated pain Her pain control is excellent.  She only took 1 dose of morphine sulfate yesterday.  She will continue to take pain medicine as needed  Constipation due to pain medication After extensive discussion in the last visit, she is taking Senokot on a regular basis and denies further constipation. We will continue aggressive laxative therapy while she is on pain medicine   Orders Placed This Encounter  Procedures  . Ferritin    Standing Status:   Future    Standing Expiration Date:   04/25/2017  . Iron and TIBC    Standing Status:   Future    Standing Expiration Date:   04/25/2017   All questions were answered. The patient knows to call the clinic with any problems, questions or concerns. No barriers to learning was  detected. I spent 30 minutes counseling the patient face to face. The total time spent in the appointment was 40 minutes and more than 50% was on counseling and review of test results     Heath Lark, MD 03/21/2016 10:32 AM

## 2016-03-21 NOTE — Progress Notes (Signed)
Received denial letter from [facility] fo assistance with Eliquis due to having insurance coverage. [facility] will also mail patient a letter.

## 2016-03-21 NOTE — Progress Notes (Signed)
Patient returned call. Advised her that Medicaid does cover her Eliquis and she has a $3 copay. Patient verbalized understanding and states "sounds good to me". Patient has my card for any additional financial questions or concerns.

## 2016-03-21 NOTE — Progress Notes (Signed)
Called [facility] to confirm Eliquis covered by patient's Medicaid which patient previously stated it was not. Pharmacy staff confirmed it was and she just has a $3 copay. Called patient and left a voicemail to return my call so that I may give her this information. Patient has my name and number for any additional financial questions or concerns.

## 2016-03-21 NOTE — Patient Instructions (Signed)
Implanted Port Home Guide An implanted port is a type of central line that is placed under the skin. Central lines are used to provide IV access when treatment or nutrition needs to be given through a person's veins. Implanted ports are used for long-term IV access. An implanted port may be placed because:  You need IV medicine that would be irritating to the small veins in your hands or arms.  You need long-term IV medicines, such as antibiotics.  You need IV nutrition for a long period.  You need frequent blood draws for lab tests.  You need dialysis.  Implanted ports are usually placed in the chest area, but they can also be placed in the upper arm, the abdomen, or the leg. An implanted port has two main parts:  Reservoir. The reservoir is round and will appear as a small, raised area under your skin. The reservoir is the part where a needle is inserted to give medicines or draw blood.  Catheter. The catheter is a thin, flexible tube that extends from the reservoir. The catheter is placed into a large vein. Medicine that is inserted into the reservoir goes into the catheter and then into the vein.  How will I care for my incision site? Do not get the incision site wet. Bathe or shower as directed by your health care provider. How is my port accessed? Special steps must be taken to access the port:  Before the port is accessed, a numbing cream can be placed on the skin. This helps numb the skin over the port site.  Your health care provider uses a sterile technique to access the port. ? Your health care provider must put on a mask and sterile gloves. ? The skin over your port is cleaned carefully with an antiseptic and allowed to dry. ? The port is gently pinched between sterile gloves, and a needle is inserted into the port.  Only "non-coring" port needles should be used to access the port. Once the port is accessed, a blood return should be checked. This helps ensure that the port  is in the vein and is not clogged.  If your port needs to remain accessed for a constant infusion, a clear (transparent) bandage will be placed over the needle site. The bandage and needle will need to be changed every week, or as directed by your health care provider.  Keep the bandage covering the needle clean and dry. Do not get it wet. Follow your health care provider's instructions on how to take a shower or bath while the port is accessed.  If your port does not need to stay accessed, no bandage is needed over the port.  What is flushing? Flushing helps keep the port from getting clogged. Follow your health care provider's instructions on how and when to flush the port. Ports are usually flushed with saline solution or a medicine called heparin. The need for flushing will depend on how the port is used.  If the port is used for intermittent medicines or blood draws, the port will need to be flushed: ? After medicines have been given. ? After blood has been drawn. ? As part of routine maintenance.  If a constant infusion is running, the port may not need to be flushed.  How long will my port stay implanted? The port can stay in for as long as your health care provider thinks it is needed. When it is time for the port to come out, surgery will be   done to remove it. The procedure is similar to the one performed when the port was put in. When should I seek immediate medical care? When you have an implanted port, you should seek immediate medical care if:  You notice a bad smell coming from the incision site.  You have swelling, redness, or drainage at the incision site.  You have more swelling or pain at the port site or the surrounding area.  You have a fever that is not controlled with medicine.  This information is not intended to replace advice given to you by your health care provider. Make sure you discuss any questions you have with your health care provider. Document  Released: 12/18/2004 Document Revised: 05/26/2015 Document Reviewed: 08/25/2012 Elsevier Interactive Patient Education  2017 [employer]  

## 2016-03-21 NOTE — Assessment & Plan Note (Signed)
Her pain control is excellent.  She only took 1 dose of morphine sulfate yesterday.  She will continue to take pain medicine as needed

## 2016-03-21 NOTE — Assessment & Plan Note (Signed)
After extensive discussion in the last visit, she is taking Senokot on a regular basis and denies further constipation. We will continue aggressive laxative therapy while she is on pain medicine

## 2016-03-21 NOTE — Assessment & Plan Note (Signed)
She has improved overall since she started taking medications as directed. There is no doubt the dexamethasone has also improve her overall symptoms. I recommend increasing the dose of Megace slowly with goal of 80 mg twice a day. I will see her again in 2 weeks for further supportive care

## 2016-03-22 ENCOUNTER — Telehealth: Payer: Self-pay | Admitting: Emergency Medicine

## 2016-03-22 ENCOUNTER — Telehealth: Payer: Self-pay

## 2016-03-22 NOTE — Telephone Encounter (Signed)
Call received from [facility] to notify this office that her PT referral is denied due to her insurance guidelines.

## 2016-03-22 NOTE — Telephone Encounter (Signed)
[facility] called to decline patient for Physical Therapy states that Medicaid does not pay for PT in home.

## 2016-03-22 NOTE — Telephone Encounter (Signed)
Left below message. 

## 2016-03-22 NOTE — Telephone Encounter (Signed)
OK, please let her daughter know

## 2016-03-27 ENCOUNTER — Telehealth: Payer: Self-pay | Admitting: *Deleted

## 2016-03-27 NOTE — Telephone Encounter (Signed)
LM with Dr Alvy Bimler note below

## 2016-03-27 NOTE — Telephone Encounter (Signed)
"  I'm having constipation.  Straining with bowel movements.  Don't know what's worse, constipation of diarrhea.  Last BM was this morning.  Soft formed BM.  There was blood in the water and on the tissue.  Blood not new, had BM yesterday.  Taking Gwendolyn Lima and Milk of Magnesia daily.  Nauseated but able to eat and drink.  Abdomen is a little bloated."  Return number [phone].

## 2016-03-27 NOTE — Telephone Encounter (Signed)
If she is straining it usually is related to reduced liquid intake and lack of fibre I will continue daily Miralax, MOM and add sennokot 2 tablets daily to continue regular BM Make sure she drinks at least 4 bottles of water (2000 ml) per day

## 2016-03-28 ENCOUNTER — Ambulatory Visit (HOSPITAL_COMMUNITY): Payer: Medicaid Other

## 2016-03-29 ENCOUNTER — Telehealth: Payer: Self-pay

## 2016-03-29 ENCOUNTER — Ambulatory Visit: Payer: Medicaid Other

## 2016-03-29 ENCOUNTER — Other Ambulatory Visit: Payer: Self-pay

## 2016-03-29 ENCOUNTER — Ambulatory Visit
Admission: RE | Admit: 2016-03-29 | Discharge: 2016-03-29 | Disposition: A | Discharge status: Home / Self Care | Payer: Medicaid Other | Source: Ambulatory Visit | Attending: Radiation Oncology | Admitting: Radiation Oncology

## 2016-03-29 ENCOUNTER — Telehealth: Payer: Self-pay | Admitting: Urology

## 2016-03-29 DIAGNOSIS — C541 Malignant neoplasm of endometrium: Secondary | ICD-10-CM

## 2016-03-29 MED ORDER — LEVETIRACETAM 500 MG PO TABS: 500.0000 mg | ORAL_TABLET | Freq: Two times a day (BID) | ORAL | 11 refills | Status: DC

## 2016-03-29 MED ORDER — ZOLPIDEM TARTRATE 5 MG PO TABS: 5.0000 mg | ORAL_TABLET | Freq: Every evening | ORAL | 5 refills | Status: DC | PRN

## 2016-03-29 NOTE — Telephone Encounter (Signed)
Can we refill electronically? 30 days supply, 11 refills

## 2016-03-29 NOTE — Telephone Encounter (Signed)
I attempted to return a call to the patient regarding some bowel issues that she is experiencing.  I had to leave a message.  It appears that this was also addressed through Dr. Calton Dach office so I asked that she return our call if she has further questions or concerns.

## 2016-03-29 NOTE — Telephone Encounter (Signed)
[facility] called about refill for Ambien and Keppra. Dr. Marko Plume refilled the last time.

## 2016-03-30 ENCOUNTER — Telehealth: Payer: Self-pay | Admitting: Radiation Oncology

## 2016-03-30 NOTE — Addendum Note (Signed)
Encounter addended by: Heywood Footman, RN on: 03/30/2016 12:16 PM<BR>    Actions taken: Charge Capture section accepted

## 2016-03-30 NOTE — Telephone Encounter (Signed)
Understand for [name] the patient feels "unhinged" and believes it is related to her decadron. Phoned patient to inquire. Patient denies any issue with decadron but, reports constipation. Inquired about what she has taken for constipation. Her response was Imodium. Explained Imodium is given for diarrhea and Miralax, MOM or Senokot should be used for constipation. Patient verbalized understanding.

## 2016-04-03 ENCOUNTER — Ambulatory Visit (HOSPITAL_BASED_OUTPATIENT_CLINIC_OR_DEPARTMENT_OTHER): Payer: Self-pay

## 2016-04-03 ENCOUNTER — Other Ambulatory Visit: Payer: Self-pay | Admitting: Hematology and Oncology

## 2016-04-03 ENCOUNTER — Ambulatory Visit (HOSPITAL_BASED_OUTPATIENT_CLINIC_OR_DEPARTMENT_OTHER): Payer: Self-pay | Admitting: Hematology and Oncology

## 2016-04-03 ENCOUNTER — Ambulatory Visit: Payer: Self-pay | Admitting: Nurse Practitioner

## 2016-04-03 ENCOUNTER — Encounter: Payer: Self-pay | Admitting: Hematology and Oncology

## 2016-04-03 ENCOUNTER — Ambulatory Visit (HOSPITAL_COMMUNITY)
Admission: RE | Admit: 2016-04-03 | Discharge: 2016-04-03 | Disposition: A | Discharge status: Home / Self Care | Payer: Self-pay | Source: Ambulatory Visit | Attending: Hematology and Oncology | Admitting: Hematology and Oncology

## 2016-04-03 ENCOUNTER — Other Ambulatory Visit (HOSPITAL_BASED_OUTPATIENT_CLINIC_OR_DEPARTMENT_OTHER): Payer: Self-pay

## 2016-04-03 ENCOUNTER — Telehealth: Payer: Self-pay | Admitting: Hematology and Oncology

## 2016-04-03 VITALS — BP 145/71 | HR 100 | Temp 98.7°F | Resp 17 | Ht 62.0 in | Wt 154.2 lb

## 2016-04-03 DIAGNOSIS — C541 Malignant neoplasm of endometrium: Secondary | ICD-10-CM

## 2016-04-03 DIAGNOSIS — D630 Anemia in neoplastic disease: Secondary | ICD-10-CM

## 2016-04-03 DIAGNOSIS — K5903 Drug induced constipation: Secondary | ICD-10-CM

## 2016-04-03 DIAGNOSIS — C7931 Secondary malignant neoplasm of brain: Secondary | ICD-10-CM

## 2016-04-03 DIAGNOSIS — D500 Iron deficiency anemia secondary to blood loss (chronic): Secondary | ICD-10-CM

## 2016-04-03 DIAGNOSIS — R634 Abnormal weight loss: Secondary | ICD-10-CM

## 2016-04-03 DIAGNOSIS — C7800 Secondary malignant neoplasm of unspecified lung: Secondary | ICD-10-CM

## 2016-04-03 DIAGNOSIS — I82502 Chronic embolism and thrombosis of unspecified deep veins of left lower extremity: Secondary | ICD-10-CM

## 2016-04-03 DIAGNOSIS — Z95828 Presence of other vascular implants and grafts: Secondary | ICD-10-CM

## 2016-04-03 LAB — CBC WITH DIFFERENTIAL/PLATELET
BASO%: 1 % (ref 0.0–2.0)
Basophils Absolute: 0.1 10*3/uL (ref 0.0–0.1)
EOS ABS: 0.1 10*3/uL (ref 0.0–0.5)
EOS%: 0.4 % (ref 0.0–7.0)
HCT: 24.9 % — ABNORMAL LOW (ref 34.8–46.6)
HGB: 8.2 g/dL — ABNORMAL LOW (ref 11.6–15.9)
LYMPH%: 2.3 % — AB (ref 14.0–49.7)
MCH: 24.2 pg — ABNORMAL LOW (ref 25.1–34.0)
MCHC: 32.8 g/dL (ref 31.5–36.0)
MCV: 73.9 fL — AB (ref 79.5–101.0)
MONO#: 0.6 10*3/uL (ref 0.1–0.9)
MONO%: 4.6 % (ref 0.0–14.0)
NEUT%: 91.7 % — AB (ref 38.4–76.8)
NEUTROS ABS: 12.7 10*3/uL — AB (ref 1.5–6.5)
PLATELETS: 344 10*3/uL (ref 145–400)
RBC: 3.37 10*6/uL — AB (ref 3.70–5.45)
RDW: 22.5 % — ABNORMAL HIGH (ref 11.2–14.5)
WBC: 13.8 10*3/uL — AB (ref 3.9–10.3)
lymph#: 0.3 10*3/uL — ABNORMAL LOW (ref 0.9–3.3)

## 2016-04-03 LAB — COMPREHENSIVE METABOLIC PANEL
ALK PHOS: 71 U/L (ref 40–150)
ALT: 11 U/L (ref 0–55)
ANION GAP: 11 meq/L (ref 3–11)
AST: 8 U/L (ref 5–34)
Albumin: 2.4 g/dL — ABNORMAL LOW (ref 3.5–5.0)
BILIRUBIN TOTAL: 0.32 mg/dL (ref 0.20–1.20)
BUN: 18 mg/dL (ref 7.0–26.0)
CALCIUM: 9.1 mg/dL (ref 8.4–10.4)
CO2: 22 meq/L (ref 22–29)
Chloride: 108 mEq/L (ref 98–109)
Creatinine: 0.7 mg/dL (ref 0.6–1.1)
EGFR: >90 mL/min/{1.73_m2} (ref >90)
Glucose: 98 mg/dl (ref 70–140)
Potassium: 3.3 mEq/L — ABNORMAL LOW (ref 3.5–5.1)
Sodium: 141 mEq/L (ref 136–145)
Total Protein: 6.2 g/dL — ABNORMAL LOW (ref 6.4–8.3)

## 2016-04-03 LAB — IRON AND TIBC
%SAT: 10 % — ABNORMAL LOW (ref 21–57)
IRON: 15 ug/dL — AB (ref 41–142)
TIBC: 155 ug/dL — AB (ref 236–444)
UIBC: 140 ug/dL (ref 120–384)

## 2016-04-03 LAB — FERRITIN

## 2016-04-03 LAB — PREPARE RBC (CROSSMATCH)

## 2016-04-03 MED ORDER — SODIUM CHLORIDE 0.9 % IV SOLN
250.0000 mL | Freq: Once | INTRAVENOUS | Status: AC
  Administered 2016-04-03: 250 mL via INTRAVENOUS

## 2016-04-03 MED ORDER — SODIUM CHLORIDE 0.9% FLUSH
10.0000 mL | Freq: Once | INTRAVENOUS | Status: AC
  Administered 2016-04-03: 10 mL via INTRAVENOUS
  Filled 2016-04-03: qty 10

## 2016-04-03 MED ORDER — HEPARIN SOD (PORK) LOCK FLUSH 100 UNIT/ML IV SOLN
500.0000 [IU] | Freq: Once | INTRAVENOUS | Status: AC
  Administered 2016-04-03: 500 [IU] via INTRAVENOUS
  Filled 2016-04-03: qty 5

## 2016-04-03 MED ORDER — DIPHENHYDRAMINE HCL 25 MG PO CAPS
ORAL_CAPSULE | ORAL | Status: AC
  Filled 2016-04-03: qty 1

## 2016-04-03 MED ORDER — ACETAMINOPHEN 325 MG PO TABS
ORAL_TABLET | ORAL | Status: AC
  Filled 2016-04-03: qty 2

## 2016-04-03 MED ORDER — DIPHENHYDRAMINE HCL 25 MG PO CAPS
25.0000 mg | ORAL_CAPSULE | Freq: Once | ORAL | Status: AC
  Administered 2016-04-03: 25 mg via ORAL

## 2016-04-03 MED ORDER — ACETAMINOPHEN 325 MG PO TABS
650.0000 mg | ORAL_TABLET | Freq: Once | ORAL | Status: AC
  Administered 2016-04-03: 650 mg via ORAL

## 2016-04-03 NOTE — Assessment & Plan Note (Signed)
The prior diagnosis of blood clot was over a year ago. We discussed extended duration of apixaban for secondary prevention. Due to possible GI bleed I plan to reduce a dose of apixaban in the future

## 2016-04-03 NOTE — Patient Instructions (Signed)
Blood Transfusion A blood transfusion is a procedure in which you are given blood through an IV tube. You may need this procedure because of:  Illness.  Surgery.  Injury.  The blood may come from someone else (a donor). You may also be able to donate blood for yourself (autologous blood donation). The blood given in a transfusion is made up of different types of cells. You may get:  Red blood cells. These carry oxygen to the cells in the body.  White blood cells. These help you fight infections.  Platelets. These help your blood to clot.  Plasma. This is the liquid part of your blood. It helps with fluid imbalances.  If you have a clotting disorder, you may also get other types of blood products. What happens before the procedure?  You will have a blood test to find out your blood type. The test also finds out what type of blood your body will accept and matches it to the donor type.  If you are going to have a planned surgery, you may be able to donate your own blood. This may be done in case you need a transfusion.  If you have had an allergic reaction to a transfusion in the past, you may be given medicine to help prevent a reaction. This medicine may be given to you by mouth or through an IV.  You will have your temperature, blood pressure, and pulse checked.  Follow instructions from your doctor about what you cannot eat or drink.  Ask your doctor about: ? Changing or stopping your regular medicines. This is important if you take diabetes medicines or blood thinners. ? Taking medicines such as aspirin and ibuprofen. These medicines can thin your blood. Do not take these medicines before your procedure if your doctor tells you not to. What happens during the procedure?  An IV tube will be put into one of your veins.  The bag of donated blood will be attached to your IV tube. Then, the blood will enter through your vein.  Your temperature, blood pressure, and pulse will be  checked regularly during the procedure. This is done to find early signs of a transfusion reaction.  If you have any signs or symptoms of a reaction, your transfusion will be stopped. You may also be given medicine.  When the transfusion is done, your IV tube will be taken out.  Pressure may be applied to the IV site for a few minutes.  A bandage (dressing) will be put on the IV site. The procedure may vary among doctors and hospitals. What happens after the procedure?  Your temperature, blood pressure, heart rate, breathing rate, and blood oxygen level will be checked often.  Your blood may be tested to see how you are responding to the transfusion.  You may be warmed with fluids or blankets. This is done to keep the temperature of your body normal. Summary  A blood transfusion is a procedure in which you are given blood through an IV tube.  The blood may come from someone else (a donor). You may also be able to donate blood for yourself.  If you have had an allergic reaction to a transfusion in the past, you may be given medicine to help prevent a reaction. This medicine may be given to you by mouth or through an IV tube.  Your temperature, blood pressure, heart rate, breathing rate, and blood oxygen level will be checked often.  Your blood may be tested to   see how you are responding to the transfusion. This information is not intended to replace advice given to you by your health care provider. Make sure you discuss any questions you have with your health care provider. Document Released: 03/16/2008 Document Revised: 08/12/2015 Document Reviewed: 08/12/2015 Elsevier Interactive Patient Education  2017 [employer]  

## 2016-04-03 NOTE — Assessment & Plan Note (Signed)
I review her recent CT scan of the head which show possible worsening lesion in the brain. Radiation oncologist has ordered a repeat MRI of the brain later the week She will continue dexamethasone for now

## 2016-04-03 NOTE — Assessment & Plan Note (Signed)
After extensive discussion in the last visit, she is taking Senokot on a regular basis and denies further constipation. We will continue aggressive laxative therapy while she is on pain medicine

## 2016-04-03 NOTE — Assessment & Plan Note (Signed)
We discussed some of the risks, benefits, and alternatives of blood transfusions. The patient is symptomatic from anemia and the hemoglobin level is critically low.  Some of the side-effects to be expected including risks of transfusion reactions, chills, infection, syndrome of volume overload and risk of hospitalization from various reasons and the patient is willing to proceed and went ahead to sign consent today. 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

## 2016-04-03 NOTE — Assessment & Plan Note (Signed)
She has severe weight loss, stable since she was started on high dose Megace and steroids I continue to recommend increase oral intake as tolerated

## 2016-04-03 NOTE — Telephone Encounter (Signed)
Appointments scheduled per 4.3.18 LOS. Patient given AVS report and calendars with future scheduled appointments.  °

## 2016-04-03 NOTE — Patient Instructions (Signed)
Implanted Port Home Guide An implanted port is a type of central line that is placed under the skin. Central lines are used to provide IV access when treatment or nutrition needs to be given through a person's veins. Implanted ports are used for long-term IV access. An implanted port may be placed because:  You need IV medicine that would be irritating to the small veins in your hands or arms.  You need long-term IV medicines, such as antibiotics.  You need IV nutrition for a long period.  You need frequent blood draws for lab tests.  You need dialysis.  Implanted ports are usually placed in the chest area, but they can also be placed in the upper arm, the abdomen, or the leg. An implanted port has two main parts:  Reservoir. The reservoir is round and will appear as a small, raised area under your skin. The reservoir is the part where a needle is inserted to give medicines or draw blood.  Catheter. The catheter is a thin, flexible tube that extends from the reservoir. The catheter is placed into a large vein. Medicine that is inserted into the reservoir goes into the catheter and then into the vein.  How will I care for my incision site? Do not get the incision site wet. Bathe or shower as directed by your health care provider. How is my port accessed? Special steps must be taken to access the port:  Before the port is accessed, a numbing cream can be placed on the skin. This helps numb the skin over the port site.  Your health care provider uses a sterile technique to access the port. ? Your health care provider must put on a mask and sterile gloves. ? The skin over your port is cleaned carefully with an antiseptic and allowed to dry. ? The port is gently pinched between sterile gloves, and a needle is inserted into the port.  Only "non-coring" port needles should be used to access the port. Once the port is accessed, a blood return should be checked. This helps ensure that the port  is in the vein and is not clogged.  If your port needs to remain accessed for a constant infusion, a clear (transparent) bandage will be placed over the needle site. The bandage and needle will need to be changed every week, or as directed by your health care provider.  Keep the bandage covering the needle clean and dry. Do not get it wet. Follow your health care provider's instructions on how to take a shower or bath while the port is accessed.  If your port does not need to stay accessed, no bandage is needed over the port.  What is flushing? Flushing helps keep the port from getting clogged. Follow your health care provider's instructions on how and when to flush the port. Ports are usually flushed with saline solution or a medicine called heparin. The need for flushing will depend on how the port is used.  If the port is used for intermittent medicines or blood draws, the port will need to be flushed: ? After medicines have been given. ? After blood has been drawn. ? As part of routine maintenance.  If a constant infusion is running, the port may not need to be flushed.  How long will my port stay implanted? The port can stay in for as long as your health care provider thinks it is needed. When it is time for the port to come out, surgery will be   done to remove it. The procedure is similar to the one performed when the port was put in. When should I seek immediate medical care? When you have an implanted port, you should seek immediate medical care if:  You notice a bad smell coming from the incision site.  You have swelling, redness, or drainage at the incision site.  You have more swelling or pain at the port site or the surrounding area.  You have a fever that is not controlled with medicine.  This information is not intended to replace advice given to you by your health care provider. Make sure you discuss any questions you have with your health care provider. Document  Released: 12/18/2004 Document Revised: 05/26/2015 Document Reviewed: 08/25/2012 Elsevier Interactive Patient Education  2017 [employer]  

## 2016-04-03 NOTE — Assessment & Plan Note (Addendum)
She has improved overall since she started taking medications as directed. There is no doubt the dexamethasone has also improve her overall symptoms. I recommend increasing the dose of Megace slowly with goal of 80 mg twice a day. I will see her again in 3 weeks for further supportive care

## 2016-04-03 NOTE — Progress Notes (Signed)
[name] OFFICE PROGRESS NOTE  Patient Care Team: No Pcp Per Patient as PCP - General (General Practice)  SUMMARY OF ONCOLOGIC HISTORY:   Endometrial cancer ([place])   02/02/2008 Imaging    Uterine fibroid disease with dominant 5.4 cm fundal fibroid. 5 cm complex cystic abnormality of the left ovary/adnexa requiring follow-up ultrasound.  Cystic neoplasm cannot be excluded.  Follow-up is recommended in 6 to 8 weeks. 4 cm complex cyst of the right ovary.  This should also be followed with the left ovarian abnormality      03/03/2008 Imaging    Three uterine fibroids.  Largest measures up to 6.4 cm. Fluid within the endometrial canal with endometrial lining thickness 9.9 mm. Septated fluid containing structure adjacent to the right ovary may represent hydrosalpinx.  Ovarian mass cannot be excluded.  Given this finding, persistent vaginal bleeding and previously described adnexal abnormalities, MR of the pelvis with attention to the uterus ovary is recommended.      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 RIGHT OVARY, EXCISION: - HIGH GRADE SURFACE EPITHELIAL CARCINOMA, 2.7 CM, CONFINED WITHIN RIGHT OVARY. - NO ANGIOLYMPHATIC INVASION IDENTIFIED. - MATURE CYSTIC TERATOMA. - FALLOPIAN TUBE: NO PATHOLOGIC ABNORMALITIES. - PLEASE SEE ONCOLOGY TEMPLATE  2. 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 - ANGIOLYMPHATIC INVASION PRESENT. - LEIOMYOMATA. - CERVIX: BENIGN SQUAMOUS MUCOSA AND ENDOCERVICAL MUCOSA, NO DYSPLASIA  OR MALIGNANCY IDENTIFIED.  LEFT OVARY: - HIGH GRADE SURFACE EPITHELIAL CARCINOMA, 5.5 CM, CAPSULE RUPTURED; NO ANGIOLYMPHATIC INVASION SEEN. - FALLOPIAN TUBAL TISSUE WITH PARATUBAL CYST. - PLEASE SEE ONCOLOGY TEMPLATE.  3. OMENTUM, RESECTION: NEGATIVE FOR MALIGNANCY.      03/18/2008 Pathology Results    PERITONEAL WASHING (THIN PREPARATION AND CELL BLOCK): POSITIVE FOR ADENOCARCINOMA      09/21/2008 PET scan    FDG avid ascending colonic intraluminal mass, worrisome for colonic neoplasm.  The adjacent right pericolic gutter soft tissue nodule does not demonstrate F D G avidity, possibly due to its small size, but remains worrisome due to its proximity to the intraluminal mass and history of ovarian cancer. No abnormal F D G uptake in the neck, chest, or abdomen. Mild asymmetric soft tissue prominence of the anterior right parapharyngeal soft tissues, amenable to direct visualization.       12/23/2008 Miscellaneous    The patient completed external beam radiation therapy and high-dose rate brachytherapy as part of management of her stage IC endometrioid adenocarcinoma.  The patient completed her radiation treatments on 12/23/2008       02/12/2015 Relapse/Recurrence    Patient reported right inguinal mass and LLE pain when she presented to ED on 02-12-15, all due to disease relapse      02/12/2015 Imaging    Status post hysterectomy. 8.5 x 7.1 cm necrotic right inguinal node, corresponding to the palpable abnormality, suspicious for nodal metastasis. Additional retroperitoneal/ left pelvic nodal metastases, as above. 6.1 cm low-density lesion in the left psoas muscle, favored to reflect tumor, less likely infection. Secondary involvement of the left L4 and L5 vertebral bodies. Associated acute deep venous thrombosis involving the  left external iliac vein and extending into the left lower extremity, likely secondary to extrinsic compression by pelvic tumor. Pulmonary nodules/metastases  at the lung bases, measuring up to 2.4 cm in the left lower lobe.      02/14/2015 Procedure    Aspirate and core biopsy performed of enlarged and necrotic right inguinal lymph node. Aspirated fluid was sent for cytologic analysis. Solid tissue was sent for histopathologic analysis.       02/14/2015 Pathology Results    Diagnosis Lymph node, needle/core biopsy, right inguinal METASTATIC CARCINOMA WITH PREDOMINANT COAGULATIVE NECROSIS CONSISTENT WITH GYN PRIMARY Microscopic Comment The biopsy consist of metastatic carcinoma with coagulative necrosis. The neoplasm stains positive for ck7 and ER, supporting these are GYN primary.      02/14/2015 Pathology Results    Diagnosis LYMPH NODE, FINE NEEDLE ASPIRATION, RIGHT INGUINAL(SPECIMEN 1 OF 1 COLLECTED 02/14/15): MALIGNANT CELLS CONSISTENT WITH METASTATIC ADENOCARCINOMA.      02/14/2015 Tumor Marker    Patient's tumor was tested for the following markers: CA125 Results of the tumor marker test revealed 642.9      02/24/2015 - 05/26/2015 Chemotherapy    She received a few cycles of carboplatin and Taxotere      03/14/2015 Tumor Marker    Patient's tumor was tested for the following markers: CA125 Results of the tumor marker test revealed 427.8      04/12/2015 Procedure    Successful ultrasound-guided aspiration of a total of approximately 180 cc of brown colored fluid from symptomatic necrotic right inguinal lymph node. Note, if the cystic component of this lymph node reoccurs in the come symptomatic, the patient may benefit from a surgical resection      04/21/2015 Tumor Marker    Patient's tumor was tested for the following markers: CA125 Results of the tumor marker test revealed 111.9      05/12/2015 Pathology Results    Debridement, right inguinal - ADENOCARCINOMA ASSOCIATED WITH HEMATOMA. - SEE MICROSCOPIC DESCRIPTION. Microscopic Comment Within the dermis and subcutaneous tissue there is a hemorrhagic cavity surrounded by  granulation tissue and abundant hemosiderin deposition consistent with hematoma. In the adjacent connective tissue there are cystic glands with papillary protrusions consistent with adenocarcinoma. The morphologic features are most consistent with the previous ovarian carcinoma from 2010 ([id number]). (JDP:gt, 05/13/15)       05/12/2015 Pathology Results    CYTOLOGY FLUID: RIGHT INGUINAL FLUID(SPECIMEN 1 OF 1 COLLECTED 05/12/15):Marland Kitchen ABUNDANT BLOOD AND RARE ATYPICAL CELLS SUSPICIOUS FOR CARCINOMA      05/12/2015 Surgery    Procedure: Incision and drainage of right inguinal fluid collection with capsulectomy of seroma cavity Right inguinal wound VAC placement (10 x 9 x 6 cm) Operative Findings: Liquefied thick maroon colored fluid without odor. Well-defined capsule around the fluid collection. After resection of capsule there was adequate tissue overlying the vessels, therefore mobilization of sartorius was not necessary.      05/23/2015 Tumor Marker    Patient's tumor was tested for the following markers: CA125 Results of the tumor marker test revealed 48      06/14/2015 Imaging    Significant interval decrease in size of bilateral pulmonary nodules and masses as well as retroperitoneal, pelvic and inguinal adenopathy. Mass within the left upper lobe may represent metastatic disease or primary pulmonary malignancy. Interval increase in size of large cystic mass within the left hilum. Interval sclerosis of the L4 and L5 vertebral body metastasis.       06/14/2015 Genetic Testing    Genetics testing negative  Breast Ovarian and gyn panel by [facility] 06-14-15.      07/07/2015 Tumor Marker    Patient's tumor was tested for the following markers: CA125 Results of the tumor marker test revealed 86.6      08/01/2015 PET scan    Interval progression, as described above. Dominant 7.8 cm left upper lobe mass abuts the mediastinum. Additional left upper lobe/ left lower lobe masses and thoracic  lymphadenopathy. Additional retroperitoneal/left pelvic nodal metastases. Postsurgical changes in the right inguinal region with mild residual hypermetabolism.      08/10/2015 Procedure    Successful placement of a right IJ approach Power Port with ultrasound and fluoroscopic guidance. The catheter is ready for use.      08/11/2015 Miscellaneous    She received 1 dose of gemzar      08/18/2015 Tumor Marker    Patient's tumor was tested for the following markers: CA125 Results of the tumor marker test revealed 171      08/19/2015 - 02/17/2016 Anti-estrogen oral therapy    She was placed on Letrozole, stopped due to progression of disease      08/24/2015 - 09/15/2015 Radiation Therapy    08/24/2015-09/15/2015: Left Lung, 37.5 Gy in 15 fractions by Dr. Sondra Come.       09/23/2015 Imaging    Decrease in some of the pulmonary metastatic lesions as described above. The mass lesion abutting the mediastinum on the left has decreased in size from the prior exam. New mild pericardial effusion. Increase in size in a pelvic lymphadenopathy in the iliac chain. The left psoas mass and periaortic lymph node as well as changes in the right inguinal region are relatively stable as described. No new focal abnormality is seen.      09/28/2015 Imaging    Cerebellar edema centrally on the left with suggestion of an underlying 3.5 cm left cerebellar mass. Mass effect on the fourth ventricle but no ventriculomegaly or loss of basilar cistern patency. 2. Cerebellar metastatic disease favored in this clinical setting. Follow-up Brain MRI without and with contrast would characterize further. 3. No other acute or metastatic intracranial abnormality identified.      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 Vasogenic edema with mild mass effect on the fourth ventricle, no obstructive hydrocephalus. Otherwise negative MRI of the head for age.       10/07/2015 Imaging    Known left  cerebellar metastasis with vasogenic edema. Subjectively less mass effect on the adjacent fourth ventricle, albeit minimal.      10/11/2015 - 10/11/2015 Radiation Therapy    10/07/15: PTV1 Lt Cerebellar 23 mm was treated to 18 Gy in one fraction by Dr. Tammi Klippel.      10/31/2015 Tumor Marker    Patient's tumor was tested for the following markers: CA125 Results of the tumor marker test revealed 82.3      12/12/2015 Tumor Marker    Patient's tumor was tested for the following markers: CA125 Results of the tumor marker test revealed 92.5      01/03/2016 Imaging    Exam detail is diminished secondary to motion artifact. This predominantly affects the segmental pulmonary arteries of the left lung. Within this limitation no evidence for acute pulmonary embolus noted. 2. Increase in volume of left pericardial. 3. Mixed response to therapy with respect to mediastinal adenopathy and pulmonary lesions. The dominant left upper lobe lung mass is decreased in size in the interval. There is a nodule within the  right lower lobe which is increased in size in the interval. 4. Pre-vascular lymph node is mildly decreased in size in the interval. Large necrotic cystic nodal mass within the AP window region is stable in the interval.      01/05/2016 Imaging    Interval increase in size of left cerebellar metastasis, now measuring 2.4 x 2.6 x 2.0 cm. Similar localized edema and mass effect on the adjacent fourth ventricle. Fourth ventricle remains patent without obstructive hydrocephalus. 2. Interval development of a second 1.3 x 1.3 x 1.1 cm metastatic lesion within the left periventricular white matter of the left frontal lobe. Mild localized edema without significant mass effect.       01/09/2016 Imaging    LV EF: 60% -   65%      01/26/2016 Tumor Marker    Patient's tumor was tested for the following markers: CA125 Results of the tumor marker test revealed 211.9      02/01/2016 - 02/01/2016 Radiation  Therapy    Left frontal 13 mm target was treated using 3 Dynamic Conformal Arcs to a prescription dose of 20 Gy.      02/17/2016 -  Anti-estrogen oral therapy    She started taking Megace      03/07/2016 Tumor Marker    Patient's tumor was tested for the following markers: CA125 Results of the tumor marker test revealed 304      03/14/2016 Imaging    Ct head: Increased size of a left frontal lobe metastatic lesion with increased edema within the surrounding white matter. Mild 3 mm shift to the right. 2. Difficult visualization of known left cerebellar mass due to the mass being more hypodense on today's study. There appears to be increased edema within the left cerebellum and right cerebellum and therefore suspect that the cerebellar lesion is also increased. There is mass effect on fourth ventricle and increased mass effect on the pons and midbrain. 3. Cannot rule out subtle edema within the right frontal lobe white matter.       INTERVAL HISTORY: Please see below for problem oriented charting. She returns with multiple family members She complained of weakness, dizziness and fatigue She had a trip to [place] last weekend The patient denies any recent signs or symptoms of bleeding such as spontaneous epistaxis, hematuria or hematochezia. She denies recent headache, new neurological deficit or seizures Her constipation is well controlled with laxatives There were no reported recent falls  REVIEW OF SYSTEMS:   Constitutional: Denies fevers, chills or abnormal weight loss Eyes: Denies blurriness of vision Ears, nose, mouth, throat, and face: Denies mucositis or sore throat Respiratory: Denies cough, dyspnea or wheezes Cardiovascular: Denies palpitation, chest discomfort or lower extremity swelling Gastrointestinal:  Denies nausea, heartburn or change in bowel habits Skin: Denies abnormal skin rashes Lymphatics: Denies new lymphadenopathy or easy bruising Behavioral/Psych: Mood  is stable, no new changes  All other systems were reviewed with the patient and are negative.  I have reviewed the past medical history, past surgical history, social history and family history with the patient and they are unchanged from previous note.  ALLERGIES:  is allergic to morphine and related and dilaudid [hydromorphone hcl].  MEDICATIONS:  Current Outpatient Prescriptions  Medication Sig Dispense Refill  . amLODipine (NORVASC) 5 MG tablet TAKE 1 TABLET BY MOUTH TWICE A DAY 60 tablet 6  . apixaban (ELIQUIS) 5 MG TABS tablet Take 1 tablet (5 mg total) by mouth 2 (two) times daily. 180 tablet 1  .  dexamethasone (DECADRON) 4 MG tablet Take 1 tablet (4 mg total) by mouth 2 (two) times daily with a meal. 60 tablet 0  . levETIRAcetam (KEPPRA) 500 MG tablet Take 1 tablet (500 mg total) by mouth 2 (two) times daily. 60 tablet 11  . lidocaine-prilocaine (EMLA) cream Apply to Porta-cath 1-2 hrs prior to access as directed. 30 g 1  . LORazepam (ATIVAN) 0.5 MG tablet Take 1 tablet (0.5 mg total) by mouth every 6 (six) hours as needed (nausea). (Patient taking differently: Take 0.5 mg by mouth every 6 (six) hours as needed (for nausea). ) 30 tablet 0  . megestrol (MEGACE) 40 MG tablet Take 1 tablet (40 mg total) by mouth 2 (two) times daily. 60 tablet 1  . morphine (MSIR) 30 MG tablet Take 1 tablet (30 mg total) by mouth every 6 (six) hours as needed for moderate pain or severe pain. 60 tablet 0  . Multiple Vitamins-Minerals (ADULT GUMMY) CHEW Chew 2 each by mouth daily.    . pantoprazole (PROTONIX) 40 MG tablet Take 1 tablet (40 mg total) by mouth daily. 30 tablet 11  . polyethylene glycol (MIRALAX / GLYCOLAX) packet Take 17 g by mouth daily.     Marland Kitchen PROAIR HFA 108 (90 Base) MCG/ACT inhaler Inhale 1-2 puffs into the lungs every 4 (four) hours as needed for wheezing or shortness of breath.   3  . senna (SENOKOT) 8.6 MG tablet Take 1 tablet by mouth daily.    . VOLTAREN 1 % GEL APPLY TO AFFECTED AREA  EVERY 8 TO 12 HOURS AS NEEDED FOR PAIN  4  . zolpidem (AMBIEN) 5 MG tablet Take 1 tablet (5 mg total) by mouth at bedtime as needed for sleep. 30 tablet 5  . benzonatate (TESSALON) 100 MG capsule TK ONE C PO  BID PRN for cough  0  . potassium chloride (K-DUR) 10 MEQ tablet Take 2 tablets twice a day or as directed. (Patient not taking: Reported on 02/17/2016) 120 tablet 0  . potassium chloride SA (K-DUR,KLOR-CON) 20 MEQ tablet Take 1 tablet (20 mEq total) by mouth 2 (two) times daily. 6 tablet 0  . prochlorperazine (COMPAZINE) 10 MG tablet Take 1 tablet (10 mg total) by mouth every 6 (six) hours as needed for nausea or vomiting. (Patient not taking: Reported on 02/17/2016) 20 tablet 0   No current facility-administered medications for this visit.     PHYSICAL EXAMINATION: ECOG PERFORMANCE STATUS: 2 - Symptomatic, <50% confined to bed  Vitals:   04/03/16 1130  BP: (!) 145/71  Pulse: 100  Resp: 17  Temp: 98.7 F (37.1 C)   Filed Weights   04/03/16 1130  Weight: 154 lb 3.2 oz (69.9 kg)    GENERAL:alert, no distress and comfortable SKIN: skin color, texture, turgor are normal, no rashes or significant lesions EYES: normal, Conjunctiva are pink and non-injected, sclera clear OROPHARYNX:no exudate, no erythema and lips, buccal mucosa, and tongue normal  NECK: supple, thyroid normal size, non-tender, without nodularity LYMPH:  no palpable lymphadenopathy in the cervical, axillary or inguinal LUNGS: clear to auscultation and percussion with normal breathing effort HEART: regular rate & rhythm and no murmurs and no lower extremity edema ABDOMEN:abdomen soft, non-tender and normal bowel sounds Musculoskeletal:no cyanosis of digits and no clubbing  NEURO: alert & oriented x 3 with fluent speech, no focal motor/sensory deficits. Noted mild muscle wasting  LABORATORY DATA:  I have reviewed the data as listed    Component Value Date/Time   NA 141  04/03/2016 1102   K 3.3 (L) 04/03/2016 1102    CL 103 03/14/2016 1219   CO2 22 04/03/2016 1102   GLUCOSE 98 04/03/2016 1102   BUN 18.0 04/03/2016 1102   CREATININE 0.7 04/03/2016 1102   CALCIUM 9.1 04/03/2016 1102   PROT 6.2 (L) 04/03/2016 1102   ALBUMIN 2.4 (L) 04/03/2016 1102   AST 8 04/03/2016 1102   ALT 11 04/03/2016 1102   ALKPHOS 71 04/03/2016 1102   BILITOT 0.32 04/03/2016 1102   GFRNONAA >60 03/14/2016 1219   GFRAA >60 03/14/2016 1219    No results found for: SPEP, UPEP  Lab Results  Component Value Date   WBC 13.8 (H) 04/03/2016   NEUTROABS 12.7 (H) 04/03/2016   HGB 8.2 (L) 04/03/2016   HCT 24.9 (L) 04/03/2016   MCV 73.9 (L) 04/03/2016   PLT 344 04/03/2016      Chemistry      Component Value Date/Time   NA 141 04/03/2016 1102   K 3.3 (L) 04/03/2016 1102   CL 103 03/14/2016 1219   CO2 22 04/03/2016 1102   BUN 18.0 04/03/2016 1102   CREATININE 0.7 04/03/2016 1102      Component Value Date/Time   CALCIUM 9.1 04/03/2016 1102   ALKPHOS 71 04/03/2016 1102   AST 8 04/03/2016 1102   ALT 11 04/03/2016 1102   BILITOT 0.32 04/03/2016 1102       RADIOGRAPHIC STUDIES: I have personally reviewed the radiological images as listed and agreed with the findings in the report. Ct Head Wo Contrast  Result Date: 03/14/2016 CLINICAL DATA:  History of endometrial cancer with, recurrent nausea and vomiting EXAM: CT HEAD WITHOUT CONTRAST TECHNIQUE: Contiguous axial images were obtained from the base of the skull through the vertex without intravenous contrast. COMPARISON:  01/05/2016, CT head 10/07/2015 FINDINGS: Brain: No hemorrhage is visualized. Increased size of a hypodense mass in the left frontal lobe measuring 1.9 x 1.7 cm, compared with 1.3 x 1.3 cm on prior MRI. Increased low attenuation surrounding the mass compatible with increased edema. New 3 mm midline shift to the right. Difficult to exclude subtle focus of low attenuation within the right frontal lobe white matter. Increased low-attenuation left basal  ganglia. Diffuse low attenuation within the left cerebellum with increased edema/ low attenuation now seen in the right cerebellum. Known mass lesion in the left cerebellum is difficult to measure as it is now more hypodense, suspect that it is increased in size given increased edema within the posterior fossa. Mild mass effect on fourth ventricle. Mass effect on the left posterior pons and midbrain. The lateral and third ventricles are slightly enlarged compared to prior. Vascular: No hyperdense vessels. Vertebral artery calcifications. Scattered calcifications at the carotid siphons. Skull: Left parietal sclerotic bone protuberance unchanged. No fracture. Sinuses/Orbits: No acute finding. Other: None IMPRESSION: 1. Increased size of a left frontal lobe metastatic lesion with increased edema within the surrounding white matter. Mild 3 mm shift to the right. 2. Difficult visualization of known left cerebellar mass due to the mass being more hypodense on today's study. There appears to be increased edema within the left cerebellum and right cerebellum and therefore suspect that the cerebellar lesion is also increased. There is mass effect on fourth ventricle and increased mass effect on the pons and midbrain. 3. Cannot rule out subtle edema within the right frontal lobe white matter. Electronically Signed   By: Donavan Foil M.D.   On: 03/14/2016 18:51    ASSESSMENT & PLAN:  Endometrial cancer [facility]) She has improved overall since she started taking medications as directed. There is no doubt the dexamethasone has also improve her overall symptoms. I recommend increasing the dose of Megace slowly with goal of 80 mg twice a day. I will see her again in 3 weeks for further supportive care  Metastasis to brain [facility]) I review her recent CT scan of the head which show possible worsening lesion in the brain. Radiation oncologist has ordered a repeat MRI of the brain later the week She will continue dexamethasone  for now  Malignant neoplasm metastatic to lung [facility]) She is not symptomatic from lung metastasis She complain of fatigue likely due to anemia  Anemia in neoplastic disease We discussed some of the risks, benefits, and alternatives of blood transfusions. The patient is symptomatic from anemia and the hemoglobin level is critically low.  Some of the side-effects to be expected including risks of transfusion reactions, chills, infection, syndrome of volume overload and risk of hospitalization from various reasons and the patient is willing to proceed and went ahead to sign consent today. The cause of anemia is likely due to her active cancer and possibly slow bleeding We will give her 1 unit of blood and give her intravenous iron treatment next week The patient could not tolerate oral iron supplement  Leg DVT (deep venous thromboembolism), chronic, left ([place]) The prior diagnosis of blood clot was over a year ago. We discussed extended duration of apixaban for secondary prevention. Due to possible GI bleed I plan to reduce a dose of apixaban in the future  Weight loss She has severe weight loss, stable since she was started on high dose Megace and steroids I continue to recommend increase oral intake as tolerated  Constipation due to pain medication After extensive discussion in the last visit, she is taking Senokot on a regular basis and denies further constipation. We will continue aggressive laxative therapy while she is on pain medicine   Orders Placed This Encounter  Procedures  . Hold Tube, Blood Bank    Standing Status:   Standing    Number of Occurrences:   9    Standing Expiration Date:   04/03/2017   All questions were answered. The patient knows to call the clinic with any problems, questions or concerns. No barriers to learning was detected. I spent 25 minutes counseling the patient face to face. The total time spent in the appointment was 40 minutes and more than 50% was on  counseling and review of test results     Heath Lark, MD 04/03/2016 12:07 PM

## 2016-04-03 NOTE — Assessment & Plan Note (Signed)
She is not symptomatic from lung metastasis She complain of fatigue likely due to anemia

## 2016-04-04 LAB — TYPE AND SCREEN
ABO/RH(D): O POS
ANTIBODY SCREEN: NEGATIVE
Unit division: 0

## 2016-04-04 LAB — BPAM RBC
BLOOD PRODUCT EXPIRATION DATE: 201804232359
ISSUE DATE / TIME: 201804031345
UNIT TYPE AND RH: 5100

## 2016-04-04 LAB — CA 125: CANCER ANTIGEN (CA) 125: 303.3 U/mL — AB (ref 0.0–38.1)

## 2016-04-06 ENCOUNTER — Ambulatory Visit (HOSPITAL_COMMUNITY)
Admission: RE | Admit: 2016-04-06 | Discharge: 2016-04-06 | Disposition: A | Discharge status: Home / Self Care | Payer: Self-pay | Source: Ambulatory Visit | Attending: Radiation Oncology | Admitting: Radiation Oncology

## 2016-04-06 DIAGNOSIS — C7931 Secondary malignant neoplasm of brain: Secondary | ICD-10-CM | POA: Insufficient documentation

## 2016-04-06 DIAGNOSIS — C7949 Secondary malignant neoplasm of other parts of nervous system: Secondary | ICD-10-CM | POA: Insufficient documentation

## 2016-04-06 MED ORDER — GADOBENATE DIMEGLUMINE 529 MG/ML IV SOLN
15.0000 mL | Freq: Once | INTRAVENOUS | Status: AC
  Administered 2016-04-06: 15 mL via INTRAVENOUS

## 2016-04-06 NOTE — Progress Notes (Addendum)
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  Headache-frequency,location: None Fatigue: Having fatigue all during the day. Light headiness/Dizziness: Reports feeling dizzy, encouraged to sit on sit of the bed or chair before getting up to move about in her house.   Nausea/vomiting Ataxia(unsteady gait): None Vision (Blurred/double vision,blind spots, and peripheral vsion changes): None Fine motor movement-picking up objects with fingers,holding objects, writing, weakness of lower extremities: None Cognitive changes:Have difficulty with word finding at times and short and long term memory. Respiratory issus:SOB with exertion, dry coughing episodes two days, occasional wheezing  04-03-16 Saw Dr. Alvy Bimler Blood transfusion 04-04-16 anemia is likely due to her active cancer and possibly slow bleeding. We will give her 1 unit of blood and give her intravenous iron treatment next week 04-10-16 The patient could not tolerate oral iron supplement Imaging:04-06-16 MRI brain w wo contrast, 03-14-16 CT Head wo contrast Lab: 04-03-16 CBC w diff, Cmet,CA 125 Reports having burning on urination that started this morning painful 10/10 in the vaginal area, having labs urine specimen  included today per Dr. Alvy Bimler. Wt Readings from Last 3 Encounters:  04/09/16 146 lb (66.2 kg)  04/03/16 154 lb 3.2 oz (69.9 kg)  03/21/16 156 lb 3.2 oz (70.9 kg)  BP (!) 152/80   Pulse (!) 114   Temp 99.2 F (37.3 C) (Oral)   Resp 16   Ht 5\' 2"  (1.575 m)   Wt 146 lb (66.2 kg)   SpO2 100%   BMI 26.70 kg/m

## 2016-04-09 ENCOUNTER — Ambulatory Visit: Payer: Self-pay

## 2016-04-09 ENCOUNTER — Ambulatory Visit (HOSPITAL_BASED_OUTPATIENT_CLINIC_OR_DEPARTMENT_OTHER): Payer: Self-pay

## 2016-04-09 ENCOUNTER — Telehealth: Payer: Self-pay | Admitting: Medical Oncology

## 2016-04-09 ENCOUNTER — Encounter: Payer: Self-pay | Admitting: Radiation Oncology

## 2016-04-09 ENCOUNTER — Other Ambulatory Visit: Payer: Self-pay | Admitting: Radiation Oncology

## 2016-04-09 ENCOUNTER — Ambulatory Visit
Admission: RE | Admit: 2016-04-09 | Discharge: 2016-04-09 | Disposition: A | Discharge status: Home / Self Care | Payer: Self-pay | Source: Ambulatory Visit | Attending: Radiation Oncology | Admitting: Radiation Oncology

## 2016-04-09 ENCOUNTER — Other Ambulatory Visit: Payer: Self-pay | Admitting: Hematology and Oncology

## 2016-04-09 ENCOUNTER — Other Ambulatory Visit: Payer: Self-pay | Admitting: Medical Oncology

## 2016-04-09 VITALS — BP 152/80 | HR 114 | Temp 99.2°F | Resp 16 | Ht 62.0 in | Wt 146.0 lb

## 2016-04-09 DIAGNOSIS — D509 Iron deficiency anemia, unspecified: Secondary | ICD-10-CM | POA: Insufficient documentation

## 2016-04-09 DIAGNOSIS — C541 Malignant neoplasm of endometrium: Secondary | ICD-10-CM

## 2016-04-09 DIAGNOSIS — G936 Cerebral edema: Secondary | ICD-10-CM | POA: Insufficient documentation

## 2016-04-09 DIAGNOSIS — C7931 Secondary malignant neoplasm of brain: Secondary | ICD-10-CM | POA: Insufficient documentation

## 2016-04-09 DIAGNOSIS — R300 Dysuria: Secondary | ICD-10-CM | POA: Insufficient documentation

## 2016-04-09 DIAGNOSIS — C55 Malignant neoplasm of uterus, part unspecified: Secondary | ICD-10-CM | POA: Insufficient documentation

## 2016-04-09 LAB — URINALYSIS, MICROSCOPIC - CHCC
Bilirubin (Urine): NEGATIVE
Glucose: NEGATIVE mg/dL
KETONES: NEGATIVE mg/dL
NITRITE: NEGATIVE
PROTEIN: 30 mg/dL
Specific Gravity, Urine: 1.015 (ref 1.003–1.035)
UROBILINOGEN UR: 0.2 mg/dL (ref 0.2–1)
pH: 6.5 (ref 4.6–8.0)

## 2016-04-09 NOTE — Telephone Encounter (Signed)
LM to come by lab for urine collection after seeing RT.

## 2016-04-09 NOTE — Progress Notes (Signed)
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 Tammi Klippel.  10/07/2015 SRS Treatment: PTV1 Lt Cerebellar 23 mm was treated to 18 Gy in one fraction by Dr. Tammi Klippel.  08/24/2015-09/15/2015: Left Lung, 37.5 Gy in 15 fractions by Dr. Sondra Come.  2010: The patient completed external beam radiation therapy and high-dose rate brachytherapy as part of management of her stage IC endometrioid adenocarcinoma.  The patient completed her radiation treatments on 12/23/2008 by Dr. Sondra Come.  Narrative:  The patient returns today for routine follow-up since completing radiotherapy. Her history includes recurrent metastatic uterine cancer and she continues to be followed by Dr. Alvy Bimler. She is currently receiving megace. She tolerated her most recent radiotherapy. Her first post treatment MRI from 04/06/16 revealed concerns with the previously treated site now measuring 24 x 26 x 22 mm in the left cerebellum. The left frontal lesion measured 14 x 16 x 17 mm. Minimally increased vasogenic edema was also noted without new lesions present.          On review of systems, the patient reports that she is doing well overall. She denies any chest pain, shortness of breath, cough, fevers, chills, night sweats, unintended weight changes. She complaints of pain with urination and is going to have a UA with culture performed today. She denies any bowel disturbances, and denies abdominal pain, nausea or vomiting. She has been having trouble however with word finding at time and reports that her short term memory. She reports no  visual changes, or changes in her auditory acuity. She has no trouble with fine motor skills. She continues on dexamethasone though not for her brain disease, rather for appetite. She is going to receive IV iron next week due to anemia. She denies any new musculoskeletal or joint aches or pains, new skin lesions or concerns. A complete review of systems is obtained and is otherwise negative.  Past Medical History:  Past Medical History:  Diagnosis Date  . Chemotherapy-induced neuropathy ([place])   . DVT, lower extremity ([place])    left  02-12-2015  currently on Eliqius  . Endometrial cancer [facility]) oncologist-  dr Marko Plume   02-2015  . 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 Family history of breast cancer   . History of adenomatous polyp of colon    tubulovillious adenoma 09/ 2010  . History of ovarian cyst    complex  . History of radiation therapy 10/21/08-11/23/08 & 12/07/10/13/12/23 2010   ENDOMETRIOID   . History of uterine fibroid   . Hypertension   . Inguinal fluid collection 02/2015   right-drained x2  . Iron deficiency anemia    chronic severe  . Metastasis to lung ([place])   . 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 ABDOMINAL HYSTERECTOMY  2010  . APPLICATION OF WOUND VAC Right 05/12/2015   Procedure: APPLICATION OF WOUND VAC;  Surgeon: Hall Busing, MD;  Location: [facility];  Service: General;  Laterality: Right;  .  COLONOSCOPY W/ POLYPECTOMY  09-27-2008  . EXPLORATORY LAPAROTOMY /  TOTAL ABDOMINAL HYSTERECTOMY/ BILATERAL SALPINOOPHORECTOMY/  PARTIAL OMENTECTOMY  03-18-2008  . INGUINAL HERNIA REPAIR Right 05/12/2015   Procedure: INCISION AND DRAINGE RIGHT INGUINAL FLUID COLLECTION ;  Surgeon: Hall Busing, MD;  Location[facility];  Service: General;  Laterality: Right;  . IR GENERIC HISTORICAL  08/10/2015   IR US GUIDE VASC ACCESS RIGHT 08/10/2015 Jacqulynn Cadet,  MD [facility]-INTERV RAD  . IR GENERIC HISTORICAL  08/10/2015   IR FLUORO GUIDE CV LINE RIGHT 08/10/2015 Jacqulynn Cadet, MD [facility]INTER[facility]  . porta cath  06/2010   removal  . TUBAL LIGATION  1990's  . UMBILICAL HERNIA REPAIR     infant    Social History:  Social History   Social History  . Marital status: Married    Spouse name: N/A  . Number of children: 3  . Years of education: N/A   Occupational History  . Not on file.   Social History Main Topics  . Smoking status: Never Smoker  . Smokeless tobacco: Never Used  . Alcohol use No  . Drug use: No  . Sexual activity: No   Other Topics Concern  . Not on file   Social History Narrative  . No narrative on file    Family History: Family History  Problem Relation Age of Onset  . Bone cancer Paternal Grandmother     OSSEOUS METASTASIS  . Heart disease Father   . Heart disease Sister   . Bone cancer Maternal Grandmother 75  . Leukemia Maternal Grandfather 26  . Liver cancer Maternal Uncle   . Breast cancer Cousin 19    maternal first cousin     ALLERGIES:  is allergic to morphine and related and dilaudid [hydromorphone hcl].  Meds: Current Outpatient Prescriptions  Medication Sig Dispense Refill  . amLODipine (NORVASC) 5 MG tablet TAKE 1 TABLET BY MOUTH TWICE A DAY 60 tablet 6  . apixaban (ELIQUIS) 5 MG TABS tablet Take 1 tablet (5 mg total) by mouth 2 (two) times daily. 180 tablet 1  . benzonatate (TESSALON) 100 MG capsule TK ONE C PO  BID PRN for cough  0  . dexamethasone (DECADRON) 4 MG tablet Take 1 tablet (4 mg total) by mouth 2 (two) times daily with a meal. 60 tablet 0  . levETIRAcetam (KEPPRA) 500 MG tablet Take 1 tablet (500 mg total) by mouth 2 (two) times daily. 60 tablet 11  . lidocaine-prilocaine (EMLA) cream Apply to Porta-cath 1-2 hrs prior to access as directed. 30 g 1  . LORazepam (ATIVAN) 0.5 MG tablet Take 1 tablet (0.5 mg total) by mouth every 6 (six) hours as needed (nausea). (Patient taking  differently: Take 0.5 mg by mouth every 6 (six) hours as needed (for nausea). ) 30 tablet 0  . megestrol (MEGACE) 40 MG tablet Take 1 tablet (40 mg total) by mouth 2 (two) times daily. 60 tablet 1  . morphine (MSIR) 30 MG tablet Take 1 tablet (30 mg total) by mouth every 6 (six) hours as needed for moderate pain or severe pain. 60 tablet 0  . Multiple Vitamins-Minerals (ADULT GUMMY) CHEW Chew 2 each by mouth daily.    . pantoprazole (PROTONIX) 40 MG tablet Take 1 tablet (40 mg total) by mouth daily. 30 tablet 11  . polyethylene glycol (MIRALAX / GLYCOLAX) packet Take 17 g by mouth daily.     . potassium chloride (K-DUR) 10 MEQ tablet Take 2 tablets twice  a day or as directed. 120 tablet 0  . PROAIR HFA 108 (90 Base) MCG/ACT inhaler Inhale 1-2 puffs into the lungs every 4 (four) hours as needed for wheezing or shortness of breath.   3  . senna (SENOKOT) 8.6 MG tablet Take 1 tablet by mouth daily.    . VOLTAREN 1 % GEL APPLY TO AFFECTED AREA EVERY 8 TO 12 HOURS AS NEEDED FOR PAIN  4  . zolpidem (AMBIEN) 5 MG tablet Take 1 tablet (5 mg total) by mouth at bedtime as needed for sleep. 30 tablet 5  . potassium chloride SA (K-DUR,KLOR-CON) 20 MEQ tablet Take 1 tablet (20 mEq total) by mouth 2 (two) times daily. 6 tablet 0  . prochlorperazine (COMPAZINE) 10 MG tablet Take 1 tablet (10 mg total) by mouth every 6 (six) hours as needed for nausea or vomiting. (Patient not taking: Reported on 02/17/2016) 20 tablet 0   No current facility-administered medications for this encounter.     Physical Findings:  height is 5\' 2"  (1.575 m) and weight is 146 lb (66.2 kg). Her oral temperature is 99.2 F (37.3 C). Her blood pressure is 152/80 (abnormal) and her pulse is 114 (abnormal). Her respiration is 16 and oxygen saturation is 100%.  Pain Assessment Pain Score: 10-Worst pain ever (vagina)/10 In general this is a chronically ill appearing african-american in no acute distress. She presents in wheelchair. She's  alert and oriented x4 and appropriate throughout the examination. Cardiopulmonary assessment is negative for acute distress and she exhibits normal effort. Lower extremities reveal pretibial pitting edema bilaterally.   Lab Findings: Lab Results  Component Value Date   WBC 13.8 (H) 04/03/2016   HGB 8.2 (L) 04/03/2016   HCT 24.9 (L) 04/03/2016   MCV 73.9 (L) 04/03/2016   PLT 344 04/03/2016     Radiographic Findings: Ct Head Wo Contrast  Result Date: 03/14/2016 CLINICAL DATA:  History of endometrial cancer with, recurrent nausea and vomiting EXAM: CT HEAD WITHOUT CONTRAST TECHNIQUE: Contiguous axial images were obtained from the base of the skull through the vertex without intravenous contrast. COMPARISON:  01/05/2016, CT head 10/07/2015 FINDINGS: Brain: No hemorrhage is visualized. Increased size of a hypodense mass in the left frontal lobe measuring 1.9 x 1.7 cm, compared with 1.3 x 1.3 cm on prior MRI. Increased low attenuation surrounding the mass compatible with increased edema. New 3 mm midline shift to the right. Difficult to exclude subtle focus of low attenuation within the right frontal lobe white matter. Increased low-attenuation left basal ganglia. Diffuse low attenuation within the left cerebellum with increased edema/ low attenuation now seen in the right cerebellum. Known mass lesion in the left cerebellum is difficult to measure as it is now more hypodense, suspect that it is increased in size given increased edema within the posterior fossa. Mild mass effect on fourth ventricle. Mass effect on the left posterior pons and midbrain. The lateral and third ventricles are slightly enlarged compared to prior. Vascular: No hyperdense vessels. Vertebral artery calcifications. Scattered calcifications at the carotid siphons. Skull: Left parietal sclerotic bone protuberance unchanged. No fracture. Sinuses/Orbits: No acute finding. Other: None IMPRESSION: 1. Increased size of a left frontal lobe  metastatic lesion with increased edema within the surrounding white matter. Mild 3 mm shift to the right. 2. Difficult visualization of known left cerebellar mass due to the mass being more hypodense on today's study. There appears to be increased edema within the left cerebellum and right cerebellum and therefore suspect that the cerebellar  lesion is also increased. There is mass effect on fourth ventricle and increased mass effect on the pons and midbrain. 3. Cannot rule out subtle edema within the right frontal lobe white matter. Electronically Signed   By: Donavan Foil M.D.   On: 03/14/2016 18:51   Mr Jeri Cos EV Contrast  Result Date: 04/06/2016 CLINICAL DATA:  Continued surveillance metastatic endometrial cancer. New symptoms are not reported. Patient is on Decadron. Patient received SRS to the LEFT cerebellum 10/07/2015. Patient received SRS to the LEFT frontal lesion 02/01/2016. EXAM: MRI HEAD WITHOUT AND WITH CONTRAST TECHNIQUE: Multiplanar, multiecho pulse sequences of the brain and surrounding structures were obtained without and with intravenous contrast. CONTRAST:  30mL MULTIHANCE GADOBENATE DIMEGLUMINE 529 MG/ML IV SOLN COMPARISON:  Most recent prior 01/05/2016. FINDINGS: Brain: There is worsening of both metastatic lesions previously identified. LEFT cerebellar metastasis now 24 x 26 x 22 mm. LEFT frontal metastasis, also worse, measures 14 x 16 x 17 mm. Both demonstrate increasing central necrosis. Central susceptibility of the cerebellar lesion is increased, felt to account for peripheral apparent restricted DWI. There is mild mass effect on the fourth ventricle. No obstructive hydrocephalus. No significant LEFT-to-RIGHT shift. Minimally increased LEFT cerebellar vasogenic edema, and moderately increased LEFT frontal vasogenic edema. No new enhancing lesions are seen. No change cerebral volume.  Prominent perivascular spaces. Vascular: Normal flow voids. Skull and upper cervical spine: Normal  marrow signal. Marked empty sella. Sinuses/Orbits: Negative. Other: None. IMPRESSION: Worsening of both metastatic lesions previously identified. See discussion above. Electronically Signed   By: Staci Righter M.D.   On: 04/06/2016 12:40    Impression/Plan: 1. 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 The patient will continue systemic therapy and supportive care per Dr. Alvy Bimler. Regarding her brain MRI, she will have a follow up visit with Dr. Vertell Limber this Wednesday to discuss if there is a role for surgical intervention. The concern is that the finding on MRI may represent radionecrosis versus disease. We will also start her on Vitamin E and Trental in the meantime if he agrees that this is likely radionecrosis. We will plan continued surveillance if he does not recommend surgery in 3 months time.  2. Dysuria. The patient's urine is being tested for a UTI. Antibiotic selection if she has an infection will be per Dr. Alvy Bimler. Carola Rhine, PAC

## 2016-04-09 NOTE — Telephone Encounter (Signed)
Lab request ordered.

## 2016-04-09 NOTE — Telephone Encounter (Signed)
Woke up this am with burning on urination x 3. States urine is yellow and she urinates a normal amount. Denies nausea, flank pain. Sees PCP for first visit on wed. "can I get something please?".

## 2016-04-09 NOTE — Telephone Encounter (Signed)
She has appt to see radiation oncologist today Tell to swing by the lab; add lab appt for urinalysis and urine culture only. Do not draw blood tests Will call her once I have urine test back

## 2016-04-10 ENCOUNTER — Ambulatory Visit (HOSPITAL_BASED_OUTPATIENT_CLINIC_OR_DEPARTMENT_OTHER): Payer: Self-pay

## 2016-04-10 ENCOUNTER — Other Ambulatory Visit: Payer: Self-pay | Admitting: Hematology and Oncology

## 2016-04-10 VITALS — BP 149/72 | HR 88 | Temp 98.1°F | Resp 16

## 2016-04-10 DIAGNOSIS — C541 Malignant neoplasm of endometrium: Secondary | ICD-10-CM

## 2016-04-10 DIAGNOSIS — D649 Anemia, unspecified: Secondary | ICD-10-CM

## 2016-04-10 MED ORDER — SODIUM CHLORIDE 0.9 % IV SOLN
510.0000 mg | Freq: Once | INTRAVENOUS | Status: AC
  Administered 2016-04-10: 510 mg via INTRAVENOUS
  Filled 2016-04-10: qty 17

## 2016-04-10 MED ORDER — HEPARIN SOD (PORK) LOCK FLUSH 100 UNIT/ML IV SOLN
500.0000 [IU] | Freq: Once | INTRAVENOUS | Status: AC
  Administered 2016-04-10: 500 [IU]
  Filled 2016-04-10: qty 5

## 2016-04-10 MED ORDER — SODIUM CHLORIDE 0.9% FLUSH
10.0000 mL | Freq: Once | INTRAVENOUS | Status: AC
  Administered 2016-04-10: 10 mL
  Filled 2016-04-10: qty 10

## 2016-04-10 NOTE — Patient Instructions (Signed)

## 2016-04-10 NOTE — Progress Notes (Signed)
Patient c/o pain "when I pee" (see flowsheets), UA  And culture noted to be collected 04/09/16. MD Alvy Bimler notified. Per MD Alvy Bimler medication for pain offered. Per MD Alvy Bimler still waiting on culture results. MD Homero Fellers to notify patient of culture results when ready. Patient denies wanting pain medication at this time. Patient verbalizes understanding that results are still pending and that she will receive further instructions from MD [facility]. Will continue to monitor.

## 2016-04-11 LAB — URINE CULTURE

## 2016-04-12 ENCOUNTER — Other Ambulatory Visit: Payer: Self-pay | Admitting: Family Medicine

## 2016-04-12 ENCOUNTER — Ambulatory Visit (INDEPENDENT_AMBULATORY_CARE_PROVIDER_SITE_OTHER): Payer: Self-pay | Admitting: Family Medicine

## 2016-04-12 DIAGNOSIS — I82502 Chronic embolism and thrombosis of unspecified deep veins of left lower extremity: Secondary | ICD-10-CM

## 2016-04-12 DIAGNOSIS — C541 Malignant neoplasm of endometrium: Secondary | ICD-10-CM

## 2016-04-12 MED ORDER — ZOLPIDEM TARTRATE 5 MG PO TABS: 5.0000 mg | ORAL_TABLET | Freq: Every evening | ORAL | 2 refills | Status: AC | PRN

## 2016-04-12 MED ORDER — LEVETIRACETAM 500 MG PO TABS: 500.0000 mg | ORAL_TABLET | Freq: Every day | ORAL | 11 refills | Status: DC

## 2016-04-12 NOTE — Patient Instructions (Signed)
It was nice meeting you today! You were seen in clinic to establish care with me as your PCP.  We discussed your past medical history, medications and surgical history.  I refilled your Ambien at this time and you should be able to pick it up at your pharmacy.  In addition, we discussed some important health maintenance issues which we can take care of at your follow up visits.   Be well,  Lovenia Kim, MD

## 2016-04-12 NOTE — Progress Notes (Signed)
   Subjective:   Patient ID: [name]    DOB: 1955-07-20, 61 y.o. female   MRN: [id number]  CC: new patient   HPI: [name] is a 61 y.o. female who presents to clinic today to establish care.   Has lived in [place] area all her life.  Family also lives here.  Did not have a PCP prior to this and had been seeing doctors in ER as needed.   Lives at home with husband and 2 daughters.  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   She is present today with daughter and aunt.    PMH:  Endometrial cancer in 2010, s/p hysterectomy.  Underwent radiation and chemo at that time.  Diagnosed again in Feb 2017, had mets to brain, (one in frontal region, one in base of cerebellum, and within chest cavity). Just got done with radiation couple months ago for brain mets.  Undergoing radiation for cerebellum and chest.   -history of leg clot (discovered in 2017 when re-diagnosed with cancer).  On Eliquis.   -Found infected node in L groin Feb 2017 as well.  Underwent surgery to remove swollen node March 2017. -HTN - on Amlodipine. -Seizures - Keppra for seizure prevention given hx of brain mets. Has never had a seizure.  Was tapered yesterday to once daily dosing from BID. -Colonoscopy about 8 years ago - found a polyp.  Due for another colonoscopy.    -Getting iron infusions.  Has one scheduled for this week.  Dr. Alvy Bimler rec taking a daily iron tablet.  Has been taking it.   Allergies: dilaudid causes itching.    ROS: Fevers, chills, nausea, vomiting, abdominal pain, shortness of breath, leg swelling.   Social: lives at home with husband, 2 daughters and grandkids.  Never smoker.  THC in college.  Does not drink alcohol.  Walker to ambulate long distances.  No decreases in strength or sensation.    Medications reviewed.  Objective:   BP (!) 142/72   Pulse (!) 110   Temp 98.2 F (36.8 C) (Oral)   Wt 155 lb 9.6 oz (70.6 kg)   SpO2 98%   BMI 28.46 kg/m  Vitals and nursing note  reviewed.  General: 61 yo F, in NAD HEENT: NCAT, MMM  Neck: supple CV: RRR no MRG  Lungs: CTA B/L with comfortable work of breathing Abdomen: soft, NTND, +bs Skin: warm, dry Extremities: no edema or tenderness   Assessment & Plan:   61 yo F presents to clinic to establish care with PCP.  Reviewed past medical history, medications and addressed important health maintenance issues. Patient here with family and no concerns at this time.   Meds ordered this encounter  Medications  . levETIRAcetam (KEPPRA) 500 MG tablet    Sig: Take 1 tablet (500 mg total) by mouth daily.    Dispense:  60 tablet    Refill:  11  . zolpidem (AMBIEN) 5 MG tablet    Sig: Take 1 tablet (5 mg total) by mouth at bedtime as needed for sleep.    Dispense:  30 tablet    Refill:  2   Follow up: 2 weeks  Lovenia Kim, MD [place], PGY-1 04/23/2016 9:01 AM

## 2016-04-13 ENCOUNTER — Encounter: Payer: Self-pay | Admitting: Pharmacy Technician

## 2016-04-13 ENCOUNTER — Other Ambulatory Visit: Payer: Self-pay | Admitting: *Deleted

## 2016-04-13 MED ORDER — METHADONE HCL 10 MG PO TABS: 10.0000 mg | ORAL_TABLET | Freq: Two times a day (BID) | ORAL | 0 refills | Status: AC

## 2016-04-13 NOTE — Telephone Encounter (Signed)
Pt states she is having to use more morphine for her leg pain, does not want to take morphine. Is requesting dilaudid for pain- Dr Alvy Bimler will not prescribe dilaudid as she is allergic to it.   Methadone 10 mg prescribed

## 2016-04-13 NOTE — Progress Notes (Signed)
Patient Drug Assistance approved with [employer] for Feraheme.

## 2016-04-13 NOTE — Progress Notes (Signed)
Entered in error

## 2016-04-17 ENCOUNTER — Ambulatory Visit (HOSPITAL_BASED_OUTPATIENT_CLINIC_OR_DEPARTMENT_OTHER): Payer: Self-pay

## 2016-04-17 VITALS — BP 142/76 | HR 111 | Temp 98.9°F | Resp 16

## 2016-04-17 DIAGNOSIS — D649 Anemia, unspecified: Secondary | ICD-10-CM

## 2016-04-17 DIAGNOSIS — C541 Malignant neoplasm of endometrium: Secondary | ICD-10-CM

## 2016-04-17 MED ORDER — SODIUM CHLORIDE 0.9% FLUSH
10.0000 mL | Freq: Once | INTRAVENOUS | Status: AC
  Administered 2016-04-17: 10 mL
  Filled 2016-04-17: qty 10

## 2016-04-17 MED ORDER — SODIUM CHLORIDE 0.9 % IV SOLN
510.0000 mg | Freq: Once | INTRAVENOUS | Status: AC
  Administered 2016-04-17: 510 mg via INTRAVENOUS
  Filled 2016-04-17: qty 17

## 2016-04-17 MED ORDER — HEPARIN SOD (PORK) LOCK FLUSH 100 UNIT/ML IV SOLN
500.0000 [IU] | Freq: Once | INTRAVENOUS | Status: AC
  Administered 2016-04-17: 500 [IU]
  Filled 2016-04-17: qty 5

## 2016-04-17 NOTE — Patient Instructions (Signed)

## 2016-04-24 ENCOUNTER — Ambulatory Visit (HOSPITAL_BASED_OUTPATIENT_CLINIC_OR_DEPARTMENT_OTHER): Payer: Self-pay | Admitting: Hematology and Oncology

## 2016-04-24 ENCOUNTER — Other Ambulatory Visit (HOSPITAL_BASED_OUTPATIENT_CLINIC_OR_DEPARTMENT_OTHER): Payer: Self-pay

## 2016-04-24 ENCOUNTER — Ambulatory Visit (HOSPITAL_BASED_OUTPATIENT_CLINIC_OR_DEPARTMENT_OTHER): Payer: Self-pay

## 2016-04-24 ENCOUNTER — Telehealth: Payer: Self-pay | Admitting: Hematology and Oncology

## 2016-04-24 ENCOUNTER — Encounter: Payer: Self-pay | Admitting: Hematology and Oncology

## 2016-04-24 VITALS — BP 149/81 | HR 124 | Temp 99.0°F | Resp 18 | Ht 62.0 in | Wt 156.1 lb

## 2016-04-24 DIAGNOSIS — C7931 Secondary malignant neoplasm of brain: Secondary | ICD-10-CM

## 2016-04-24 DIAGNOSIS — I82502 Chronic embolism and thrombosis of unspecified deep veins of left lower extremity: Secondary | ICD-10-CM

## 2016-04-24 DIAGNOSIS — C7800 Secondary malignant neoplasm of unspecified lung: Secondary | ICD-10-CM

## 2016-04-24 DIAGNOSIS — C541 Malignant neoplasm of endometrium: Secondary | ICD-10-CM

## 2016-04-24 DIAGNOSIS — D630 Anemia in neoplastic disease: Secondary | ICD-10-CM

## 2016-04-24 LAB — COMPREHENSIVE METABOLIC PANEL
ALBUMIN: 2.3 g/dL — AB (ref 3.5–5.0)
ALK PHOS: 88 U/L (ref 40–150)
ALT: 10 U/L (ref 0–55)
AST: 9 U/L (ref 5–34)
Anion Gap: 12 mEq/L — ABNORMAL HIGH (ref 3–11)
BILIRUBIN TOTAL: 0.31 mg/dL (ref 0.20–1.20)
BUN: 12.3 mg/dL (ref 7.0–26.0)
CALCIUM: 9.5 mg/dL (ref 8.4–10.4)
CO2: 20 mEq/L — ABNORMAL LOW (ref 22–29)
CREATININE: 0.7 mg/dL (ref 0.6–1.1)
Chloride: 110 mEq/L — ABNORMAL HIGH (ref 98–109)
Glucose: 103 mg/dl (ref 70–140)
POTASSIUM: 3.4 meq/L — AB (ref 3.5–5.1)
Sodium: 142 mEq/L (ref 136–145)
TOTAL PROTEIN: 6.4 g/dL (ref 6.4–8.3)

## 2016-04-24 LAB — CBC WITH DIFFERENTIAL/PLATELET
BASO%: 0.1 % (ref 0.0–2.0)
Basophils Absolute: 0 10e3/uL (ref 0.0–0.1)
EOS%: 0.2 % (ref 0.0–7.0)
Eosinophils Absolute: 0 10e3/uL (ref 0.0–0.5)
HCT: 26.6 % — ABNORMAL LOW (ref 34.8–46.6)
HGB: 8.7 g/dL — ABNORMAL LOW (ref 11.6–15.9)
LYMPH%: 4.7 % — ABNORMAL LOW (ref 14.0–49.7)
MCH: 24.9 pg — ABNORMAL LOW (ref 25.1–34.0)
MCHC: 32.7 g/dL (ref 31.5–36.0)
MCV: 76.2 fL — ABNORMAL LOW (ref 79.5–101.0)
MONO#: 1.4 10e3/uL — ABNORMAL HIGH (ref 0.1–0.9)
MONO%: 8.9 % (ref 0.0–14.0)
NEUT#: 14 10e3/uL — ABNORMAL HIGH (ref 1.5–6.5)
NEUT%: 86.1 % — ABNORMAL HIGH (ref 38.4–76.8)
Platelets: 334 10e3/uL (ref 145–400)
RBC: 3.49 10e6/uL — ABNORMAL LOW (ref 3.70–5.45)
RDW: 22.8 % — ABNORMAL HIGH (ref 11.2–14.5)
WBC: 16.2 10e3/uL — ABNORMAL HIGH (ref 3.9–10.3)
lymph#: 0.8 10e3/uL — ABNORMAL LOW (ref 0.9–3.3)

## 2016-04-24 MED ORDER — HEPARIN SOD (PORK) LOCK FLUSH 100 UNIT/ML IV SOLN
500.0000 [IU] | Freq: Once | INTRAVENOUS | Status: AC
  Administered 2016-04-24: 500 [IU]
  Filled 2016-04-24: qty 5

## 2016-04-24 MED ORDER — SODIUM CHLORIDE 0.9% FLUSH
10.0000 mL | Freq: Once | INTRAVENOUS | Status: AC
  Administered 2016-04-24: 10 mL
  Filled 2016-04-24: qty 10

## 2016-04-24 MED ORDER — HEPARIN SOD (PORK) LOCK FLUSH 100 UNIT/ML IV SOLN
500.0000 [IU] | Freq: Once | INTRAVENOUS | Status: AC
  Filled 2016-04-24: qty 5

## 2016-04-24 NOTE — Assessment & Plan Note (Signed)
I review her recent CT scan of the head which show possible worsening lesion in the brain. MRI show mild disease worsening She completed dexamethasone taper and have no symptoms of neurological deficit.

## 2016-04-24 NOTE — Assessment & Plan Note (Signed)
She has improved overall since she started taking medications as directed. She tolerated increased dose Megace well at 80 mg twice a day. I plan to repeat imaging study in 2 weeks when I see her back to assess response to treatment

## 2016-04-24 NOTE — Patient Instructions (Signed)

## 2016-04-24 NOTE — Assessment & Plan Note (Signed)
The prior diagnosis of blood clot was over a year ago. We discussed extended duration of apixaban for secondary prevention. Due to possible GI bleed, she is on reduced dose of apixaban

## 2016-04-24 NOTE — Progress Notes (Signed)
[facility] OFFICE PROGRESS NOTE  Patient Care Team: Lovenia Kim, MD as PCP - General  SUMMARY OF ONCOLOGIC HISTORY:   Endometrial cancer ([place])   02/02/2008 Imaging    Uterine fibroid disease with dominant 5.4 cm fundal fibroid. 5 cm complex cystic abnormality of the left ovary/adnexa requiring follow-up ultrasound.  Cystic neoplasm cannot be excluded.  Follow-up is recommended in 6 to 8 weeks. 4 cm complex cyst of the right ovary.  This should also be followed with the left ovarian abnormality      03/03/2008 Imaging    Three uterine fibroids.  Largest measures up to 6.4 cm. Fluid within the endometrial canal with endometrial lining thickness 9.9 mm. Septated fluid containing structure adjacent to the right ovary may represent hydrosalpinx.  Ovarian mass cannot be excluded.  Given this finding, persistent vaginal bleeding and previously described adnexal abnormalities, MR of the pelvis with attention to the uterus ovary is recommended.      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 RIGHT OVARY, EXCISION: - HIGH GRADE SURFACE EPITHELIAL CARCINOMA, 2.7 CM, CONFINED WITHIN RIGHT OVARY. - NO ANGIOLYMPHATIC INVASION IDENTIFIED. - MATURE CYSTIC TERATOMA. - FALLOPIAN TUBE: NO PATHOLOGIC ABNORMALITIES. - PLEASE SEE ONCOLOGY TEMPLATE  2. 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 - ANGIOLYMPHATIC INVASION PRESENT. - LEIOMYOMATA. - CERVIX: BENIGN SQUAMOUS MUCOSA AND ENDOCERVICAL MUCOSA, NO DYSPLASIA OR MALIGNANCY  IDENTIFIED.  LEFT OVARY: - HIGH GRADE SURFACE EPITHELIAL CARCINOMA, 5.5 CM, CAPSULE RUPTURED; NO ANGIOLYMPHATIC INVASION SEEN. - FALLOPIAN TUBAL TISSUE WITH PARATUBAL CYST. - PLEASE SEE ONCOLOGY TEMPLATE.  3. OMENTUM, RESECTION: NEGATIVE FOR MALIGNANCY.      03/18/2008 Pathology Results    PERITONEAL WASHING (THIN PREPARATION AND CELL BLOCK): POSITIVE FOR ADENOCARCINOMA      09/21/2008 PET scan    FDG avid ascending colonic intraluminal mass, worrisome for colonic neoplasm.  The adjacent right pericolic gutter soft tissue nodule does not demonstrate F D G avidity, possibly due to its small size, but remains worrisome due to its proximity to the intraluminal mass and history of ovarian cancer. No abnormal F D G uptake in the neck, chest, or abdomen. Mild asymmetric soft tissue prominence of the anterior right parapharyngeal soft tissues, amenable to direct visualization.       12/23/2008 Miscellaneous    The patient completed external beam radiation therapy and high-dose rate brachytherapy as part of management of her stage IC endometrioid adenocarcinoma.  The patient completed her radiation treatments on 12/23/2008       02/12/2015 Relapse/Recurrence    Patient reported right inguinal mass and LLE pain when she presented to ED on 02-12-15, all due to disease relapse      02/12/2015 Imaging    Status post hysterectomy. 8.5 x 7.1 cm necrotic right inguinal node, corresponding to the palpable abnormality, suspicious for nodal metastasis. Additional retroperitoneal/ left pelvic nodal metastases, as above. 6.1 cm low-density lesion in the left psoas muscle, favored to reflect tumor, less likely infection. Secondary involvement of the left L4 and L5 vertebral bodies. Associated acute deep venous thrombosis involving the left external iliac  vein and extending into the left lower extremity, likely secondary to extrinsic compression by pelvic tumor. Pulmonary nodules/metastases at the lung  bases, measuring up to 2.4 cm in the left lower lobe.      02/14/2015 Procedure    Aspirate and core biopsy performed of enlarged and necrotic right inguinal lymph node. Aspirated fluid was sent for cytologic analysis. Solid tissue was sent for histopathologic analysis.       02/14/2015 Pathology Results    Diagnosis Lymph node, needle/core biopsy, right inguinal METASTATIC CARCINOMA WITH PREDOMINANT COAGULATIVE NECROSIS CONSISTENT WITH GYN PRIMARY Microscopic Comment The biopsy consist of metastatic carcinoma with coagulative necrosis. The neoplasm stains positive for ck7 and ER, supporting these are GYN primary.      02/14/2015 Pathology Results    Diagnosis LYMPH NODE, FINE NEEDLE ASPIRATION, RIGHT INGUINAL(SPECIMEN 1 OF 1 COLLECTED 02/14/15): MALIGNANT CELLS CONSISTENT WITH METASTATIC ADENOCARCINOMA.      02/14/2015 Tumor Marker    Patient's tumor was tested for the following markers: CA125 Results of the tumor marker test revealed 642.9      02/24/2015 - 05/26/2015 Chemotherapy    She received a few cycles of carboplatin and Taxotere      03/14/2015 Tumor Marker    Patient's tumor was tested for the following markers: CA125 Results of the tumor marker test revealed 427.8      04/12/2015 Procedure    Successful ultrasound-guided aspiration of a total of approximately 180 cc of brown colored fluid from symptomatic necrotic right inguinal lymph node. Note, if the cystic component of this lymph node reoccurs in the come symptomatic, the patient may benefit from a surgical resection      04/21/2015 Tumor Marker    Patient's tumor was tested for the following markers: CA125 Results of the tumor marker test revealed 111.9      05/12/2015 Pathology Results    Debridement, right inguinal - ADENOCARCINOMA ASSOCIATED WITH HEMATOMA. - SEE MICROSCOPIC DESCRIPTION. Microscopic Comment Within the dermis and subcutaneous tissue there is a hemorrhagic cavity surrounded by granulation  tissue and abundant hemosiderin deposition consistent with hematoma. In the adjacent connective tissue there are cystic glands with papillary protrusions consistent with adenocarcinoma. The morphologic features are most consistent with the previous ovarian carcinoma from 2010 ([id number]). (JDP:gt, 05/13/15)       05/12/2015 Pathology Results    CYTOLOGY FLUID: RIGHT INGUINAL FLUID(SPECIMEN 1 OF 1 COLLECTED 05/12/15):Marland Kitchen ABUNDANT BLOOD AND RARE ATYPICAL CELLS SUSPICIOUS FOR CARCINOMA      05/12/2015 Surgery    Procedure: Incision and drainage of right inguinal fluid collection with capsulectomy of seroma cavity Right inguinal wound VAC placement (10 x 9 x 6 cm) Operative Findings: Liquefied thick maroon colored fluid without odor. Well-defined capsule around the fluid collection. After resection of capsule there was adequate tissue overlying the vessels, therefore mobilization of sartorius was not necessary.      05/23/2015 Tumor Marker    Patient's tumor was tested for the following markers: CA125 Results of the tumor marker test revealed 48      06/14/2015 Imaging    Significant interval decrease in size of bilateral pulmonary nodules and masses as well as retroperitoneal, pelvic and inguinal adenopathy. Mass within the left upper lobe may represent metastatic disease or primary pulmonary malignancy. Interval increase in size of large cystic mass within the left hilum. Interval sclerosis of the L4 and L5 vertebral body metastasis.       06/14/2015 Genetic Testing    Genetics testing negative Breast Ovarian and  gyn panel by [facility] 06-14-15.      07/07/2015 Tumor Marker    Patient's tumor was tested for the following markers: CA125 Results of the tumor marker test revealed 86.6      08/01/2015 PET scan    Interval progression, as described above. Dominant 7.8 cm left upper lobe mass abuts the mediastinum. Additional left upper lobe/ left lower lobe masses and thoracic lymphadenopathy.  Additional retroperitoneal/left pelvic nodal metastases. Postsurgical changes in the right inguinal region with mild residual hypermetabolism.      08/10/2015 Procedure    Successful placement of a right IJ approach Power Port with ultrasound and fluoroscopic guidance. The catheter is ready for use.      08/11/2015 Miscellaneous    She received 1 dose of gemzar      08/18/2015 Tumor Marker    Patient's tumor was tested for the following markers: CA125 Results of the tumor marker test revealed 171      08/19/2015 - 02/17/2016 Anti-estrogen oral therapy    She was placed on Letrozole, stopped due to progression of disease      08/24/2015 - 09/15/2015 Radiation Therapy    08/24/2015-09/15/2015: Left Lung, 37.5 Gy in 15 fractions by Dr. Sondra Come.       09/23/2015 Imaging    Decrease in some of the pulmonary metastatic lesions as described above. The mass lesion abutting the mediastinum on the left has decreased in size from the prior exam. New mild pericardial effusion. Increase in size in a pelvic lymphadenopathy in the iliac chain. The left psoas mass and periaortic lymph node as well as changes in the right inguinal region are relatively stable as described. No new focal abnormality is seen.      09/28/2015 Imaging    Cerebellar edema centrally on the left with suggestion of an underlying 3.5 cm left cerebellar mass. Mass effect on the fourth ventricle but no ventriculomegaly or loss of basilar cistern patency. 2. Cerebellar metastatic disease favored in this clinical setting. Follow-up Brain MRI without and with contrast would characterize further. 3. No other acute or metastatic intracranial abnormality identified.      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 Vasogenic edema with mild mass effect on the fourth ventricle, no obstructive hydrocephalus. Otherwise negative MRI of the head for age.       10/07/2015 Imaging    Known left cerebellar metastasis  with vasogenic edema. Subjectively less mass effect on the adjacent fourth ventricle, albeit minimal.      10/11/2015 - 10/11/2015 Radiation Therapy    10/07/15: PTV1 Lt Cerebellar 23 mm was treated to 18 Gy in one fraction by Dr. Tammi Klippel.      10/31/2015 Tumor Marker    Patient's tumor was tested for the following markers: CA125 Results of the tumor marker test revealed 82.3      12/12/2015 Tumor Marker    Patient's tumor was tested for the following markers: CA125 Results of the tumor marker test revealed 92.5      01/03/2016 Imaging    Exam detail is diminished secondary to motion artifact. This predominantly affects the segmental pulmonary arteries of the left lung. Within this limitation no evidence for acute pulmonary embolus noted. 2. Increase in volume of left pericardial. 3. Mixed response to therapy with respect to mediastinal adenopathy and pulmonary lesions. The dominant left upper lobe lung mass is decreased in size in the interval. There is a nodule within the right lower lobe  which is increased in size in the interval. 4. Pre-vascular lymph node is mildly decreased in size in the interval. Large necrotic cystic nodal mass within the AP window region is stable in the interval.      01/05/2016 Imaging    Interval increase in size of left cerebellar metastasis, now measuring 2.4 x 2.6 x 2.0 cm. Similar localized edema and mass effect on the adjacent fourth ventricle. Fourth ventricle remains patent without obstructive hydrocephalus. 2. Interval development of a second 1.3 x 1.3 x 1.1 cm metastatic lesion within the left periventricular white matter of the left frontal lobe. Mild localized edema without significant mass effect.       01/09/2016 Imaging    LV EF: 60% -   65%      01/26/2016 Tumor Marker    Patient's tumor was tested for the following markers: CA125 Results of the tumor marker test revealed 211.9      02/01/2016 - 02/01/2016 Radiation Therapy    Left frontal 13  mm target was treated using 3 Dynamic Conformal Arcs to a prescription dose of 20 Gy.      02/17/2016 -  Anti-estrogen oral therapy    She started taking Megace      03/07/2016 Tumor Marker    Patient's tumor was tested for the following markers: CA125 Results of the tumor marker test revealed 304      03/14/2016 Imaging    Ct head: Increased size of a left frontal lobe metastatic lesion with increased edema within the surrounding white matter. Mild 3 mm shift to the right. 2. Difficult visualization of known left cerebellar mass due to the mass being more hypodense on today's study. There appears to be increased edema within the left cerebellum and right cerebellum and therefore suspect that the cerebellar lesion is also increased. There is mass effect on fourth ventricle and increased mass effect on the pons and midbrain. 3. Cannot rule out subtle edema within the right frontal lobe white matter.      04/03/2016 Tumor Marker    Patient's tumor was tested for the following markers: CA125 Results of the tumor marker test revealed 303      04/06/2016 Imaging    Worsening of both metastatic lesions previously identified.        INTERVAL HISTORY: Please see below for problem oriented charting. She returns for further follow-up She denies chest pain or shortness of breath The patient denies any recent signs or symptoms of bleeding such as spontaneous epistaxis, hematuria or hematochezia. She has completed recent dexamethasone taper on 04/18/2016.  Denies nausea, headache or new neurological deficits Her appetite is stable.  She denies significant abdominal pain.  Denies recent constipation. REVIEW OF SYSTEMS:   Constitutional: Denies fevers, chills or abnormal weight loss Eyes: Denies blurriness of vision Ears, nose, mouth, throat, and face: Denies mucositis or sore throat Respiratory: Denies cough, dyspnea or wheezes Cardiovascular: Denies palpitation, chest discomfort or lower extremity  swelling Gastrointestinal:  Denies nausea, heartburn or change in bowel habits Skin: Denies abnormal skin rashes Lymphatics: Denies new lymphadenopathy or easy bruising Neurological:Denies numbness, tingling or new weaknesses Behavioral/Psych: Mood is stable, no new changes  All other systems were reviewed with the patient and are negative.  I have reviewed the past medical history, past surgical history, social history and family history with the patient and they are unchanged from previous note.  ALLERGIES:  is allergic to morphine and related and dilaudid [hydromorphone hcl].  MEDICATIONS:  Current Outpatient Prescriptions  Medication Sig Dispense Refill  . amLODipine (NORVASC) 5 MG tablet TAKE 1 TABLET BY MOUTH TWICE A DAY 60 tablet 6  . apixaban (ELIQUIS) 5 MG TABS tablet Take 1 tablet (5 mg total) by mouth 2 (two) times daily. 180 tablet 1  . benzonatate (TESSALON) 100 MG capsule TK ONE C PO  BID PRN for cough  0  . lidocaine-prilocaine (EMLA) cream Apply to Porta-cath 1-2 hrs prior to access as directed. 30 g 1  . LORazepam (ATIVAN) 0.5 MG tablet Take 1 tablet (0.5 mg total) by mouth every 6 (six) hours as needed (nausea). (Patient taking differently: Take 0.5 mg by mouth every 6 (six) hours as needed (for nausea). ) 30 tablet 0  . megestrol (MEGACE) 40 MG tablet TAKE 1 TABLET BY MOUTH TWICE A DAY 60 tablet 1  . methadone (DOLOPHINE) 10 MG tablet Take 1 tablet (10 mg total) by mouth every 12 (twelve) hours. 60 tablet 0  . Multiple Vitamins-Minerals (ADULT GUMMY) CHEW Chew 2 each by mouth daily.    . pantoprazole (PROTONIX) 40 MG tablet Take 1 tablet (40 mg total) by mouth daily. 30 tablet 11  . polyethylene glycol (MIRALAX / GLYCOLAX) packet Take 17 g by mouth daily.     Marland Kitchen PROAIR HFA 108 (90 Base) MCG/ACT inhaler Inhale 1-2 puffs into the lungs every 4 (four) hours as needed for wheezing or shortness of breath.   3  . senna (SENOKOT) 8.6 MG tablet Take 1 tablet by mouth daily.    .  VOLTAREN 1 % GEL APPLY TO AFFECTED AREA EVERY 8 TO 12 HOURS AS NEEDED FOR PAIN  4  . dexamethasone (DECADRON) 4 MG tablet Take 1 tablet (4 mg total) by mouth 2 (two) times daily with a meal. (Patient not taking: Reported on 04/24/2016) 60 tablet 0  . zolpidem (AMBIEN) 5 MG tablet Take 1 tablet (5 mg total) by mouth at bedtime as needed for sleep. (Patient not taking: Reported on 04/24/2016) 30 tablet 2   No current facility-administered medications for this visit.     PHYSICAL EXAMINATION: ECOG PERFORMANCE STATUS: 1 - Symptomatic but completely ambulatory  Vitals:   04/24/16 1138 04/24/16 1142  BP: (!) 149/81   Pulse: (!) 141 (!) 124  Resp: 18   Temp: 99 F (37.2 C)    Filed Weights   04/24/16 1138  Weight: 156 lb 1.6 oz (70.8 kg)    GENERAL:alert, no distress and comfortable SKIN: skin color, texture, turgor are normal, no rashes or significant lesions EYES: normal, Conjunctiva are pink and non-injected, sclera clear OROPHARYNX:no exudate, no erythema and lips, buccal mucosa, and tongue normal  NECK: supple, thyroid normal size, non-tender, without nodularity LYMPH:  no palpable lymphadenopathy in the cervical, axillary or inguinal LUNGS: clear to auscultation and percussion with normal breathing effort HEART: regular rate & rhythm and no murmurs and no lower extremity edema ABDOMEN:abdomen soft, non-tender and normal bowel sounds Musculoskeletal:no cyanosis of digits and no clubbing  NEURO: alert & oriented x 3 with fluent speech, no focal motor/sensory deficits  LABORATORY DATA:  I have reviewed the data as listed    Component Value Date/Time   NA 142 04/24/2016 1048   K 3.4 (L) 04/24/2016 1048   CL 103 03/14/2016 1219   CO2 20 (L) 04/24/2016 1048   GLUCOSE 103 04/24/2016 1048   BUN 12.3 04/24/2016 1048   CREATININE 0.7 04/24/2016 1048   CALCIUM 9.5 04/24/2016 1048   PROT 6.4 04/24/2016 1048   ALBUMIN 2.3 (  L) 04/24/2016 1048   AST 9 04/24/2016 1048   ALT 10  04/24/2016 1048   ALKPHOS 88 04/24/2016 1048   BILITOT 0.31 04/24/2016 1048   GFRNONAA >60 03/14/2016 1219   GFRAA >60 03/14/2016 1219    No results found for: SPEP, UPEP  Lab Results  Component Value Date   WBC 16.2 (H) 04/24/2016   NEUTROABS 14.0 (H) 04/24/2016   HGB 8.7 (L) 04/24/2016   HCT 26.6 (L) 04/24/2016   MCV 76.2 (L) 04/24/2016   PLT 334 04/24/2016      Chemistry      Component Value Date/Time   NA 142 04/24/2016 1048   K 3.4 (L) 04/24/2016 1048   CL 103 03/14/2016 1219   CO2 20 (L) 04/24/2016 1048   BUN 12.3 04/24/2016 1048   CREATININE 0.7 04/24/2016 1048      Component Value Date/Time   CALCIUM 9.5 04/24/2016 1048   ALKPHOS 88 04/24/2016 1048   AST 9 04/24/2016 1048   ALT 10 04/24/2016 1048   BILITOT 0.31 04/24/2016 1048       RADIOGRAPHIC STUDIES: I have personally reviewed the radiological images as listed and agreed with the findings in the report. Mr Jeri Cos Wo Contrast  Result Date: 04/06/2016 CLINICAL DATA:  Continued surveillance metastatic endometrial cancer. New symptoms are not reported. Patient is on Decadron. Patient received SRS to the LEFT cerebellum 10/07/2015. Patient received SRS to the LEFT frontal lesion 02/01/2016. EXAM: MRI HEAD WITHOUT AND WITH CONTRAST TECHNIQUE: Multiplanar, multiecho pulse sequences of the brain and surrounding structures were obtained without and with intravenous contrast. CONTRAST:  22m MULTIHANCE GADOBENATE DIMEGLUMINE 529 MG/ML IV SOLN COMPARISON:  Most recent prior 01/05/2016. FINDINGS: Brain: There is worsening of both metastatic lesions previously identified. LEFT cerebellar metastasis now 24 x 26 x 22 mm. LEFT frontal metastasis, also worse, measures 14 x 16 x 17 mm. Both demonstrate increasing central necrosis. Central susceptibility of the cerebellar lesion is increased, felt to account for peripheral apparent restricted DWI. There is mild mass effect on the fourth ventricle. No obstructive hydrocephalus. No  significant LEFT-to-RIGHT shift. Minimally increased LEFT cerebellar vasogenic edema, and moderately increased LEFT frontal vasogenic edema. No new enhancing lesions are seen. No change cerebral volume.  Prominent perivascular spaces. Vascular: Normal flow voids. Skull and upper cervical spine: Normal marrow signal. Marked empty sella. Sinuses/Orbits: Negative. Other: None. IMPRESSION: Worsening of both metastatic lesions previously identified. See discussion above. Electronically Signed   By: JStaci RighterM.D.   On: 04/06/2016 12:40    ASSESSMENT & PLAN:  [place] She has improved overall since she started taking medications as directed. She tolerated increased dose Megace well at 80 mg twice a day. I plan to repeat imaging study in 2 weeks when I see her back to assess response to treatment  Malignant neoplasm metastatic to lung ([facility] She is not symptomatic from lung metastasis She complain of fatigue likely due to anemia  Metastasis to brain ([facility] I review her recent CT scan of the head which show possible worsening lesion in the brain. MRI show mild disease worsening She completed dexamethasone taper and have no symptoms of neurological deficit.  Leg DVT (deep venous thromboembolism), chronic, left ([facility]) The prior diagnosis of blood clot was over a year ago. We discussed extended duration of apixaban for secondary prevention. Due to possible GI bleed, she is on reduced dose of apixaban  Anemia in neoplastic disease She has multifactorial anemia Her hemoglobin is stable after blood  transfusion and intravenous iron I will continue to monitor that closely   Orders Placed This Encounter  Procedures  . CT ABDOMEN PELVIS W CONTRAST    Standing Status:   Future    Standing Expiration Date:   05/29/2017    Order Specific Question:   Reason for exam:    Answer:   staging metastatic endometrial cancer, assess response to Rx    Order Specific Question:   Preferred imaging  location?    Answer:   [facility] CONTRAST    Standing Status:   Future    Standing Expiration Date:   05/29/2017    Order Specific Question:   Reason for exam:    Answer:   staging metastatic endometrial cancer, assess response to Rx    Order Specific Question:   Preferred imaging location?    Answer:   [facility]   All questions were answered. The patient knows to call the clinic with any problems, questions or concerns. No barriers to learning was detected. I spent 15 minutes counseling the patient face to face. The total time spent in the appointment was 20 minutes and more than 50% was on counseling and review of test results     Heath Lark, MD 04/24/2016 11:57 AM

## 2016-04-24 NOTE — Telephone Encounter (Signed)
Gave patient avs report and appointments for May. Central radiology will call re scan. °

## 2016-04-24 NOTE — Assessment & Plan Note (Signed)
She has multifactorial anemia Her hemoglobin is stable after blood transfusion and intravenous iron I will continue to monitor that closely

## 2016-04-24 NOTE — Assessment & Plan Note (Signed)
She is not symptomatic from lung metastasis She complain of fatigue likely due to anemia

## 2016-04-25 ENCOUNTER — Telehealth: Payer: Self-pay

## 2016-04-25 NOTE — Telephone Encounter (Signed)
Patient called to say she needed a Rx for Megace called into her pharmacy for Megace. She only has 7 left and they told her she cannot fill it until May.

## 2016-04-26 ENCOUNTER — Other Ambulatory Visit: Payer: Self-pay | Admitting: Hematology and Oncology

## 2016-04-26 DIAGNOSIS — C541 Malignant neoplasm of endometrium: Secondary | ICD-10-CM

## 2016-04-26 MED ORDER — MEGESTROL ACETATE 40 MG PO TABS: 80.0000 mg | ORAL_TABLET | Freq: Two times a day (BID) | ORAL | 9 refills | Status: AC

## 2016-04-26 MED FILL — MEGESTROL 40 MG TABLET: 40 | 15 days supply | Qty: 60 | Fill #0

## 2016-04-26 NOTE — Telephone Encounter (Signed)
I sent a new script to [place] Her old prescription was lower dose and hence refusal from pharmacy to refill sooner Please explain to daughter Dirk Dress might fill it

## 2016-04-26 NOTE — Telephone Encounter (Signed)
Given below message.

## 2016-04-27 ENCOUNTER — Ambulatory Visit (INDEPENDENT_AMBULATORY_CARE_PROVIDER_SITE_OTHER): Payer: Self-pay | Admitting: Family Medicine

## 2016-04-27 ENCOUNTER — Encounter: Payer: Self-pay | Admitting: Family Medicine

## 2016-04-27 VITALS — BP 126/72 | HR 127 | Temp 98.3°F | Ht 62.0 in | Wt 156.8 lb

## 2016-04-27 DIAGNOSIS — Z23 Encounter for immunization: Secondary | ICD-10-CM

## 2016-04-27 DIAGNOSIS — Z114 Encounter for screening for human immunodeficiency virus [HIV]: Secondary | ICD-10-CM

## 2016-04-27 DIAGNOSIS — Z1159 Encounter for screening for other viral diseases: Secondary | ICD-10-CM

## 2016-04-27 NOTE — Progress Notes (Signed)
   Subjective:   Patient ID: [name]    DOB: 1955/06/08, 61 y.o. female   MRN: [id number]  CC:  Follow up health maintenance   HPI: [name] is a 61 y.o. female who presents to clinic today to follow up on health maintenance issues.    These were previously discussed however patient had wanted to make a follow-up visit to address them.  Patient wants to know if she is due for a Pap.    She is otherwise doing well and has no additional concerns.    ROS: Denies fevers, chills, abdominal pain, shortness of breath.    Sharon: Pertinent past medical, surgical, family, and social history were reviewed and updated as appropriate. Smoking status reviewed.  Medications reviewed.  Objective:   BP 126/72   Pulse (!) 127   Temp 98.3 F (36.8 C) (Oral)   Ht 5\' 2"  (1.575 m)   Wt 156 lb 12.8 oz (71.1 kg)   SpO2 97%   BMI 28.68 kg/m  Vitals and nursing note reviewed.  General: 61 yo F, well nourished, sitting in wheelchair, family at bedside  HEENT: NCAT, MMM, o/p clear  Neck: supple CV: RRR no MRG  Lungs: CTA b/l with normal WOB  Abdomen: soft, NTND, +bs Skin: warm, dry  Assessment & Plan:   61 yo F presents to address health maintenance issues.  Present with her family members.  No additional concerns at this visit.  Of note, patient does not have insurance however family agrees to payment out of pocket and ordering tests today.    Health Maintenance: -Hep C screening today -HIV screen -Tdap administered -Discussed with patient that she will not need additional Paps given she has had a total hysterectomy.   Orders Placed This Encounter  Procedures  . Tdap vaccine greater than or equal to 7yo IM  . HIV antibody  . Hepatitis C antibody    Lisa Kim, MD [facility], PGY-1 05/03/2016 10:20 PM

## 2016-04-27 NOTE — Patient Instructions (Addendum)
It was nice seeing you and your family again today! You were seen in clinic for follow up on your health maintenance issues and we performed a HIV and Hepatitis C screening test today.  I will follow up with you with the results of these tests for you.   In addition, you received a tetanus shot today that you were due for.  We also addressed that you were due for a Pap smear however since you have had your uterus removed, we do not need to test for this any longer.   Please call clinic with any questions.   Be well,  Lovenia Kim, MD

## 2016-04-28 LAB — HIV ANTIBODY (ROUTINE TESTING W REFLEX): HIV Screen 4th Generation wRfx: NONREACTIVE

## 2016-04-28 LAB — HEPATITIS C ANTIBODY: Hep C Virus Ab: <0.1 s/co ratio (ref 0.0–0.9)

## 2016-04-30 ENCOUNTER — Encounter: Payer: Self-pay | Admitting: Hematology and Oncology

## 2016-05-01 ENCOUNTER — Ambulatory Visit (HOSPITAL_COMMUNITY)
Admission: RE | Admit: 2016-05-01 | Discharge: 2016-05-01 | Disposition: A | Discharge status: Home / Self Care | Payer: Self-pay | Source: Ambulatory Visit | Attending: Hematology and Oncology | Admitting: Hematology and Oncology

## 2016-05-08 ENCOUNTER — Other Ambulatory Visit: Payer: Self-pay

## 2016-05-08 ENCOUNTER — Ambulatory Visit (HOSPITAL_COMMUNITY)
Admission: RE | Admit: 2016-05-08 | Discharge: 2016-05-08 | Disposition: A | Discharge status: Home / Self Care | Payer: Self-pay | Source: Ambulatory Visit | Attending: Hematology and Oncology | Admitting: Hematology and Oncology

## 2016-05-08 ENCOUNTER — Ambulatory Visit (HOSPITAL_BASED_OUTPATIENT_CLINIC_OR_DEPARTMENT_OTHER): Payer: Self-pay

## 2016-05-08 DIAGNOSIS — R600 Localized edema: Secondary | ICD-10-CM | POA: Insufficient documentation

## 2016-05-08 DIAGNOSIS — N200 Calculus of kidney: Secondary | ICD-10-CM | POA: Insufficient documentation

## 2016-05-08 DIAGNOSIS — I517 Cardiomegaly: Secondary | ICD-10-CM | POA: Insufficient documentation

## 2016-05-08 DIAGNOSIS — C7800 Secondary malignant neoplasm of unspecified lung: Secondary | ICD-10-CM | POA: Insufficient documentation

## 2016-05-08 DIAGNOSIS — D630 Anemia in neoplastic disease: Secondary | ICD-10-CM

## 2016-05-08 DIAGNOSIS — R918 Other nonspecific abnormal finding of lung field: Secondary | ICD-10-CM | POA: Insufficient documentation

## 2016-05-08 DIAGNOSIS — C541 Malignant neoplasm of endometrium: Secondary | ICD-10-CM | POA: Insufficient documentation

## 2016-05-08 DIAGNOSIS — I700 Atherosclerosis of aorta: Secondary | ICD-10-CM | POA: Insufficient documentation

## 2016-05-08 DIAGNOSIS — J90 Pleural effusion, not elsewhere classified: Secondary | ICD-10-CM | POA: Insufficient documentation

## 2016-05-08 DIAGNOSIS — R590 Localized enlarged lymph nodes: Secondary | ICD-10-CM | POA: Insufficient documentation

## 2016-05-08 LAB — COMPREHENSIVE METABOLIC PANEL
ALT: 6 U/L (ref 0–55)
ANION GAP: 10 meq/L (ref 3–11)
AST: 8 U/L (ref 5–34)
Albumin: 2.3 g/dL — ABNORMAL LOW (ref 3.5–5.0)
Alkaline Phosphatase: 86 U/L (ref 40–150)
BILIRUBIN TOTAL: 0.29 mg/dL (ref 0.20–1.20)
BUN: 12.4 mg/dL (ref 7.0–26.0)
CALCIUM: 10 mg/dL (ref 8.4–10.4)
CHLORIDE: 106 meq/L (ref 98–109)
CO2: 23 mEq/L (ref 22–29)
CREATININE: 0.7 mg/dL (ref 0.6–1.1)
Glucose: 121 mg/dl (ref 70–140)
Potassium: 3.1 mEq/L — ABNORMAL LOW (ref 3.5–5.1)
Sodium: 139 mEq/L (ref 136–145)
Total Protein: 6.7 g/dL (ref 6.4–8.3)

## 2016-05-08 LAB — CBC WITH DIFFERENTIAL/PLATELET
BASO%: 0.5 % (ref 0.0–2.0)
Basophils Absolute: 0.1 10*3/uL (ref 0.0–0.1)
EOS%: 0.5 % (ref 0.0–7.0)
Eosinophils Absolute: 0.1 10*3/uL (ref 0.0–0.5)
HEMATOCRIT: 26.8 % — AB (ref 34.8–46.6)
HGB: 8.7 g/dL — ABNORMAL LOW (ref 11.6–15.9)
LYMPH#: 0.7 10*3/uL — AB (ref 0.9–3.3)
LYMPH%: 4.8 % — AB (ref 14.0–49.7)
MCH: 24.9 pg — ABNORMAL LOW (ref 25.1–34.0)
MCHC: 32.4 g/dL (ref 31.5–36.0)
MCV: 76.6 fL — AB (ref 79.5–101.0)
MONO#: 1.7 10*3/uL — AB (ref 0.1–0.9)
MONO%: 11.7 % (ref 0.0–14.0)
NEUT%: 82.5 % — ABNORMAL HIGH (ref 38.4–76.8)
NEUTROS ABS: 11.9 10*3/uL — AB (ref 1.5–6.5)
PLATELETS: 757 10*3/uL — AB (ref 145–400)
RBC: 3.49 10*6/uL — ABNORMAL LOW (ref 3.70–5.45)
RDW: 22.5 % — AB (ref 11.2–14.5)
WBC: 14.5 10*3/uL — AB (ref 3.9–10.3)

## 2016-05-08 MED ORDER — SODIUM CHLORIDE 0.9% FLUSH
10.0000 mL | Freq: Once | INTRAVENOUS | Status: AC
  Administered 2016-05-08: 10 mL
  Filled 2016-05-08: qty 10

## 2016-05-08 MED ORDER — HEPARIN SOD (PORK) LOCK FLUSH 100 UNIT/ML IV SOLN
INTRAVENOUS | Status: AC
  Filled 2016-05-08: qty 5

## 2016-05-08 MED ORDER — IOPAMIDOL (ISOVUE-300) INJECTION 61%
INTRAVENOUS | Status: AC
  Administered 2016-05-08: 100 mL
  Filled 2016-05-08: qty 100

## 2016-05-08 MED ORDER — HEPARIN SOD (PORK) LOCK FLUSH 100 UNIT/ML IV SOLN: 500.0000 [IU] | Freq: Once | INTRAVENOUS | Status: DC

## 2016-05-08 NOTE — Patient Instructions (Signed)

## 2016-05-09 ENCOUNTER — Ambulatory Visit (HOSPITAL_BASED_OUTPATIENT_CLINIC_OR_DEPARTMENT_OTHER): Payer: Self-pay | Admitting: Hematology and Oncology

## 2016-05-09 ENCOUNTER — Encounter: Payer: Self-pay | Admitting: Hematology and Oncology

## 2016-05-09 DIAGNOSIS — G893 Neoplasm related pain (acute) (chronic): Secondary | ICD-10-CM

## 2016-05-09 DIAGNOSIS — E440 Moderate protein-calorie malnutrition: Secondary | ICD-10-CM | POA: Insufficient documentation

## 2016-05-09 DIAGNOSIS — C563 Malignant neoplasm of bilateral ovaries: Secondary | ICD-10-CM

## 2016-05-09 DIAGNOSIS — Z7189 Other specified counseling: Secondary | ICD-10-CM

## 2016-05-09 DIAGNOSIS — C7931 Secondary malignant neoplasm of brain: Secondary | ICD-10-CM

## 2016-05-09 DIAGNOSIS — C7800 Secondary malignant neoplasm of unspecified lung: Secondary | ICD-10-CM

## 2016-05-09 DIAGNOSIS — C562 Malignant neoplasm of left ovary: Secondary | ICD-10-CM

## 2016-05-09 DIAGNOSIS — C561 Malignant neoplasm of right ovary: Secondary | ICD-10-CM

## 2016-05-09 DIAGNOSIS — D630 Anemia in neoplastic disease: Secondary | ICD-10-CM

## 2016-05-09 LAB — CA 125: CANCER ANTIGEN (CA) 125: 470.9 U/mL — AB (ref 0.0–38.1)

## 2016-05-09 MED ORDER — PREDNISONE 20 MG PO TABS: 20.0000 mg | ORAL_TABLET | Freq: Every day | ORAL | 1 refills | Status: AC

## 2016-05-09 NOTE — Assessment & Plan Note (Signed)
She is not symptomatic from lung metastasis She complain of fatigue likely due to anemia

## 2016-05-09 NOTE — Assessment & Plan Note (Signed)
I review her recent CT scan of the head which show possible worsening lesion in the brain. MRI show mild disease worsening She completed dexamethasone taper and have no symptoms of neurological deficit.

## 2016-05-09 NOTE — Assessment & Plan Note (Signed)
Despite maximum dose of Megace, she continues to have poor appetite. I recommend addition of steroid treatment

## 2016-05-09 NOTE — Assessment & Plan Note (Signed)
Corrected serum calcium is high. Recommend trial of steroids. If it does not improve, she may need admission to the hospital for management of hypercalcemia

## 2016-05-09 NOTE — Assessment & Plan Note (Signed)
She has multifactorial anemia Her hemoglobin is stable after blood transfusion and intravenous iron I will continue to monitor that closely

## 2016-05-09 NOTE — Progress Notes (Signed)
[place] OFFICE PROGRESS NOTE  Patient Care Team: Lovenia Kim, MD as PCP - General  SUMMARY OF ONCOLOGIC HISTORY:   Ovarian cancer, bilateral ([place])   02/02/2008 Imaging    Uterine fibroid disease with dominant 5.4 cm fundal fibroid. 5 cm complex cystic abnormality of the left ovary/adnexa requiring follow-up ultrasound.  Cystic neoplasm cannot be excluded.  Follow-up is recommended in 6 to 8 weeks. 4 cm complex cyst of the right ovary.  This should also be followed with the left ovarian abnormality      03/03/2008 Imaging    Three uterine fibroids.  Largest measures up to 6.4 cm. Fluid within the endometrial canal with endometrial lining thickness 9.9 mm. Septated fluid containing structure adjacent to the right ovary may represent hydrosalpinx.  Ovarian mass cannot be excluded.  Given this finding, persistent vaginal bleeding and previously described adnexal abnormalities, MR of the pelvis with attention to the uterus ovary is recommended.      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 RIGHT OVARY, EXCISION: - HIGH GRADE SURFACE EPITHELIAL CARCINOMA, 2.7 CM, CONFINED WITHIN RIGHT OVARY. - NO ANGIOLYMPHATIC INVASION IDENTIFIED. - MATURE CYSTIC TERATOMA. - FALLOPIAN TUBE: NO PATHOLOGIC ABNORMALITIES. - PLEASE SEE ONCOLOGY TEMPLATE  2. 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 - ANGIOLYMPHATIC INVASION PRESENT. - LEIOMYOMATA. - CERVIX: BENIGN SQUAMOUS MUCOSA AND ENDOCERVICAL MUCOSA, NO DYSPLASIA OR MALIGNANCY  IDENTIFIED.  LEFT OVARY: - HIGH GRADE SURFACE EPITHELIAL CARCINOMA, 5.5 CM, CAPSULE RUPTURED; NO ANGIOLYMPHATIC INVASION SEEN. - FALLOPIAN TUBAL TISSUE WITH PARATUBAL CYST. - PLEASE SEE ONCOLOGY TEMPLATE.  3. OMENTUM, RESECTION: NEGATIVE FOR MALIGNANCY.      03/18/2008 Pathology Results    PERITONEAL WASHING (THIN PREPARATION AND CELL BLOCK): POSITIVE FOR ADENOCARCINOMA      09/21/2008 PET scan    FDG avid ascending colonic intraluminal mass, worrisome for colonic neoplasm.  The adjacent right pericolic gutter soft tissue nodule does not demonstrate F D G avidity, possibly due to its small size, but remains worrisome due to its proximity to the intraluminal mass and history of ovarian cancer. No abnormal F D G uptake in the neck, chest, or abdomen. Mild asymmetric soft tissue prominence of the anterior right parapharyngeal soft tissues, amenable to direct visualization.       12/23/2008 Miscellaneous    The patient completed external beam radiation therapy and high-dose rate brachytherapy as part of management of her stage IC endometrioid adenocarcinoma.  The patient completed her radiation treatments on 12/23/2008       02/12/2015 Relapse/Recurrence    Patient reported right inguinal mass and LLE pain when she presented to ED on 02-12-15, all due to disease relapse      02/12/2015 Imaging    Status post hysterectomy. 8.5 x 7.1 cm necrotic right inguinal node, corresponding to the palpable abnormality, suspicious for nodal metastasis. Additional retroperitoneal/ left pelvic nodal metastases, as above. 6.1 cm low-density lesion in the left psoas muscle, favored to reflect tumor, less likely infection. Secondary involvement of the left L4 and L5 vertebral bodies. Associated acute deep venous thrombosis involving the left external  iliac vein and extending into the left lower extremity, likely secondary to extrinsic compression by pelvic tumor. Pulmonary nodules/metastases at the lung  bases, measuring up to 2.4 cm in the left lower lobe.      02/14/2015 Procedure    Aspirate and core biopsy performed of enlarged and necrotic right inguinal lymph node. Aspirated fluid was sent for cytologic analysis. Solid tissue was sent for histopathologic analysis.       02/14/2015 Pathology Results    Diagnosis Lymph node, needle/core biopsy, right inguinal METASTATIC CARCINOMA WITH PREDOMINANT COAGULATIVE NECROSIS CONSISTENT WITH GYN PRIMARY Microscopic Comment The biopsy consist of metastatic carcinoma with coagulative necrosis. The neoplasm stains positive for ck7 and ER, supporting these are GYN primary.      02/14/2015 Pathology Results    Diagnosis LYMPH NODE, FINE NEEDLE ASPIRATION, RIGHT INGUINAL(SPECIMEN 1 OF 1 COLLECTED 02/14/15): MALIGNANT CELLS CONSISTENT WITH METASTATIC ADENOCARCINOMA.      02/14/2015 Tumor Marker    Patient's tumor was tested for the following markers: CA125 Results of the tumor marker test revealed 642.9      02/24/2015 - 05/26/2015 Chemotherapy    She received a few cycles of carboplatin and Taxotere      03/14/2015 Tumor Marker    Patient's tumor was tested for the following markers: CA125 Results of the tumor marker test revealed 427.8      04/12/2015 Procedure    Successful ultrasound-guided aspiration of a total of approximately 180 cc of brown colored fluid from symptomatic necrotic right inguinal lymph node. Note, if the cystic component of this lymph node reoccurs in the come symptomatic, the patient may benefit from a surgical resection      04/21/2015 Tumor Marker    Patient's tumor was tested for the following markers: CA125 Results of the tumor marker test revealed 111.9      05/12/2015 Pathology Results    Debridement, right inguinal - ADENOCARCINOMA ASSOCIATED WITH HEMATOMA. - SEE MICROSCOPIC DESCRIPTION. Microscopic Comment Within the dermis and subcutaneous tissue there is a hemorrhagic cavity surrounded by granulation  tissue and abundant hemosiderin deposition consistent with hematoma. In the adjacent connective tissue there are cystic glands with papillary protrusions consistent with adenocarcinoma. The morphologic features are most consistent with the previous ovarian carcinoma from 2010 ([id number]). (JDP:gt, 05/13/15)       05/12/2015 Pathology Results    CYTOLOGY FLUID: RIGHT INGUINAL FLUID(SPECIMEN 1 OF 1 COLLECTED 05/12/15):Marland Kitchen ABUNDANT BLOOD AND RARE ATYPICAL CELLS SUSPICIOUS FOR CARCINOMA      05/12/2015 Surgery    Procedure: Incision and drainage of right inguinal fluid collection with capsulectomy of seroma cavity Right inguinal wound VAC placement (10 x 9 x 6 cm) Operative Findings: Liquefied thick maroon colored fluid without odor. Well-defined capsule around the fluid collection. After resection of capsule there was adequate tissue overlying the vessels, therefore mobilization of sartorius was not necessary.      05/23/2015 Tumor Marker    Patient's tumor was tested for the following markers: CA125 Results of the tumor marker test revealed 48      06/14/2015 Imaging    Significant interval decrease in size of bilateral pulmonary nodules and masses as well as retroperitoneal, pelvic and inguinal adenopathy. Mass within the left upper lobe may represent metastatic disease or primary pulmonary malignancy. Interval increase in size of large cystic mass within the left hilum. Interval sclerosis of the L4 and L5 vertebral body metastasis.       06/14/2015 Genetic Testing    Genetics testing negative Breast Ovarian  and gyn panel by [facility] 06-14-15.      07/07/2015 Tumor Marker    Patient's tumor was tested for the following markers: CA125 Results of the tumor marker test revealed 86.6      08/01/2015 PET scan    Interval progression, as described above. Dominant 7.8 cm left upper lobe mass abuts the mediastinum. Additional left upper lobe/ left lower lobe masses and thoracic lymphadenopathy.  Additional retroperitoneal/left pelvic nodal metastases. Postsurgical changes in the right inguinal region with mild residual hypermetabolism.      08/10/2015 Procedure    Successful placement of a right IJ approach Power Port with ultrasound and fluoroscopic guidance. The catheter is ready for use.      08/11/2015 Miscellaneous    She received 1 dose of gemzar      08/18/2015 Tumor Marker    Patient's tumor was tested for the following markers: CA125 Results of the tumor marker test revealed 171      08/19/2015 - 02/17/2016 Anti-estrogen oral therapy    She was placed on Letrozole, stopped due to progression of disease      08/24/2015 - 09/15/2015 Radiation Therapy    08/24/2015-09/15/2015: Left Lung, 37.5 Gy in 15 fractions by Dr. Sondra Come.       09/23/2015 Imaging    Decrease in some of the pulmonary metastatic lesions as described above. The mass lesion abutting the mediastinum on the left has decreased in size from the prior exam. New mild pericardial effusion. Increase in size in a pelvic lymphadenopathy in the iliac chain. The left psoas mass and periaortic lymph node as well as changes in the right inguinal region are relatively stable as described. No new focal abnormality is seen.      09/28/2015 Imaging    Cerebellar edema centrally on the left with suggestion of an underlying 3.5 cm left cerebellar mass. Mass effect on the fourth ventricle but no ventriculomegaly or loss of basilar cistern patency. 2. Cerebellar metastatic disease favored in this clinical setting. Follow-up Brain MRI without and with contrast would characterize further. 3. No other acute or metastatic intracranial abnormality identified.      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 Vasogenic edema with mild mass effect on the fourth ventricle, no obstructive hydrocephalus. Otherwise negative MRI of the head for age.       10/07/2015 Imaging    Known left cerebellar metastasis  with vasogenic edema. Subjectively less mass effect on the adjacent fourth ventricle, albeit minimal.      10/11/2015 - 10/11/2015 Radiation Therapy    10/07/15: PTV1 Lt Cerebellar 23 mm was treated to 18 Gy in one fraction by Dr. Tammi Klippel.      10/31/2015 Tumor Marker    Patient's tumor was tested for the following markers: CA125 Results of the tumor marker test revealed 82.3      12/12/2015 Tumor Marker    Patient's tumor was tested for the following markers: CA125 Results of the tumor marker test revealed 92.5      01/03/2016 Imaging    Exam detail is diminished secondary to motion artifact. This predominantly affects the segmental pulmonary arteries of the left lung. Within this limitation no evidence for acute pulmonary embolus noted. 2. Increase in volume of left pericardial. 3. Mixed response to therapy with respect to mediastinal adenopathy and pulmonary lesions. The dominant left upper lobe lung mass is decreased in size in the interval. There is a nodule within the right lower  lobe which is increased in size in the interval. 4. Pre-vascular lymph node is mildly decreased in size in the interval. Large necrotic cystic nodal mass within the AP window region is stable in the interval.      01/05/2016 Imaging    Interval increase in size of left cerebellar metastasis, now measuring 2.4 x 2.6 x 2.0 cm. Similar localized edema and mass effect on the adjacent fourth ventricle. Fourth ventricle remains patent without obstructive hydrocephalus. 2. Interval development of a second 1.3 x 1.3 x 1.1 cm metastatic lesion within the left periventricular white matter of the left frontal lobe. Mild localized edema without significant mass effect.       01/09/2016 Imaging    LV EF: 60% -   65%      01/26/2016 Tumor Marker    Patient's tumor was tested for the following markers: CA125 Results of the tumor marker test revealed 211.9      02/01/2016 - 02/01/2016 Radiation Therapy    Left frontal 13  mm target was treated using 3 Dynamic Conformal Arcs to a prescription dose of 20 Gy.      02/17/2016 -  Anti-estrogen oral therapy    She started taking Megace      03/07/2016 Tumor Marker    Patient's tumor was tested for the following markers: CA125 Results of the tumor marker test revealed 304      03/14/2016 Imaging    Ct head: Increased size of a left frontal lobe metastatic lesion with increased edema within the surrounding white matter. Mild 3 mm shift to the right. 2. Difficult visualization of known left cerebellar mass due to the mass being more hypodense on today's study. There appears to be increased edema within the left cerebellum and right cerebellum and therefore suspect that the cerebellar lesion is also increased. There is mass effect on fourth ventricle and increased mass effect on the pons and midbrain. 3. Cannot rule out subtle edema within the right frontal lobe white matter.      04/03/2016 Tumor Marker    Patient's tumor was tested for the following markers: CA125 Results of the tumor marker test revealed 303      04/06/2016 Imaging    Worsening of both metastatic lesions previously identified.       05/08/2016 Imaging    1. Significant worsening of tumor burden in the chest, abdomen, and pelvis ; with new and enlarging right pulmonary nodules ; significant enlargement of the left pleural effusion with only a small amount of aerated lung on the left; increase in size of the dominant left paramediastinal and hilar mass; marked enlargement of the left pelvic sidewall mass which not tracks cephalad along the psoas muscle and out through the inguinal ring; and worsening retroperitoneal and pelvic adenopathy. 2. Presacral edema increased from prior. 3. Aortic Atherosclerosis (ICD10-I70.0). 4. Nonobstructive right nephrolithiasis. 5. Mild cardiomegaly.      05/08/2016 Tumor Marker    Patient's tumor was tested for the following markers: CA125 Results of the tumor marker  test revealed 470.9       INTERVAL HISTORY: Please see below for problem oriented charting. She returns with multiple family members.  Her appetite remains poor. She denies pain. She complain of excessive fatigue. The patient denies any recent signs or symptoms of bleeding such as spontaneous epistaxis, hematuria or hematochezia.  REVIEW OF SYSTEMS:   Constitutional: Denies fevers, chills or abnormal weight loss Eyes: Denies blurriness of vision Ears, nose, mouth, throat, and face: Denies  mucositis or sore throat Respiratory: Denies cough, dyspnea or wheezes Cardiovascular: Denies palpitation, chest discomfort or lower extremity swelling Gastrointestinal:  Denies nausea, heartburn or change in bowel habits Skin: Denies abnormal skin rashes Lymphatics: Denies new lymphadenopathy or easy bruising Neurological:Denies numbness, tingling or new weaknesses Behavioral/Psych: Mood is stable, no new changes  All other systems were reviewed with the patient and are negative.  I have reviewed the past medical history, past surgical history, social history and family history with the patient and they are unchanged from previous note.  ALLERGIES:  is allergic to morphine and related and dilaudid [hydromorphone hcl].  MEDICATIONS:  Current Outpatient Prescriptions  Medication Sig Dispense Refill  . amLODipine (NORVASC) 5 MG tablet TAKE 1 TABLET BY MOUTH TWICE A DAY 60 tablet 6  . apixaban (ELIQUIS) 5 MG TABS tablet Take 1 tablet (5 mg total) by mouth 2 (two) times daily. 180 tablet 1  . benzonatate (TESSALON) 100 MG capsule TK ONE C PO  BID PRN for cough  0  . [employer] VITAMIN E 400 units capsule Take 400 Units by mouth 2 (two) times daily.  3  . lidocaine-prilocaine (EMLA) cream Apply to Porta-cath 1-2 hrs prior to access as directed. 30 g 1  . LORazepam (ATIVAN) 0.5 MG tablet Take 1 tablet (0.5 mg total) by mouth every 6 (six) hours as needed (nausea). (Patient taking differently: Take 0.5 mg by  mouth every 6 (six) hours as needed (for nausea). ) 30 tablet 0  . megestrol (MEGACE) 40 MG tablet TAKE 1 TABLET BY MOUTH TWICE A DAY 60 tablet 1  . megestrol (MEGACE) 40 MG tablet Take 2 tablets (80 mg total) by mouth 2 (two) times daily. 60 tablet 9  . methadone (DOLOPHINE) 10 MG tablet Take 1 tablet (10 mg total) by mouth every 12 (twelve) hours. 60 tablet 0  . Multiple Vitamins-Minerals (ADULT GUMMY) CHEW Chew 2 each by mouth daily.    . pantoprazole (PROTONIX) 40 MG tablet Take 1 tablet (40 mg total) by mouth daily. 30 tablet 11  . pentoxifylline (TRENTAL) 400 MG CR tablet Take 400 mg by mouth 2 (two) times daily with a meal.  3  . polyethylene glycol (MIRALAX / GLYCOLAX) packet Take 17 g by mouth daily.     Marland Kitchen PROAIR HFA 108 (90 Base) MCG/ACT inhaler Inhale 1-2 puffs into the lungs every 4 (four) hours as needed for wheezing or shortness of breath.   3  . senna (SENOKOT) 8.6 MG tablet Take 1 tablet by mouth daily.    . VOLTAREN 1 % GEL APPLY TO AFFECTED AREA EVERY 8 TO 12 HOURS AS NEEDED FOR PAIN  4  . zolpidem (AMBIEN) 5 MG tablet Take 1 tablet (5 mg total) by mouth at bedtime as needed for sleep. 30 tablet 2  . dexamethasone (DECADRON) 4 MG tablet Take 1 tablet (4 mg total) by mouth 2 (two) times daily with a meal. (Patient not taking: Reported on 04/24/2016) 60 tablet 0  . predniSONE (DELTASONE) 20 MG tablet Take 1 tablet (20 mg total) by mouth daily with breakfast. 30 tablet 1   No current facility-administered medications for this visit.     PHYSICAL EXAMINATION: ECOG PERFORMANCE STATUS: 2 - Symptomatic, <50% confined to bed  Vitals:   05/09/16 1039  BP: 133/71  Pulse: (!) 125  Resp: 18  Temp: 99.2 F (37.3 C)   Filed Weights   05/09/16 1039  Weight: 158 lb (71.7 kg)    GENERAL:alert, no distress and  comfortable SKIN: skin color, texture, turgor are normal, no rashes or significant lesions EYES: normal, Conjunctiva are pink and non-injected, sclera  clear Musculoskeletal:no cyanosis of digits and no clubbing  NEURO: alert & oriented x 3 with fluent speech, no focal motor/sensory deficits  LABORATORY DATA:  I have reviewed the data as listed    Component Value Date/Time   NA 139 05/08/2016 1227   K 3.1 (L) 05/08/2016 1227   CL 103 03/14/2016 1219   CO2 23 05/08/2016 1227   GLUCOSE 121 05/08/2016 1227   BUN 12.4 05/08/2016 1227   CREATININE 0.7 05/08/2016 1227   CALCIUM 10.0 05/08/2016 1227   PROT 6.7 05/08/2016 1227   ALBUMIN 2.3 (L) 05/08/2016 1227   AST 8 05/08/2016 1227   ALT 6 05/08/2016 1227   ALKPHOS 86 05/08/2016 1227   BILITOT 0.29 05/08/2016 1227   GFRNONAA >60 03/14/2016 1219   GFRAA >60 03/14/2016 1219    No results found for: SPEP, UPEP  Lab Results  Component Value Date   WBC 14.5 (H) 05/08/2016   NEUTROABS 11.9 (H) 05/08/2016   HGB 8.7 (L) 05/08/2016   HCT 26.8 (L) 05/08/2016   MCV 76.6 (L) 05/08/2016   PLT 757 (H) 05/08/2016      Chemistry      Component Value Date/Time   NA 139 05/08/2016 1227   K 3.1 (L) 05/08/2016 1227   CL 103 03/14/2016 1219   CO2 23 05/08/2016 1227   BUN 12.4 05/08/2016 1227   CREATININE 0.7 05/08/2016 1227      Component Value Date/Time   CALCIUM 10.0 05/08/2016 1227   ALKPHOS 86 05/08/2016 1227   AST 8 05/08/2016 1227   ALT 6 05/08/2016 1227   BILITOT 0.29 05/08/2016 1227       RADIOGRAPHIC STUDIES: I have personally reviewed the radiological images as listed and agreed with the findings in the report. Ct Chest W Contrast  Result Date: 05/08/2016 CLINICAL DATA:  Restaging of endometrial cancer original diagnosis 2017, metastatic disease to the lung, brain, an lymph nodes in 2017. Ongoing chemotherapy. Shortness of breath with cough for 1 week. EXAM: CT CHEST, ABDOMEN, AND PELVIS WITH CONTRAST TECHNIQUE: Multidetector CT imaging of the chest, abdomen and pelvis was performed following the standard protocol during bolus administration of intravenous contrast.  CONTRAST:  170m ISOVUE-300 IOPAMIDOL (ISOVUE-300) INJECTION 61% COMPARISON:  Multiple exams, including 01/03/2016 FINDINGS: CT CHEST FINDINGS Cardiovascular: Atherosclerotic calcification of the aortic arch. Attenuated size of the left pulmonary artery is is primarily likely due to hypoaeration causing hypoperfusion. Today' s exam was not protocol to assess for pulmonary embolus but I do not see an obvious large filling defect in the central pulmonary vasculature. Mild cardiomegaly. The prior free-flowing pericardial effusion has essentially resolved. Mediastinum/Nodes: Cystic masses in the pericardial space with some of the more solid component of mass difficult separate out from the adjacent atelectatic portion of the left upper lobe. Overall region of cystic and solid tumor involving the left hilum and upper mediastinum measures approximately 9.6 by 7.5 by 9.8 cm, and appears worsened compared to prior. Worsening paratracheal, right hilar, and right infrahilar adenopathy. An index right upper paratracheal node measuring 1.7 cm in short axis on image 7/2 was previously 0.8 cm in short axis. An index right hilar node measuring 2.4 cm in short axis on image 23/2 was formerly 1.0 cm in short axis. Lungs/Pleura: The left pleural effusion has considerably and enlarged and is now large in size. Nonspecific for transudative versus exudative  effusion, although clearly there is concern for malignant effusion. Only small portions of the left upper lobar aerated and the left lower lobe is also almost completely collapsed. Scattered new and enlarging nodules in the right lung. For example a superior segment right lower lobe nodule on image 57/5 measures 1.6 by 1.5 cm, formerly a 1.5 by 1.3 cm. A new right upper lobe nodule on image 38/5 measures 5 by 6 mm. Musculoskeletal: Unremarkable CT ABDOMEN PELVIS FINDINGS Hepatobiliary: Unremarkable Pancreas: Unremarkable Spleen: Unremarkable Adrenals/Urinary Tract: Adrenal glands  unremarkable. 7 mm right mid kidney nonobstructive triangular-shaped renal calculus, similar to prior. Several benign-appearing right renal cysts, similar to prior. Stomach/Bowel: The cecum sits in the pelvis just above the urinary bladder. Normal appendix. No dilated bowel. The bowel is displaced by the left pelvic sidewall mass. Vascular/Lymphatic: Aortoiliac atherosclerotic vascular disease. Right retrocaval node 1.4 cm in short axis image 63/2, previously this node was not present. Right external iliac node 1.7 cm in short axis on image 103/2, previously not visible. Oval-shaped complex collection in the right inguinal region measuring 2.6 by 3.0 cm may represent an enlarged lymph node or a small complex fluid collection. The previously seen mass along the anterior portion of the operator internus has markedly increased in size. This currently has a large exophytic complex component with enhancing margins extending medially and displacing pelvic bowel, and has enhancing soft tissue components invading both the iloipsoas and operator internus muscles. The mass also extends through the inguinal ring. Total size is approximately 7.8 by 5.6 by 15.3 cm (volume = 350 cm^3), and was previously about 3.7 by 2.6 by 4.1 cm (volume = 21 cm^3). The mass tracks cephalad along the deep portion of the psoas muscle. Reproductive: Hysterectomy.  Adnexa not well seen. Other: Presacral edema is increased from prior. Musculoskeletal: Lower lumbar spondylosis and degenerative disc disease without observed impingement. IMPRESSION: 1. Significant worsening of tumor burden in the chest, abdomen, and pelvis ; with new and enlarging right pulmonary nodules ; significant enlargement of the left pleural effusion with only a small amount of aerated lung on the left; increase in size of the dominant left paramediastinal and hilar mass; marked enlargement of the left pelvic sidewall mass which not tracks cephalad along the psoas muscle and  out through the inguinal ring; and worsening retroperitoneal and pelvic adenopathy. 2. Presacral edema increased from prior. 3.  Aortic Atherosclerosis (ICD10-I70.0). 4. Nonobstructive right nephrolithiasis. 5. Mild cardiomegaly. Electronically Signed   By: Van Clines M.D.   On: 05/08/2016 16:59   Ct Abdomen Pelvis W Contrast  Result Date: 05/08/2016 CLINICAL DATA:  Restaging of endometrial cancer original diagnosis 2017, metastatic disease to the lung, brain, an lymph nodes in 2017. Ongoing chemotherapy. Shortness of breath with cough for 1 week. EXAM: CT CHEST, ABDOMEN, AND PELVIS WITH CONTRAST TECHNIQUE: Multidetector CT imaging of the chest, abdomen and pelvis was performed following the standard protocol during bolus administration of intravenous contrast. CONTRAST:  113m ISOVUE-300 IOPAMIDOL (ISOVUE-300) INJECTION 61% COMPARISON:  Multiple exams, including 01/03/2016 FINDINGS: CT CHEST FINDINGS Cardiovascular: Atherosclerotic calcification of the aortic arch. Attenuated size of the left pulmonary artery is is primarily likely due to hypoaeration causing hypoperfusion. Today' s exam was not protocol to assess for pulmonary embolus but I do not see an obvious large filling defect in the central pulmonary vasculature. Mild cardiomegaly. The prior free-flowing pericardial effusion has essentially resolved. Mediastinum/Nodes: Cystic masses in the pericardial space with some of the more solid component of mass difficult separate  out from the adjacent atelectatic portion of the left upper lobe. Overall region of cystic and solid tumor involving the left hilum and upper mediastinum measures approximately 9.6 by 7.5 by 9.8 cm, and appears worsened compared to prior. Worsening paratracheal, right hilar, and right infrahilar adenopathy. An index right upper paratracheal node measuring 1.7 cm in short axis on image 7/2 was previously 0.8 cm in short axis. An index right hilar node measuring 2.4 cm in short  axis on image 23/2 was formerly 1.0 cm in short axis. Lungs/Pleura: The left pleural effusion has considerably and enlarged and is now large in size. Nonspecific for transudative versus exudative effusion, although clearly there is concern for malignant effusion. Only small portions of the left upper lobar aerated and the left lower lobe is also almost completely collapsed. Scattered new and enlarging nodules in the right lung. For example a superior segment right lower lobe nodule on image 57/5 measures 1.6 by 1.5 cm, formerly a 1.5 by 1.3 cm. A new right upper lobe nodule on image 38/5 measures 5 by 6 mm. Musculoskeletal: Unremarkable CT ABDOMEN PELVIS FINDINGS Hepatobiliary: Unremarkable Pancreas: Unremarkable Spleen: Unremarkable Adrenals/Urinary Tract: Adrenal glands unremarkable. 7 mm right mid kidney nonobstructive triangular-shaped renal calculus, similar to prior. Several benign-appearing right renal cysts, similar to prior. Stomach/Bowel: The cecum sits in the pelvis just above the urinary bladder. Normal appendix. No dilated bowel. The bowel is displaced by the left pelvic sidewall mass. Vascular/Lymphatic: Aortoiliac atherosclerotic vascular disease. Right retrocaval node 1.4 cm in short axis image 63/2, previously this node was not present. Right external iliac node 1.7 cm in short axis on image 103/2, previously not visible. Oval-shaped complex collection in the right inguinal region measuring 2.6 by 3.0 cm may represent an enlarged lymph node or a small complex fluid collection. The previously seen mass along the anterior portion of the operator internus has markedly increased in size. This currently has a large exophytic complex component with enhancing margins extending medially and displacing pelvic bowel, and has enhancing soft tissue components invading both the iloipsoas and operator internus muscles. The mass also extends through the inguinal ring. Total size is approximately 7.8 by 5.6 by 15.3  cm (volume = 350 cm^3), and was previously about 3.7 by 2.6 by 4.1 cm (volume = 21 cm^3). The mass tracks cephalad along the deep portion of the psoas muscle. Reproductive: Hysterectomy.  Adnexa not well seen. Other: Presacral edema is increased from prior. Musculoskeletal: Lower lumbar spondylosis and degenerative disc disease without observed impingement. IMPRESSION: 1. Significant worsening of tumor burden in the chest, abdomen, and pelvis ; with new and enlarging right pulmonary nodules ; significant enlargement of the left pleural effusion with only a small amount of aerated lung on the left; increase in size of the dominant left paramediastinal and hilar mass; marked enlargement of the left pelvic sidewall mass which not tracks cephalad along the psoas muscle and out through the inguinal ring; and worsening retroperitoneal and pelvic adenopathy. 2. Presacral edema increased from prior. 3.  Aortic Atherosclerosis (ICD10-I70.0). 4. Nonobstructive right nephrolithiasis. 5. Mild cardiomegaly. Electronically Signed   By: Van Clines M.D.   On: 05/08/2016 16:59    ASSESSMENT & PLAN:  Ovarian cancer, bilateral ([place]) I reviewed imaging study, blood test results and her prior pathology report with patient and family members. The patient has component of high-grade ovarian cancer along with endometrial cancer. Her original diagnosis was in 2010.  She relapsed in 2017. Systemic chemotherapy has not been able  to halt disease progression. She has significant disease burden on imaging study.  She also have evidence of malignant hypercalcemia. I have a very long discussion with patient and family members. Treatment would be strictly palliative in nature. Her performance status is poor. We discussed potential options including gemcitabine, pemetrexed, weekly Abraxane or Taxol or combination with cisplatin. The patient is undecided. Her family members will call me in 2 days to discuss options.   Anemia  in neoplastic disease She has multifactorial anemia Her hemoglobin is stable after blood transfusion and intravenous iron I will continue to monitor that closely  Cancer associated pain She has adequate pain control with current pain medicine regimen.   Continue the same  Malignant neoplasm metastatic to lung [facility]) She is not symptomatic from lung metastasis She complain of fatigue likely due to anemia  Metastasis to brain [facility]) I review her recent CT scan of the head which show possible worsening lesion in the brain. MRI show mild disease worsening She completed dexamethasone taper and have no symptoms of neurological deficit.  Protein-calorie malnutrition, moderate ([place]) Despite maximum dose of Megace, she continues to have poor appetite. I recommend addition of steroid treatment  Hypercalcemia of malignancy Corrected serum calcium is high. Recommend trial of steroids. If it does not improve, she may need admission to the hospital for management of hypercalcemia  Goals of care, counseling/discussion The patient has previously refused discussion about goals of care. Family members and the patient is aware she has stage IV, incurable disease. She declines palliative care/hospice and desire more treatment   No orders of the defined types were placed in this encounter.  All questions were answered. The patient knows to call the clinic with any problems, questions or concerns. No barriers to learning was detected. I spent 40 minutes counseling the patient face to face. The total time spent in the appointment was 55 minutes and more than 50% was on counseling and review of test results     Heath Lark, MD 05/09/2016 1:50 PM

## 2016-05-09 NOTE — Assessment & Plan Note (Addendum)
She has adequate pain control with current pain medicine regimen.   Continue the same

## 2016-05-09 NOTE — Assessment & Plan Note (Signed)
I reviewed imaging study, blood test results and her prior pathology report with patient and family members. The patient has component of high-grade ovarian cancer along with endometrial cancer. Her original diagnosis was in 2010.  She relapsed in 2017. Systemic chemotherapy has not been able to halt disease progression. She has significant disease burden on imaging study.  She also have evidence of malignant hypercalcemia. I have a very long discussion with patient and family members. Treatment would be strictly palliative in nature. Her performance status is poor. We discussed potential options including gemcitabine, pemetrexed, weekly Abraxane or Taxol or combination with cisplatin. The patient is undecided. Her family members will call me in 2 days to discuss options.

## 2016-05-09 NOTE — Assessment & Plan Note (Signed)
The patient has previously refused discussion about goals of care. Family members and the patient is aware she has stage IV, incurable disease. She declines palliative care/hospice and desire more treatment

## 2016-05-11 ENCOUNTER — Telehealth: Payer: Self-pay

## 2016-05-11 NOTE — Telephone Encounter (Signed)
Called with below message. Patient does not want hospice. She would like to continue with follow-up and transfusions only.

## 2016-05-11 NOTE — Telephone Encounter (Signed)
Does she want hospice? Or continue follow-up and transfusion only as needed?

## 2016-05-11 NOTE — Telephone Encounter (Signed)
Patient left message that she wanted the nurse to call her back. Called patient back, she wants to tell Dr. Alvy Bimler that she does not want to get chemotherapy.

## 2016-05-25 ENCOUNTER — Other Ambulatory Visit: Payer: Self-pay | Admitting: Radiation Therapy

## 2016-05-25 DIAGNOSIS — C7931 Secondary malignant neoplasm of brain: Secondary | ICD-10-CM

## 2016-06-25 NOTE — Progress Notes (Addendum)
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  Headache-frequency,location:  Fatigue:  Light headiness/Dizziness:    Nausea/vomiting Ataxia(unsteady gait):  Vision (Blurred/double vision,blind spots, and peripheral vsion changes):  Fine motor movement-picking up objects with fingers,holding objects, writing, weakness of lower extremities:  Cognitive changes: Respiratory issues:  Imaging:07-05-16 MRI brain w wo contrast, 03-14-16 CT Head wo contrast Lab: N/A Weight:

## 2016-07-01 DEATH — deceased

## 2016-07-03 ENCOUNTER — Encounter: Payer: Self-pay | Admitting: Radiation Therapy

## 2016-07-03 NOTE — Progress Notes (Signed)
Pt expired. Below is a clip from her obituary.   

## 2016-07-05 ENCOUNTER — Ambulatory Visit (HOSPITAL_COMMUNITY): Payer: Self-pay

## 2016-07-07 ENCOUNTER — Other Ambulatory Visit: Payer: Self-pay | Admitting: Nurse Practitioner

## 2016-07-09 ENCOUNTER — Ambulatory Visit: Payer: Self-pay | Admitting: Radiation Oncology

## 2016-07-10 ENCOUNTER — Ambulatory Visit: Admission: RE | Admit: 2016-07-10 | Payer: Self-pay | Source: Ambulatory Visit | Admitting: Urology

## 2017-05-13 IMAGING — CT CT HEAD W/O CM
3 of 5 series · 14 of 47 positions shown, 16 images · non-contrast
Comparison: 01/05/2016, CT head 10/07/2015

CLINICAL DATA: History of endometrial cancer with, recurrent nausea
and vomiting

EXAM:
CT HEAD WITHOUT CONTRAST
TECHNIQUE: Contiguous axial images were obtained from the base of the skull
through the vertex without intravenous contrast.

[Series 4: coronal · coronal · 0.33mm/px · 3 of 77 slices shown]
[im 26/77  brain]
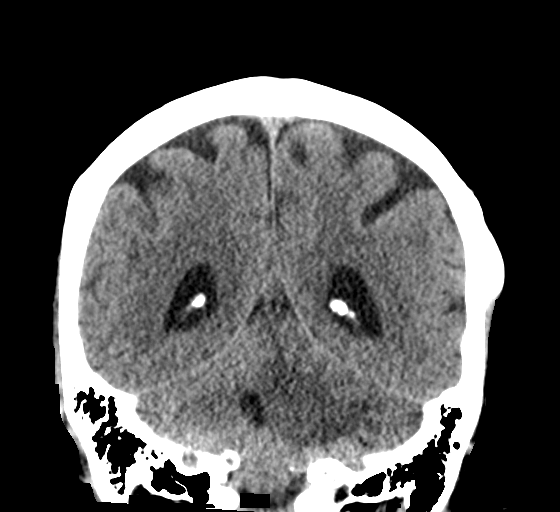
[im 34/77  brain]
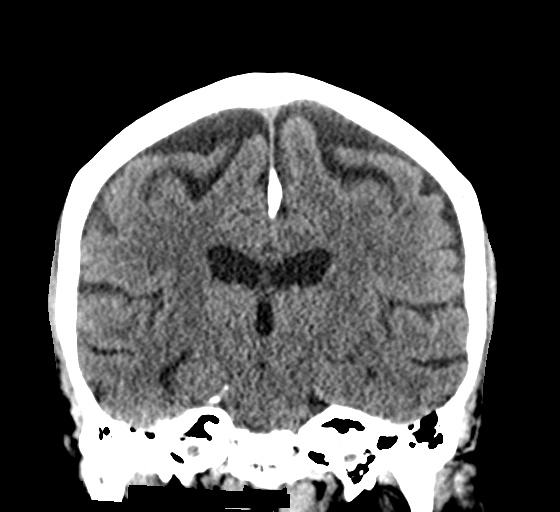
[im 43/77  brain]
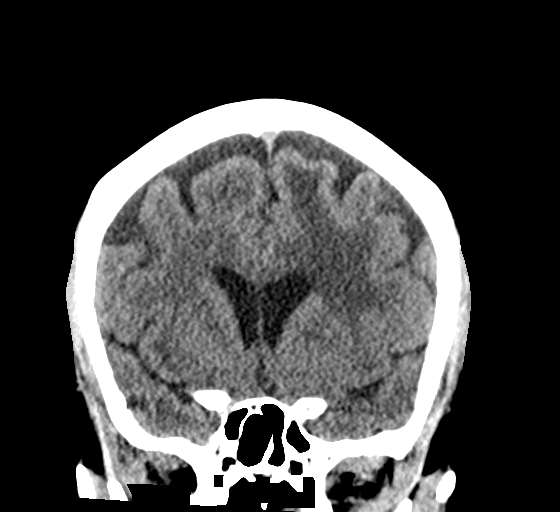

[Series 5: sagittal · sagittal · 0.33mm/px · 3 of 77 slices shown]
[im 31/77  brain]
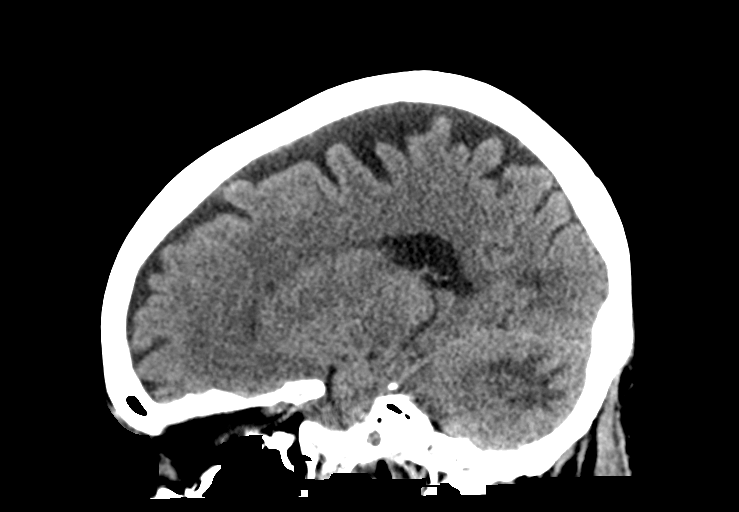
[im 39/77  brain]
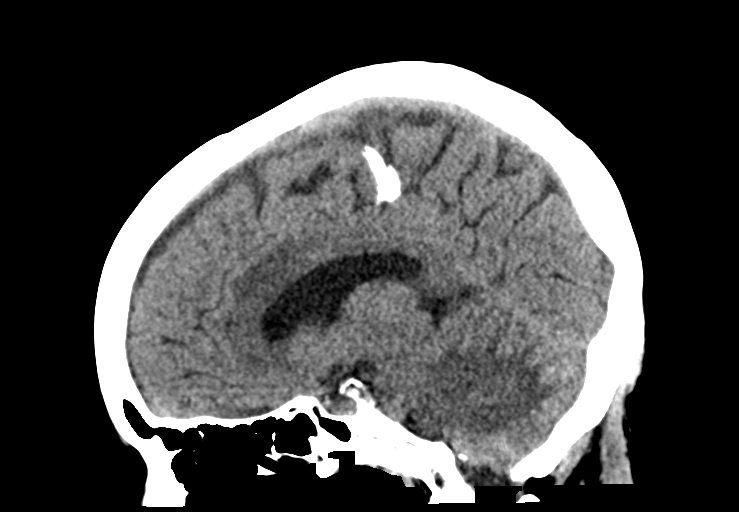
[im 47/77  brain]
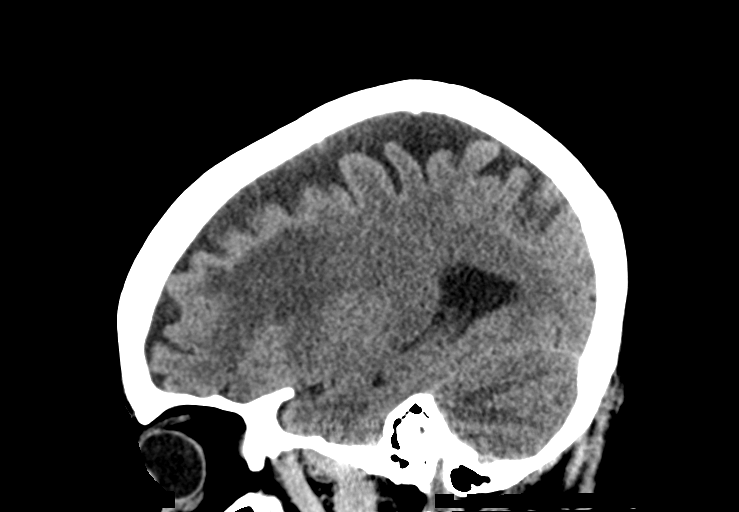

[Series 6: head w/o · axial · non-contrast · 0.45mm/px · z∈[+1550,+1688]mm · 8 of 85 slices shown, 10 images]
[im 8/85  brain]
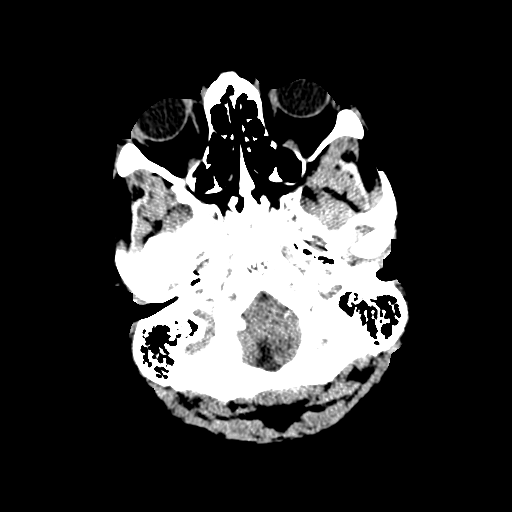
[im 8/85  bone]
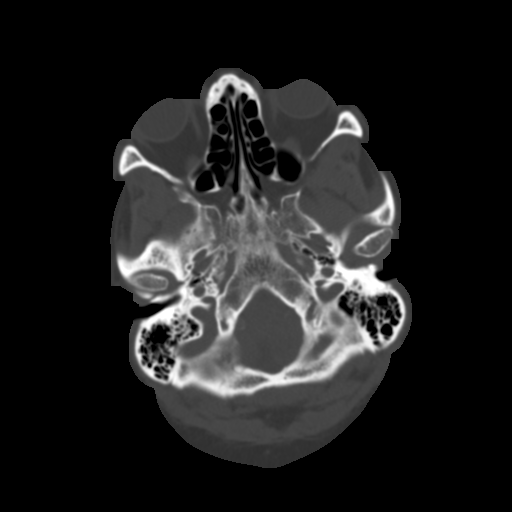
[im 16/85  brain]
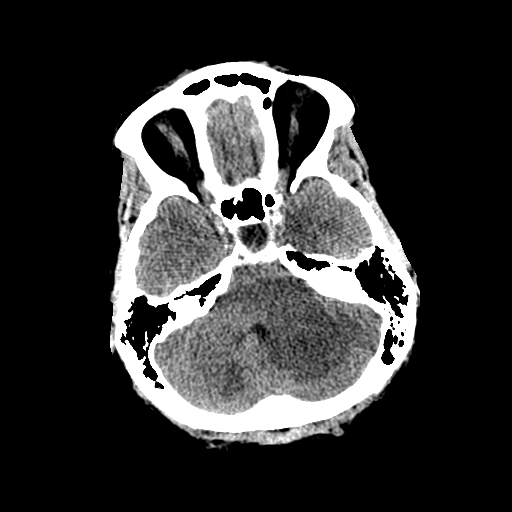
[im 31/85  brain]
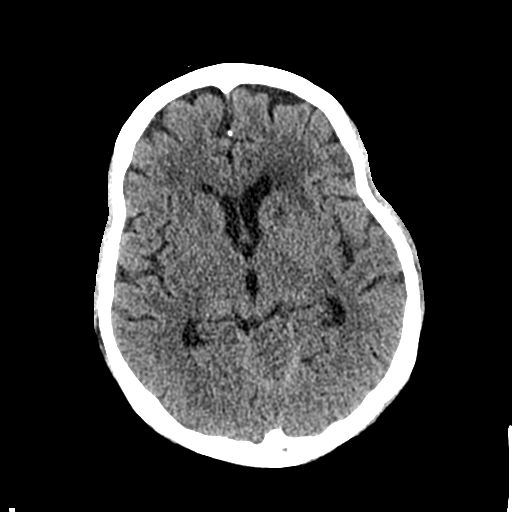
[im 39/85  brain]
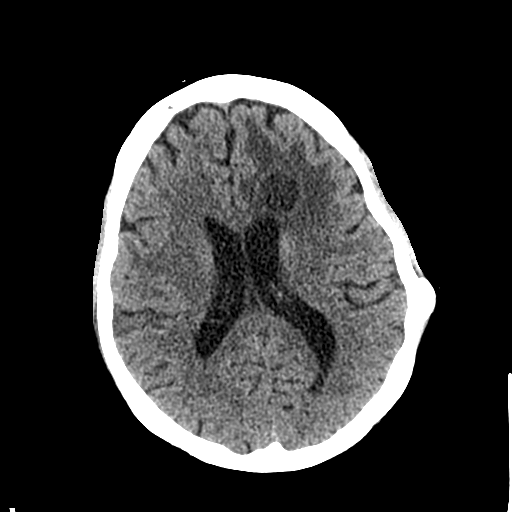
[im 46/85  brain]
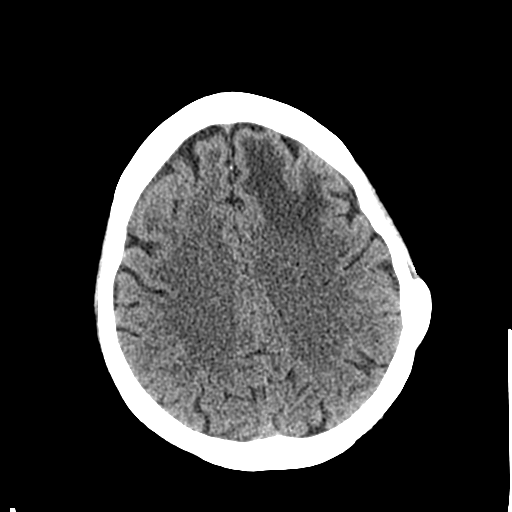
[im 46/85  bone]
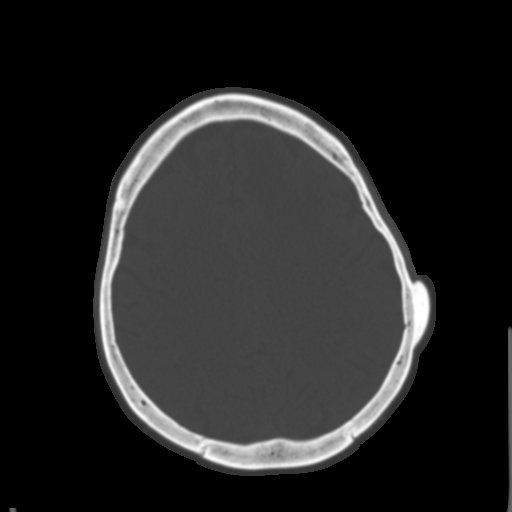
[im 54/85  brain]
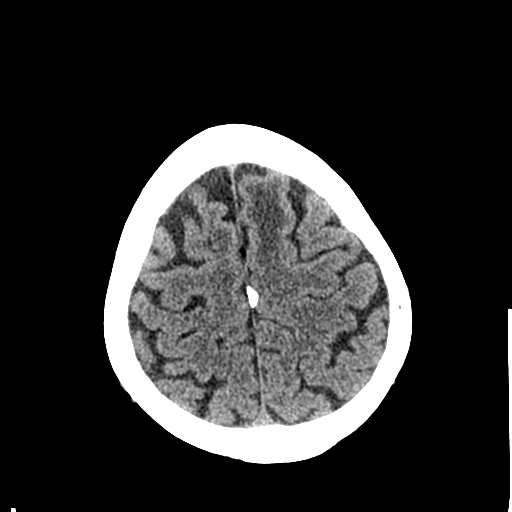
[im 69/85  brain]
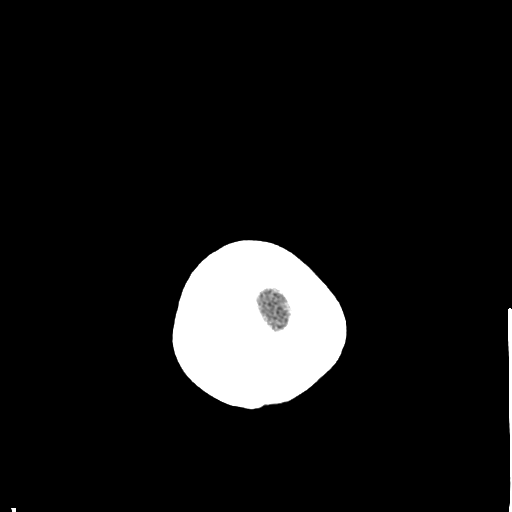
[im 77/85  brain]
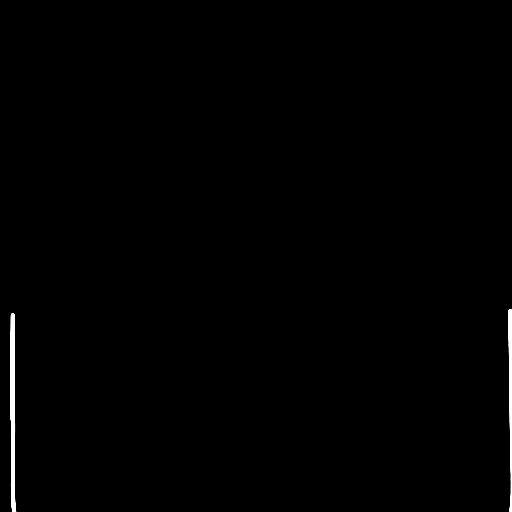

[14 of 47 positions shown; findings below may reference images not displayed]

FINDINGS: Brain: No hemorrhage is visualized. Increased size of a hypodense
mass in the left frontal lobe measuring 1.9 x 1.7 cm, compared with
1.3 x 1.3 cm on prior MRI. Increased low attenuation surrounding the
mass compatible with increased edema. New 3 mm midline shift to the
right. Difficult to exclude subtle focus of low attenuation within
the right frontal lobe white matter. Increased low-attenuation left
basal ganglia.

Diffuse low attenuation within the left cerebellum with increased
edema/ low attenuation now seen in the right cerebellum. Known mass
lesion in the left cerebellum is difficult to measure as it is now
more hypodense, suspect that it is increased in size given increased
edema within the posterior fossa. Mild mass effect on fourth
ventricle. Mass effect on the left posterior pons and midbrain. The
lateral and third ventricles are slightly enlarged compared to
prior.

Vascular: No hyperdense vessels. Vertebral artery calcifications.
Scattered calcifications at the carotid siphons.

Skull: Left parietal sclerotic bone protuberance unchanged. No
fracture.

Sinuses/Orbits: No acute finding.

Other: None
IMPRESSION: 1. Increased size of a left frontal lobe metastatic lesion with
increased edema within the surrounding white matter. Mild 3 mm shift
to the right.
2. Difficult visualization of known left cerebellar mass due to the
mass being more hypodense on today's study. There appears to be
increased edema within the left cerebellum and right cerebellum and
therefore suspect that the cerebellar lesion is also increased.
There is mass effect on fourth ventricle and increased mass effect
on the pons and midbrain.
3. Cannot rule out subtle edema within the right frontal lobe white
matter.
